# Patient Record
Sex: Female | Born: 1980 | Hispanic: No | Marital: Married | Smoking: Current every day smoker
Health system: Southern US, Community
[De-identification: ages and names within clinical notes are randomized; demographics above are authoritative.]

## PROBLEM LIST (undated history)

## (undated) ENCOUNTER — Inpatient Hospital Stay (HOSPITAL_COMMUNITY): Payer: Self-pay

## (undated) DIAGNOSIS — E669 Obesity, unspecified: Secondary | ICD-10-CM

## (undated) DIAGNOSIS — G4730 Sleep apnea, unspecified: Secondary | ICD-10-CM

## (undated) DIAGNOSIS — G44009 Cluster headache syndrome, unspecified, not intractable: Secondary | ICD-10-CM

## (undated) DIAGNOSIS — E559 Vitamin D deficiency, unspecified: Secondary | ICD-10-CM

## (undated) DIAGNOSIS — R7303 Prediabetes: Secondary | ICD-10-CM

## (undated) DIAGNOSIS — I10 Essential (primary) hypertension: Secondary | ICD-10-CM

## (undated) DIAGNOSIS — F419 Anxiety disorder, unspecified: Secondary | ICD-10-CM

## (undated) DIAGNOSIS — M7989 Other specified soft tissue disorders: Secondary | ICD-10-CM

## (undated) DIAGNOSIS — F32A Depression, unspecified: Secondary | ICD-10-CM

## (undated) DIAGNOSIS — K219 Gastro-esophageal reflux disease without esophagitis: Secondary | ICD-10-CM

## (undated) DIAGNOSIS — R0683 Snoring: Secondary | ICD-10-CM

## (undated) DIAGNOSIS — M549 Dorsalgia, unspecified: Secondary | ICD-10-CM

## (undated) DIAGNOSIS — L309 Dermatitis, unspecified: Secondary | ICD-10-CM

## (undated) DIAGNOSIS — E282 Polycystic ovarian syndrome: Secondary | ICD-10-CM

## (undated) DIAGNOSIS — O24419 Gestational diabetes mellitus in pregnancy, unspecified control: Secondary | ICD-10-CM

## (undated) DIAGNOSIS — R0602 Shortness of breath: Secondary | ICD-10-CM

## (undated) DIAGNOSIS — M5430 Sciatica, unspecified side: Secondary | ICD-10-CM

## (undated) DIAGNOSIS — M1990 Unspecified osteoarthritis, unspecified site: Secondary | ICD-10-CM

## (undated) DIAGNOSIS — M2550 Pain in unspecified joint: Secondary | ICD-10-CM

## (undated) DIAGNOSIS — E785 Hyperlipidemia, unspecified: Secondary | ICD-10-CM

## (undated) DIAGNOSIS — R079 Chest pain, unspecified: Secondary | ICD-10-CM

## (undated) DIAGNOSIS — F329 Major depressive disorder, single episode, unspecified: Secondary | ICD-10-CM

## (undated) HISTORY — DX: Vitamin D deficiency, unspecified: E55.9

## (undated) HISTORY — DX: Chest pain, unspecified: R07.9

## (undated) HISTORY — DX: Pain in unspecified joint: M25.50

## (undated) HISTORY — DX: Polycystic ovarian syndrome: E28.2

## (undated) HISTORY — DX: Unspecified osteoarthritis, unspecified site: M19.90

## (undated) HISTORY — DX: Gastro-esophageal reflux disease without esophagitis: K21.9

## (undated) HISTORY — DX: Hyperlipidemia, unspecified: E78.5

## (undated) HISTORY — DX: Essential (primary) hypertension: I10

## (undated) HISTORY — DX: Major depressive disorder, single episode, unspecified: F32.9

## (undated) HISTORY — DX: Depression, unspecified: F32.A

## (undated) HISTORY — DX: Dorsalgia, unspecified: M54.9

## (undated) HISTORY — DX: Obesity, unspecified: E66.9

## (undated) HISTORY — DX: Cluster headache syndrome, unspecified, not intractable: G44.009

## (undated) HISTORY — PX: DENTAL SURGERY: SHX609

## (undated) HISTORY — DX: Gestational diabetes mellitus in pregnancy, unspecified control: O24.419

## (undated) HISTORY — DX: Shortness of breath: R06.02

## (undated) HISTORY — PX: WISDOM TOOTH EXTRACTION: SHX21

## (undated) HISTORY — DX: Sleep apnea, unspecified: G47.30

## (undated) HISTORY — DX: Other specified soft tissue disorders: M79.89

## (undated) HISTORY — DX: Prediabetes: R73.03

## (undated) HISTORY — DX: Snoring: R06.83

## (undated) HISTORY — PX: COLPOSCOPY: SHX161

## (undated) SURGERY — Surgical Case
Anesthesia: *Unknown

---
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
## 2009-10-11 ENCOUNTER — Emergency Department (HOSPITAL_COMMUNITY): Admission: EM | Admit: 2009-10-11 | Discharge: 2009-10-12 | Payer: Self-pay | Admitting: Emergency Medicine

## 2009-10-11 IMAGING — US US OB COMP LESS 14 WK
1 series · 14 of 28 positions shown · non-contrast
Comparison: None.

CLINICAL DATA: 29-year-old female with cramping.  Estimated
gestational age 11 weeks 2 days by LMP.

OBSTETRIC <14 WK US AND TRANSVAGINAL OB US
TECHNIQUE: Both transabdominal and transvaginal ultrasound
examinations were performed for complete evaluation of the
gestation as well as the maternal uterus, adnexal regions, and
pelvic cul-de-sac.  Transvaginal technique was performed to assess
early pregnancy.

[Series 1: us ob comp less 14 wk · 0.23mm/px · 14 of 36 slices shown]
[im 2/36]
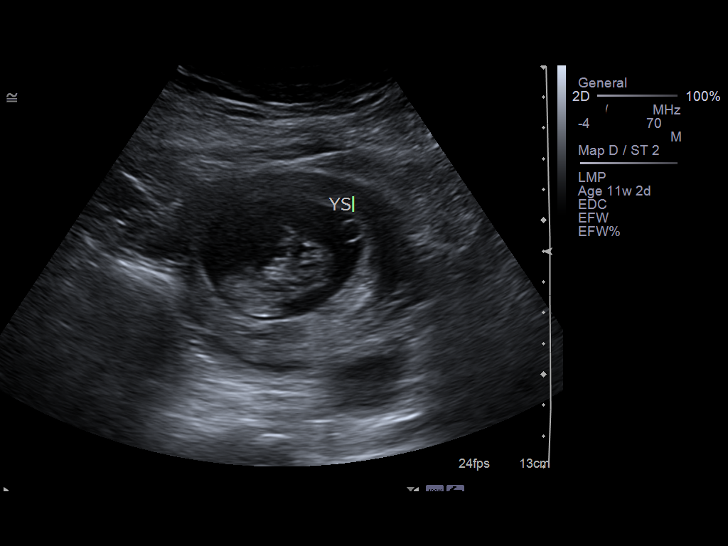
[im 4/36]
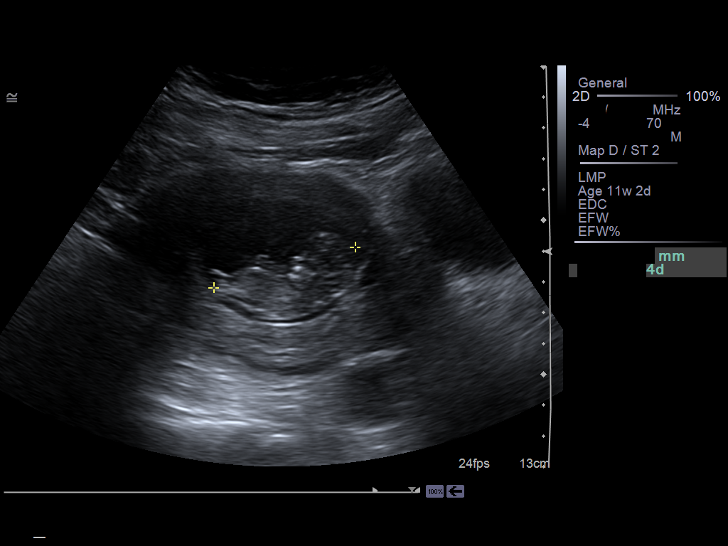
[im 7/36]
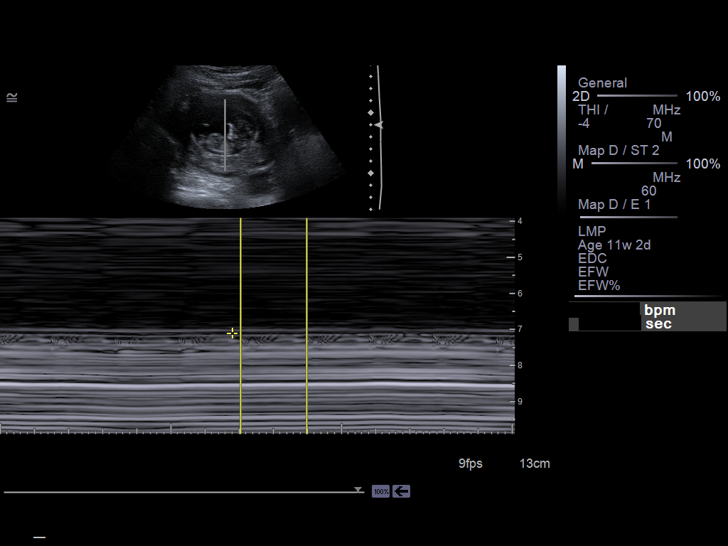
[im 10/36]
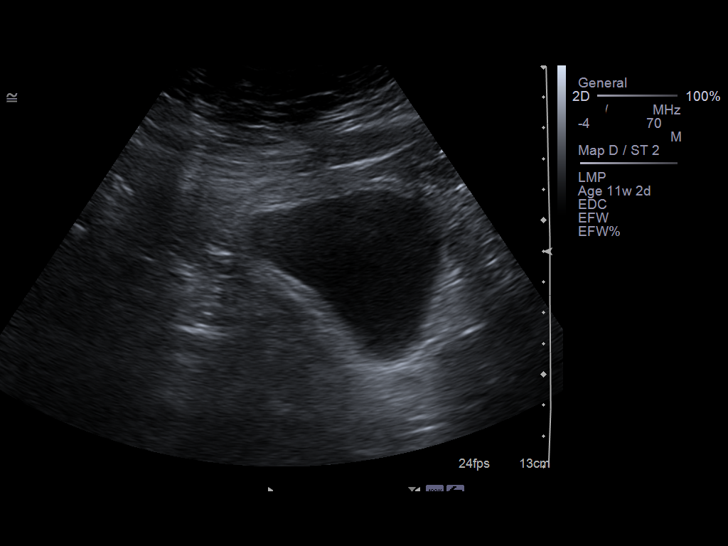
[im 12/36]
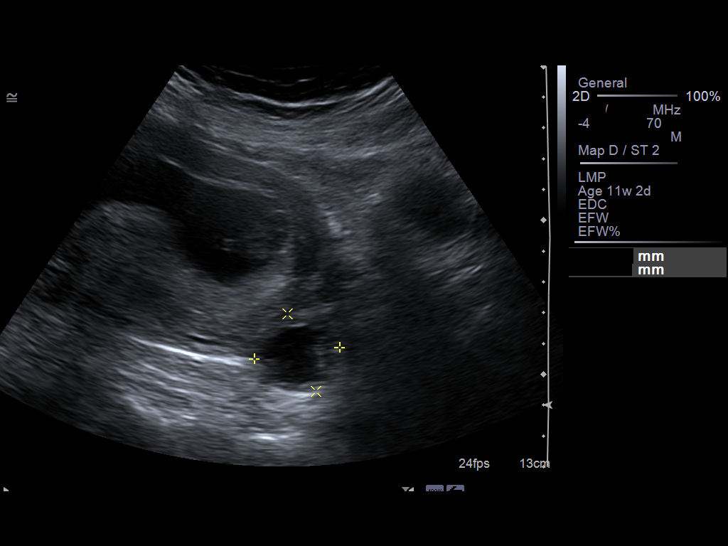
[im 15/36]
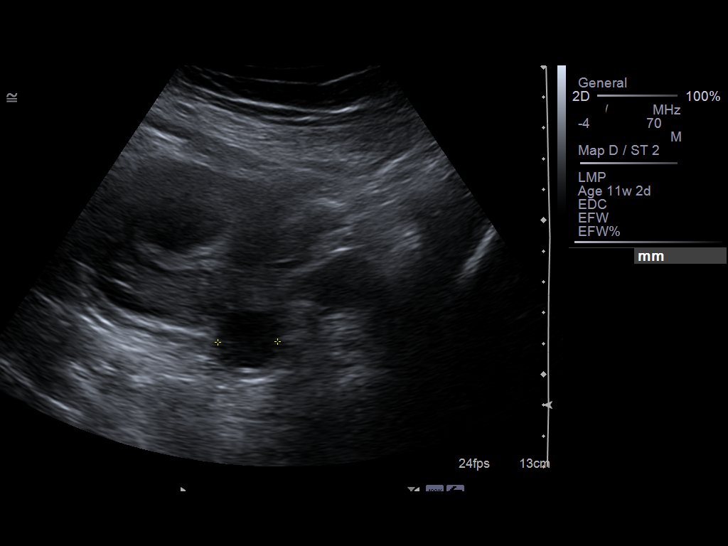
[im 17/36]
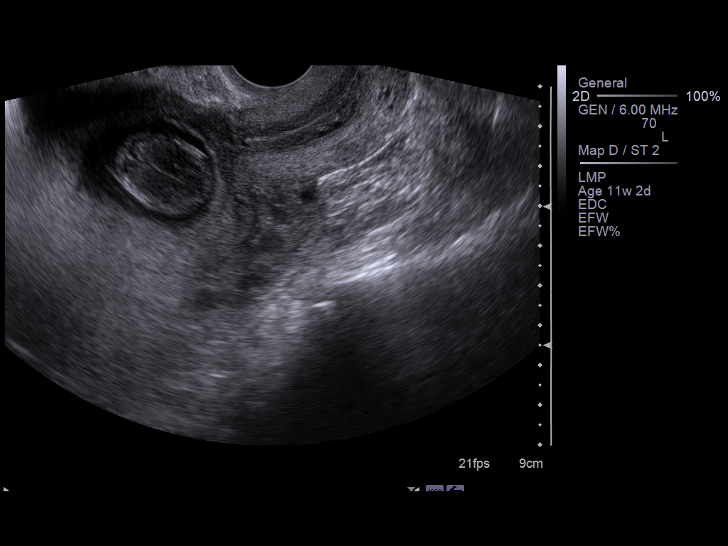
[im 20/36]
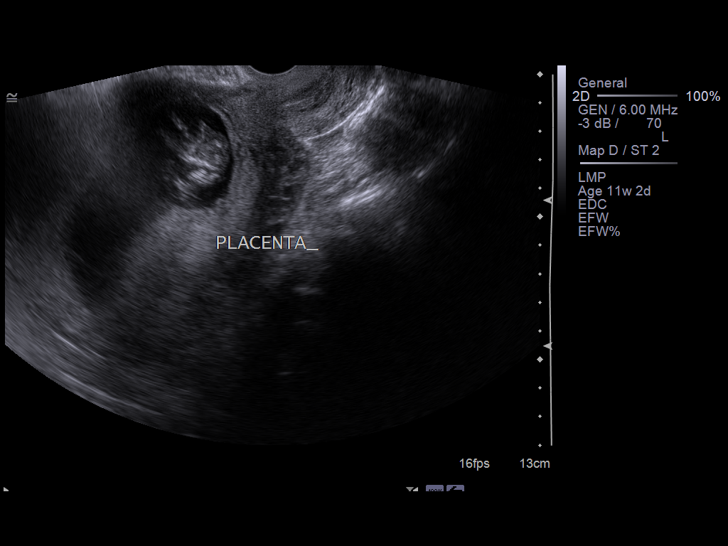
[im 23/36]
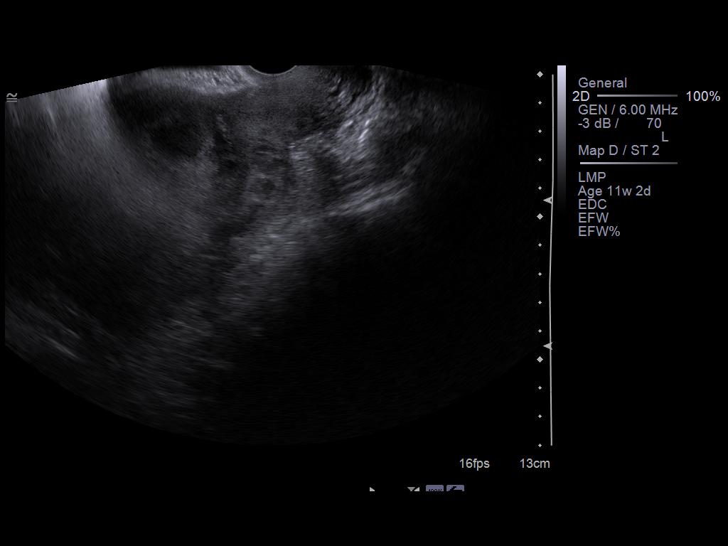
[im 25/36]
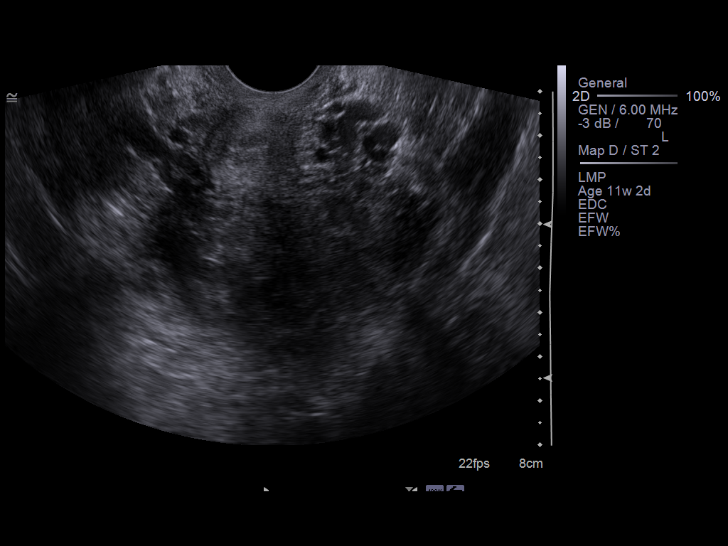
[im 28/36]
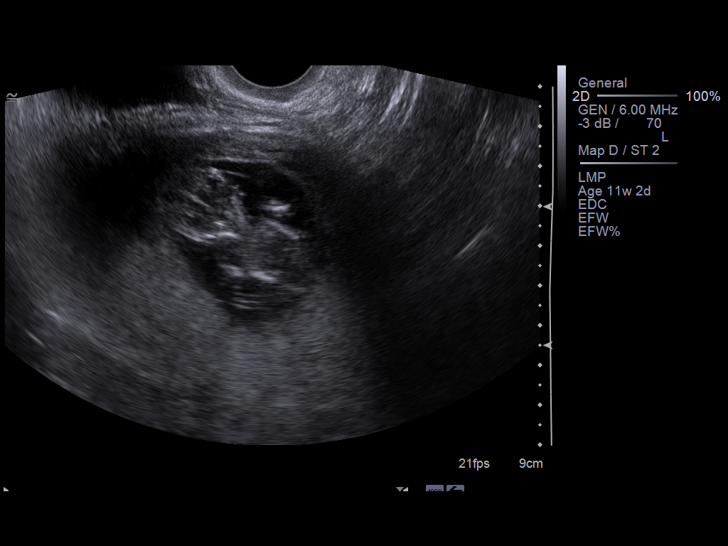
[im 30/36]
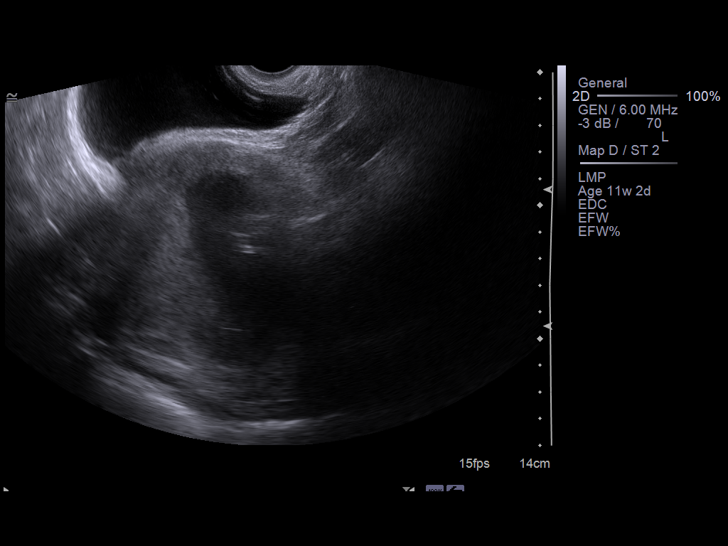
[im 33/36]
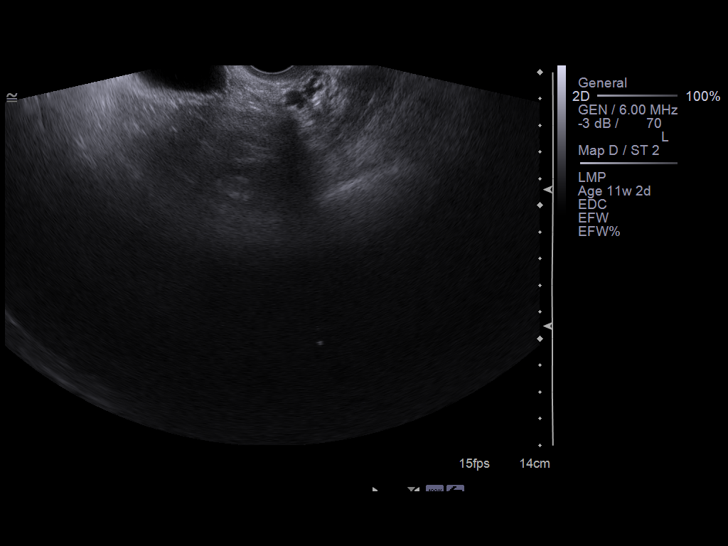
[im 36/36]
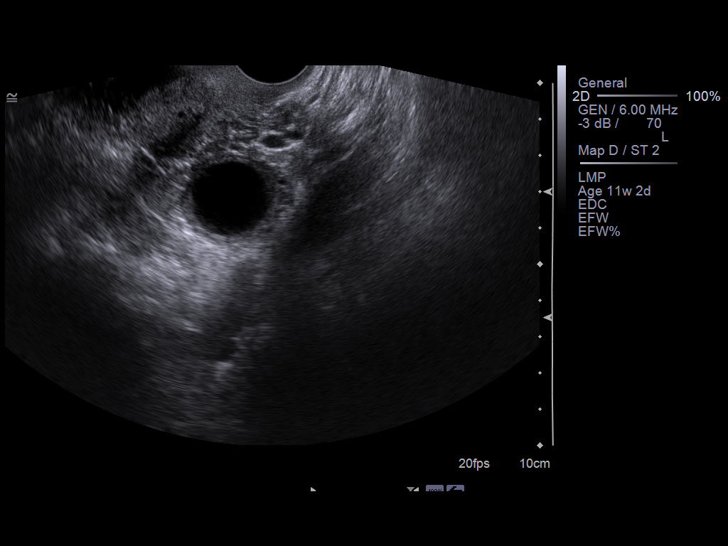

[14 of 28 positions shown; findings below may reference images not displayed]

Intrauterine gestational sac:  Single
Yolk sac: Present
Embryo: Present
Cardiac Activity: Present
Heart Rate: 158 bpm

CRL: 48.6   mm  11   w  4   d

Maternal uterus/adnexae:
No pelvic free fluid.  No subchorionic hemorrhage identified.  Low-
lying placenta.  Left ovary contains a small cyst measuring 20 mm
in diameter.  Left adnexa 28 x 27 x 27 mm.  Right ovary not
visualized despite transabdominal and transvaginal imaging.
IMPRESSION: 1. Appearance strongly suggests placenta previa.
2. Viable singleton intrauterine pregnancy with estimated
gestational age of 11 weeks and 4 days by crown-rump length.

## 2009-12-12 ENCOUNTER — Inpatient Hospital Stay (HOSPITAL_COMMUNITY)
Admission: AD | Admit: 2009-12-12 | Discharge: 2009-12-13 | Payer: Self-pay | Source: Home / Self Care | Admitting: Obstetrics and Gynecology

## 2010-01-13 ENCOUNTER — Ambulatory Visit: Payer: Self-pay | Admitting: Cardiology

## 2010-01-19 ENCOUNTER — Ambulatory Visit: Admission: RE | Admit: 2010-01-19 | Discharge: 2010-01-19 | Payer: Self-pay | Source: Home / Self Care

## 2010-01-19 ENCOUNTER — Encounter: Payer: Self-pay | Admitting: Cardiology

## 2010-01-19 ENCOUNTER — Ambulatory Visit (HOSPITAL_COMMUNITY)
Admission: RE | Admit: 2010-01-19 | Discharge: 2010-01-19 | Payer: Self-pay | Source: Home / Self Care | Attending: Cardiology | Admitting: Cardiology

## 2010-03-15 ENCOUNTER — Encounter: Payer: Medicaid Other | Attending: Obstetrics and Gynecology

## 2010-03-15 DIAGNOSIS — O99810 Abnormal glucose complicating pregnancy: Secondary | ICD-10-CM | POA: Insufficient documentation

## 2010-03-15 DIAGNOSIS — Z713 Dietary counseling and surveillance: Secondary | ICD-10-CM | POA: Insufficient documentation

## 2010-03-21 LAB — URINALYSIS, ROUTINE W REFLEX MICROSCOPIC
Glucose, UA: NEGATIVE mg/dL
Protein, ur: NEGATIVE mg/dL

## 2010-03-21 LAB — CBC
Hemoglobin: 11.8 g/dL — ABNORMAL LOW (ref 12.0–15.0)
MCHC: 34.2 g/dL (ref 30.0–36.0)
Platelets: 209 10*3/uL (ref 150–400)
RDW: 13.9 % (ref 11.5–15.5)

## 2010-03-21 LAB — COMPREHENSIVE METABOLIC PANEL
ALT: 13 U/L (ref 0–35)
AST: 15 U/L (ref 0–37)
Albumin: 3 g/dL — ABNORMAL LOW (ref 3.5–5.2)
CO2: 25 mEq/L (ref 19–32)
Calcium: 8.9 mg/dL (ref 8.4–10.5)
GFR calc Af Amer: >60 mL/min (ref >60)
Sodium: 134 mEq/L — ABNORMAL LOW (ref 135–145)
Total Protein: 5.9 g/dL — ABNORMAL LOW (ref 6.0–8.3)

## 2010-03-21 LAB — TSH: TSH: 4.562 u[IU]/mL — ABNORMAL HIGH (ref 0.350–4.500)

## 2010-03-23 LAB — GC/CHLAMYDIA PROBE AMP, GENITAL
Chlamydia, DNA Probe: NEGATIVE
GC Probe Amp, Genital: NEGATIVE

## 2010-03-23 LAB — URINALYSIS, ROUTINE W REFLEX MICROSCOPIC
Bilirubin Urine: NEGATIVE
Glucose, UA: NEGATIVE mg/dL
Hgb urine dipstick: NEGATIVE
Ketones, ur: NEGATIVE mg/dL
pH: 6 (ref 5.0–8.0)

## 2010-03-23 LAB — WET PREP, GENITAL: Trich, Wet Prep: NONE SEEN

## 2010-04-18 ENCOUNTER — Other Ambulatory Visit (HOSPITAL_COMMUNITY): Payer: Medicaid Other

## 2010-04-20 ENCOUNTER — Encounter (HOSPITAL_COMMUNITY)
Admission: RE | Admit: 2010-04-20 | Discharge: 2010-04-20 | Disposition: A | Discharge status: Home / Self Care | Payer: Medicaid Other | Source: Ambulatory Visit | Attending: Obstetrics and Gynecology | Admitting: Obstetrics and Gynecology

## 2010-04-20 LAB — BASIC METABOLIC PANEL
Chloride: 102 mEq/L (ref 96–112)
GFR calc non Af Amer: >60 mL/min (ref >60)
Glucose, Bld: 90 mg/dL (ref 70–99)
Potassium: 3.8 mEq/L (ref 3.5–5.1)
Sodium: 133 mEq/L — ABNORMAL LOW (ref 135–145)

## 2010-04-20 LAB — SURGICAL PCR SCREEN: Staphylococcus aureus: NEGATIVE

## 2010-04-20 LAB — CBC
MCH: 30.6 pg (ref 26.0–34.0)
MCHC: 32.8 g/dL (ref 30.0–36.0)
MCV: 93.3 fL (ref 78.0–100.0)
RDW: 14.8 % (ref 11.5–15.5)

## 2010-04-20 LAB — RPR: RPR Ser Ql: NONREACTIVE

## 2010-04-25 ENCOUNTER — Inpatient Hospital Stay (HOSPITAL_COMMUNITY)
Admission: RE | Admit: 2010-04-25 | Discharge: 2010-04-28 | DRG: 765 | Disposition: A | Discharge status: Home / Self Care | Payer: Medicaid Other | Source: Ambulatory Visit | Attending: Obstetrics and Gynecology | Admitting: Obstetrics and Gynecology

## 2010-04-25 ENCOUNTER — Inpatient Hospital Stay (HOSPITAL_COMMUNITY)
Admission: AD | Admit: 2010-04-25 | Payer: Medicaid Other | Source: Ambulatory Visit | Admitting: Obstetrics and Gynecology

## 2010-04-25 ENCOUNTER — Other Ambulatory Visit: Payer: Self-pay | Admitting: Obstetrics and Gynecology

## 2010-04-25 DIAGNOSIS — O34219 Maternal care for unspecified type scar from previous cesarean delivery: Principal | ICD-10-CM | POA: Diagnosis present

## 2010-04-25 DIAGNOSIS — O1002 Pre-existing essential hypertension complicating childbirth: Secondary | ICD-10-CM | POA: Diagnosis present

## 2010-04-25 DIAGNOSIS — Z01812 Encounter for preprocedural laboratory examination: Secondary | ICD-10-CM

## 2010-04-25 DIAGNOSIS — O9903 Anemia complicating the puerperium: Secondary | ICD-10-CM | POA: Diagnosis not present

## 2010-04-25 DIAGNOSIS — Z01818 Encounter for other preprocedural examination: Secondary | ICD-10-CM

## 2010-04-25 DIAGNOSIS — D649 Anemia, unspecified: Secondary | ICD-10-CM | POA: Diagnosis not present

## 2010-04-25 DIAGNOSIS — O99814 Abnormal glucose complicating childbirth: Secondary | ICD-10-CM | POA: Diagnosis present

## 2010-04-25 LAB — TYPE AND SCREEN
ABO/RH(D): O POS
Antibody Screen: NEGATIVE

## 2010-04-25 LAB — ABO/RH: ABO/RH(D): O POS

## 2010-04-26 LAB — CBC
Hemoglobin: 9.6 g/dL — ABNORMAL LOW (ref 12.0–15.0)
MCH: 30.1 pg (ref 26.0–34.0)
MCV: 94 fL (ref 78.0–100.0)
RBC: 3.19 MIL/uL — ABNORMAL LOW (ref 3.87–5.11)

## 2010-05-02 NOTE — H&P (Signed)
NAME:  [name], [name]                 ACCOUNT NO.:  [id number]  MEDICAL RECORD NO.:  [id number]           PATIENT TYPE:  LOCATION:                                 FACILITY:  PHYSICIAN:  Korri Ask A. Areya Lemmerman, M.D. DATE OF BIRTH:  [birth year]  DATE OF ADMISSION:  04/25/2010 DATE OF DISCHARGE:                             HISTORY & PHYSICAL   HISTORY:  The patient is a 30 year old gravida 2, para 1-0-0-1, at 39 weeks and 1 day, who was admitted for a repeat low transverse C- section. She has ben followed by the MD service at [facility] with Dr. Normand [name]  as her Primary.  Pregnancy significant for: 1. Previous C-section for HSV-2 outbreak. 2. History of chronic hypertension. 3. Migraines. 4. Gestational diabetes.    MEDICAL HISTORY:  The patient started menstruating at 30 years of age. She has 28-30 days cycles, first day of her last menstrual period was July 24, 2009, giving her due date of May 01, 2010, consistent withan 11-week 4-day ultrasound.  She has a history of abnormal Pap with normal colposcopy, HSV-2, ovarian cysts, pyelonephritis and migraines.  SURGICAL HISTORY:  Positive for C-section in 2009 for HSV-2 outbreak. The patient had a ganglion cyst removed from her left hand in 1998.  FAMILY HISTORY:  Positive for heart disease, chronic hypertension, emphysema, diabetes, thyroid dysfunction, chronic renal disease, seizures, AIDS, lung cancer, liver cancer, depression, alcohol and drug addiction.  GENETIC HISTORY:  Positive for autism, twins, bipolar disorder.  PREGNANCY HISTORY:   1. In March 2009, the patient had a primary low transverse C-section at 39 weeks, delivering an 8-pound 4-ounce female, C-section, indication was HSV-2 outbreak. She had no complications. 2. Current pregnancy is her second pregnancy.  ALLERGIES:  The patient is allergic to DOXYCYCLINE causes nausea and vomiting.  She is allergic to CODEINE causes stomach pain.  SOCIAL HISTORY:  The. Patient is a  single, biracial female.  She is a former smoker.  She denies alcohol and drug use.  She has a bachelor's degree and is unemployed.  Her fiance has his associate's degree and is unemployed.  ROI: Negative  OBJECTIVE:  Prenatal labs are as follows:  Hemoglobin 12.5, hematocrit 37.3, platelets 210,000.  Blood type O+.  Antibody negative. Toxoplasmosis negative.  RPR nonreactive.  Rubella immune.  Hepatitis B negative.  HIV nonreactive.  Pap normal.  Gonorrhea and Chlamydia negative.  Quad screen negative.  Varicella equivocal and negative IgM. Parvovirus immune.  One-hour glucose tolerance test 214.  Three-hour glucose tolerance test was diagnostic of gestational diabetes.  PHYSICAL EXAM: GENERAL: NAD. A&O x 3 HEART: RRR LUNGS: CTAB ABDOMEN: Soft, NT, gravid, obese, S=D PELVIC:Deferred EXTREMITIES: 1+ edema, DTRs 2+, No Clonus  ASSESSMENT: 1. A 39 and 1-week intrauterine pregnancy. 2. Previous C-section, desires repeat with possible tubal ligation.  PLAN:  Admit for repeat low transverse C-section per consult Dr. Normand [name].  Routine preop orders.     Dorathy Kinsman, CNM   ______________________________ Pierre Bali [name], M.D.    VS/MEDQ  D:  04/24/2010  T:  04/25/2010  Job:  [id number]  Electronically Signed by Dorathy Kinsman  on 04/25/2010 02:58:18 PM Electronically Signed by Jaymes Graff M.D. on 05/02/2010 04:24:32 PM

## 2010-05-02 NOTE — Discharge Summary (Signed)
  NAME:  [name], [name]                 ACCOUNT NO.:  [id number]  MEDICAL RECORD NO.:  [id number]           PATIENT TYPE:  I  LOCATION:  9114                          [facility]ACILITY:  [facility]  PHYSICIAN:  Brier Reid A. Eder Macek, M.D. DATE OF BIRTH:  [birth year]  DATE OF ADMISSION:  04/25/2010 DATE OF DISCHARGE:  04/28/2010                              DISCHARGE SUMMARY   ADMITTING DIAGNOSES: 1. The patient is a 30 year old gravida 2, para 1-0-0 1 at 39 weeks     and 1 day. 2. History of low transverse cesarean section and desires repeat     cesarean section. 3. HSV-2 positive. 4. History of chronic hypertension. 5. Migraines. 6. Gestational diabetes, diet controlled.  DISCHARGE DIAGNOSES: 1. Status post low transverse cesarean section. 2. HSV-2. 3. History of chronic hypertension. 4. Migraines. 5. Gestational diabetes, diet controlled. 6. Abdominal pelvic adhesions.  HOSPITAL PROCEDURES:  The patient received spinal anesthesia and a low transverse cesarean section.  FINDINGS:  Viable female infant weight of 7 pounds 12 ounces, Apgars of 8 amd 9.  HOSPITAL COURSE:  The patient has followed a routine postoperative course without complications.  Estimated blood loss 1000 mL.  LABORATORY DATA:  Fasting CBG on April 27, 2010, was 111.  On April 20, 2010, RPR was nonreactive.  White blood cells were 9.1, hemoglobin 12.8, hematocrit 39.0, platelets of 181,000.  On April 26, 2010, white blood cells were 9.8, hemoglobin 9.6, hematocrit 30.0 and platelets of 146,000.  The patient is breast-feeding well with lactation support.  The patient's status upon discharge is stable.  Discharge instructions per [facility] handout also to include stool softeners p.r.n.  DISCHARGE MEDICATIONS: 1. Motrin 600 mg 1 tablet q.6 h. p.r.n. as needed for pain. 2. Percocet 5/325 mg p.o. q.4 h 1 tablet as needed for pain. 3. Prenatal vitamin 1 tablet daily. 4. Ferrous sulfate 325 mg 1 tablet daily. 5. The patient desires  OCP, will prescribe Micronor.  DISCHARGE FOLLOWUP:  At [facility] in 6 weeks.    ______________________________ Sanda Klein, CNM   ______________________________ Pierre Bali. Normand Sloop, M.D.    SL/MEDQ  D:  04/28/2010  T:  04/28/2010  Job:  [id number]  Electronically Signed by Sanda Klein CNM on 04/29/2010 03:00:58 PM Electronically Signed by Jaymes Graff M.D. on 05/02/2010 04:25:51 PM

## 2010-05-02 NOTE — Op Note (Signed)
NAME:  [name]                 ACCOUNT NO.:  [id number]  MEDICAL RECORD NO.:  [id number]           PATIENT TYPE:  I  LOCATION:  9114                          [facility]ACILITY:  [facility]  PHYSICIAN:  Lavonia Eager A. Pietra Zuluaga, M.D. DATE OF BIRTH:  [birth year]  DATE OF PROCEDURE:  04/25/2010 DATE OF DISCHARGE:                              OPERATIVE REPORT   PREOPERATIVE DIAGNOSES:  Intrauterine pregnancy at term, gestational diabetes diet controlled, and history of a previous C-section, desires repeat C-section.  The  POSTOPERATIVE DIAGNOSES:  Intrauterine pregnancy at term, gestational diabetes diet controlled, and history of a previous C-section, desires repeat C-section with abdominal pelvic adhesions.  PROCEDURE:  Repeat cesarean section with lysis of adhesions.  SURGEON:  Shannon Kirkendall A. Taiya Nutting, MD.  ASSISTANTS: 1. Dorathy Kinsman, CNM 2. Anabel Halon, [facility], CNM  ANESTHESIA:  Spinal.  FINDINGS:  Female infant in vertex presentation with clear fluid. Apgars were 8 and 9.  Weight was 7 pounds and 2 ounces.  There was several uterine bladder adhesions and right side wall uterine adhesions.  SPECIMENS:  Placenta sent to Pathology.  ESTIMATED BLOOD LOSS:  1000 mL.  URINE OUTPUT:  500 mL, crystalloid was 3000 mL.  COMPLICATIONS:  There were no complications.  The patient to PACU in stable condition.  PROCEDURE IN DETAIL:  The patient was taken to the operating room where she was given spinal anesthesia, placed in dorsal supine position with a left lateral tilt.  Once anesthesia was found to be adequate, a Pfannenstiel skin incision was made with a scalpel and carried down to the fascia using Bovie cautery.  The fascia was incised in the midline and extended bilaterally.  Kochers x2 were placed and the superior aspect of the fascia was dissected off the rectus muscle both sharply and blunting.  The inferior aspect of the fascia was dissected in a similar fashion.  The muscles were separated in  the midline.  Peritoneum was identified, tented up, and entered sharply with Metzenbaum scissors and extended superiorly and inferiorly with good visualization of bowel. The bladder was plastered to the uterine wall with slow dissection using Metzenbaum scissors.  I was able to lyse adhesions.  There was clear fluid coming from the Foley.  The bladder blade was inserted.  I was unable to make a bladder flap.  The tissue was just not pliable to do so.  A low transverse uterine incision was made with a scalpel and extended transversely bluntly.  The bag was ruptured using Alice clamps. There was clear fluid.  The infant was delivered without difficulty. Cord was clamped and cut.  The uterus was cleared of all clot and debris and the placenta.  The uterine incision was repaired with 0 Vicryl in a running locked fashion and the second layer of 0 Vicryl was used to imbricate the uterus.  There were several areas on the uterus which were found to be bleeding from the lysis of adhesions from the bladder towards the uterus.  These were all fixed with 0 Vicryl figure-of-eight sutures on a 10U.  Once hemostasis was assured, I put Surgicel on the entire  bed of the sutures including the incision.  Before that, an irrigation was done.  The patient had normal-appearing tube and ovary on the left side.  The right side could not be seen because of adhesions. I did feel it and palpated it normally.  Once the Surgicel was done, I could not have the peritoneum to close it, so I did do 3 figure-of-eight stitches in the muscle.  The muscle was then irrigated and noted to be hemostatic.  The fascia was closed with 0 Vicryl in a running fashion. Subcutaneous tissue was irrigated and noted to be hemostatic.  A 10 Jackson-Pratt drain was placed in the subcutaneous tissue.  Subcutaneous tissue was then reapproximated using 2-0 plain.  The skin was then closed with 3-0 Monocryl in a subcuticular fashion.  Sponge,  lap, and needle counts were correct.  The patient went to the recovery room in stable condition.     Piedad Standiford A. Normand Sloop, M.D.     NAD/MEDQ  D:  04/25/2010  T:  04/26/2010  Job:  [id number]  Electronically Signed by Jaymes Graff M.D. on 05/02/2010 04:25:47 PM

## 2011-01-23 LAB — HM PAP SMEAR: HM Pap smear: NORMAL

## 2011-02-16 ENCOUNTER — Ambulatory Visit (INDEPENDENT_AMBULATORY_CARE_PROVIDER_SITE_OTHER): Payer: Federal, State, Local not specified - PPO | Admitting: Internal Medicine

## 2011-02-16 ENCOUNTER — Encounter: Payer: Self-pay | Admitting: Internal Medicine

## 2011-02-16 ENCOUNTER — Other Ambulatory Visit (INDEPENDENT_AMBULATORY_CARE_PROVIDER_SITE_OTHER): Payer: Federal, State, Local not specified - PPO

## 2011-02-16 VITALS — BP 120/82 | HR 58 | Temp 98.1°F | Ht 63.0 in | Wt 258.0 lb

## 2011-02-16 DIAGNOSIS — Z8632 Personal history of gestational diabetes: Secondary | ICD-10-CM

## 2011-02-16 DIAGNOSIS — L309 Dermatitis, unspecified: Secondary | ICD-10-CM

## 2011-02-16 DIAGNOSIS — F32A Depression, unspecified: Secondary | ICD-10-CM

## 2011-02-16 DIAGNOSIS — M25569 Pain in unspecified knee: Secondary | ICD-10-CM

## 2011-02-16 DIAGNOSIS — L259 Unspecified contact dermatitis, unspecified cause: Secondary | ICD-10-CM

## 2011-02-16 DIAGNOSIS — F3289 Other specified depressive episodes: Secondary | ICD-10-CM

## 2011-02-16 DIAGNOSIS — F329 Major depressive disorder, single episode, unspecified: Secondary | ICD-10-CM | POA: Insufficient documentation

## 2011-02-16 DIAGNOSIS — M222X1 Patellofemoral disorders, right knee: Secondary | ICD-10-CM | POA: Insufficient documentation

## 2011-02-16 DIAGNOSIS — M25561 Pain in right knee: Secondary | ICD-10-CM

## 2011-02-16 LAB — HEMOGLOBIN A1C: Hgb A1c MFr Bld: 5.7 % (ref 4.6–6.5)

## 2011-02-16 MED ORDER — MELOXICAM 15 MG PO TABS: 15.0000 mg | ORAL_TABLET | Freq: Every day | ORAL | Status: DC

## 2011-02-16 MED ORDER — PAROXETINE HCL 10 MG PO TABS: 10.0000 mg | ORAL_TABLET | ORAL | Status: DC

## 2011-02-16 MED ORDER — TRIAMCINOLONE ACETONIDE 0.1 % EX LOTN: TOPICAL_LOTION | Freq: Three times a day (TID) | CUTANEOUS | Status: DC

## 2011-02-16 NOTE — Progress Notes (Signed)
Subjective:    Patient ID: [name], female    DOB: 1980-09-29, 31 y.o.   MRN: [id number]  HPI New patient to me and our practice, here today to establish care. Follows with gynecology for GYN care, no primary followup in greater than 2 years since moving to [facility] September 2011  Complains of right knee pain. Onset fall 2012. Precipitated by twisting injury while kneeling on the ground to play with kids. Associated with mild swelling after long day of standing or exertion. Pain is located on the medial and posterior side of the knee. Pain associated with popping on extension greater than flexion, no locking or falls. Unrelieved by occasional over-the-counter ibuprofen. Concerned about knee pain because of prior right ankle twisting remotely which has "never recovered"  Also complains of right lateral hip pain. No radiation down leg thigh but it or put area onset approximately 4 years ago, pain is intermittent in nature occurring 3-4 spells annually. Pain exacerbated with movement but not relieved with rest. Denies back pain or trauma  Also concerned for risk of diabetes. Notes weight loss following second child in 2009 but regained during pregnancy in 2012. Second pregnancy associated with gestational diabetes and has not been rechecked for diabetes since. Positive family history of diabetes  Complains of rash and itch on elbows and knees. Denies new exposure, pets or detergent.  Complains of depression. Working with Community education officer every 2 weeks for same Farrel Demark). Remotely on brief trial of antidepressants (celexa) x1 month prior to first pregnancy. would like to reconsider different antidepressant treatment now. Symptoms associated with physical and motivational fatigue, hypersomnia, and irritability. Reports recurring traumatic flashbacks which caused significant emotional distress. denies SI/HI.  Complains of snoring and poor sleep despite hypersomnia - "never rested"  Past  Medical History  Diagnosis Date  . Gestational diabetes   . Depression   . Cluster headaches   . GERD (gastroesophageal reflux disease)   . Hyperlipidemia   . Hypertension    Family History  Problem Relation Age of Onset  . Arthritis Mother   . Alcohol abuse Mother   . Mental illness Mother   . Arthritis Father   . Alcohol abuse Father   . Hyperlipidemia Father   . Heart disease Father   . Kidney disease Father   . Stroke Father   . Mental illness Father   . Diabetes Maternal Grandmother   . Heart disease Maternal Grandmother   . Stroke Maternal Grandfather   . Hypertension Paternal Grandfather   . Hyperlipidemia Paternal Grandfather   . Arthritis Other   . Alcohol abuse Other   . Breast cancer Other    History  Substance Use Topics  . Smoking status: Current Everyday Smoker    Types: Cigarettes  . Smokeless tobacco: Not on file   Comment: pt states she smokes 1 cigarette a day  . Alcohol Use: No    Review of Systems Constitutional: Negative for fever or unexpected weight change.  Respiratory: Negative for cough and shortness of breath.   Cardiovascular: Negative for chest pain or palpitations.  Gastrointestinal: Negative for abdominal pain, no bowel changes.  Musculoskeletal: Multiple pains along right side, see history of present illness above. Negative for gait problem or joint swelling.  Neurological: Negative for dizziness or headache.  No other specific complaints in a complete review of systems (except as listed in HPI above).     Objective:   Physical Exam BP 120/82  Pulse 58  Temp(Src) 98.1 F (  36.7 C) (Oral)  Ht 5\' 3"  (1.6 m)  Wt 258 lb (117.028 kg)  BMI 45.70 kg/m2  SpO2 97% Wt Readings from Last 3 Encounters:  02/16/11 258 lb (117.028 kg)   Constitutional: She is overweight, but appears well-developed and well-nourished. No distress.  HENT: Head: Normocephalic and atraumatic. Ears: B TMs ok, no erythema or effusion; Nose: Nose normal.    Mouth/Throat: Oropharynx is clear and moist. No oropharyngeal exudate.  Eyes: Conjunctivae and EOM are normal. Pupils are equal, round, and reactive to light. No scleral icterus.  Neck: Short and thick. Normal range of motion. Neck supple. No JVD or LAD present. No thyromegaly present.  Cardiovascular: Normal rate, regular rhythm and normal heart sounds.  No murmur heard. No BLE edema. Pulmonary/Chest: Effort normal and breath sounds normal. No respiratory distress. She has no wheezes.  Abdominal: Obese. Soft. Bowel sounds are normal. She exhibits no distension. There is no tenderness. no masses Musculoskeletal: R knee without effusion or synovitis - min tender to palpation over medial joint line; FROM and ligamentous function intact  Normal range of motion. No gross deformities. Nontender over palpation of right greater trochanteric bursa and right groin. Right wrist without deformity, full range of motion and nontender to palpation -  Neurological: She is alert and oriented to person, place, and time. No cranial nerve deficit. Coordination normal.  strength 5 out of 5 in all extremities Skin: Mild eczema at knees greater than elbows  -remaining skin is warm and dry. No other rash noted. No erythema.  Psychiatric: She has an anxious mood and affect. Her behavior is normal. Judgment and thought content normal.   Lab Results  Component Value Date   WBC 9.8 04/26/2010   HGB 9.6* 04/26/2010   HCT 30.0* 04/26/2010   PLT 146* 04/26/2010   GLUCOSE 90 04/20/2010   ALT 13 12/12/2009   AST 15 12/12/2009   NA 133* 04/20/2010   K 3.8 04/20/2010   CL 102 04/20/2010   CREATININE 0.59 04/20/2010   BUN 7 04/20/2010   CO2 24 04/20/2010   TSH 4.562* 12/12/2009       Assessment & Plan:  See problem list. Medications and labs reviewed today.  Snoring. Associated with hypersomnia and daytime fatigue. Treat other issues as listed below for depression and screen for diabetes. Consider sleep study evaluation of  possible sleep apnea

## 2011-02-16 NOTE — Assessment & Plan Note (Signed)
Triamcinolone lotion - new prescription provided

## 2011-02-16 NOTE — Assessment & Plan Note (Signed)
Classic symptoms overlap with anxiety and possible PTSD Already working with behavioral counselor for same, encourage continuation of therapy Start SSRI - generic Paxil selected after review of medication potential risks benefits and patient understands/agrees Followup in 4-6 weeks to review medications and symptoms, we'll titrate as needed or just to alternate therapy Patient agrees to call sooner if worse

## 2011-02-16 NOTE — Assessment & Plan Note (Signed)
Symptoms present x3 months following twisting injury No evidence for ligamentous damage on exam  refer to orthopedics to evaluate this and other multiple orthopedic concerns as listed above Start meloxicam daily pending orthopedic evaluation - erx done

## 2011-02-16 NOTE — Patient Instructions (Signed)
It was good to see you today. We have reviewed your prior records including labs and tests today Test(s) ordered today for diabetes. Your results will be called to you after review (48-72hours after test completion). If any changes need to be made, you will be notified at that time. meloxicam for knee and other pains, generic Paxil for MDD and anxiety symptoms, triamcinolone lotion for eczema - Your prescription(s) have been submitted to your pharmacy. Please take as directed and contact our office if you believe you are having problem(s) with the medication(s). we'll make referral to orthopedics for your multiple pain symptoms (knee, hip, ankle, wrist). Our office will contact you regarding appointment(s) once made. Please schedule followup in 6 weeks for physical/labs and medication review, call sooner if problems.

## 2011-02-16 NOTE — Assessment & Plan Note (Signed)
Gestational diabetes 2012, strong family history of diabetes Patient understands relation to insulin resistance and development of type 2 diabetes with obesity Known PCOS and may have metabolic syndrome with insulin resistance Check A1c now, begin metformin if needed

## 2011-04-04 ENCOUNTER — Encounter: Payer: Federal, State, Local not specified - PPO | Admitting: Internal Medicine

## 2011-04-11 ENCOUNTER — Encounter: Payer: Federal, State, Local not specified - PPO | Admitting: Internal Medicine

## 2011-05-17 ENCOUNTER — Encounter: Payer: Self-pay | Admitting: Internal Medicine

## 2011-05-17 ENCOUNTER — Other Ambulatory Visit: Payer: Self-pay

## 2011-05-17 ENCOUNTER — Ambulatory Visit (INDEPENDENT_AMBULATORY_CARE_PROVIDER_SITE_OTHER): Payer: Federal, State, Local not specified - PPO | Admitting: Internal Medicine

## 2011-05-17 ENCOUNTER — Other Ambulatory Visit (INDEPENDENT_AMBULATORY_CARE_PROVIDER_SITE_OTHER): Payer: Federal, State, Local not specified - PPO

## 2011-05-17 VITALS — BP 110/72 | HR 63 | Temp 98.6°F | Ht 63.0 in | Wt 266.8 lb

## 2011-05-17 DIAGNOSIS — F3289 Other specified depressive episodes: Secondary | ICD-10-CM

## 2011-05-17 DIAGNOSIS — F329 Major depressive disorder, single episode, unspecified: Secondary | ICD-10-CM

## 2011-05-17 DIAGNOSIS — F32A Depression, unspecified: Secondary | ICD-10-CM

## 2011-05-17 DIAGNOSIS — Z0000 Encounter for general adult medical examination without abnormal findings: Secondary | ICD-10-CM

## 2011-05-17 DIAGNOSIS — E669 Obesity, unspecified: Secondary | ICD-10-CM

## 2011-05-17 LAB — LIPID PANEL
Cholesterol: 188 mg/dL (ref 0–200)
LDL Cholesterol: 114 mg/dL — ABNORMAL HIGH (ref 0–99)
VLDL: 25.6 mg/dL (ref 0.0–40.0)

## 2011-05-17 LAB — CBC WITH DIFFERENTIAL/PLATELET
Basophils Absolute: 0 10*3/uL (ref 0.0–0.1)
Eosinophils Absolute: 0.1 10*3/uL (ref 0.0–0.7)
Eosinophils Relative: 1 % (ref 0.0–5.0)
HCT: 39 % (ref 36.0–46.0)
Lymphs Abs: 2 10*3/uL (ref 0.7–4.0)
MCHC: 33.7 g/dL (ref 30.0–36.0)
MCV: 86.3 fl (ref 78.0–100.0)
Monocytes Absolute: 0.3 10*3/uL (ref 0.1–1.0)
Neutrophils Relative %: 64.9 % (ref 43.0–77.0)
Platelets: 230 10*3/uL (ref 150.0–400.0)
RDW: 13.2 % (ref 11.5–14.6)

## 2011-05-17 LAB — BASIC METABOLIC PANEL
BUN: 12 mg/dL (ref 6–23)
CO2: 25 mEq/L (ref 19–32)
Chloride: 107 mEq/L (ref 96–112)
Creatinine, Ser: 0.8 mg/dL (ref 0.4–1.2)
Glucose, Bld: 87 mg/dL (ref 70–99)
Potassium: 4.2 mEq/L (ref 3.5–5.1)

## 2011-05-17 LAB — URINALYSIS
Bilirubin Urine: NEGATIVE
Hgb urine dipstick: NEGATIVE
Nitrite: NEGATIVE
Total Protein, Urine: NEGATIVE
Urobilinogen, UA: 0.2 (ref 0.0–1.0)

## 2011-05-17 LAB — HEPATIC FUNCTION PANEL
Alkaline Phosphatase: 57 U/L (ref 39–117)
Bilirubin, Direct: 0.1 mg/dL (ref 0.0–0.3)
Total Bilirubin: 0.5 mg/dL (ref 0.3–1.2)

## 2011-05-17 MED ORDER — PAROXETINE HCL 20 MG PO TABS: 20.0000 mg | ORAL_TABLET | ORAL | Status: DC

## 2011-05-17 NOTE — Assessment & Plan Note (Signed)
Classic symptoms overlap with anxiety and possible PTSD Continue working with behavioral counselor for same Started generic Paxil 02/2011 -doing well but will titrate as needed depending on CPX lab results (increase dose if CPX normal)

## 2011-05-17 NOTE — Patient Instructions (Signed)
It was good to see you today. We have reviewed your interval medical history, family history and records today Test(s) ordered today. Your results will be called to you after review (48-72hours after test completion). If any changes need to be made, you will be notified at that time. Continue meloxicam for knee and other pains, generic Paxil  - we may consider increase paxil dose if labs all normal. Please schedule followup in 3-4 months for depression review, call sooner if problems.

## 2011-05-17 NOTE — Assessment & Plan Note (Signed)
Wt Readings from Last 3 Encounters:  05/17/11 266 lb 12.8 oz (121.02 kg)  02/16/11 258 lb (117.028 kg)   Education today on importance of diet and exercise

## 2011-05-17 NOTE — Progress Notes (Signed)
Subjective:    Patient ID: [name], female    DOB: Mar 04, 1980, 31 y.o.   MRN: [id number]  HPI  patient is here today for annual physical. Patient feels well overall.  Also reviewed chronic medical issues:  right knee pain. S/p ortho eval of same 03/2011 - Onset fall 2012. Improved with meloxicam  Hx gestational diabetes 2012. Positive family history of diabetes  depression. Started generic paxil 02/2011, doing well overall -working with behavioral counselor every 2 weeks for same Farrel Demark). Remotely on brief trial of antidepressants (celexa) x1 month in 2009. Symptoms associated with physical and motivational fatigue, hypersomnia, and irritability. Reports recurring traumatic flashbacks which caused significant emotional distress. denies SI/HI.   Past Medical History  Diagnosis Date  . Gestational diabetes 2012  . Depression   . Cluster headaches   . GERD (gastroesophageal reflux disease)   . Hyperlipidemia   . Hypertension    Family History  Problem Relation Age of Onset  . Arthritis Mother   . Alcohol abuse Mother   . Mental illness Mother   . Arthritis Father   . Alcohol abuse Father   . Hyperlipidemia Father   . Heart disease Father   . Kidney disease Father   . Stroke Father   . Mental illness Father   . Diabetes Maternal Grandmother   . Heart disease Maternal Grandmother   . Stroke Maternal Grandfather   . Hypertension Paternal Grandfather   . Hyperlipidemia Paternal Grandfather   . Arthritis Other   . Alcohol abuse Other   . Breast cancer Other   . Cancer Mother 34    squamous - unknown primary  . Coronary artery disease Mother 7    MI with stent   History  Substance Use Topics  . Smoking status: Current Everyday Smoker    Types: Cigarettes  . Smokeless tobacco: Not on file   Comment: pt states she smokes 1 cigarette a day  . Alcohol Use: No    Review of Systems  Constitutional: Negative for fever or unexpected weight change.    Respiratory: Negative for cough and shortness of breath.   Cardiovascular: Negative for chest pain or palpitations.  Gastrointestinal: Negative for abdominal pain, no bowel changes.  Musculoskeletal: Negative for gait problem or joint swelling.  Neurological: Negative for dizziness or headache.  No other specific complaints in a complete review of systems (except as listed in HPI above).     Objective:   Physical Exam  BP 110/72  Pulse 63  Temp(Src) 98.6 F (37 C) (Oral)  Ht 5\' 3"  (1.6 m)  Wt 266 lb 12.8 oz (121.02 kg)  BMI 47.26 kg/m2  SpO2 98% Wt Readings from Last 3 Encounters:  05/17/11 266 lb 12.8 oz (121.02 kg)  02/16/11 258 lb (117.028 kg)   Constitutional: She is overweight, but appears well-developed and well-nourished. No distress.  HENT: Head: Normocephalic and atraumatic. Ears: B TMs ok, no erythema or effusion; Nose: Nose normal. Mouth/Throat: Oropharynx is clear and moist. No oropharyngeal exudate.  Eyes: Conjunctivae and EOM are normal. Pupils are equal, round, and reactive to light. No scleral icterus.  Neck: Short and thick. Normal range of motion. Neck supple. No JVD or LAD present. No thyromegaly present.  Cardiovascular: Normal rate, regular rhythm and normal heart sounds.  No murmur heard. No BLE edema. Pulmonary/Chest: Effort normal and breath sounds normal. No respiratory distress. She has no wheezes.  Abdominal: Obese. Soft. Bowel sounds are normal. She exhibits no distension. There is no  tenderness. no masses Musculoskeletal: no gross deformities Neurological: She is alert and oriented to person, place, and time. No cranial nerve deficit. Coordination, gait, speech and recall normal.   Skin: skin is warm and dry. No other rash noted. No erythema.  Psychiatric: She has a mildly anxious mood and affect. Her behavior is normal. Judgment and thought content normal.   Lab Results  Component Value Date   WBC 9.8 04/26/2010   HGB 9.6* 04/26/2010   HCT 30.0*  04/26/2010   PLT 146* 04/26/2010   GLUCOSE 90 04/20/2010   ALT 13 12/12/2009   AST 15 12/12/2009   NA 133* 04/20/2010   K 3.8 04/20/2010   CL 102 04/20/2010   CREATININE 0.59 04/20/2010   BUN 7 04/20/2010   CO2 24 04/20/2010   TSH 4.562* 12/12/2009   HGBA1C 5.7 02/16/2011       Assessment & Plan:  CPX/v70.0 - Patient has been counseled on age-appropriate routine health concerns for screening and prevention. These are reviewed and up-to-date. Immunizations are up-to-date or declined. Labs ordered and will be reviewed.

## 2011-07-03 ENCOUNTER — Encounter: Payer: Federal, State, Local not specified - PPO | Admitting: Internal Medicine

## 2011-08-22 ENCOUNTER — Encounter: Payer: Self-pay | Admitting: Internal Medicine

## 2011-08-22 ENCOUNTER — Ambulatory Visit (INDEPENDENT_AMBULATORY_CARE_PROVIDER_SITE_OTHER): Payer: Federal, State, Local not specified - PPO | Admitting: Internal Medicine

## 2011-08-22 VITALS — BP 124/70 | HR 68 | Temp 98.4°F | Ht 63.0 in | Wt 260.4 lb

## 2011-08-22 DIAGNOSIS — E669 Obesity, unspecified: Secondary | ICD-10-CM

## 2011-08-22 DIAGNOSIS — R0609 Other forms of dyspnea: Secondary | ICD-10-CM

## 2011-08-22 DIAGNOSIS — F32A Depression, unspecified: Secondary | ICD-10-CM

## 2011-08-22 DIAGNOSIS — R0989 Other specified symptoms and signs involving the circulatory and respiratory systems: Secondary | ICD-10-CM

## 2011-08-22 DIAGNOSIS — G4710 Hypersomnia, unspecified: Secondary | ICD-10-CM

## 2011-08-22 DIAGNOSIS — F329 Major depressive disorder, single episode, unspecified: Secondary | ICD-10-CM

## 2011-08-22 DIAGNOSIS — F3289 Other specified depressive episodes: Secondary | ICD-10-CM

## 2011-08-22 DIAGNOSIS — R0683 Snoring: Secondary | ICD-10-CM

## 2011-08-22 NOTE — Patient Instructions (Signed)
It was good to see you today. We have reviewed your interval medical history, family history and records today Medications reviewed, no changes at this time. we'll make referral to sleep specialists. Our office will contact you regarding appointment(s) once made. Please schedule followup in 6 months for depression review, call sooner if problems.

## 2011-08-22 NOTE — Progress Notes (Signed)
  Subjective:    Patient ID: [name], female    DOB: 1980/10/09, 31 y.o.   MRN: [id number]  HPI  Continued fatigue despite increase SSRI Spouse reports increased pt snoring and "pauses" -  Chronic sleep problems complicated by nightmares, hypersomnia Would like sleep eval for same  Also reviewed chronic medical issues:  right knee pain. S/p ortho eval of same 03/2011 - Onset fall 2012. Improved with meloxicam  Hx gestational diabetes 2012. Positive family history of diabetes  depression. Started generic paxil 02/2011, dose increased 05/2011 -doing well overall - was working with behavioral counselor every 2 weeks for same Farrel Demark). Remotely on brief trial of antidepressants (celexa) x1 month in 2009. Symptoms associated with physical and motivational fatigue, hypersomnia, and irritability. Reports recurring traumatic flashbacks which caused significant emotional distress. denies SI/HI.  Past Medical History  Diagnosis Date  . Gestational diabetes 2012  . Depression   . Cluster headaches   . GERD (gastroesophageal reflux disease)   . Hyperlipidemia   . Hypertension     Review of Systems  Constitutional: Negative for fever or unexpected weight change.  Respiratory: Negative for cough and shortness of breath.   Cardiovascular: Negative for chest pain or palpitations.      Objective:   Physical Exam  BP 124/70  Pulse 68  Temp 98.4 F (36.9 C) (Oral)  Ht 5\' 3"  (1.6 m)  Wt 260 lb 6.4 oz (118.117 kg)  BMI 46.13 kg/m2  SpO2 97% Wt Readings from Last 3 Encounters:  08/22/11 260 lb 6.4 oz (118.117 kg)  05/17/11 266 lb 12.8 oz (121.02 kg)  02/16/11 258 lb (117.028 kg)   Constitutional: She is overweight, but appears well-developed and well-nourished. No distress. Neck: Short and thick. Normal range of motion. Neck supple. No JVD or LAD present. No thyromegaly present.  Cardiovascular: Normal rate, regular rhythm and normal heart sounds.  No murmur heard. No BLE  edema. Pulmonary/Chest: Effort normal and breath sounds normal. No respiratory distress. She has no wheezes.  Psychiatric: She has a mildly anxious mood and affect. Her behavior is normal. Judgment and thought content normal.   Lab Results  Component Value Date   WBC 6.8 05/17/2011   HGB 13.1 05/17/2011   HCT 39.0 05/17/2011   PLT 230.0 05/17/2011   GLUCOSE 87 05/17/2011   CHOL 188 05/17/2011   TRIG 128.0 05/17/2011   HDL 48.80 05/17/2011   LDLCALC 114* 05/17/2011   ALT 16 05/17/2011   AST 16 05/17/2011   NA 141 05/17/2011   K 4.2 05/17/2011   CL 107 05/17/2011   CREATININE 0.8 05/17/2011   BUN 12 05/17/2011   CO2 25 05/17/2011   TSH 1.54 05/17/2011   HGBA1C 5.7 02/16/2011       Assessment & Plan:   Snoring and hypersomnia Sleep problem/chronic insomnia Obesity  Refer to pulm for sleep evaluation rule out OSA or other sleep problem Note sister with "UARS"

## 2011-08-22 NOTE — Assessment & Plan Note (Signed)
Classic symptoms overlap with anxiety and possible PTSD Continue working with behavioral counselor for same Started generic Paxil 02/2011, dose increased 05/2011 The current medical regimen is effective;  continue present plan and medications.

## 2011-09-18 ENCOUNTER — Institutional Professional Consult (permissible substitution): Payer: Federal, State, Local not specified - PPO | Admitting: Pulmonary Disease

## 2011-09-24 ENCOUNTER — Institutional Professional Consult (permissible substitution): Payer: Federal, State, Local not specified - PPO | Admitting: Pulmonary Disease

## 2011-10-17 ENCOUNTER — Institutional Professional Consult (permissible substitution): Payer: Federal, State, Local not specified - PPO | Admitting: Pulmonary Disease

## 2011-11-26 ENCOUNTER — Ambulatory Visit (INDEPENDENT_AMBULATORY_CARE_PROVIDER_SITE_OTHER): Payer: Federal, State, Local not specified - PPO | Admitting: Pulmonary Disease

## 2011-11-26 ENCOUNTER — Encounter: Payer: Self-pay | Admitting: Pulmonary Disease

## 2011-11-26 VITALS — BP 132/82 | HR 72 | Temp 98.7°F | Ht 63.0 in | Wt 255.8 lb

## 2011-11-26 DIAGNOSIS — G4733 Obstructive sleep apnea (adult) (pediatric): Secondary | ICD-10-CM

## 2011-11-26 NOTE — Assessment & Plan Note (Signed)
The patient's history is very suspicious for sleep disordered breathing.  I've had a long discussion with her about sleep apnea, including its impact to her quality of life and cardiovascular health.  I think she needs to have a sleep study, and because of my high suspicion and her lack of significant medical issues, I think she would be a good candidate for home sleep testing.  The pt is agreeable to this approach.

## 2011-11-26 NOTE — Progress Notes (Signed)
  Subjective:    Patient ID: [name], female    DOB: Mar 12, 1980, 31 y.o.   MRN: [id number]  HPI The patient is a 31 year old female who I've been asked to see for possible obstructive sleep apnea.  The patient has been noted to have loud snoring, as well as an abnormal breathing pattern during sleep.  She describes frequent awakenings at night, and has not rested in the mornings upon arising.  She has significant sleep pressure during the day with periods of inactivity, especially in the late afternoons.  She will get sleepy watching television or movies in the evening, and also notes sleep pressure with driving longer distances.  The patient states that her weight is up 40 pounds over the last 2 years, and her Epworth Sleepiness Scale today is 11  Sleep Questionnaire: What time do you typically go to bed?( Between what hours) 10pm to 11 pm How long does it take you to fall asleep? 5 min How many times during the night do you wake up? What time do you get out of bed to start your day? 0630 Do you drive or operate heavy machinery in your occupation? No How much has your weight changed (up or down) over the past two years? (In pounds) 40 lb (18.144 kg) Have you ever had a sleep study before? No Do you currently use CPAP? No Do you wear oxygen at any time? No    Review of Systems  Constitutional: Positive for unexpected weight change. Negative for fever.  HENT: Positive for sneezing. Negative for ear pain, nosebleeds, congestion, sore throat, rhinorrhea, trouble swallowing, dental problem, postnasal drip and sinus pressure.   Eyes: Negative for redness and itching.  Respiratory: Positive for cough. Negative for chest tightness, shortness of breath and wheezing.   Cardiovascular: Negative for palpitations and leg swelling.  Gastrointestinal: Negative for nausea and vomiting.  Genitourinary: Negative for dysuria.  Musculoskeletal: Negative for joint swelling.  Skin: Negative for rash.    Neurological: Positive for headaches.  Hematological: Bruises/bleeds easily.  Psychiatric/Behavioral: Positive for dysphoric mood. The patient is nervous/anxious.        Objective:   Physical Exam Constitutional:  Obese female, no acute distress  HENT:  Nares patent without discharge  Oropharynx without exudate, palate and uvula are moderately elongated with side wall   narrowing.  Eyes:  Perrla, eomi, no scleral icterus  Neck:  No JVD, no TMG  Cardiovascular:  Normal rate, regular rhythm, no rubs or gallops.  No murmurs        Intact distal pulses  Pulmonary :  Normal breath sounds, no stridor or respiratory distress   No rales, rhonchi, or wheezing  Abdominal:  Soft, nondistended, bowel sounds present.  No tenderness noted.   Musculoskeletal:  minimal lower extremity edema noted.  Lymph Nodes:  No cervical lymphadenopathy noted  Skin:  No cyanosis noted  Neurologic:  Alert, appropriate, moves all 4 extremities without obvious deficit.         Assessment & Plan:

## 2011-11-26 NOTE — Patient Instructions (Addendum)
Will schedule for home sleep testing, and arrange for followup with me once results are available.

## 2011-11-29 ENCOUNTER — Encounter: Payer: Self-pay | Admitting: Internal Medicine

## 2011-11-29 ENCOUNTER — Ambulatory Visit (INDEPENDENT_AMBULATORY_CARE_PROVIDER_SITE_OTHER): Payer: Federal, State, Local not specified - PPO | Admitting: Internal Medicine

## 2011-11-29 VITALS — BP 110/82 | HR 67 | Temp 98.9°F | Ht 63.0 in | Wt 252.8 lb

## 2011-11-29 DIAGNOSIS — G4733 Obstructive sleep apnea (adult) (pediatric): Secondary | ICD-10-CM

## 2011-11-29 DIAGNOSIS — Z803 Family history of malignant neoplasm of breast: Secondary | ICD-10-CM

## 2011-11-29 DIAGNOSIS — N644 Mastodynia: Secondary | ICD-10-CM

## 2011-11-29 NOTE — Progress Notes (Signed)
  Subjective:    Patient ID: [name], female    DOB: January 09, 1981, 31 y.o.   MRN: [id number]  HPI  complains of breast pain Onset 12 d ago Located on lateral left side associated with swollen appearance precipitated by overhead stretch of L shoulder - felt "pull" followed by pain in lateral breast No bruising or rash No other mass or prior personal problem but +FH breast dz - cancer to fibrocystic  Past Medical History  Diagnosis Date  . Gestational diabetes 2012  . Depression   . Cluster headaches   . GERD (gastroesophageal reflux disease)   . Hyperlipidemia   . Hypertension   . OSA (obstructive sleep apnea)     Review of Systems  Respiratory: Negative for cough and shortness of breath.   Cardiovascular: Negative for chest pain and palpitations.  Skin: Negative for rash and wound.       Objective:   Physical Exam BP 110/82  Pulse 67  Temp 98.9 F (37.2 C) (Oral)  Ht 5\' 3"  (1.6 m)  Wt 252 lb 12.8 oz (114.669 kg)  BMI 44.78 kg/m2  SpO2 98% Wt Readings from Last 3 Encounters:  11/29/11 252 lb 12.8 oz (114.669 kg)  11/26/11 255 lb 12.8 oz (116.03 kg)  08/22/11 260 lb 6.4 oz (118.117 kg)   Constitutional: She is overweight, but appears well-developed and well-nourished. No distress. Neck: Normal range of motion. Neck supple. No JVD present. No thyromegaly present.  Breasts - B stretch marks, lateral left side with mild soft tissue fullness, tender to touch - no gross deformities - B fibrocystic changes but no mass Cardiovascular: Normal rate, regular rhythm and normal heart sounds.  No murmur heard. No BLE edema. Pulmonary/Chest: Effort normal and breath sounds normal. No respiratory distress. She has no wheezes.  Skin: Skin is warm and dry. No rash noted. No erythema.  Psychiatric: She has a normal mood and affect. Her behavior is normal. Judgment and thought content normal.        Assessment & Plan:  L lateral breast pain - suspect inflammatory from MSkel  injury/overuse FH breast ca -  Refer for dx mammo and or ultrasound as needed recommended NSAIDs and cool pak for symptoms relief

## 2011-11-29 NOTE — Patient Instructions (Signed)
It was good to see you today. we'll make referral for breast mammogram . Our office will contact you regarding appointment(s) once made. Use cool pack to tender area of breast - also NSAIDs like ibuprofen or aleve as needed

## 2011-11-29 NOTE — Assessment & Plan Note (Signed)
Reviewed recent eval with sleep specialist -  upcoming sleep study planned to confirm suspected dx

## 2011-12-04 ENCOUNTER — Other Ambulatory Visit: Payer: Self-pay | Admitting: Pulmonary Disease

## 2011-12-04 DIAGNOSIS — G4733 Obstructive sleep apnea (adult) (pediatric): Secondary | ICD-10-CM

## 2011-12-05 ENCOUNTER — Ambulatory Visit
Admission: RE | Admit: 2011-12-05 | Discharge: 2011-12-05 | Disposition: A | Discharge status: Home / Self Care | Payer: Self-pay | Source: Ambulatory Visit | Attending: Internal Medicine | Admitting: Internal Medicine

## 2011-12-05 ENCOUNTER — Other Ambulatory Visit: Payer: Self-pay | Admitting: Internal Medicine

## 2011-12-05 DIAGNOSIS — Z803 Family history of malignant neoplasm of breast: Secondary | ICD-10-CM

## 2011-12-05 DIAGNOSIS — N644 Mastodynia: Secondary | ICD-10-CM

## 2011-12-05 IMAGING — MG MM DIGITAL DIAGNOSTIC BILAT
4 series · 4 of 4 positions shown · non-contrast
Comparison: None.

CLINICAL DATA: 31-year-old patient describes that she had pain in
the upper left breast or her chest wall muscle after lifting her
child recently.  Since then, which she raises her left arm, she
notices a slight dimpling of the skin of the left breast in the
upper outer quadrant.  She palpates some firmness in the upper
outer left breast.  When her arm is relaxed down while she is lying
supine, the skin of the left breast appears normal.

DIGITAL DIAGNOSTIC BILATERAL MAMMOGRAM WITH CAD AND LEFT BREAST
ULTRASOUND:

[R CC]
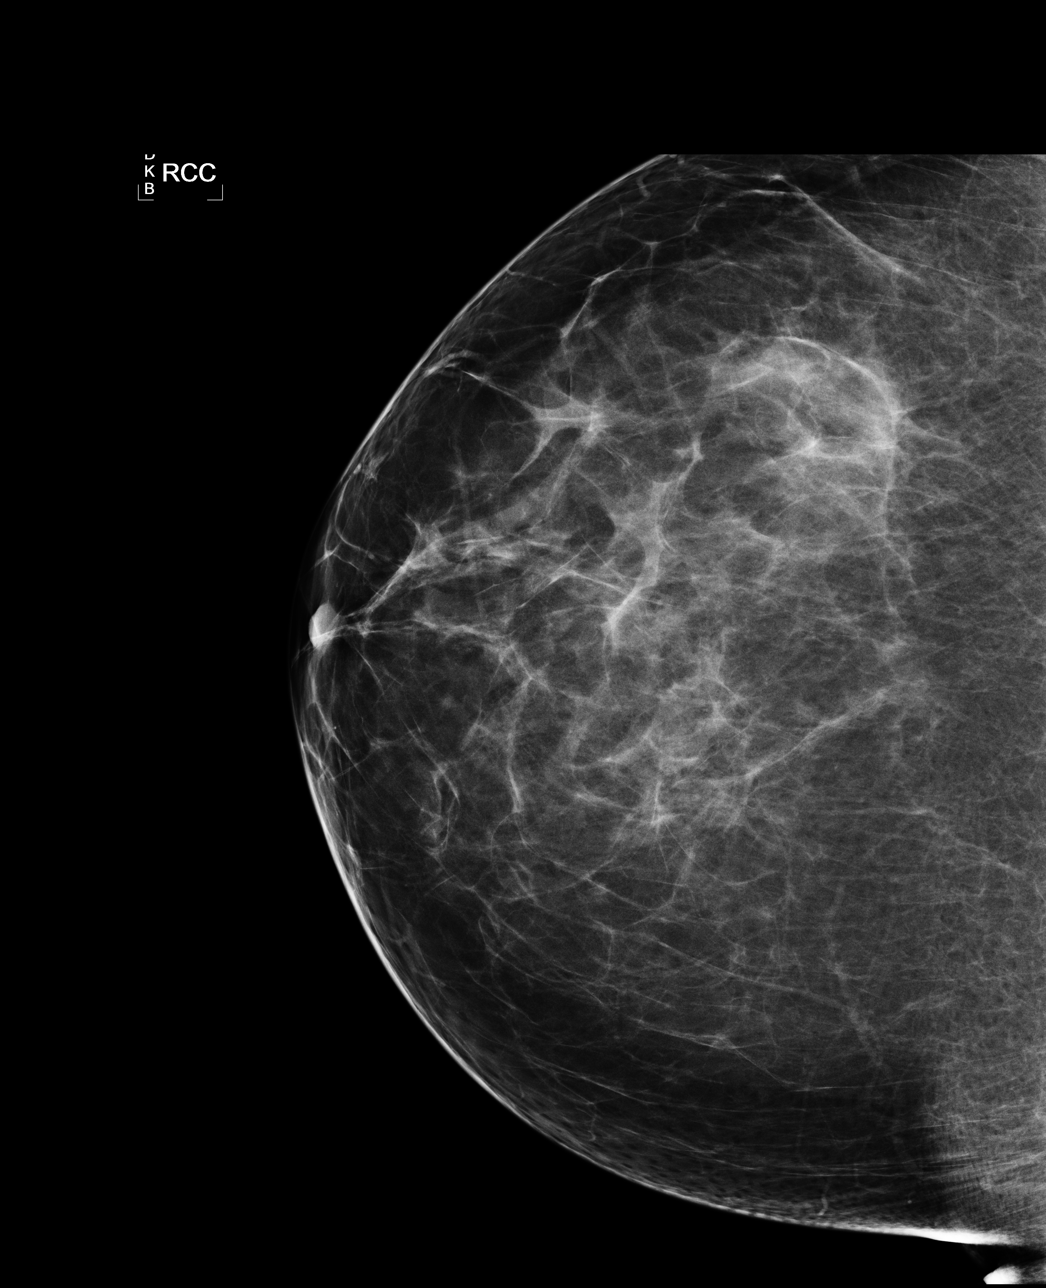

[L CC]
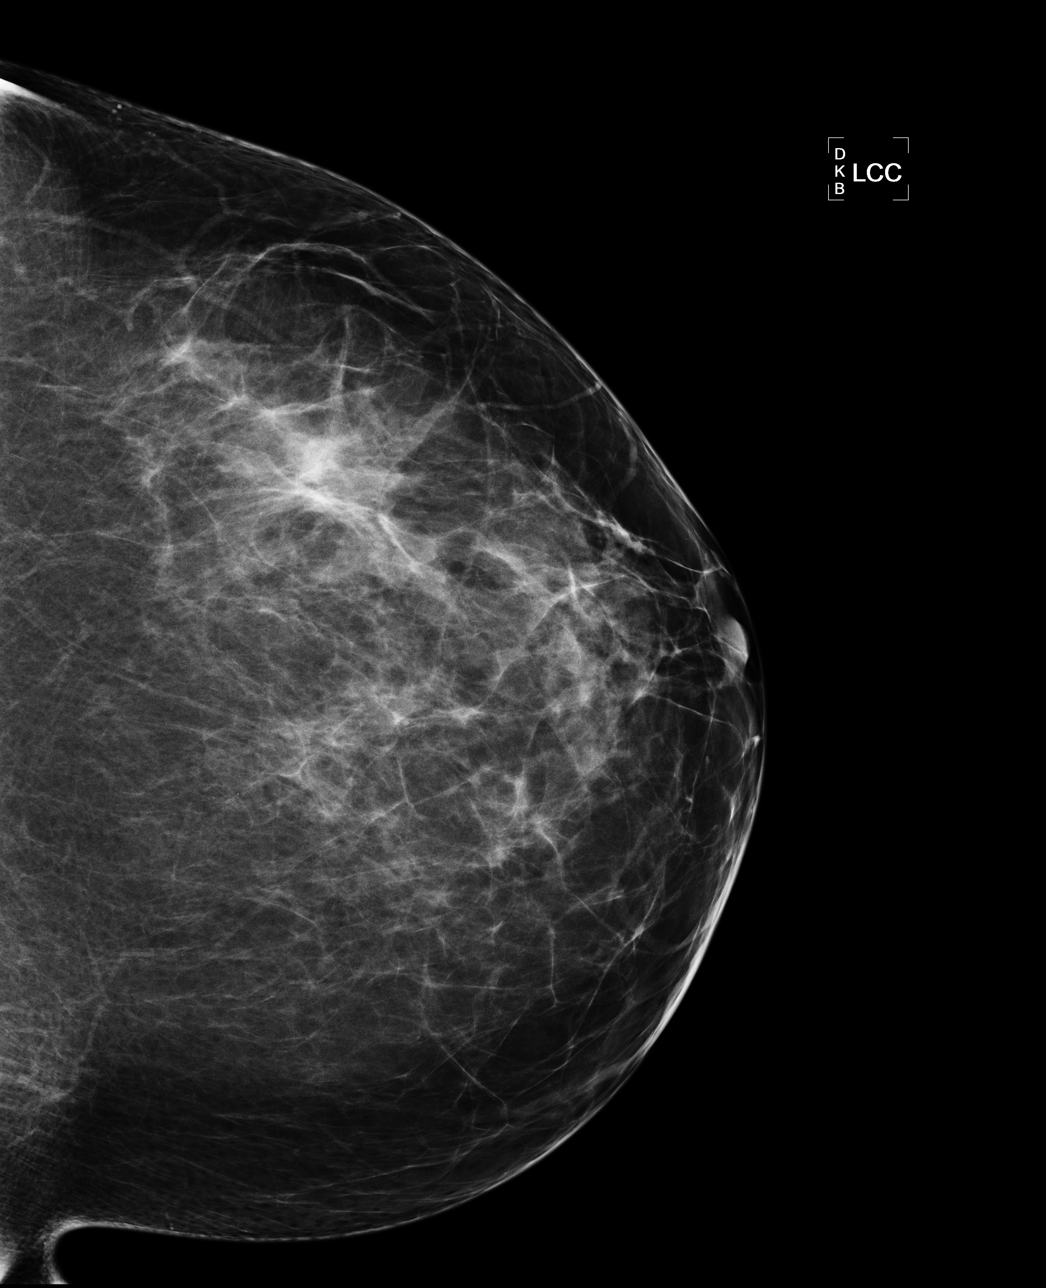

[L MLO]
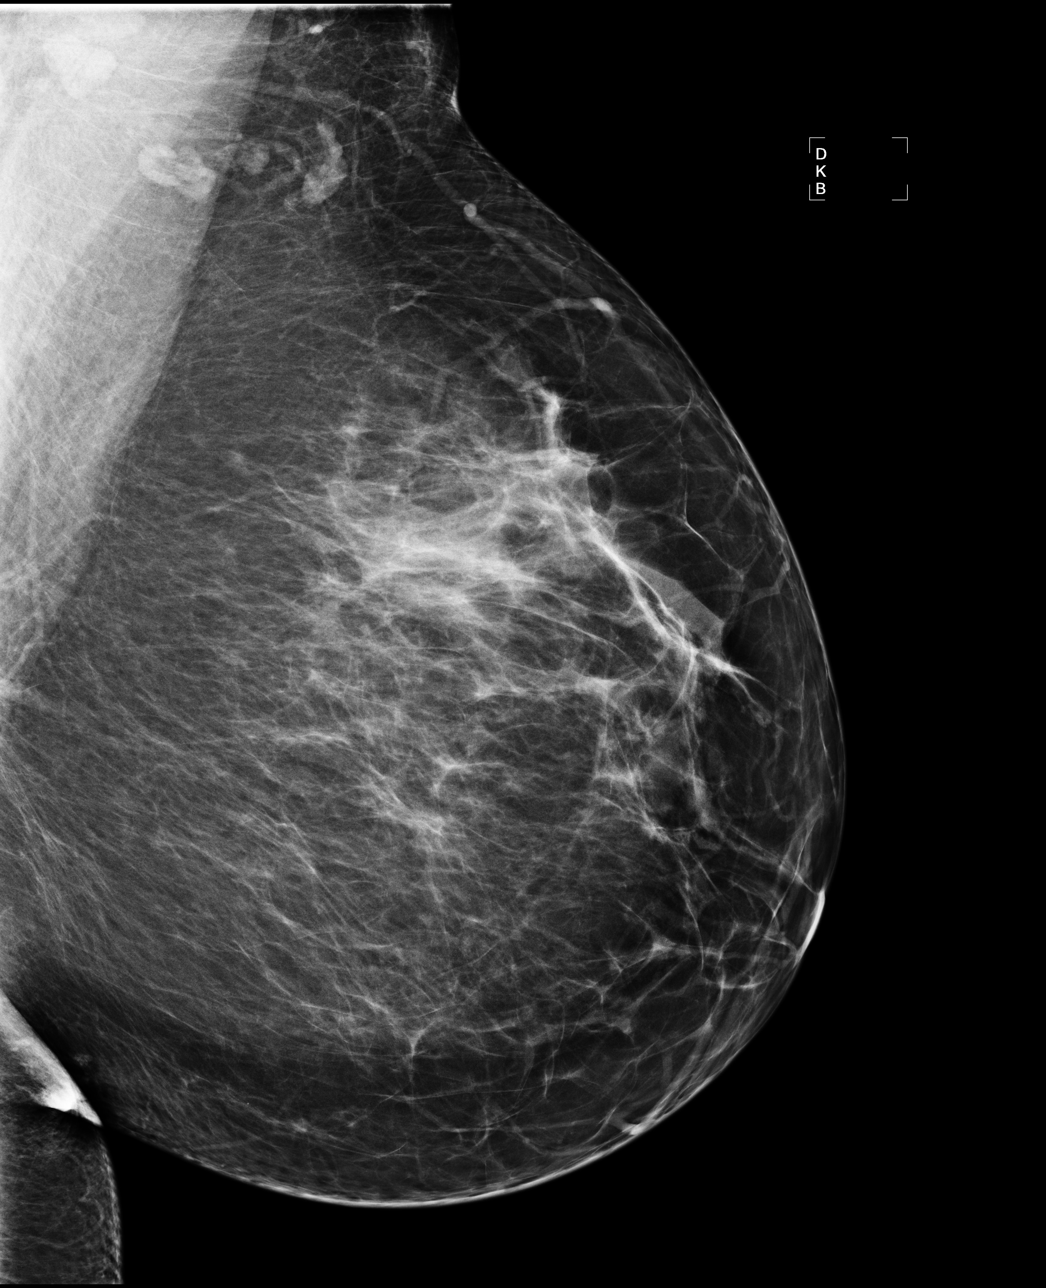

[R MLO]
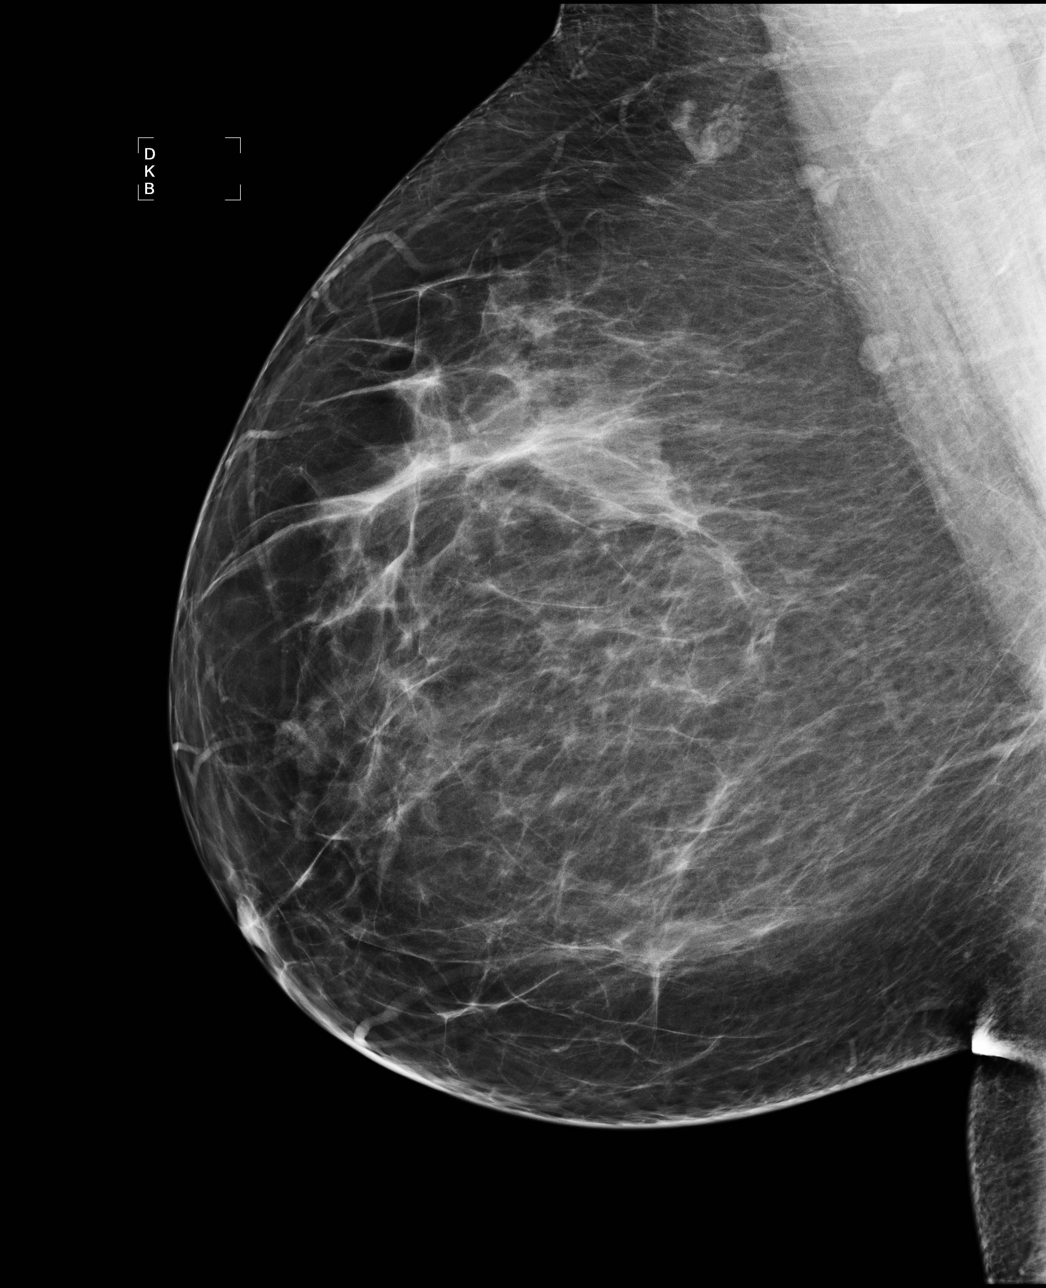

[4 of 4 positions shown; findings below may reference images not displayed]

FINDINGS: There are scattered fibroglandular densities
bilaterally.  No mass, distortion, or suspicious microcalcification
is identified to suggest malignancy.

Mammographic images were processed with CAD.

On physical exam, there are some stria in the upper outer quadrant
of the left breast, and a linear region of the skin is slightly
depressed when the patient raises her left arm. With the patient
supine, the skin of the left breast is smooth, with no persistent
depression. I do not palpate a mass in the left breast. There is no
bruising.

Ultrasound is performed, showing normal fibroglandular breast
tissue in the upper outer left breast.  No solid mass, cyst,
hematoma, or edema is identified.
IMPRESSION: No evidence of malignancy in either breast.  Question if the
patient could have a pectoralis muscle strain, giving pain after
lifting her child.

RECOMMENDATION:
Annual screening mammography beginning at age 40.

I have discussed the findings and recommendations with the patient.
Results were also provided in writing at the conclusion of the
visit.

BI-RADS CATEGORY 1:  Negative.

## 2012-01-11 ENCOUNTER — Encounter (HOSPITAL_BASED_OUTPATIENT_CLINIC_OR_DEPARTMENT_OTHER): Payer: Self-pay

## 2012-01-21 ENCOUNTER — Other Ambulatory Visit: Payer: Self-pay | Admitting: Obstetrics and Gynecology

## 2012-01-21 ENCOUNTER — Other Ambulatory Visit: Payer: Self-pay | Admitting: Internal Medicine

## 2012-01-21 NOTE — Telephone Encounter (Signed)
Ok to refill 

## 2012-01-25 ENCOUNTER — Ambulatory Visit: Payer: Self-pay | Admitting: Obstetrics and Gynecology

## 2012-02-13 ENCOUNTER — Encounter: Payer: Self-pay | Admitting: Obstetrics and Gynecology

## 2012-02-13 ENCOUNTER — Ambulatory Visit: Payer: Federal, State, Local not specified - PPO | Admitting: Obstetrics and Gynecology

## 2012-02-13 VITALS — BP 120/80 | HR 80 | Ht 62.0 in | Wt 252.0 lb

## 2012-02-13 DIAGNOSIS — Z124 Encounter for screening for malignant neoplasm of cervix: Secondary | ICD-10-CM

## 2012-02-13 MED ORDER — NORGESTIM-ETH ESTRAD TRIPHASIC 0.18/0.215/0.25 MG-35 MCG PO TABS: 1.0000 | ORAL_TABLET | Freq: Every day | ORAL | Status: DC

## 2012-02-13 NOTE — Progress Notes (Signed)
Last Pap: 01/23/11 WNL: Yes Regular Periods:yes Contraception: pill  Monthly Breast exam:no Tetanus<74yrs:yes Nl.Bladder Function:yes Daily BMs:yes Healthy Diet:yes Calcium:no Mammogram:yes Date of Mammogram: 11/2011 Exercise:no Have often Exercise: n/a Seatbelt: yes Abuse at home: no Stressful work:no Sigmoid-colonoscopy: n/a Bone Density: No PCP: Dr. Felicity Coyer Change in PMH: no changes Change in [facility]: mother passed away from squamous cell carcinoma unkown primary site BP 120/80  Pulse 80  Ht 5\' 2"  (1.575 m)  Wt 252 lb (114.306 kg)  BMI 46.09 kg/m2  LMP 01/25/2012 Pt with complaints:no Physical Examination: General appearance - alert, well appearing, and in no distress Mental status - normal mood, behavior, speech, dress, motor activity, and thought processes Neck - supple, no significant adenopathy,  thyroid exam: thyroid is normal in size without nodules or tenderness Chest - clear to auscultation, no wheezes, rales or rhonchi, symmetric air entry Heart - normal rate and regular rhythm Abdomen - soft, nontender, nondistended, no masses or organomegaly Breasts - breasts appear normal, no suspicious masses, no skin or nipple changes or axillary nodes Pelvic - normal external genitalia, vulva, vagina, cervix, uterus and adnexa. Nabothian cyst seen on cx Rectal - rectal exam not indicated Back exam - full range of motion, no tenderness, palpable spasm or pain on motion Neurological - alert, oriented, normal speech, no focal findings or movement disorder noted Musculoskeletal - no joint tenderness, deformity or swelling Extremities - no edema, redness or tenderness in the calves or thighs Skin - normal coloration and turgor, no rashes, no suspicious skin lesions noted Routine exam Pap sent yes if normal repeat in three years Mammogram due no OCP used for contraception RT 1 yr Pt coping well with the loss of her mother.  She is considering grief counseling

## 2012-02-14 LAB — PAP IG W/ RFLX HPV ASCU

## 2012-02-15 ENCOUNTER — Ambulatory Visit (HOSPITAL_BASED_OUTPATIENT_CLINIC_OR_DEPARTMENT_OTHER): Payer: Federal, State, Local not specified - PPO | Attending: Pulmonary Disease

## 2012-02-15 VITALS — Ht 68.0 in | Wt 255.0 lb

## 2012-02-15 DIAGNOSIS — G4730 Sleep apnea, unspecified: Secondary | ICD-10-CM | POA: Insufficient documentation

## 2012-02-15 DIAGNOSIS — G4710 Hypersomnia, unspecified: Secondary | ICD-10-CM | POA: Insufficient documentation

## 2012-02-15 DIAGNOSIS — G4733 Obstructive sleep apnea (adult) (pediatric): Secondary | ICD-10-CM

## 2012-02-20 ENCOUNTER — Encounter: Payer: Self-pay | Admitting: Obstetrics and Gynecology

## 2012-02-22 DIAGNOSIS — G4710 Hypersomnia, unspecified: Secondary | ICD-10-CM

## 2012-02-22 DIAGNOSIS — G4730 Sleep apnea, unspecified: Secondary | ICD-10-CM

## 2012-02-22 NOTE — Procedures (Signed)
[name], [name]             ACCOUNT NO.:  [id number]  MEDICAL RECORD NO.:  [id number]          PATIENT TYPE:  OUT  LOCATION:  SLEEP CENTER                 FACILITY:  [facility]  PHYSICIAN:  Barbaraann Share, MD,FCCPDATE OF BIRTH:  1979/07/07  DATE OF STUDY:  02/15/2012                           NOCTURNAL POLYSOMNOGRAM  REFERRING PHYSICIAN:  Barbaraann Share, MD,FCCP  INDICATION FOR STUDY:  Hypersomnia with sleep apnea.  EPWORTH SLEEPINESS SCORE:  11.  MEDICATIONS:  SLEEP ARCHITECTURE:  The patient had total sleep time of 344 minutes with decreased slow-wave sleep for age and only 63 minutes of REM. Sleep onset latency was normal at 11 minutes and REM onset was prolonged at 162 minutes.  Sleep efficiency was excellent at 95%.  RESPIRATORY DATA:  The patient was found to have no apneas and 7 obstructive hypopneas, giving her an apnea-hypopnea index of 1.2 events per hour.  The events occurred in all body positions and there was moderate snoring noted throughout.  The events were common during REM. The patient also had many episodes of airflow reduction that did not result in desaturation or arousal.  OXYGEN DATA:  There was transient O2 desaturation as low as 86% with the patient's obstructive events.  CARDIAC DATA:  No clinically significant arrhythmias were noted.  MOVEMENTS-PARASOMNIA:  The patient had no significant leg jerks or other abnormal behaviors seen.  IMPRESSION-RECOMMENDATION:  Small numbers of obstructive events which do not meet the AHI criteria for the obstructive sleep apnea syndrome.  The patient did have many episodes of airflow reduction that did not result in oxygen desaturations or arousal to be characterized as an obstructive hypopnea.  This may indicate a spectrum of sleep disordered breathing such as the upper airway resistant syndrome.  Treatment should focus on weight loss, positional therapy, and possibly dental appliance.    Barbaraann Share,  MD,FCCP Diplomate, American Board of Sleep Medicine   [facility]/MEDQ  D:  02/22/2012 09:52:30  T:  02/22/2012 21:43:56  Job:  [id number]

## 2012-02-25 ENCOUNTER — Ambulatory Visit: Payer: Federal, State, Local not specified - PPO | Admitting: Internal Medicine

## 2012-02-25 DIAGNOSIS — Z0289 Encounter for other administrative examinations: Secondary | ICD-10-CM

## 2012-03-03 ENCOUNTER — Telehealth: Payer: Self-pay | Admitting: Pulmonary Disease

## 2012-03-03 ENCOUNTER — Ambulatory Visit (INDEPENDENT_AMBULATORY_CARE_PROVIDER_SITE_OTHER): Payer: Federal, State, Local not specified - PPO | Admitting: Internal Medicine

## 2012-03-03 ENCOUNTER — Encounter: Payer: Self-pay | Admitting: Internal Medicine

## 2012-03-03 VITALS — BP 112/72 | HR 85 | Temp 98.7°F | Ht 62.0 in | Wt 253.4 lb

## 2012-03-03 DIAGNOSIS — F32A Depression, unspecified: Secondary | ICD-10-CM

## 2012-03-03 DIAGNOSIS — F3289 Other specified depressive episodes: Secondary | ICD-10-CM

## 2012-03-03 DIAGNOSIS — E669 Obesity, unspecified: Secondary | ICD-10-CM

## 2012-03-03 DIAGNOSIS — F329 Major depressive disorder, single episode, unspecified: Secondary | ICD-10-CM

## 2012-03-03 DIAGNOSIS — R0683 Snoring: Secondary | ICD-10-CM

## 2012-03-03 DIAGNOSIS — R0609 Other forms of dyspnea: Secondary | ICD-10-CM

## 2012-03-03 MED ORDER — TRIAMCINOLONE ACETONIDE 0.1 % EX LOTN: TOPICAL_LOTION | Freq: Three times a day (TID) | CUTANEOUS | Status: AC

## 2012-03-03 MED ORDER — PAROXETINE HCL 40 MG PO TABS: 40.0000 mg | ORAL_TABLET | ORAL | Status: DC

## 2012-03-03 NOTE — Progress Notes (Signed)
  Subjective:    Patient ID: [name], female    DOB: 06/22/1980, 32 y.o.   MRN: [id number]  HPI  Here for follow up -   Past Medical History  Diagnosis Date  . Gestational diabetes 2012  . Depression   . Cluster headaches   . GERD (gastroesophageal reflux disease)   . Hyperlipidemia   . Hypertension   . Snoring     sleep study 02/2012 with min AHI events    Review of Systems  Constitutional: Negative for fever or unexpected weight change.  Respiratory: Negative for cough and shortness of breath.   Cardiovascular: Negative for chest pain or palpitations.      Objective:   Physical Exam  BP 112/72  Pulse 85  Temp(Src) 98.7 F (37.1 C) (Oral)  Ht 5\' 2"  (1.575 m)  Wt 253 lb 6.4 oz (114.941 kg)  BMI 46.34 kg/m2  SpO2 95%  LMP 01/25/2012 Wt Readings from Last 3 Encounters:  03/03/12 253 lb 6.4 oz (114.941 kg)  02/15/12 255 lb (115.667 kg)  02/13/12 252 lb (114.306 kg)   Constitutional: She is overweight, but appears well-developed and well-nourished. No distress. Neck: Short and thick. Normal range of motion. Neck supple. No JVD or LAD present. No thyromegaly present.  Cardiovascular: Normal rate, regular rhythm and normal heart sounds.  No murmur heard. No BLE edema. Pulmonary/Chest: Effort normal and breath sounds normal. No respiratory distress. She has no wheezes.  Psychiatric: She has a mildly anxious mood and affect. Her behavior is normal. Judgment and thought content normal.   Lab Results  Component Value Date   WBC 6.8 05/17/2011   HGB 13.1 05/17/2011   HCT 39.0 05/17/2011   PLT 230.0 05/17/2011   GLUCOSE 87 05/17/2011   CHOL 188 05/17/2011   TRIG 128.0 05/17/2011   HDL 48.80 05/17/2011   LDLCALC 114* 05/17/2011   ALT 16 05/17/2011   AST 16 05/17/2011   NA 141 05/17/2011   K 4.2 05/17/2011   CL 107 05/17/2011   CREATININE 0.8 05/17/2011   BUN 12 05/17/2011   CO2 25 05/17/2011   TSH 1.54 05/17/2011   HGBA1C 5.7 02/16/2011       Assessment & Plan:   See problem list.  Medications and labs reviewed today.

## 2012-03-03 NOTE — Telephone Encounter (Signed)
Please let pt know that she needs ov to review sleep study.  Would like to get her in ?this week.  Squeeze her in somewhere if possible.

## 2012-03-03 NOTE — Assessment & Plan Note (Signed)
Classic symptoms overlap with anxiety and possible PTSD Continue working with behavioral counselor for same Started generic Paxil 02/2011, dose increased 05/2011, increase to max dose now (02/2012) Also recommended followup with hospice for counseling, mother passed away 01/18/12. Support offered today Verified no si/hi today

## 2012-03-03 NOTE — Assessment & Plan Note (Signed)
Reviewed recent eval with sleep specialist -  02/22/12 sleep study Did not confirm obstructive sleep apnea as an frequent AHI Recommended to followup on suggestion of nocturnal mouth guard to minimize grinding Followup with sleep specialist as needed if continued problems ([place])

## 2012-03-03 NOTE — Telephone Encounter (Signed)
Unable to reach---phone giving d/c tone. Both numbers are the same-no additional numbers to call.  Patient needs appt per [facility] note below.  Review sleep study asap---this week (see [facility] before scheduling in any HOLD slots)

## 2012-03-03 NOTE — Patient Instructions (Signed)
It was good to see you today. We have reviewed your prior records including labs and tests today Increase "paxil" to 40mg  daily - Your prescription(s) have been submitted to your pharmacy. Please take as directed and contact our office if you believe you are having problem(s) with the medication(s). Other Medications reviewed and updated, no changes at this time. Work on lifestyle changes as discussed (low fat, low carb, increased protein diet; improved exercise efforts; weight loss) to control sugar, blood pressure and cholesterol levels and/or reduce risk of developing other medical problems. Look into [email or web address] or other type of food journal to assist you in this process. Look into grief and bereavement counseling - let us know if you need other support Please schedule followup in 6 months foe medical physical, labs and weight check, call sooner if problems.

## 2012-03-03 NOTE — Assessment & Plan Note (Signed)
Wt Readings from Last 3 Encounters:  03/03/12 253 lb 6.4 oz (114.941 kg)  02/15/12 255 lb (115.667 kg)  02/13/12 252 lb (114.306 kg)   Education today on importance of diet and exercise  The patient is asked to make an attempt to improve diet and exercise patterns to aid in medical management of this problem.

## 2012-04-01 NOTE — Telephone Encounter (Signed)
LMOM x 1 

## 2012-04-11 NOTE — Telephone Encounter (Signed)
Letter mailed to pt to contact our office. Message to be closed.

## 2012-04-11 NOTE — Telephone Encounter (Signed)
LMOM x 2 

## 2012-04-24 ENCOUNTER — Ambulatory Visit (INDEPENDENT_AMBULATORY_CARE_PROVIDER_SITE_OTHER): Payer: Federal, State, Local not specified - PPO | Admitting: Pulmonary Disease

## 2012-04-24 ENCOUNTER — Encounter: Payer: Self-pay | Admitting: Pulmonary Disease

## 2012-04-24 VITALS — BP 124/72 | HR 72 | Temp 98.1°F | Ht 63.0 in | Wt 249.6 lb

## 2012-04-24 DIAGNOSIS — G478 Other sleep disorders: Secondary | ICD-10-CM

## 2012-04-24 NOTE — Patient Instructions (Addendum)
Work on weight loss and staying off you back during sleep Let me know if you are not successful, or if your symptoms are greatly impacting your quality of life.  At that point, can consider more aggressive therapies.

## 2012-04-24 NOTE — Progress Notes (Signed)
  Subjective:    Patient ID: [name], female    DOB: 1980-12-02, 32 y.o.   MRN: [id number]  HPI The patient comes in today for followup of her sleep study.  She was found to have no clinically significant obstructive sleep apnea, but did have frequent episodes of airflow reduction that did not result in desaturation that would meet requirement for a hypopnea.  I have reviewed the study and detail with her, and answered all of her questions.   Review of Systems  Constitutional: Negative for fever and unexpected weight change.  HENT: Negative for ear pain, nosebleeds, congestion, sore throat, rhinorrhea, sneezing, trouble swallowing, dental problem, postnasal drip and sinus pressure.   Eyes: Negative for redness and itching.  Respiratory: Negative for cough, chest tightness, shortness of breath and wheezing.   Cardiovascular: Negative for palpitations and leg swelling.  Gastrointestinal: Negative for nausea and vomiting.  Genitourinary: Negative for dysuria.  Musculoskeletal: Negative for joint swelling.  Skin: Negative for rash.  Neurological: Negative for headaches.  Hematological: Does not bruise/bleed easily.  Psychiatric/Behavioral: Positive for dysphoric mood. The patient is not nervous/anxious.        Objective:   Physical Exam Obese female in no acute distress Nose without purulence or discharge noted Neck without lymphadenopathy or thyromegaly Lower extremities without significant edema, no cyanosis Alert and oriented, moves all 4 extremities.       Assessment & Plan:

## 2012-04-24 NOTE — Assessment & Plan Note (Signed)
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  I have explained to her this is a pre-sleep apnea condition that can fragment and disrupts sleep.  The main treatment consist of weight loss as well as positional therapy, but can consider more aggressive therapy such as CPAP or dental appliance if she remains very symptomatic.  Part of the problem is that insurance does not recognize the upper air resistance syndrome, although we can sometimes get treatment covered through petitioning the review board.  The patient would like to take the next 6 months and work aggressively on weight loss and positional therapy and see how she does.

## 2012-06-16 ENCOUNTER — Telehealth: Payer: Self-pay | Admitting: *Deleted

## 2012-06-16 NOTE — Telephone Encounter (Signed)
Left msg on triage wanting to see if md receive fax from [employer] on Friday. Called pt back inform her we haven't receive fax. Gave her back # [phone] to have them to resend...lmb

## 2012-06-19 ENCOUNTER — Telehealth: Payer: Self-pay | Admitting: *Deleted

## 2012-06-19 NOTE — Telephone Encounter (Signed)
No, per pt controlled with diet.

## 2012-06-19 NOTE — Telephone Encounter (Signed)
Message copied by Carin Primrose on Thu Jun 19, 2012  3:26 PM ------      Message from: Lorre Munroe      Created: Thu Jun 19, 2012  1:55 PM      Regarding: call pt in order to complete form       Ash,       can you please call pt to see if she has ever been treated with insulin when she had gestational diabetes, we need this info in order to complete her form for plasma donation. ------

## 2012-06-30 DIAGNOSIS — Z0279 Encounter for issue of other medical certificate: Secondary | ICD-10-CM

## 2012-09-01 ENCOUNTER — Encounter: Payer: Self-pay | Admitting: Internal Medicine

## 2012-09-01 ENCOUNTER — Ambulatory Visit (INDEPENDENT_AMBULATORY_CARE_PROVIDER_SITE_OTHER): Payer: Federal, State, Local not specified - PPO | Admitting: Internal Medicine

## 2012-09-01 VITALS — BP 118/80 | HR 65 | Temp 98.3°F | Wt 256.0 lb

## 2012-09-01 DIAGNOSIS — Z0000 Encounter for general adult medical examination without abnormal findings: Secondary | ICD-10-CM

## 2012-09-01 DIAGNOSIS — F32A Depression, unspecified: Secondary | ICD-10-CM

## 2012-09-01 DIAGNOSIS — F329 Major depressive disorder, single episode, unspecified: Secondary | ICD-10-CM

## 2012-09-01 DIAGNOSIS — E669 Obesity, unspecified: Secondary | ICD-10-CM

## 2012-09-01 DIAGNOSIS — F3289 Other specified depressive episodes: Secondary | ICD-10-CM

## 2012-09-01 NOTE — Assessment & Plan Note (Signed)
Classic symptoms overlap with anxiety and possible PTSD s/p behavioral counselor for same Started generic Paxil 02/2011, dose increased 05/2011, increase to max dose 02/2012 symptoms improved, continue same Reviewed stressors: work, mother passed away 2013-12-23Verified no si/hi today

## 2012-09-01 NOTE — Progress Notes (Signed)
Subjective:    Patient ID: [name], female    DOB: [birth year], 32 y.o.   MRN: [id number]  HPI  patient is here today for annual physical. Patient feels well overall  Also reviewed chronic medical issues and interval medical events.  Past Medical History  Diagnosis Date  . Gestational diabetes 2012  . Depression   . Cluster headaches   . GERD (gastroesophageal reflux disease)   . Hyperlipidemia   . Hypertension   . Snoring     sleep study 02/2012 with min AHI events   Family History  Problem Relation Age of Onset  . Arthritis Mother   . Alcohol abuse Mother   . Mental illness Mother   . Cancer Mother 32    squamous - unknown primary  . Coronary artery disease Mother 18    MI with stent  . Arthritis Father   . Alcohol abuse Father   . Hyperlipidemia Father   . Heart disease Father   . Kidney disease Father   . Stroke Father   . Mental illness Father   . Diabetes Maternal Grandmother   . Heart disease Maternal Grandmother   . Stroke Maternal Grandfather   . Hypertension Paternal Grandfather   . Hyperlipidemia Paternal Grandfather   . Arthritis Other   . Alcohol abuse Other   . Breast cancer Paternal Aunt    History  Substance Use Topics  . Smoking status: Current Every Day Smoker    Types: Cigarettes  . Smokeless tobacco: Never Used     Comment: pt states she smokes 3-5 cigarettes per day   . Alcohol Use: Yes     Comment: social    Review of Systems Constitutional: Negative for fever or unexpected weight change.  Respiratory: Negative for cough and shortness of breath.   Cardiovascular: Negative for chest pain or palpitations.  Gastrointestinal: Negative for abdominal pain, no bowel changes.  Musculoskeletal: Negative for gait problem or joint swelling.  Skin: Negative for rash.  Neurological: Negative for dizziness or headache.  No other specific complaints in a complete review of systems (except as listed in HPI above).       Objective:    Physical Exam BP 118/80  Pulse 65  Temp(Src) 98.3 F (36.8 C) (Oral)  Wt 256 lb (116.121 kg)  BMI 45.36 kg/m2  SpO2 97% Wt Readings from Last 3 Encounters:  09/01/12 256 lb (116.121 kg)  04/24/12 249 lb 9.6 oz (113.218 kg)  03/03/12 253 lb 6.4 oz (114.941 kg)   Constitutional: She is overweight, but appears well-developed and well-nourished. No distress. Spouse at side HENT: Head: Normocephalic and atraumatic. Ears: B TMs ok, no erythema or effusion; Nose: Nose normal. Mouth/Throat: Oropharynx is clear and moist. No oropharyngeal exudate.  Eyes: Conjunctivae and EOM are normal. Pupils are equal, round, and reactive to light. No scleral icterus.  Neck: Thick, short neck. Normal range of motion. Neck supple. No JVD present. No thyromegaly present.  Cardiovascular: Normal rate, regular rhythm and normal heart sounds.  No murmur heard. No BLE edema. Pulmonary/Chest: Effort normal and breath sounds normal. No respiratory distress. She has no wheezes.  Abdominal: Soft. Bowel sounds are normal. She exhibits no distension. There is no tenderness. no masses Musculoskeletal: Normal range of motion, no joint effusions. No gross deformities Neurological: She is alert and oriented to person, place, and time. No cranial nerve deficit. Coordination, balance, strength, speech and gait are normal.  Skin: Skin is warm and dry. No rash noted. No erythema.  Psychiatric: She has a mildly anxious mood and affect. Her behavior is normal. Judgment and thought content normal.   Lab Results  Component Value Date   WBC 6.8 05/17/2011   HGB 13.1 05/17/2011   HCT 39.0 05/17/2011   PLT 230.0 05/17/2011   GLUCOSE 87 05/17/2011   CHOL 188 05/17/2011   TRIG 128.0 05/17/2011   HDL 48.80 05/17/2011   LDLCALC 114* 05/17/2011   ALT 16 05/17/2011   AST 16 05/17/2011   NA 141 05/17/2011   K 4.2 05/17/2011   CL 107 05/17/2011   CREATININE 0.8 05/17/2011   BUN 12 05/17/2011   CO2 25 05/17/2011   TSH 1.54 05/17/2011   HGBA1C 5.7 02/16/2011        Assessment & Plan:   CPX/v70.0 - Patient has been counseled on age-appropriate routine health concerns for screening and prevention. These are reviewed and up-to-date. Immunizations are up-to-date or declined. Labs ordered and reviewed.  See problem list. Medications and labs reviewed today.

## 2012-09-01 NOTE — Assessment & Plan Note (Signed)
Wt Readings from Last 3 Encounters:  09/01/12 256 lb (116.121 kg)  04/24/12 249 lb 9.6 oz (113.218 kg)  03/03/12 253 lb 6.4 oz (114.941 kg)   Education today on importance of diet and exercise  The patient is asked to make an attempt to improve diet and exercise patterns to aid in medical management of this problem.

## 2012-09-01 NOTE — Patient Instructions (Addendum)
It was good to see you today. We have reviewed your prior records including labs and tests today Health Maintenance reviewed - all recommended immunizations and age-appropriate screenings are up-to-date. Test(s) ordered today. Return when you are fasting for labs only. Your results will be released to MyChart (or called to you) after review, usually within 72hours after test completion. If any changes need to be made, you will be notified at that same time. Medications reviewed and updated, no changes recommended at this time. Please schedule followup in 12 months for annual exam, call sooner if problems.  Health Maintenance, Females A healthy lifestyle and preventative care can promote health and wellness.  Maintain regular health, dental, and eye exams.  Eat a healthy diet. Foods like vegetables, fruits, whole grains, low-fat dairy products, and lean protein foods contain the nutrients you need without too many calories. Decrease your intake of foods high in solid fats, added sugars, and salt. Get information about a proper diet from your caregiver, if necessary.  Regular physical exercise is one of the most important things you can do for your health. Most adults should get at least 150 minutes of moderate-intensity exercise (any activity that increases your heart rate and causes you to sweat) each week. In addition, most adults need muscle-strengthening exercises on 2 or more days a week.   Maintain a healthy weight. The body mass index (BMI) is a screening tool to identify possible weight problems. It provides an estimate of body fat based on height and weight. Your caregiver can help determine your BMI, and can help you achieve or maintain a healthy weight. For adults 20 years and older:  A BMI below 18.5 is considered underweight.  A BMI of 18.5 to 24.9 is normal.  A BMI of 25 to 29.9 is considered overweight.  A BMI of 30 and above is considered obese.  Maintain normal blood lipids  and cholesterol by exercising and minimizing your intake of saturated fat. Eat a balanced diet with plenty of fruits and vegetables. Blood tests for lipids and cholesterol should begin at age 34 and be repeated every 5 years. If your lipid or cholesterol levels are high, you are over 50, or you are a high risk for heart disease, you may need your cholesterol levels checked more frequently.Ongoing high lipid and cholesterol levels should be treated with medicines if diet and exercise are not effective.  If you smoke, find out from your caregiver how to quit. If you do not use tobacco, do not start.  If you are pregnant, do not drink alcohol. If you are breastfeeding, be very cautious about drinking alcohol. If you are not pregnant and choose to drink alcohol, do not exceed 1 drink per day. One drink is considered to be 12 ounces (355 mL) of beer, 5 ounces (148 mL) of wine, or 1.5 ounces (44 mL) of liquor.  Avoid use of street drugs. Do not share needles with anyone. Ask for help if you need support or instructions about stopping the use of drugs.  High blood pressure causes heart disease and increases the risk of stroke. Blood pressure should be checked at least every 1 to 2 years. Ongoing high blood pressure should be treated with medicines, if weight loss and exercise are not effective.  If you are 60 to 33 years old, ask your caregiver if you should take aspirin to prevent strokes.  Diabetes screening involves taking a blood sample to check your fasting blood sugar level. This should be  done once every 3 years, after age 59, if you are within normal weight and without risk factors for diabetes. Testing should be considered at a younger age or be carried out more frequently if you are overweight and have at least 1 risk factor for diabetes.  Breast cancer screening is essential preventative care for women. You should practice "breast self-awareness." This means understanding the normal appearance and  feel of your breasts and may include breast self-examination. Any changes detected, no matter how small, should be reported to a caregiver. Women in their 51s and 30s should have a clinical breast exam (CBE) by a caregiver as part of a regular health exam every 1 to 3 years. After age 33, women should have a CBE every year. Starting at age 25, women should consider having a mammogram (breast X-ray) every year. Women who have a family history of breast cancer should talk to their caregiver about genetic screening. Women at a high risk of breast cancer should talk to their caregiver about having an MRI and a mammogram every year.  The Pap test is a screening test for cervical cancer. Women should have a Pap test starting at age 21. Between ages 59 and 74, Pap tests should be repeated every 2 years. Beginning at age 25, you should have a Pap test every 3 years as long as the past 3 Pap tests have been normal. If you had a hysterectomy for a problem that was not cancer or a condition that could lead to cancer, then you no longer need Pap tests. If you are between ages 51 and 43, and you have had normal Pap tests going back 10 years, you no longer need Pap tests. If you have had past treatment for cervical cancer or a condition that could lead to cancer, you need Pap tests and screening for cancer for at least 20 years after your treatment. If Pap tests have been discontinued, risk factors (such as a new sexual partner) need to be reassessed to determine if screening should be resumed. Some women have medical problems that increase the chance of getting cervical cancer. In these cases, your caregiver may recommend more frequent screening and Pap tests.  The human papillomavirus (HPV) test is an additional test that may be used for cervical cancer screening. The HPV test looks for the virus that can cause the cell changes on the cervix. The cells collected during the Pap test can be tested for HPV. The HPV test could  be used to screen women aged 50 years and older, and should be used in women of any age who have unclear Pap test results. After the age of 88, women should have HPV testing at the same frequency as a Pap test.  Colorectal cancer can be detected and often prevented. Most routine colorectal cancer screening begins at the age of 75 and continues through age 3. However, your caregiver may recommend screening at an earlier age if you have risk factors for colon cancer. On a yearly basis, your caregiver may provide home test kits to check for hidden blood in the stool. Use of a small camera at the end of a tube, to directly examine the colon (sigmoidoscopy or colonoscopy), can detect the earliest forms of colorectal cancer. Talk to your caregiver about this at age 39, when routine screening begins. Direct examination of the colon should be repeated every 5 to 10 years through age 4, unless early forms of pre-cancerous polyps or small growths are found.  Hepatitis C blood testing is recommended for all people born from [birth year] through 1966 and any individual with known risks for hepatitis C.  Practice safe sex. Use condoms and avoid high-risk sexual practices to reduce the spread of sexually transmitted infections (STIs). Sexually active women aged 37 and younger should be checked for Chlamydia, which is a common sexually transmitted infection. Older women with new or multiple partners should also be tested for Chlamydia. Testing for other STIs is recommended if you are sexually active and at increased risk.  Osteoporosis is a disease in which the bones lose minerals and strength with aging. This can result in serious bone fractures. The risk of osteoporosis can be identified using a bone density scan. Women ages 17 and over and women at risk for fractures or osteoporosis should discuss screening with their caregivers. Ask your caregiver whether you should be taking a calcium supplement or vitamin D to reduce the  rate of osteoporosis.  Menopause can be associated with physical symptoms and risks. Hormone replacement therapy is available to decrease symptoms and risks. You should talk to your caregiver about whether hormone replacement therapy is right for you.  Use sunscreen with a sun protection factor (SPF) of 30 or greater. Apply sunscreen liberally and repeatedly throughout the day. You should seek shade when your shadow is shorter than you. Protect yourself by wearing long sleeves, pants, a wide-brimmed hat, and sunglasses year round, whenever you are outdoors.  Notify your caregiver of new moles or changes in moles, especially if there is a change in shape or color. Also notify your caregiver if a mole is larger than the size of a pencil eraser.  Stay current with your immunizations. Document Released: 07/10/2010 Document Revised: 03/19/2011 Document Reviewed: 07/10/2010 [facility] Patient Information 2014 [place], [place].

## 2012-09-09 ENCOUNTER — Encounter: Payer: Self-pay | Admitting: Internal Medicine

## 2012-09-09 ENCOUNTER — Other Ambulatory Visit (INDEPENDENT_AMBULATORY_CARE_PROVIDER_SITE_OTHER): Payer: Federal, State, Local not specified - PPO

## 2012-09-09 DIAGNOSIS — Z0000 Encounter for general adult medical examination without abnormal findings: Secondary | ICD-10-CM

## 2012-09-09 LAB — CBC WITH DIFFERENTIAL/PLATELET
Basophils Relative: 0.6 % (ref 0.0–3.0)
Eosinophils Relative: 2.6 % (ref 0.0–5.0)
HCT: 39.7 % (ref 36.0–46.0)
Hemoglobin: 13.4 g/dL (ref 12.0–15.0)
Lymphs Abs: 2.2 10*3/uL (ref 0.7–4.0)
MCV: 87.6 fl (ref 78.0–100.0)
Monocytes Absolute: 0.5 10*3/uL (ref 0.1–1.0)
Monocytes Relative: 5.9 % (ref 3.0–12.0)
Neutro Abs: 4.8 10*3/uL (ref 1.4–7.7)
Platelets: 257 10*3/uL (ref 150.0–400.0)
RBC: 4.53 Mil/uL (ref 3.87–5.11)
WBC: 7.8 10*3/uL (ref 4.5–10.5)

## 2012-09-09 LAB — BASIC METABOLIC PANEL
BUN: 8 mg/dL (ref 6–23)
Chloride: 105 mEq/L (ref 96–112)
Potassium: 3.8 mEq/L (ref 3.5–5.1)
Sodium: 137 mEq/L (ref 135–145)

## 2012-09-09 LAB — LIPID PANEL
HDL: 35.6 mg/dL — ABNORMAL LOW (ref >39.00)
Triglycerides: 222 mg/dL — ABNORMAL HIGH (ref 0.0–149.0)
VLDL: 44.4 mg/dL — ABNORMAL HIGH (ref 0.0–40.0)

## 2012-09-09 LAB — HEPATIC FUNCTION PANEL
Bilirubin, Direct: 0 mg/dL (ref 0.0–0.3)
Total Protein: 6.8 g/dL (ref 6.0–8.3)

## 2012-10-08 ENCOUNTER — Other Ambulatory Visit: Payer: Self-pay | Admitting: Internal Medicine

## 2012-10-21 ENCOUNTER — Encounter: Payer: Self-pay | Admitting: Pulmonary Disease

## 2012-10-21 ENCOUNTER — Telehealth: Payer: Self-pay | Admitting: Pulmonary Disease

## 2012-10-21 ENCOUNTER — Ambulatory Visit (INDEPENDENT_AMBULATORY_CARE_PROVIDER_SITE_OTHER): Payer: Federal, State, Local not specified - PPO | Admitting: Pulmonary Disease

## 2012-10-21 VITALS — BP 122/88 | HR 70 | Temp 98.4°F | Ht 63.0 in | Wt 263.6 lb

## 2012-10-21 DIAGNOSIS — G478 Other sleep disorders: Secondary | ICD-10-CM

## 2012-10-21 DIAGNOSIS — Z23 Encounter for immunization: Secondary | ICD-10-CM

## 2012-10-21 NOTE — Patient Instructions (Signed)
Continue work on weight loss, and staying off your back while sleeping. Will refer you to dental medicine for evaluation of a dental appliance.  Please speak with your dentist and see who they recommend.  We use Dr. Althea Grimmer a lot, and have good feedback from our patients.

## 2012-10-21 NOTE — Telephone Encounter (Signed)
Per OV note: Will refer you to dental medicine for evaluation of a dental appliance. Please speak with your dentist and see who they recommend. We use Dr. Althea Grimmer a lot, and have good feedback from our patients.    Pt called back and asked to use [facility] for this. Order placed. Carron Curie, CMA

## 2012-10-21 NOTE — Assessment & Plan Note (Signed)
The patient has known upper airway resistant syndrome with significant impact on sleep and daytime alertness.  [name] has been unsuccessful with weight loss, and has actually gained significant weight since the last visit.  I have reviewed with her possibly trying CPAP or a dental appliance.  Both will be difficult to get covered by insurance because of her diagnosis, but I am willing to work with her on either.  [name] is interested in pursuing a dental appliance, and I have asked her to discuss this with her dentist.  When [name] decides who [name] would like to see, I will be happy to refer her.  I also encouraged her to continue working on aggressive weight loss.

## 2012-10-21 NOTE — Progress Notes (Signed)
  Subjective:    Patient ID: [name], female    DOB: [birth year], 32 y.o.   MRN: [id number]  HPI Patient comes in today for followup of her upper airway resistant syndrome.  She has tried working on weight loss but has not been successful, and continues to have loud snoring, disrupted sleep, and inappropriate daytime sleepiness.  She wishes to discuss further options, and has done some research on a dental appliance.   Review of Systems  Constitutional: Negative for fever and unexpected weight change.  HENT: Negative for congestion, dental problem, ear pain, nosebleeds, postnasal drip, rhinorrhea, sinus pressure, sneezing, sore throat and trouble swallowing.   Eyes: Negative for redness and itching.  Respiratory: Positive for shortness of breath. Negative for cough, chest tightness and wheezing.   Cardiovascular: Negative for palpitations and leg swelling.  Gastrointestinal: Negative for nausea and vomiting.  Genitourinary: Negative for dysuria.  Musculoskeletal: Negative for joint swelling.  Skin: Negative for rash.  Neurological: Negative for headaches.  Hematological: Does not bruise/bleed easily.  Psychiatric/Behavioral: Negative for dysphoric mood. The patient is not nervous/anxious.        Objective:   Physical Exam Obese female in no acute distress Nose without purulent discharge noted Neck without lymphadenopathy or thyromegaly Lower extremities with mild edema, no cyanosis Awake, but does appear mildly sleepy, moves all 4 extremities.       Assessment & Plan:

## 2012-11-04 ENCOUNTER — Encounter: Payer: Self-pay | Admitting: Internal Medicine

## 2012-11-05 ENCOUNTER — Ambulatory Visit (INDEPENDENT_AMBULATORY_CARE_PROVIDER_SITE_OTHER): Payer: Federal, State, Local not specified - PPO | Admitting: Internal Medicine

## 2012-11-05 ENCOUNTER — Ambulatory Visit (INDEPENDENT_AMBULATORY_CARE_PROVIDER_SITE_OTHER)
Admission: RE | Admit: 2012-11-05 | Discharge: 2012-11-05 | Disposition: A | Discharge status: Home / Self Care | Payer: Federal, State, Local not specified - PPO | Source: Ambulatory Visit | Attending: Internal Medicine | Admitting: Internal Medicine

## 2012-11-05 ENCOUNTER — Encounter: Payer: Self-pay | Admitting: Internal Medicine

## 2012-11-05 VITALS — BP 122/78 | HR 69 | Temp 98.5°F | Wt 260.8 lb

## 2012-11-05 DIAGNOSIS — IMO0002 Reserved for concepts with insufficient information to code with codable children: Secondary | ICD-10-CM

## 2012-11-05 DIAGNOSIS — S63611A Unspecified sprain of left index finger, initial encounter: Secondary | ICD-10-CM

## 2012-11-05 IMAGING — CR DG FINGER INDEX 2+V*L*
1 series · 1 of 1 positions shown · non-contrast
Comparison: None.

CLINICAL DATA: Left index finger pain, no known injury

EXAM:
LEFT INDEX FINGER 2+V

[view not recorded]
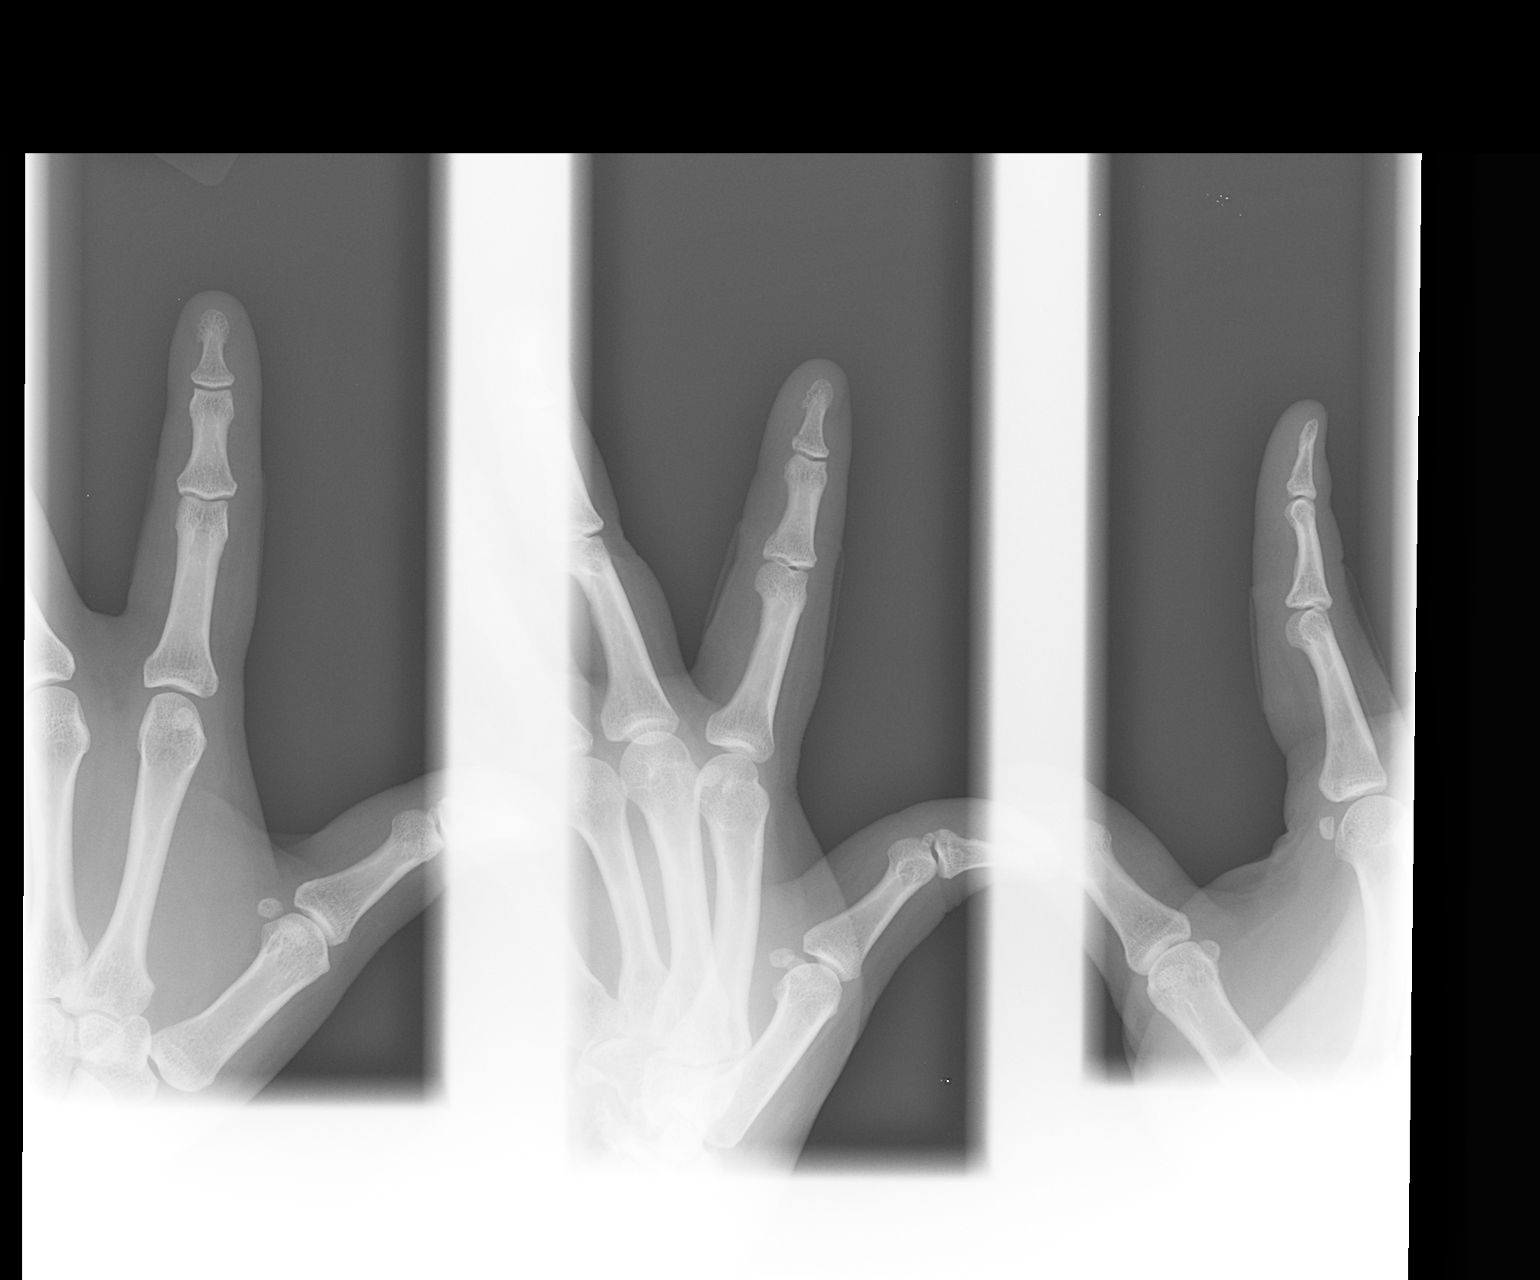

[1 of 1 positions shown; findings below may reference images not displayed]

FINDINGS: Three views of left 2nd finger submitted. No acute fracture or
subluxation. No periosteal reaction or bony erosion.
IMPRESSION: Negative.

## 2012-11-05 MED ORDER — NAPROXEN SODIUM 220 MG PO TABS: 220.0000 mg | ORAL_TABLET | Freq: Two times a day (BID) | ORAL | Status: DC

## 2012-11-05 NOTE — Patient Instructions (Addendum)
It was good to see you today.  Test(s) ordered today. Your results will be released to MyChart (or called to you) after review, usually within 72hours after test completion. If any changes need to be made, you will be notified at that same time.  Aleve as discussed for next 72h and then as needed  Sprain A sprain is a tear in one of the strong, fibrous tissues that connect your bones (ligaments). The severity of the sprain depends on how much of the ligament is torn. The tear can be either partial or complete. CAUSES  Often, sprains are a result of a fall or an injury. The force of the impact causes the fibers of your ligament to stretch beyond their normal length. This excess tension causes the fibers of your ligament to tear. SYMPTOMS  You may have some loss of motion or increased pain within your normal range of motion. Other symptoms include:  Bruising.  Tenderness.  Swelling. DIAGNOSIS  In order to diagnose a sprain, your caregiver will physically examine you to determine how torn the ligament is. Your caregiver may also suggest an X-ray exam to make sure no bones are broken. TREATMENT  If your ligament is only partially torn, treatment usually involves keeping the injured area in a fixed position (immobilization) for a short period. To do this, your caregiver will apply a bandage, cast, or splint to keep the area from moving until it heals. For a partially torn ligament, the healing process usually takes 2 to 3 weeks. If your ligament is completely torn, you may need surgery to reconnect the ligament to the bone or to reconstruct the ligament. After surgery, a cast or splint may be applied and will need to stay on for 4 to 6 weeks while your ligament heals. HOME CARE INSTRUCTIONS  Keep the injured area elevated to decrease swelling.  To ease pain and swelling, apply ice to your joint twice a day, for 2 to 3 days.  Put ice in a plastic bag.  Place a towel between your skin and the  bag.  Leave the ice on for 15 minutes.  Only take over-the-counter or prescription medicine for pain as directed by your caregiver.  Do not leave the injured area unprotected until pain and stiffness go away (usually 3 to 4 weeks).  Do not allow your cast or splint to get wet. Cover your cast or splint with a plastic bag when you shower or bathe. Do not swim.  Your caregiver may suggest exercises for you to do during your recovery to prevent or limit permanent stiffness. SEEK IMMEDIATE MEDICAL CARE IF:  Your cast or splint becomes damaged.  Your pain becomes worse. MAKE SURE YOU:  Understand these instructions.  Will watch your condition.  Will get help right away if you are not doing well or get worse. Document Released: 12/23/1999 Document Revised: 03/19/2011 Document Reviewed: 01/06/2011 [facility] Patient Information 2014 [place], [place].

## 2012-11-05 NOTE — Progress Notes (Signed)
  Subjective:    Patient ID: [name], female    DOB: 1980-01-26, 32 y.o.   MRN: [id number]  Hand Pain  The incident occurred more than 1 week ago. The incident occurred at home. The injury mechanism is unknown. The pain is present in the left fingers. The quality of the pain is described as aching. The pain does not radiate. The pain has been fluctuating since the incident. Pertinent negatives include no chest pain, muscle weakness, numbness or tingling. The symptoms are aggravated by palpation and lifting. She has tried nothing for the symptoms. The treatment provided mild relief.   Past Medical History  Diagnosis Date  . Gestational diabetes 2012  . Depression   . Cluster headaches   . GERD (gastroesophageal reflux disease)   . Hyperlipidemia   . Hypertension   . Snoring     sleep study 02/2012 with min AHI events     Review of Systems  Constitutional: Positive for fatigue.  Cardiovascular: Negative for chest pain.  Musculoskeletal: Positive for joint swelling (L index PIP). Negative for arthralgias.  Skin: Negative for color change and wound.  Neurological: Negative for tingling and numbness.  Hematological: Does not bruise/bleed easily.       Objective:   Physical Exam BP 122/78  Pulse 69  Temp(Src) 98.5 F (36.9 C) (Oral)  Wt 260 lb 12.8 oz (118.298 kg)  BMI 46.21 kg/m2  SpO2 96% Wt Readings from Last 3 Encounters:  11/05/12 260 lb 12.8 oz (118.298 kg)  10/21/12 263 lb 9.6 oz (119.568 kg)  09/01/12 256 lb (116.121 kg)   Constitutional: She is obese, but appears well-developed and well-nourished. No distress.  Musculoskeletal:left index finger with mild soft tissue swelling proximally. Tender to medial side of PIP joint but Normal range of motion, no joint effusions. No gross deformities Skin: Skin is warm and dry. No bruising or rash noted. No erythema.   Lab Results  Component Value Date   WBC 7.8 09/09/2012   HGB 13.4 09/09/2012   HCT 39.7 09/09/2012   PLT  257.0 09/09/2012   GLUCOSE 98 09/09/2012   CHOL 183 09/09/2012   TRIG 222.0* 09/09/2012   HDL 35.60* 09/09/2012   LDLDIRECT 111.5 09/09/2012   LDLCALC 114* 05/17/2011   ALT 16 09/09/2012   AST 16 09/09/2012   NA 137 09/09/2012   K 3.8 09/09/2012   CL 105 09/09/2012   CREATININE 0.9 09/09/2012   BUN 8 09/09/2012   CO2 29 09/09/2012   TSH 3.40 09/09/2012   HGBA1C 5.7 02/16/2011       Assessment & Plan:   Sprain PIP left index finger. Onset of injury 2 weeks ago Persisting pain but improved swelling  Check plain film to exclude bone fracture -hand referral planned if abnormal Advised on Band-Aid splint around affected joint Aleve for next 72 hours to help inflammation and ice as needed Patient agrees to call symptoms worse or unimproved

## 2012-12-02 ENCOUNTER — Encounter: Payer: Self-pay | Admitting: Internal Medicine

## 2012-12-02 ENCOUNTER — Ambulatory Visit (INDEPENDENT_AMBULATORY_CARE_PROVIDER_SITE_OTHER): Payer: Federal, State, Local not specified - PPO | Admitting: Internal Medicine

## 2012-12-02 VITALS — BP 118/82 | HR 80 | Temp 99.0°F | Ht 63.0 in | Wt 262.2 lb

## 2012-12-02 DIAGNOSIS — J019 Acute sinusitis, unspecified: Secondary | ICD-10-CM

## 2012-12-02 MED ORDER — LEVOFLOXACIN 250 MG PO TABS: 250.0000 mg | ORAL_TABLET | Freq: Every day | ORAL | Status: DC

## 2012-12-02 NOTE — Patient Instructions (Signed)
Please take all new medication as prescribed Please continue all other medications as before, Please have the pharmacy call with any other refills you may need. You can also take Delsym OTC for cough, and/or Mucinex (or it's generic off brand) for congestion, and tylenol as needed for pain.  Please remember to sign up for My Chart if you have not done so, as this will be important to you in the future with finding out test results, communicating by private email, and scheduling acute appointments online when needed.

## 2012-12-02 NOTE — Progress Notes (Signed)
Pre-visit discussion using our clinic review tool. No additional management support is needed unless otherwise documented below in the visit note.  

## 2012-12-02 NOTE — Progress Notes (Signed)
  Subjective:    Patient ID: Susa Griffins, female    DOB: [birth year], 32 y.o.   MRN: [id number]  HPI  Here with 2-3 days acute onset fever, facial pain, pressure, headache, general weakness and malaise, and greenish d/c, with mild ST and cough, but pt denies chest pain, wheezing, increased sob or doe, orthopnea, PND, increased LE swelling, palpitations, dizziness or syncope. Past Medical History  Diagnosis Date  . Gestational diabetes 2012  . Depression   . Cluster headaches   . GERD (gastroesophageal reflux disease)   . Hyperlipidemia   . Hypertension   . Snoring     sleep study 02/2012 with min AHI events   Past Surgical History  Procedure Laterality Date  . Ganglion removal from (l) wrist  1998  . Cesarean section  8 & 11    x's 2    reports that she has been smoking Cigarettes.  She has been smoking about 0.00 packs per day. She has never used smokeless tobacco. She reports that she drinks alcohol. She reports that she does not use illicit drugs. family history includes Alcohol abuse in her father, mother, and other; Arthritis in her father, mother, and other; Breast cancer in her paternal aunt; Cancer (age of onset: 88) in her mother; Coronary artery disease (age of onset: 33) in her mother; Diabetes in her maternal grandmother; Heart disease in her father and maternal grandmother; Hyperlipidemia in her father and paternal grandfather; Hypertension in her paternal grandfather; Kidney disease in her father; Mental illness in her father and mother; Stroke in her father and maternal grandfather. Allergies  Allergen Reactions  . Codeine   . Doxycycline    Current Outpatient Prescriptions on File Prior to Visit  Medication Sig Dispense Refill  . cyclobenzaprine (FLEXERIL) 10 MG tablet Take 10 mg by mouth 3 (three) times daily as needed.      . Norgestimate-Ethinyl Estradiol Triphasic (TRI-PREVIFEM) 0.18/0.215/0.25 MG-35 MCG tablet Take 1 tablet by mouth daily.  28 tablet  11  .  PARoxetine (PAXIL) 40 MG tablet TAKE 1 TABLET (40 MG TOTAL) BY MOUTH EVERY MORNING.  30 tablet  10  . triamcinolone lotion (KENALOG) 0.1 % Apply topically 3 (three) times daily.  60 mL  0  . meloxicam (MOBIC) 15 MG tablet Take 15 mg by mouth as needed.       No current facility-administered medications on file prior to visit.    Review of Systems All otherwise neg per pt     Objective:   Physical Exam BP 118/82  Pulse 80  Temp(Src) 99 F (37.2 C) (Oral)  Ht 5\' 3"  (1.6 m)  Wt 262 lb 4 oz (118.956 kg)  BMI 46.47 kg/m2  SpO2 97%  LMP 10/30/2012 VS noted,  Constitutional: Pt appears well-developed and well-nourished.  HENT: Head: NCAT.  Right Ear: External ear normal.  Left Ear: External ear normal.  Eyes: Conjunctivae and EOM are normal. Pupils are equal, round, and reactive to light.  Bilat tm's with mild erythema.  Max sinus areas mild tender.  Pharynx with mild erythema, no exudate Neck: Normal range of motion. Neck supple.  Cardiovascular: Normal rate and regular rhythm.   Pulmonary/Chest: Effort normal and breath sounds normal.  Neurological: Pt is alert. Not confused  Skin: Skin is warm. No erythema.  Psychiatric: Pt behavior is normal. Thought content normal.     Assessment & Plan:

## 2012-12-02 NOTE — Assessment & Plan Note (Signed)
Mild to mod, for antibx course,  to f/u any worsening symptoms or concerns 

## 2012-12-08 ENCOUNTER — Encounter: Payer: Self-pay | Admitting: Internal Medicine

## 2013-01-13 ENCOUNTER — Ambulatory Visit (INDEPENDENT_AMBULATORY_CARE_PROVIDER_SITE_OTHER): Payer: Federal, State, Local not specified - PPO | Admitting: Internal Medicine

## 2013-01-13 ENCOUNTER — Encounter: Payer: Self-pay | Admitting: Internal Medicine

## 2013-01-13 VITALS — BP 122/82 | HR 80 | Temp 98.0°F | Wt 262.0 lb

## 2013-01-13 DIAGNOSIS — E669 Obesity, unspecified: Secondary | ICD-10-CM

## 2013-01-13 DIAGNOSIS — M722 Plantar fascial fibromatosis: Secondary | ICD-10-CM

## 2013-01-13 MED ORDER — DICLOFENAC SODIUM 75 MG PO TBEC: 75.0000 mg | DELAYED_RELEASE_TABLET | Freq: Two times a day (BID) | ORAL | Status: DC

## 2013-01-13 NOTE — Patient Instructions (Addendum)
It was good to see you today.  Diclofenac 75 mg twice daily for 2 weeks, then as needed for pain Also okay to use ibuprofen in place of diclofenac if desired Your prescription(s) have been submitted to your pharmacy. Please take as directed and contact our office if you believe you are having problem(s) with the medication(s).  Use/"roll" frozen ice bottle to affected foot twice daily for 10-15 minutes over next 2 weeks, then as needed  Wear nocturnal boot support from bedtime to waking in the morning as discussed   Use shoe inserts (arch support) at all times or change to shoe brand with good arch support -do not go barefoot at anytime  Plantar Fasciitis (Heel Spur Syndrome) with Rehab The plantar fascia is a fibrous, ligament-like, soft-tissue structure that spans the bottom of the foot. Plantar fasciitis is a condition that causes pain in the foot due to inflammation of the tissue. SYMPTOMS   Pain and tenderness on the underneath side of the foot.  Pain that worsens with standing or walking. CAUSES  Plantar fasciitis is caused by irritation and injury to the plantar fascia on the underneath side of the foot. Common mechanisms of injury include:  Direct trauma to bottom of the foot.  Damage to a small nerve that runs under the foot where the main fascia attaches to the heel bone.  Stress placed on the plantar fascia due to bone spurs. RISK INCREASES WITH:   Activities that place stress on the plantar fascia (running, jumping, pivoting, or cutting).  Poor strength and flexibility.  Improperly fitted shoes.  Tight calf muscles.  Flat feet.  Failure to warm-up properly before activity.  Obesity. PREVENTION  Warm up and stretch properly before activity.  Allow for adequate recovery between workouts.  Maintain physical fitness:  Strength, flexibility, and endurance.  Cardiovascular fitness.  Maintain a health body weight.  Avoid stress on the plantar  fascia.  Wear properly fitted shoes, including arch supports for individuals who have flat feet. PROGNOSIS  If treated properly, then the symptoms of plantar fasciitis usually resolve without surgery. However, occasionally surgery is necessary. RELATED COMPLICATIONS   Recurrent symptoms that may result in a chronic condition.  Problems of the lower back that are caused by compensating for the injury, such as limping.  Pain or weakness of the foot during push-off following surgery.  Chronic inflammation, scarring, and partial or complete fascia tear, occurring more often from repeated injections. TREATMENT  Treatment initially involves the use of ice and medication to help reduce pain and inflammation. The use of strengthening and stretching exercises may help reduce pain with activity, especially stretches of the Achilles tendon. These exercises may be performed at home or with a therapist. Your caregiver may recommend that you use heel cups of arch supports to help reduce stress on the plantar fascia. Occasionally, corticosteroid injections are given to reduce inflammation. If symptoms persist for greater than 6 months despite non-surgical (conservative), then surgery may be recommended.  MEDICATION   If pain medication is necessary, then nonsteroidal anti-inflammatory medications, such as aspirin and ibuprofen, or other minor pain relievers, such as acetaminophen, are often recommended.  Do not take pain medication within 7 days before surgery.  Prescription pain relievers may be given if deemed necessary by your caregiver. Use only as directed and only as much as you need.  Corticosteroid injections may be given by your caregiver. These injections should be reserved for the most serious cases, because they may only be given  a certain number of times. HEAT AND COLD  Cold treatment (icing) relieves pain and reduces inflammation. Cold treatment should be applied for 10 to 15 minutes every  2 to 3 hours for inflammation and pain and immediately after any activity that aggravates your symptoms. Use ice packs or massage the area with a piece of ice (ice massage).  Heat treatment may be used prior to performing the stretching and strengthening activities prescribed by your caregiver, physical therapist, or athletic trainer. Use a heat pack or soak the injury in warm water. SEEK IMMEDIATE MEDICAL CARE IF:  Treatment seems to offer no benefit, or the condition worsens.  Any medications produce adverse side effects. EXERCISES RANGE OF MOTION (ROM) AND STRETCHING EXERCISES - Plantar Fasciitis (Heel Spur Syndrome) These exercises may help you when beginning to rehabilitate your injury. Your symptoms may resolve with or without further involvement from your physician, physical therapist or athletic trainer. While completing these exercises, remember:   Restoring tissue flexibility helps normal motion to return to the joints. This allows healthier, less painful movement and activity.  An effective stretch should be held for at least 30 seconds.  A stretch should never be painful. You should only feel a gentle lengthening or release in the stretched tissue. RANGE OF MOTION - Toe Extension, Flexion  Sit with your right / left leg crossed over your opposite knee.  Grasp your toes and gently pull them back toward the top of your foot. You should feel a stretch on the bottom of your toes and/or foot.  Hold this stretch for __________ seconds.  Now, gently pull your toes toward the bottom of your foot. You should feel a stretch on the top of your toes and or foot.  Hold this stretch for __________ seconds. Repeat __________ times. Complete this stretch __________ times per day.  RANGE OF MOTION - Ankle Dorsiflexion, Active Assisted  Remove shoes and sit on a chair that is preferably not on a carpeted surface.  Place right / left foot under knee. Extend your opposite leg for  support.  Keeping your heel down, slide your right / left foot back toward the chair until you feel a stretch at your ankle or calf. If you do not feel a stretch, slide your bottom forward to the edge of the chair, while still keeping your heel down.  Hold this stretch for __________ seconds. Repeat __________ times. Complete this stretch __________ times per day.  STRETCH  Gastroc, Standing  Place hands on wall.  Extend right / left leg, keeping the front knee somewhat bent.  Slightly point your toes inward on your back foot.  Keeping your right / left heel on the floor and your knee straight, shift your weight toward the wall, not allowing your back to arch.  You should feel a gentle stretch in the right / left calf. Hold this position for __________ seconds. Repeat __________ times. Complete this stretch __________ times per day. STRETCH  Soleus, Standing  Place hands on wall.  Extend right / left leg, keeping the other knee somewhat bent.  Slightly point your toes inward on your back foot.  Keep your right / left heel on the floor, bend your back knee, and slightly shift your weight over the back leg so that you feel a gentle stretch deep in your back calf.  Hold this position for __________ seconds. Repeat __________ times. Complete this stretch __________ times per day. STRETCH  Gastrocsoleus, Standing  Note: This exercise can place  a lot of stress on your foot and ankle. Please complete this exercise only if specifically instructed by your caregiver.   Place the ball of your right / left foot on a step, keeping your other foot firmly on the same step.  Hold on to the wall or a rail for balance.  Slowly lift your other foot, allowing your body weight to press your heel down over the edge of the step.  You should feel a stretch in your right / left calf.  Hold this position for __________ seconds.  Repeat this exercise with a slight bend in your right / left  knee. Repeat __________ times. Complete this stretch __________ times per day.  STRENGTHENING EXERCISES - Plantar Fasciitis (Heel Spur Syndrome)  These exercises may help you when beginning to rehabilitate your injury. They may resolve your symptoms with or without further involvement from your physician, physical therapist or athletic trainer. While completing these exercises, remember:   Muscles can gain both the endurance and the strength needed for everyday activities through controlled exercises.  Complete these exercises as instructed by your physician, physical therapist or athletic trainer. Progress the resistance and repetitions only as guided. STRENGTH - Towel Curls  Sit in a chair positioned on a non-carpeted surface.  Place your foot on a towel, keeping your heel on the floor.  Pull the towel toward your heel by only curling your toes. Keep your heel on the floor.  If instructed by your physician, physical therapist or athletic trainer, add ____________________ at the end of the towel. Repeat __________ times. Complete this exercise __________ times per day. STRENGTH - Ankle Inversion  Secure one end of a rubber exercise band/tubing to a fixed object (table, pole). Loop the other end around your foot just before your toes.  Place your fists between your knees. This will focus your strengthening at your ankle.  Slowly, pull your big toe up and in, making sure the band/tubing is positioned to resist the entire motion.  Hold this position for __________ seconds.  Have your muscles resist the band/tubing as it slowly pulls your foot back to the starting position. Repeat __________ times. Complete this exercises __________ times per day.  Document Released: 12/25/2004 Document Revised: 03/19/2011 Document Reviewed: 04/08/2008 [facility] Patient Information 2014 [place], [place].

## 2013-01-13 NOTE — Progress Notes (Signed)
Pre-visit discussion using our clinic review tool. No additional management support is needed unless otherwise documented below in the visit note.  

## 2013-01-13 NOTE — Assessment & Plan Note (Signed)
Wt Readings from Last 3 Encounters:  01/13/13 262 lb (118.842 kg)  12/02/12 262 lb 4 oz (118.956 kg)  11/05/12 260 lb 12.8 oz (118.298 kg)   Education today on importance of diet and exercise  The patient is asked to make an attempt to improve diet and exercise patterns to aid in medical management of this problem.

## 2013-01-13 NOTE — Progress Notes (Signed)
   Subjective:    Patient ID: [name], female    DOB: 29-Nov-1980, 33 y.o.   MRN: [id number]  Foot Pain Pertinent negatives include no arthralgias, fatigue or fever.    complains of R foot pain x 6 weeks On sole Worse with standing, walking, esp AM Denies precipitating injury No swelling, bruising or foreign body  Past Medical History  Diagnosis Date  . Gestational diabetes 2012  . Depression   . Cluster headaches   . GERD (gastroesophageal reflux disease)   . Hyperlipidemia   . Hypertension   . Snoring     sleep study 02/2012 with min AHI events    Review of Systems  Constitutional: Negative for fever and fatigue.  Musculoskeletal: Positive for gait problem. Negative for arthralgias and back pain.       Objective:   Physical Exam BP 122/82  Pulse 80  Temp(Src) 98 F (36.7 C) (Oral)  Wt 262 lb (118.842 kg)  SpO2 96% Wt Readings from Last 3 Encounters:  01/13/13 262 lb (118.842 kg)  12/02/12 262 lb 4 oz (118.956 kg)  11/05/12 260 lb 12.8 oz (118.298 kg)    General: Obese, no acute distress. Spouse and youngest daughter at side Musculoskeletal: right foot without gross deformity -tender over the plantar fascia insertion over arch near heel -ligamentous function intact, no joint effusions. Full range of motion, pain and arch with weightbearing activity Skin: No bruising, laceration, evidence for foreign body      Assessment & Plan:   Right plantar fasciitis Education on diagnosis reviewed with patient/family and take him education instructions provided Treat with oral anti-inflammatory -diclofenac 75 twice a day x2 weeks, then when necessary Educated on need for arch support, frozen eyes probable rolling and nocturnal boot

## 2013-01-30 ENCOUNTER — Encounter (HOSPITAL_COMMUNITY): Payer: Self-pay | Admitting: Emergency Medicine

## 2013-01-30 ENCOUNTER — Emergency Department (HOSPITAL_COMMUNITY)
Admission: EM | Admit: 2013-01-30 | Discharge: 2013-01-30 | Disposition: A | Discharge status: Home / Self Care | Payer: Federal, State, Local not specified - PPO | Attending: Emergency Medicine | Admitting: Emergency Medicine

## 2013-01-30 DIAGNOSIS — I10 Essential (primary) hypertension: Secondary | ICD-10-CM | POA: Insufficient documentation

## 2013-01-30 DIAGNOSIS — F329 Major depressive disorder, single episode, unspecified: Secondary | ICD-10-CM | POA: Insufficient documentation

## 2013-01-30 DIAGNOSIS — F17200 Nicotine dependence, unspecified, uncomplicated: Secondary | ICD-10-CM | POA: Insufficient documentation

## 2013-01-30 DIAGNOSIS — Z8719 Personal history of other diseases of the digestive system: Secondary | ICD-10-CM | POA: Insufficient documentation

## 2013-01-30 DIAGNOSIS — S161XXA Strain of muscle, fascia and tendon at neck level, initial encounter: Secondary | ICD-10-CM

## 2013-01-30 DIAGNOSIS — S139XXA Sprain of joints and ligaments of unspecified parts of neck, initial encounter: Secondary | ICD-10-CM | POA: Insufficient documentation

## 2013-01-30 DIAGNOSIS — Y929 Unspecified place or not applicable: Secondary | ICD-10-CM | POA: Insufficient documentation

## 2013-01-30 DIAGNOSIS — E669 Obesity, unspecified: Secondary | ICD-10-CM | POA: Insufficient documentation

## 2013-01-30 DIAGNOSIS — Z79899 Other long term (current) drug therapy: Secondary | ICD-10-CM | POA: Insufficient documentation

## 2013-01-30 DIAGNOSIS — Z8639 Personal history of other endocrine, nutritional and metabolic disease: Secondary | ICD-10-CM | POA: Insufficient documentation

## 2013-01-30 DIAGNOSIS — F3289 Other specified depressive episodes: Secondary | ICD-10-CM | POA: Insufficient documentation

## 2013-01-30 DIAGNOSIS — Z8632 Personal history of gestational diabetes: Secondary | ICD-10-CM | POA: Insufficient documentation

## 2013-01-30 DIAGNOSIS — IMO0002 Reserved for concepts with insufficient information to code with codable children: Secondary | ICD-10-CM | POA: Insufficient documentation

## 2013-01-30 DIAGNOSIS — Z862 Personal history of diseases of the blood and blood-forming organs and certain disorders involving the immune mechanism: Secondary | ICD-10-CM | POA: Insufficient documentation

## 2013-01-30 DIAGNOSIS — Y939 Activity, unspecified: Secondary | ICD-10-CM | POA: Insufficient documentation

## 2013-01-30 DIAGNOSIS — Z791 Long term (current) use of non-steroidal anti-inflammatories (NSAID): Secondary | ICD-10-CM | POA: Insufficient documentation

## 2013-01-30 DIAGNOSIS — X58XXXA Exposure to other specified factors, initial encounter: Secondary | ICD-10-CM | POA: Insufficient documentation

## 2013-01-30 MED ORDER — DIAZEPAM 5 MG PO TABS: 5.0000 mg | ORAL_TABLET | Freq: Four times a day (QID) | ORAL | Status: DC | PRN

## 2013-01-30 MED ORDER — DIAZEPAM 5 MG PO TABS
5.0000 mg | ORAL_TABLET | Freq: Once | ORAL | Status: AC
  Administered 2013-01-30: 5 mg via ORAL
  Filled 2013-01-30: qty 1

## 2013-01-30 MED ORDER — OXYCODONE-ACETAMINOPHEN 5-325 MG PO TABS
2.0000 | ORAL_TABLET | Freq: Once | ORAL | Status: AC
  Administered 2013-01-30: 2 via ORAL
  Filled 2013-01-30: qty 2

## 2013-01-30 MED ORDER — OXYCODONE-ACETAMINOPHEN 5-325 MG PO TABS: 1.0000 | ORAL_TABLET | Freq: Four times a day (QID) | ORAL | Status: DC | PRN

## 2013-01-30 NOTE — ED Provider Notes (Signed)
CSN: [id number]     Arrival date & time 01/30/13  2036 History  This chart was scribed for non-physician practitioner, Hazel Sams, PA-C,working with Blanchard Kelch, MD, by Marlowe Kays, ED Scribe.  This patient was seen in room WTR6/WTR6 and the patient's care was started at 9:51 PM.  Chief Complaint  Patient presents with  . Neck Pain   The history is provided by the patient. No language interpreter was used.   HPI Comments:  [name] is a 33 y.o. female, with h/o bulging discs, who presents to the Emergency Department complaining of moderate neck pain that started two days ago when she woke up that morning. She describes the pain as dull and throbbing. She reports decreased range of motion. She believes she may have injured her neck from "forceful vomiting" the night before. She states movement makes the pain worse and the pain is then sharp. Pt reports taking Flexeril and Ibuprofen with no relief. Denies any aggravating or alleviating factors. She denies SOB, trouble breathing, weakness, numbness, tingling or back pain. No other associated symptoms. Pt reports h/o depression.    Past Medical History  Diagnosis Date  . Gestational diabetes 2012  . Depression   . Cluster headaches   . GERD (gastroesophageal reflux disease)   . Hyperlipidemia   . Hypertension   . Snoring     sleep study 02/2012 with min AHI events   Past Surgical History  Procedure Laterality Date  . Ganglion removal from (l) wrist  1998  . Cesarean section  8 & 11    x's 2   Family History  Problem Relation Age of Onset  . Arthritis Mother   . Alcohol abuse Mother   . Mental illness Mother   . Cancer Mother 42    squamous - unknown primary  . Coronary artery disease Mother 109    MI with stent  . Arthritis Father   . Alcohol abuse Father   . Hyperlipidemia Father   . Heart disease Father   . Kidney disease Father   . Stroke Father   . Mental illness Father   . Diabetes Maternal  Grandmother   . Heart disease Maternal Grandmother   . Stroke Maternal Grandfather   . Hypertension Paternal Grandfather   . Hyperlipidemia Paternal Grandfather   . Arthritis Other   . Alcohol abuse Other   . Breast cancer Paternal Aunt    History  Substance Use Topics  . Smoking status: Current Every Day Smoker    Types: Cigarettes  . Smokeless tobacco: Never Used     Comment: pt states she smokes 3-5 cigarettes per day   . Alcohol Use: Yes     Comment: social   OB History   Grav Para Term Preterm Abortions TAB SAB Ect Mult Living   2 2        2      Review of Systems  Constitutional: Negative for fever.  Respiratory: Negative for shortness of breath.   Musculoskeletal: Positive for neck pain. Negative for back pain.  Neurological: Negative for weakness, numbness and headaches.  All other systems reviewed and are negative.    Allergies  Codeine and Doxycycline  Home Medications   Current Outpatient Rx  Name  Route  Sig  Dispense  Refill  . cyclobenzaprine (FLEXERIL) 10 MG tablet   Oral   Take 10 mg by mouth 3 (three) times daily as needed.         . diclofenac (VOLTAREN)  75 MG EC tablet   Oral   Take 1 tablet (75 mg total) by mouth 2 (two) times daily.   30 tablet   0   . EXPIRED: meloxicam (MOBIC) 15 MG tablet   Oral   Take 15 mg by mouth as needed.         . Norgestimate-Ethinyl Estradiol Triphasic (TRI-PREVIFEM) 0.18/0.215/0.25 MG-35 MCG tablet   Oral   Take 1 tablet by mouth daily.   28 tablet   11   . PARoxetine (PAXIL) 40 MG tablet      TAKE 1 TABLET (40 MG TOTAL) BY MOUTH EVERY MORNING.   30 tablet   10   . triamcinolone lotion (KENALOG) 0.1 %   Topical   Apply topically 3 (three) times daily.   60 mL   0    Triage Vitals: BP 150/98  Pulse 94  Temp(Src) 99.2 F (37.3 C) (Oral)  Resp 16  Ht 5\' 3"  (1.6 m)  Wt 260 lb (117.935 kg)  BMI 46.07 kg/m2  SpO2 97% Physical Exam  Nursing note and vitals reviewed. Constitutional: She is  oriented to person, place, and time. She appears well-developed and well-nourished.  Obese  HENT:  Head: Normocephalic and atraumatic.  Eyes: EOM are normal.  Neck: Neck supple.    Limited range of motion secondary to pain. Patient does have some diffuse posterior neck tenderness. There is no deformity along the midline spinous processes. No swelling or mass.  Cardiovascular: Normal rate, regular rhythm and normal heart sounds.  Exam reveals no gallop and no friction rub.   No murmur heard. Pulmonary/Chest: Effort normal and breath sounds normal. No respiratory distress. She has no wheezes. She has no rales. She exhibits no tenderness.  Musculoskeletal: Normal range of motion.  Neurological: She is alert and oriented to person, place, and time.  Skin: Skin is warm and dry.  Psychiatric: She has a normal mood and affect. Her behavior is normal.    ED Course  Procedures DIAGNOSTIC STUDIES: Oxygen Saturation is 97% on RA, normal by my interpretation.   COORDINATION OF CARE: 10:02 PM- patient seen and evaluated. She appears uncomfortable with some limitation of range of motion of the neck. She has no history of forceful or traumatic injury. I did however discuss options for her x-rays given some of her midline tenderness. Patient has refused these express understanding of the risks, benefits and alternatives. She is agreeable to treatment of symptoms. Will give medication for pain and muscle spasms prior to discharge. Pt verbalizes understanding and agrees to plan.  Patient feeling much better after Percocet and Valium in the emergency department. She has much better range of motion and is ready to be discharged home.   Medications  oxyCODONE-acetaminophen (PERCOCET/ROXICET) 5-325 MG per tablet 2 tablet (not administered)  diazepam (VALIUM) tablet 5 mg (not administered)      MDM   1. Acute strain of neck muscle      I personally performed the services described in this  documentation, which was scribed in my presence. The recorded information has been reviewed and is accurate.    Martie [name], PA-C 01/31/13 [phone]

## 2013-01-30 NOTE — ED Notes (Signed)
Pt recent had a virus that caused her to have forceful emesis on Tuesday night. Wednesday morning pt awoke with neck pain posteriorly. Throbbing, stiff and increased pain when turning head. She took flexeril that she is prescribed for bulging discs with no relief. Pt hasn't used heat or ice.

## 2013-01-30 NOTE — Discharge Instructions (Signed)
You were seen and evaluated for your neck pain. Your providers discussed options for x-rays to evaluate the bones in the spine of your neck however you did not wish to have any x-rays performed. Please use the medications prescribed to help with muscle strain and soreness. Return anytime if you have changing or worsening symptoms or if you would like further evaluation of your neck pain. Please followup with your primary care provider and spine specialist for continued evaluation and treatment.    Muscle Strain A muscle strain is an injury that occurs when a muscle is stretched beyond its normal length. Usually a small number of muscle fibers are torn when this happens. Muscle strain is rated in degrees. First-degree strains have the least amount of muscle fiber tearing and pain. Second-degree and third-degree strains have increasingly more tearing and pain.  Usually, recovery from muscle strain takes 1 2 weeks. Complete healing takes 5 6 weeks.  CAUSES  Muscle strain happens when a sudden, violent force placed on a muscle stretches it too far. This may occur with lifting, sports, or a fall.  RISK FACTORS Muscle strain is especially common in athletes.  SIGNS AND SYMPTOMS At the site of the muscle strain, there may be:  Pain.  Bruising.  Swelling.  Difficulty using the muscle due to pain or lack of normal function. DIAGNOSIS  Your health care provider will perform a physical exam and ask about your medical history. TREATMENT  Often, the best treatment for a muscle strain is resting, icing, and applying cold compresses to the injured area.  HOME CARE INSTRUCTIONS   Use the PRICE method of treatment to promote muscle healing during the first 2 3 days after your injury. The PRICE method involves:  Protecting the muscle from being injured again.  Restricting your activity and resting the injured body part.  Icing your injury. To do this, put ice in a plastic bag. Place a towel between  your skin and the bag. Then, apply the ice and leave it on from 15 20 minutes each hour. After the third day, switch to moist heat packs.  Apply compression to the injured area with a splint or elastic bandage. Be careful not to wrap it too tightly. This may interfere with blood circulation or increase swelling.  Elevate the injured body part above the level of your heart as often as you can.  Only take over-the-counter or prescription medicines for pain, discomfort, or fever as directed by your health care provider.  Warming up prior to exercise helps to prevent future muscle strains. SEEK MEDICAL CARE IF:   You have increasing pain or swelling in the injured area.  You have numbness, tingling, or a significant loss of strength in the injured area. MAKE SURE YOU:   Understand these instructions.  Will watch your condition.  Will get help right away if you are not doing well or get worse. Document Released: 12/25/2004 Document Revised: 10/15/2012 Document Reviewed: 07/24/2012 [facility] Patient Information 2014 [place], [place].

## 2013-01-31 NOTE — ED Provider Notes (Signed)
Medical screening examination/treatment/procedure(s) were performed by non-physician practitioner and as supervising physician I was immediately available for consultation/collaboration.  EKG Interpretation   None         Blanchard Kelch, MD 01/31/13 1140

## 2013-08-24 ENCOUNTER — Encounter: Payer: Self-pay | Admitting: Internal Medicine

## 2013-08-24 ENCOUNTER — Ambulatory Visit (INDEPENDENT_AMBULATORY_CARE_PROVIDER_SITE_OTHER): Payer: Federal, State, Local not specified - PPO | Admitting: Internal Medicine

## 2013-08-24 VITALS — BP 120/80 | HR 95 | Temp 98.8°F | Wt 269.1 lb

## 2013-08-24 DIAGNOSIS — H9209 Otalgia, unspecified ear: Secondary | ICD-10-CM

## 2013-08-24 DIAGNOSIS — H9201 Otalgia, right ear: Secondary | ICD-10-CM

## 2013-08-24 DIAGNOSIS — M6530 Trigger finger, unspecified finger: Secondary | ICD-10-CM

## 2013-08-24 NOTE — Patient Instructions (Signed)
Consider glucosamine sulfate 1500 mg daily for joint symptoms. Take this daily  for 3 months and then leave it off for 2 months. This will rehydrate the tendons.  Use an anti-inflammatory cream such as Aspercreme or Zostrix cream twice a day to the affected area as needed. In lieu of this warm moist compresses or  hot water bottle can be used. Do not apply ice . Please do not use Q-tips as we discussed. Should wax build up occur, please put 2-3 drops of mineral oil in the affected  ear at night to soften the wax .Cover the canal with a  cotton ball to prevent the oil from staining bed linens. In the morning fill the ear canal with hydrogen peroxide & lie in the opposite lateral decubitus position(on the side opposite the affected ear)  for 10-15 minutes. After allowing this period of time for the peroxide to dissolve the wax ;shower and use the thinnest washrag available to wick out the wax. If both ears are involved ; alternate this treatment from ear to ear each night until no wax is found on the washrag.

## 2013-08-24 NOTE — Progress Notes (Signed)
   Subjective:    Patient ID: [name], female    DOB: [birth year], 33 y.o.   MRN: [id number]  HPI  Patient seen today for right middle finger PIP joint pain as well as right ear pain.    Right middle finger PIP joint pain began about 3 weeks ago.  She has had similar issues with left hand.  Previously, she would splint with bandaids and take NSAIDS which resolved pain.  She has been taking NSAIDS with relief but has been unable to splint finger during day due to working. No precipitating injury. Pain worsened by lifting.   Right ear pain began this morning. No other sinus symptoms.  She uses Q-tips to clean her ears.  No drainage or hearing change.   Review of Systems  Constitutional: Negative for fever, chills and activity change.  HENT: Negative for congestion, ear discharge, hearing loss, postnasal drip, rhinorrhea, sinus pressure, sneezing and sore throat.   Eyes: Negative for discharge and itching.  Musculoskeletal: Positive for joint swelling (right middle finger PIP).  Skin: Negative for rash and wound.       Objective:   Physical Exam  Constitutional: She is oriented to person, place, and time. She appears well-developed and well-nourished. No distress.  HENT:  Head: Normocephalic and atraumatic.  Right Ear: Hearing and tympanic membrane normal.  Left Ear: Hearing and tympanic membrane normal.  Nose: Nose normal.  Mouth/Throat: Oropharynx is clear and moist. No oropharyngeal exudate.  Right ear canal with erythema  Eyes: Conjunctivae and EOM are normal. Pupils are equal, round, and reactive to light. Right eye exhibits no discharge. Left eye exhibits no discharge.  Neck: Normal range of motion. Neck supple. No thyromegaly present.  Musculoskeletal: Normal range of motion. She exhibits edema (right middle finger PIP). She exhibits no tenderness.  Lymphadenopathy:    She has no cervical adenopathy.  Neurological: She is alert and oriented to person, place, and time.    Skin: Skin is warm and dry. No rash noted. She is not diaphoretic.  Psychiatric: She has a normal mood and affect. Her behavior is normal. Judgment and thought content normal.        Assessment & Plan:

## 2013-08-24 NOTE — Progress Notes (Signed)
   Subjective:    Patient ID: [name], female    DOB: [birth year], 33 y.o.   MRN: [id number]  HPI   She has had some discomfort at the third right PIP joint for approximately 3 weeks. Previously she had had similar issues on the left which was treated with splinting and nonsteroidals which resolved the issue. 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  This morning she developed pain in the right ear after using Q-tips to clean the ear. There is no associated discharge.  She has no other significant musculoskeletal symptoms.  She has no upper respiratory tract infection symptoms.    Review of Systems  Frontal headache, facial pain , nasal purulence, dental pain, sore throat , otic pain or otic discharge denied. No fever , chills or sweats.       Objective:   Physical Exam   Pertinent or positive findings include: There's a tiny area of localized blood in the right canal. The tympanic membrane is slightly dull. There is no wax collection. The left canal & tympanic membrane are normal. She has mild crepitus of the knees without associated effusion There is some triggering of the third right finger. Strength, tone range of motion of all extremities are normal.  General appearance :adequately nourished; in no distress. Eyes: No conjunctival inflammation or scleral icterus is present. Oral exam: Dental hygiene is good. Lips and gums are healthy appearing.There is no oropharyngeal erythema or exudate noted.  Heart:  Normal rate and regular rhythm. S1 and S2 normal without gallop, murmur, click, rub or other extra sounds   Lungs:Chest clear to auscultation; no wheezes, rhonchi,rales ,or rubs present.No increased work of breathing.  Abdomen: bowel sounds normal, soft and non-tender without masses, organomegaly or hernias noted.  No guarding or rebound.  Skin:Warm & dry.  Intact without suspicious lesions or rashes ;  no jaundice or tenting. Lymphatic: No lymphadenopathy is noted about the head, neck            Assessment & Plan:  #1 trigger finger 3 rd R finger #2 otic trauma from Q tip See  recommendations documented in AVS

## 2013-08-24 NOTE — Progress Notes (Signed)
Pre visit review using our clinic review tool, if applicable. No additional management support is needed unless otherwise documented below in the visit note. 

## 2013-10-22 ENCOUNTER — Other Ambulatory Visit: Payer: Self-pay

## 2013-10-22 MED ORDER — PAROXETINE HCL 40 MG PO TABS: ORAL_TABLET | ORAL | Status: DC

## 2013-10-23 ENCOUNTER — Other Ambulatory Visit: Payer: Self-pay | Admitting: *Deleted

## 2013-10-23 MED ORDER — PAROXETINE HCL 40 MG PO TABS: ORAL_TABLET | ORAL | Status: DC

## 2013-11-09 ENCOUNTER — Encounter: Payer: Self-pay | Admitting: Internal Medicine

## 2013-12-07 ENCOUNTER — Encounter: Payer: Self-pay | Admitting: Internal Medicine

## 2013-12-10 ENCOUNTER — Other Ambulatory Visit: Payer: Self-pay

## 2013-12-10 MED ORDER — PAROXETINE HCL 20 MG PO TABS: 20.0000 mg | ORAL_TABLET | Freq: Every day | ORAL | Status: DC

## 2014-01-06 ENCOUNTER — Encounter: Payer: Federal, State, Local not specified - PPO | Admitting: Internal Medicine

## 2014-01-07 ENCOUNTER — Ambulatory Visit (INDEPENDENT_AMBULATORY_CARE_PROVIDER_SITE_OTHER): Payer: Federal, State, Local not specified - PPO | Admitting: Family

## 2014-01-07 ENCOUNTER — Encounter: Payer: Self-pay | Admitting: Family

## 2014-01-07 ENCOUNTER — Other Ambulatory Visit (INDEPENDENT_AMBULATORY_CARE_PROVIDER_SITE_OTHER): Payer: Federal, State, Local not specified - PPO

## 2014-01-07 VITALS — BP 120/84 | HR 77 | Temp 98.2°F | Resp 18 | Ht 63.0 in | Wt 270.6 lb

## 2014-01-07 DIAGNOSIS — Z0000 Encounter for general adult medical examination without abnormal findings: Secondary | ICD-10-CM

## 2014-01-07 DIAGNOSIS — F329 Major depressive disorder, single episode, unspecified: Secondary | ICD-10-CM

## 2014-01-07 DIAGNOSIS — M545 Low back pain, unspecified: Secondary | ICD-10-CM

## 2014-01-07 DIAGNOSIS — F32A Depression, unspecified: Secondary | ICD-10-CM

## 2014-01-07 DIAGNOSIS — E669 Obesity, unspecified: Secondary | ICD-10-CM

## 2014-01-07 LAB — BASIC METABOLIC PANEL
BUN: 12 mg/dL (ref 6–23)
CALCIUM: 9.5 mg/dL (ref 8.4–10.5)
CHLORIDE: 105 meq/L (ref 96–112)
CO2: 29 meq/L (ref 19–32)
Creatinine, Ser: 0.8 mg/dL (ref 0.4–1.2)
GFR: 87.11 mL/min (ref >60.00)
Glucose, Bld: 93 mg/dL (ref 70–99)
POTASSIUM: 4.3 meq/L (ref 3.5–5.1)
SODIUM: 139 meq/L (ref 135–145)

## 2014-01-07 LAB — CBC
HEMATOCRIT: 41.7 % (ref 36.0–46.0)
HEMOGLOBIN: 13.8 g/dL (ref 12.0–15.0)
MCHC: 33.1 g/dL (ref 30.0–36.0)
MCV: 87.2 fl (ref 78.0–100.0)
PLATELETS: 271 10*3/uL (ref 150.0–400.0)
RBC: 4.78 Mil/uL (ref 3.87–5.11)
RDW: 13.2 % (ref 11.5–15.5)
WBC: 7.7 10*3/uL (ref 4.0–10.5)

## 2014-01-07 LAB — LIPID PANEL
Cholesterol: 204 mg/dL — ABNORMAL HIGH (ref 0–200)
HDL: 31.7 mg/dL — ABNORMAL LOW (ref >39.00)
LDL Cholesterol: 133 mg/dL — ABNORMAL HIGH (ref 0–99)
NONHDL: 172.3
Total CHOL/HDL Ratio: 6
Triglycerides: 197 mg/dL — ABNORMAL HIGH (ref 0.0–149.0)
VLDL: 39.4 mg/dL (ref 0.0–40.0)

## 2014-01-07 LAB — HEMOGLOBIN A1C: Hgb A1c MFr Bld: 6.1 % (ref 4.6–6.5)

## 2014-01-07 LAB — TSH: TSH: 2.5 u[IU]/mL (ref 0.35–4.50)

## 2014-01-07 MED ORDER — DIAZEPAM 5 MG PO TABS: 5.0000 mg | ORAL_TABLET | Freq: Four times a day (QID) | ORAL | Status: DC | PRN

## 2014-01-07 MED ORDER — CYCLOBENZAPRINE HCL 10 MG PO TABS: 10.0000 mg | ORAL_TABLET | Freq: Three times a day (TID) | ORAL | Status: DC | PRN

## 2014-01-07 MED ORDER — MELOXICAM 15 MG PO TABS: 15.0000 mg | ORAL_TABLET | ORAL | Status: DC | PRN

## 2014-01-07 NOTE — Progress Notes (Signed)
Subjective:    Patient ID: [name], female    DOB: 08/01/1980, 33 y.o.   MRN: [id number]  Chief Complaint  Patient presents with  . CPE    Fasting    HPI:  [name] is a 33 y.o. female who presents today for an annual wellness visit.   1) Health Maintenance - Overall feels her health is fair, with no concerns  Diet - regular diet - eats fruits, vegetables and continues to eat a lot of startches Exercise - not very much - no real structured plan Sleep - improved with mouth guard; easier to wake up in the morning.   2) Preventative Exams / Immunizations:  Dental -- Up to date Vision -- Up to date   Health Maintenance  Topic Date Due  . INFLUENZA VACCINE  08/08/2013  . TETANUS/TDAP  01/10/2015  . PAP SMEAR  02/13/2015    Immunization History  Administered Date(s) Administered  . Influenza Whole 10/30/2011  . Influenza,inj,Quad PF,36+ Mos 10/21/2012  . Td 01/08/2005   3) Depression - Does have slightly increased amount of depression with decreased dose of Paxil. Denies any thoughts of suicide.    Allergies  Allergen Reactions  . Codeine     STOMACH CRAMPING  . Doxycycline Nausea And Vomiting    Current Outpatient Prescriptions  Medication Sig Dispense Refill  . diazepam (VALIUM) 5 MG tablet Take 1 tablet (5 mg total) by mouth every 6 (six) hours as needed for anxiety (spasms). 5 tablet 0  . PARoxetine (PAXIL) 20 MG tablet Take 1 tablet (20 mg total) by mouth daily. 90 tablet 1  . cyclobenzaprine (FLEXERIL) 10 MG tablet Take 1 tablet (10 mg total) by mouth 3 (three) times daily as needed. 30 tablet 0  . meloxicam (MOBIC) 15 MG tablet Take 1 tablet (15 mg total) by mouth as needed. 30 tablet 0   No current facility-administered medications for this visit.    Past Medical History  Diagnosis Date  . Depression   . Cluster headaches   . GERD (gastroesophageal reflux disease)   . Hyperlipidemia   . Hypertension   . Snoring     sleep study  02/2012 with min AHI events  . Gestational diabetes 2012    Past Surgical History  Procedure Laterality Date  . Ganglion removal from (l) wrist  1998  . Cesarean section  8 & 11    x's 2    Family History  Problem Relation Age of Onset  . Arthritis Mother   . Alcohol abuse Mother   . Mental illness Mother   . Cancer Mother 22    squamous - unknown primary  . Coronary artery disease Mother 63    MI with stent  . Arthritis Father   . Alcohol abuse Father   . Hyperlipidemia Father   . Heart disease Father   . Kidney disease Father   . Stroke Father   . Mental illness Father   . Diabetes Maternal Grandmother   . Heart disease Maternal Grandmother   . Stroke Maternal Grandfather   . Hypertension Paternal Grandfather   . Hyperlipidemia Paternal Grandfather   . Arthritis Other   . Alcohol abuse Other   . Breast cancer Paternal Aunt     History   Social History  . Marital Status: Married    Spouse Name: N/A    Number of Children: 2  . Years of Education: 16   Occupational History  . Not on file.  Social History Main Topics  . Smoking status: Current Every Day Smoker -- 0.33 packs/day for 10 years    Types: Cigarettes  . Smokeless tobacco: Never Used  . Alcohol Use: 0.6 oz/week    1 Glasses of wine per week     Comment: once a week  . Drug Use: No  . Sexual Activity: Yes    Birth Control/ Protection: Pill   Other Topics Concern  . 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 HENT: Head: Denies headache or neck pain Ears: Denies changes in hearing, ringing in ears, earache, drainage Nose: Denies discharge, stuffiness, itching, nosebleed, sinus pain Throat: Denies sore throat, hoarseness, dry mouth, sores, thrush Eyes: Denies loss/changes in  vision, pain, redness, blurry/double vision, flashing lights Cardiovascular: Denies chest pain/discomfort, tightness, palpitations, shortness of breath with activity, difficulty lying down, swelling, sudden awakening with shortness of breath Respiratory: Denies shortness of breath, cough, sputum production, wheezing Gastrointestinal: Denies dysphasia, heartburn, change in appetite, nausea, change in bowel habits, rectal bleeding, constipation, diarrhea, yellow skin or eyes Genitourinary: Denies frequency, urgency, burning/pain, blood in urine, incontinence, change in urinary strength. Musculoskeletal: Denies muscle/joint pain, stiffness, redness or swelling of joints, trauma Has experienced some lower back pain Skin: Denies rashes, lumps, itching, dryness, color changes, or hair/nail changes Neurological: Denies dizziness, fainting, seizures, weakness, numbness, tingling, tremor Psychiatric - Denies nervousness, stress, or memory loss Endocrine: Denies heat or cold intolerance, sweating, frequent urination, excessive thirst, changes in appetite Hematologic: Denies ease of bruising or bleeding     Objective:    BP 120/84 mmHg  Pulse 77  Temp(Src) 98.2 F (36.8 C) (Oral)  Resp 18  Ht 5\' 3"  (1.6 m)  Wt 270 lb 9.6 oz (122.743 kg)  BMI 47.95 kg/m2  SpO2 96% Nursing note and vital signs reviewed.  Physical Exam  Constitutional: She is oriented to person, place, and time. She appears well-developed and well-nourished.  HENT:  Head: Normocephalic.  Right Ear: Hearing, tympanic membrane, external ear and ear canal normal.  Left Ear: Hearing, tympanic membrane, external ear and ear canal normal.  Nose: Nose normal.  Mouth/Throat: Uvula is midline, oropharynx is clear and moist and mucous membranes are normal.  Eyes: Conjunctivae and EOM are normal. Pupils are equal, round, and reactive to light.  Neck: Neck supple. No JVD present. No tracheal deviation present. No thyromegaly present.    Cardiovascular: Normal rate, regular rhythm, normal heart sounds and intact distal pulses.   Pulmonary/Chest: Effort normal and breath sounds normal.  Abdominal: Soft. Bowel sounds are normal. She exhibits no distension and no mass. There is no tenderness. There is no rebound and no guarding.  Musculoskeletal: Normal range of motion. She exhibits no edema or tenderness.  Lymphadenopathy:    She has no cervical adenopathy.  Neurological: She is alert and oriented to person, place, and time. She has normal reflexes. No cranial nerve deficit. She exhibits normal muscle tone. Coordination normal.  Skin: Skin is warm and dry.  Psychiatric: She has a normal mood and affect. Her behavior is normal. Judgment and thought content normal.       Assessment & Plan:

## 2014-01-07 NOTE — Patient Instructions (Addendum)
Thank you for choosing  HealthCare.  Summary/Instructions:  Your prescription(s) have been submitted to your pharmacy. Please take as directed and contact our office if you believe you are having problem(s) with the medication(s).  Please stop by the lab on the basement level of the building for your blood work. Your results will be released to MyChart (or called to you) after review, usually within 72hours after test completion. If any changes need to be made, you will be notified at that same time.  Health Maintenance Adopting a healthy lifestyle and getting preventive care can go a long way to promote health and wellness. Talk with your health care provider about what schedule of regular examinations is right for you. This is a good chance for you to check in with your provider about disease prevention and staying healthy. In between checkups, there are plenty of things you can do on your own. Experts have done a lot of research about which lifestyle changes and preventive measures are most likely to keep you healthy. Ask your health care provider for more information. WEIGHT AND DIET  Eat a healthy diet  Be sure to include plenty of vegetables, fruits, low-fat dairy products, and lean protein.  Do not eat a lot of foods high in solid fats, added sugars, or salt.  Get regular exercise. This is one of the most important things you can do for your health.  Most adults should exercise for at least 150 minutes each week. The exercise should increase your heart rate and make you sweat (moderate-intensity exercise).  Most adults should also do strengthening exercises at least twice a week. This is in addition to the moderate-intensity exercise.  Maintain a healthy weight  Body mass index (BMI) is a measurement that can be used to identify possible weight problems. It estimates body fat based on height and weight. Your health care provider can help determine your BMI and help you achieve  or maintain a healthy weight.  For females 20 years of age and older:   A BMI below 18.5 is considered underweight.  A BMI of 18.5 to 24.9 is normal.  A BMI of 25 to 29.9 is considered overweight.  A BMI of 30 and above is considered obese.  Watch levels of cholesterol and blood lipids  You should start having your blood tested for lipids and cholesterol at 34 years of age, then have this test every 5 years.  You may need to have your cholesterol levels checked more often if:  Your lipid or cholesterol levels are high.  You are older than 34 years of age.  You are at high risk for heart disease.  CANCER SCREENING   Lung Cancer  Lung cancer screening is recommended for adults 55-80 years old who are at high risk for lung cancer because of a history of smoking.  A yearly low-dose CT scan of the lungs is recommended for people who:  Currently smoke.  Have quit within the past 15 years.  Have at least a 30-pack-year history of smoking. A pack year is smoking an average of one pack of cigarettes a day for 1 year.  Yearly screening should continue until it has been 15 years since you quit.  Yearly screening should stop if you develop a health problem that would prevent you from having lung cancer treatment.  Breast Cancer  Practice breast self-awareness. This means understanding how your breasts normally appear and feel.  It also means doing regular breast self-exams. Let your   health care provider know about any changes, no matter how small.  If you are in your 20s or 30s, you should have a clinical breast exam (CBE) by a health care provider every 1-3 years as part of a regular health exam.  If you are 40 or older, have a CBE every year. Also consider having a breast X-ray (mammogram) every year.  If you have a family history of breast cancer, talk to your health care provider about genetic screening.  If you are at high risk for breast cancer, talk to your health  care provider about having an MRI and a mammogram every year.  Breast cancer gene (BRCA) assessment is recommended for women who have family members with BRCA-related cancers. BRCA-related cancers include:  Breast.  Ovarian.  Tubal.  Peritoneal cancers.  Results of the assessment will determine the need for genetic counseling and BRCA1 and BRCA2 testing. Cervical Cancer Routine pelvic examinations to screen for cervical cancer are no longer recommended for nonpregnant women who are considered low risk for cancer of the pelvic organs (ovaries, uterus, and vagina) and who do not have symptoms. A pelvic examination may be necessary if you have symptoms including those associated with pelvic infections. Ask your health care provider if a screening pelvic exam is right for you.   The Pap test is the screening test for cervical cancer for women who are considered at risk.  If you had a hysterectomy for a problem that was not cancer or a condition that could lead to cancer, then you no longer need Pap tests.  If you are older than 65 years, and you have had normal Pap tests for the past 10 years, you no longer need to have Pap tests.  If you have had past treatment for cervical cancer or a condition that could lead to cancer, you need Pap tests and screening for cancer for at least 20 years after your treatment.  If you no longer get a Pap test, assess your risk factors if they change (such as having a new sexual partner). This can affect whether you should start being screened again.  Some women have medical problems that increase their chance of getting cervical cancer. If this is the case for you, your health care provider may recommend more frequent screening and Pap tests.  The human papillomavirus (HPV) test is another test that may be used for cervical cancer screening. The HPV test looks for the virus that can cause cell changes in the cervix. The cells collected during the Pap test can  be tested for HPV.  The HPV test can be used to screen women 30 years of age and older. Getting tested for HPV can extend the interval between normal Pap tests from three to five years.  An HPV test also should be used to screen women of any age who have unclear Pap test results.  After 34 years of age, women should have HPV testing as often as Pap tests.  Colorectal Cancer  This type of cancer can be detected and often prevented.  Routine colorectal cancer screening usually begins at 34 years of age and continues through 34 years of age.  Your health care provider may recommend screening at an earlier age if you have risk factors for colon cancer.  Your health care provider may also recommend using home test kits to check for hidden blood in the stool.  A small camera at the end of a tube can be used to examine   your colon directly (sigmoidoscopy or colonoscopy). This is done to check for the earliest forms of colorectal cancer.  Routine screening usually begins at age 50.  Direct examination of the colon should be repeated every 5-10 years through 34 years of age. However, you may need to be screened more often if early forms of precancerous polyps or small growths are found. Skin Cancer  Check your skin from head to toe regularly.  Tell your health care provider about any new moles or changes in moles, especially if there is a change in a mole's shape or color.  Also tell your health care provider if you have a mole that is larger than the size of a pencil eraser.  Always use sunscreen. Apply sunscreen liberally and repeatedly throughout the day.  Protect yourself by wearing long sleeves, pants, a wide-brimmed hat, and sunglasses whenever you are outside. HEART DISEASE, DIABETES, AND HIGH BLOOD PRESSURE   Have your blood pressure checked at least every 1-2 years. High blood pressure causes heart disease and increases the risk of stroke.  If you are between 55 years and 79  years old, ask your health care provider if you should take aspirin to prevent strokes.  Have regular diabetes screenings. This involves taking a blood sample to check your fasting blood sugar level.  If you are at a normal weight and have a low risk for diabetes, have this test once every three years after 34 years of age.  If you are overweight and have a high risk for diabetes, consider being tested at a younger age or more often. PREVENTING INFECTION  Hepatitis B  If you have a higher risk for hepatitis B, you should be screened for this virus. You are considered at high risk for hepatitis B if:  You were born in a country where hepatitis B is common. Ask your health care provider which countries are considered high risk.  Your parents were born in a high-risk country, and you have not been immunized against hepatitis B (hepatitis B vaccine).  You have HIV or AIDS.  You use needles to inject street drugs.  You live with someone who has hepatitis B.  You have had sex with someone who has hepatitis B.  You get hemodialysis treatment.  You take certain medicines for conditions, including cancer, organ transplantation, and autoimmune conditions. Hepatitis C  Blood testing is recommended for:  Everyone born from 1945 through 1965.  Anyone with known risk factors for hepatitis C. Sexually transmitted infections (STIs)  You should be screened for sexually transmitted infections (STIs) including gonorrhea and chlamydia if:  You are sexually active and are younger than 34 years of age.  You are older than 34 years of age and your health care provider tells you that you are at risk for this type of infection.  Your sexual activity has changed since you were last screened and you are at an increased risk for chlamydia or gonorrhea. Ask your health care provider if you are at risk.  If you do not have HIV, but are at risk, it may be recommended that you take a prescription  medicine daily to prevent HIV infection. This is called pre-exposure prophylaxis (PrEP). You are considered at risk if:  You are sexually active and do not regularly use condoms or know the HIV status of your partner(s).  You take drugs by injection.  You are sexually active with a partner who has HIV. Talk with your health care provider about whether   you are at high risk of being infected with HIV. If you choose to begin PrEP, you should first be tested for HIV. You should then be tested every 3 months for as long as you are taking PrEP.  PREGNANCY   If you are premenopausal and you may become pregnant, ask your health care provider about preconception counseling.  If you may become pregnant, take 400 to 800 micrograms (mcg) of folic acid every day.  If you want to prevent pregnancy, talk to your health care provider about birth control (contraception). OSTEOPOROSIS AND MENOPAUSE   Osteoporosis is a disease in which the bones lose minerals and strength with aging. This can result in serious bone fractures. Your risk for osteoporosis can be identified using a bone density scan.  If you are 65 years of age or older, or if you are at risk for osteoporosis and fractures, ask your health care provider if you should be screened.  Ask your health care provider whether you should take a calcium or vitamin D supplement to lower your risk for osteoporosis.  Menopause may have certain physical symptoms and risks.  Hormone replacement therapy may reduce some of these symptoms and risks. Talk to your health care provider about whether hormone replacement therapy is right for you.  HOME CARE INSTRUCTIONS   Schedule regular health, dental, and eye exams.  Stay current with your immunizations.   Do not use any tobacco products including cigarettes, chewing tobacco, or electronic cigarettes.  If you are pregnant, do not drink alcohol.  If you are breastfeeding, limit how much and how often you  drink alcohol.  Limit alcohol intake to no more than 1 drink per day for nonpregnant women. One drink equals 12 ounces of beer, 5 ounces of wine, or 1 ounces of hard liquor.  Do not use street drugs.  Do not share needles.  Ask your health care provider for help if you need support or information about quitting drugs.  Tell your health care provider if you often feel depressed.  Tell your health care provider if you have ever been abused or do not feel safe at home. Document Released: 07/10/2010 Document Revised: 05/11/2013 Document Reviewed: 11/26/2012 [employer] Patient Information 2015 [employer]. This information is not intended to replace advice given to you by your health care provider. Make sure you discuss any questions you have with your health care provider.    

## 2014-01-07 NOTE — Assessment & Plan Note (Signed)
See plan in routine physical.

## 2014-01-07 NOTE — Progress Notes (Signed)
Pre visit review using our clinic review tool, if applicable. No additional management support is needed unless otherwise documented below in the visit note. 

## 2014-01-07 NOTE — Assessment & Plan Note (Signed)
Slightly increased with decrease of Paxil. Remains stable at this time. Patient is aware to discontinue medications if pregnancy is obtained.

## 2014-01-07 NOTE — Assessment & Plan Note (Addendum)
1) Anticipatory Guidance: Discussed importance of wearing a seatbelt while driving and not texting while driving; changing batteries in smoke detector at least once annually; wearing suntan lotion when outside; eating a balanced and moderate diet; getting physical activity at least 30 minutes per day.  2) Immunizations / Screenings / Labs:  All immunizations are up-to-date per recommendations. All screenings are up-to-date per recommendations. Obtain CBC, BMET, Lipid profile and TSH, A1c.  Overall well exam. Discussed risk factors of obesity and tobacco use. Recommend discontinuation of tobacco as soon as possible as patient is attempting to become pregnant. Goal weight loss would be approximately 5-10% of her body weight which can be obtained through increasing nutrient density and increasing physical activity. Patient is aware of the need to discontinue Valium and Paxil in the event of pregnancy. Follow up prevention exam in 1 year or sooner pending lab work.

## 2014-01-11 ENCOUNTER — Encounter: Payer: Federal, State, Local not specified - PPO | Admitting: Family

## 2014-02-01 ENCOUNTER — Ambulatory Visit (INDEPENDENT_AMBULATORY_CARE_PROVIDER_SITE_OTHER)
Admission: RE | Admit: 2014-02-01 | Discharge: 2014-02-01 | Disposition: A | Discharge status: Home / Self Care | Payer: Federal, State, Local not specified - PPO | Source: Ambulatory Visit | Attending: Family | Admitting: Family

## 2014-02-01 ENCOUNTER — Encounter: Payer: Self-pay | Admitting: Family

## 2014-02-01 DIAGNOSIS — M545 Low back pain, unspecified: Secondary | ICD-10-CM

## 2014-02-01 IMAGING — CR DG LUMBAR SPINE COMPLETE 4+V
5 series · 5 of 5 positions shown · non-contrast
Comparison: None.

CLINICAL DATA: Chronic low back pain.  Initial evaluation.

EXAM:
LUMBAR SPINE - COMPLETE 4+ VIEW

[view not recorded (1 of 5)]
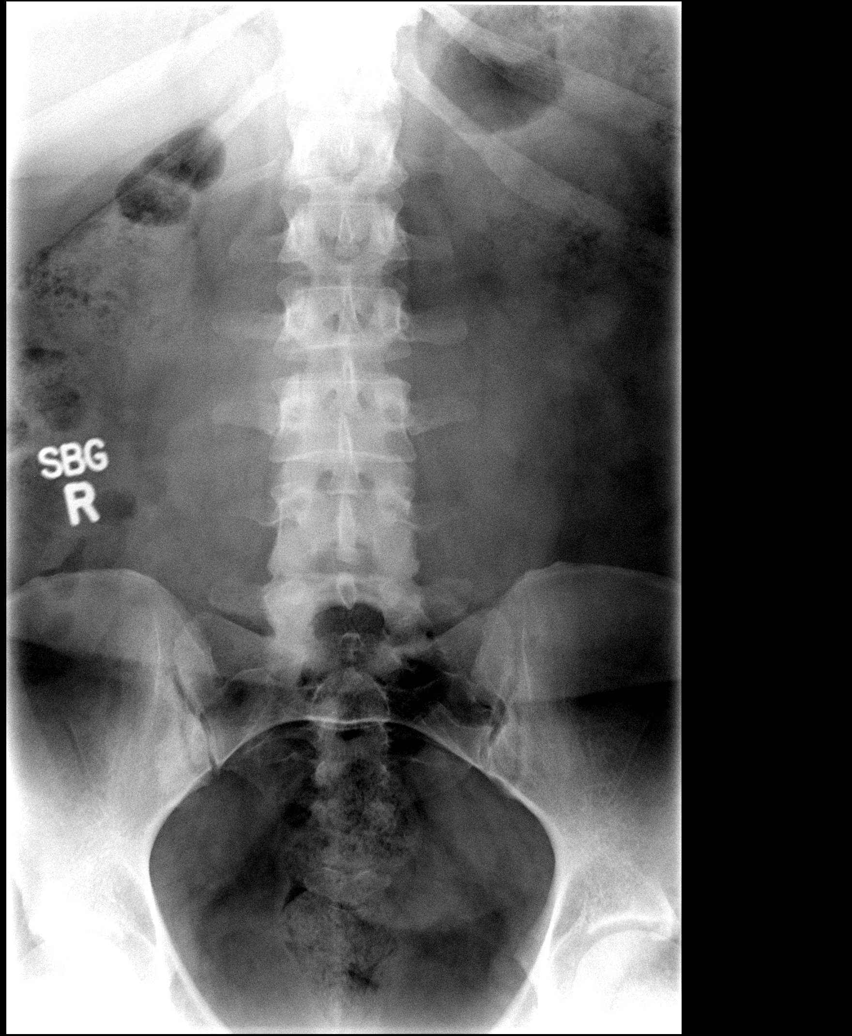

[view not recorded (2 of 5)]
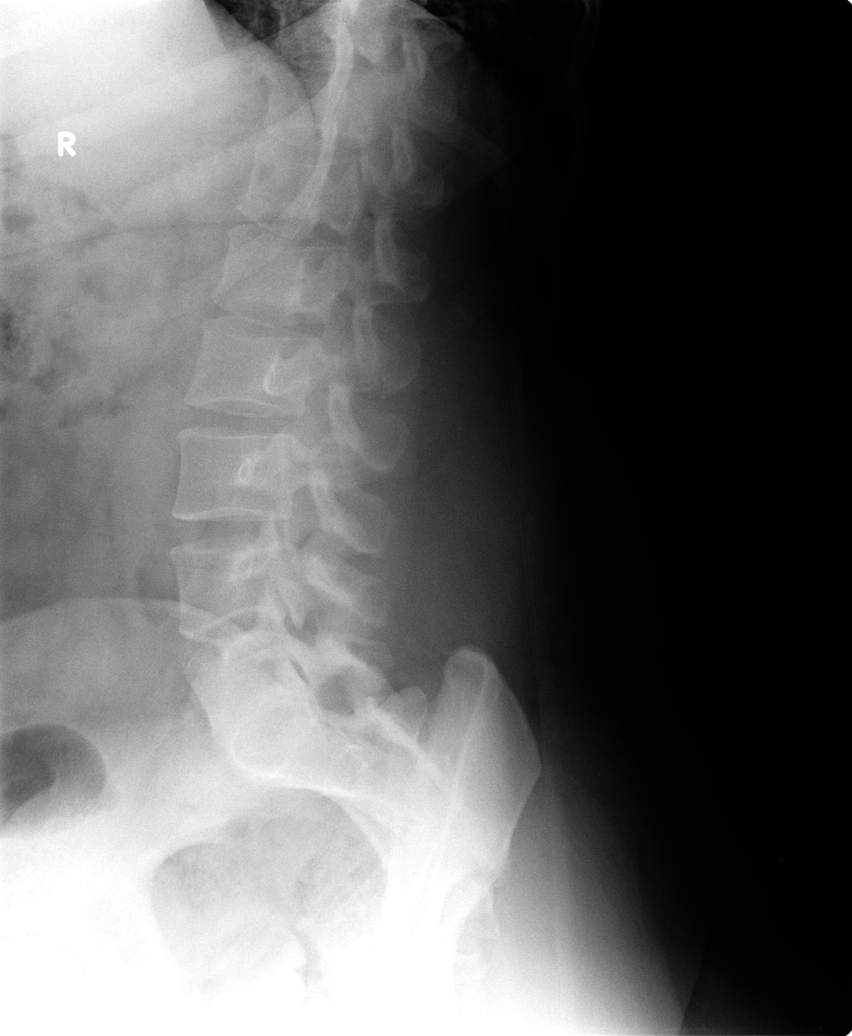

[view not recorded (3 of 5)]
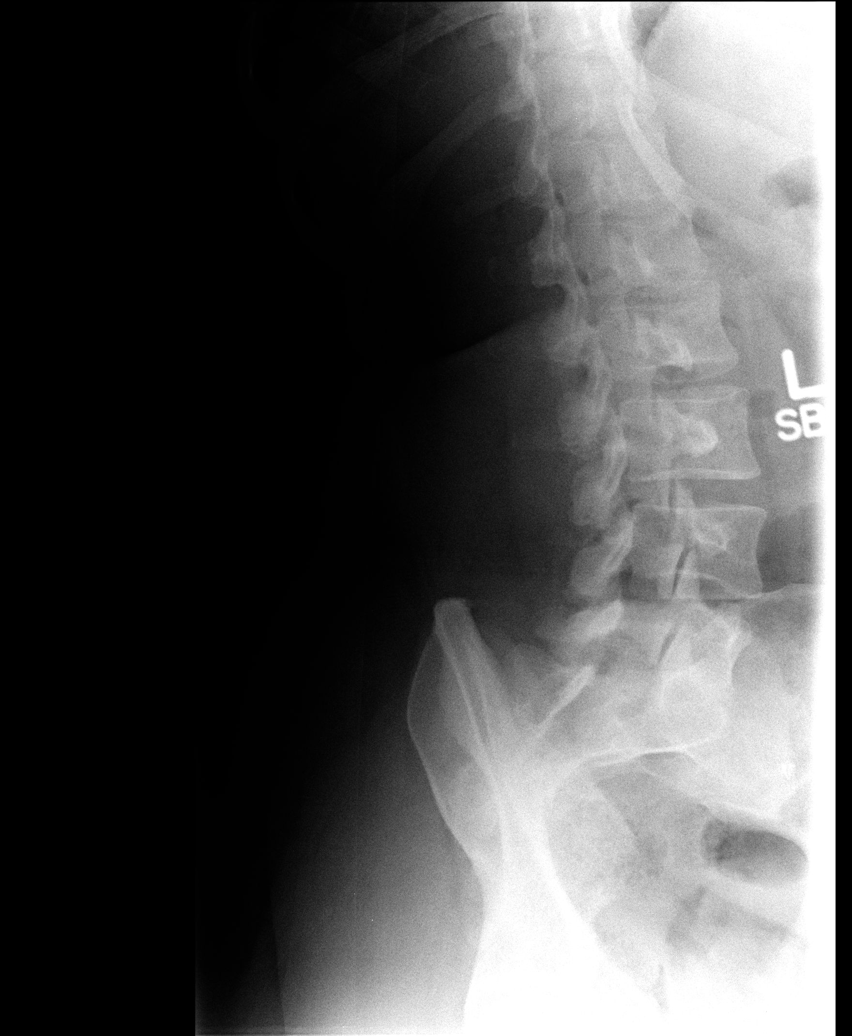

[view not recorded (4 of 5)]
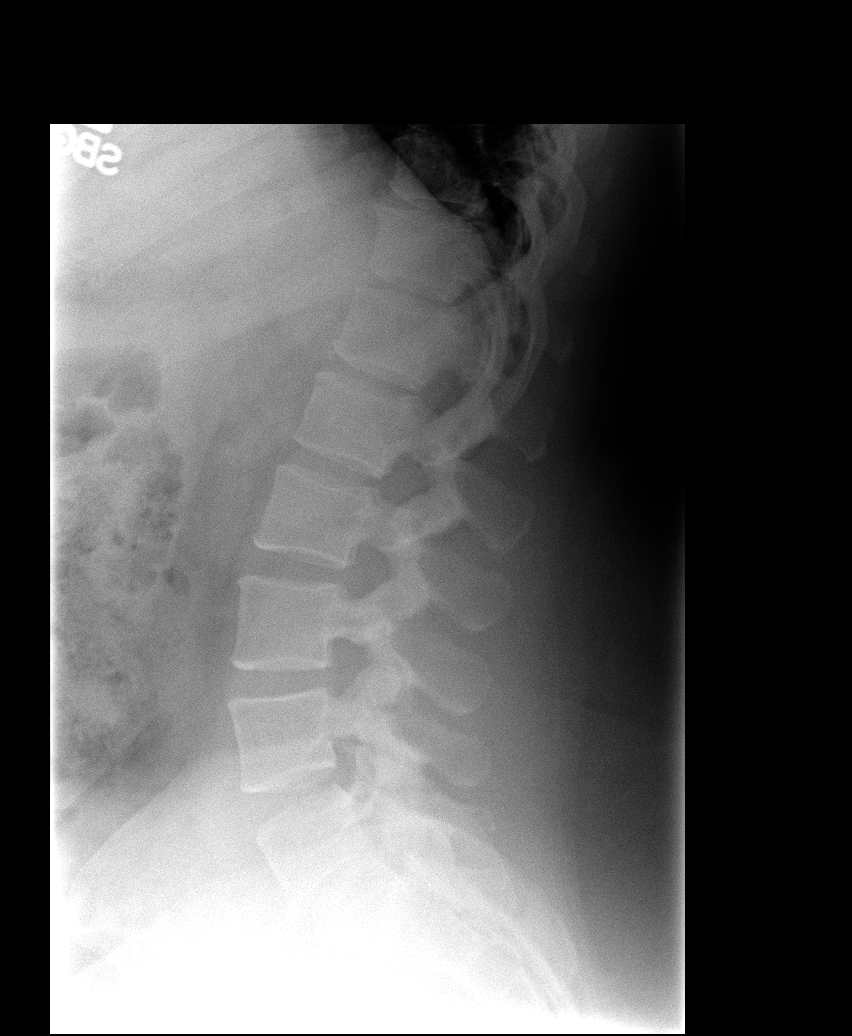

[view not recorded (5 of 5)]
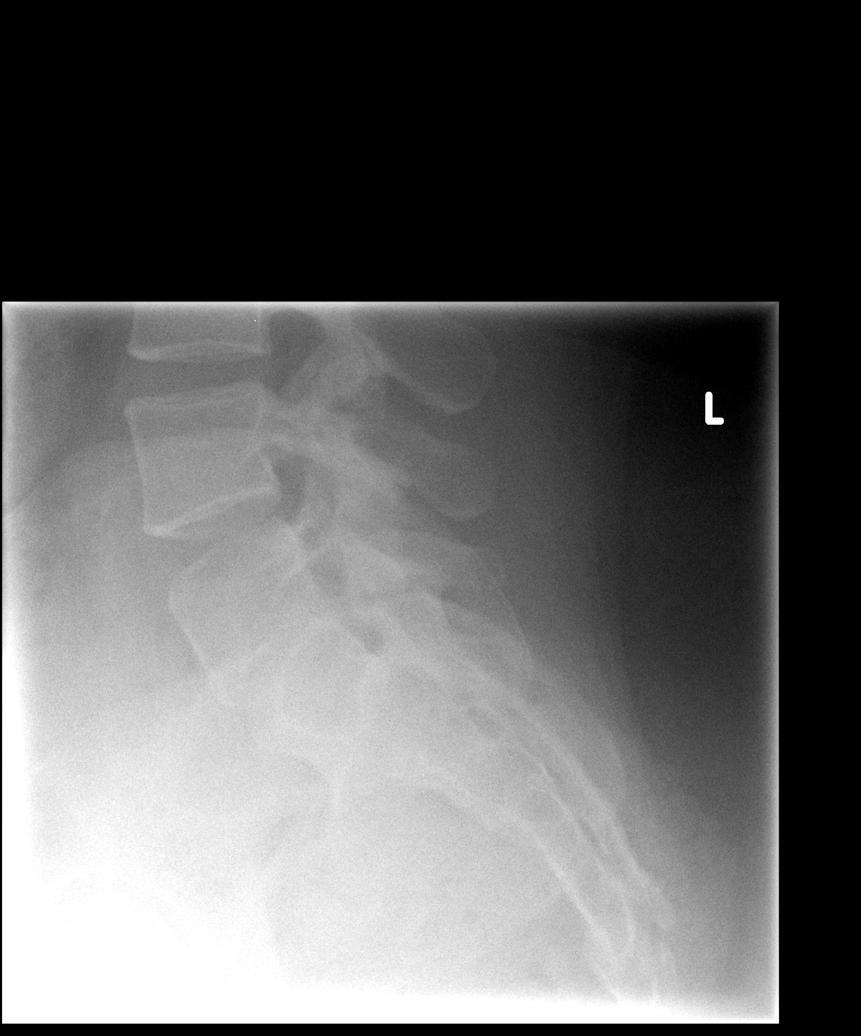

[5 of 5 positions shown; findings below may reference images not displayed]

FINDINGS: Paraspinal soft tissues are normal. No acute bony abnormality
identified. Normal mineralization and alignment.
IMPRESSION: No acute or focal abnormality.

## 2014-04-28 ENCOUNTER — Telehealth: Payer: Self-pay | Admitting: *Deleted

## 2014-04-28 NOTE — Telephone Encounter (Signed)
[facility] Night - Client [facility] Patient Name: [name] Gender: Female DOB: 1980/05/13 Age: 34 Y 77 M 8 D Return Phone Number: [phone] (Primary) Address: [address] City/State/Zip: [place] [address] Client [facility] - Client Client Site [facility] - Night Physician Terri Piedra Contact Type Call Call Type Triage / Clinical Relationship To Patient Self Return Phone Number [phone] (Primary) Chief Complaint Medication reaction Initial Comment Caller states she was taking Paxil and became pregnant and stopped taking Paxil. The caller is feeling dizzy and light headed, because she stopped taking the medication. The caller is not concentrating very well. PreDisposition Did not know what to do Nurse Assessment Nurse: Donovan Kail, RN, Barnetta Chapel Date/Time Eilene Ghazi Time): 04/24/2014 6:31:46 PM Confirm and document reason for call. If symptomatic, describe symptoms. ---Caller states she was taking Paxil and became pregnant and stopped taking Paxil. The caller is feeling dizzy and light headed, because she stopped taking the medication. The caller is not concentrating very well. Paxil- for depression. She is [redacted] weeks pregnant. She feels a little bit stressed- she is having issues. She stopped smoking, caffeine, and quit anti depressants. Has the patient traveled out of the country within the last 30 days? ---Not Applicable Does the patient require triage? ---Yes Related visit to physician within the last 2 weeks? ---No Does the PT have any chronic conditions? (i.e. diabetes, asthma, etc.) ---Yes List chronic conditions. ---Depression, obesity, bulging disks in neck, Did the patient indicate they were pregnant? ---Yes How many weeks gestation? ---5 Have you felt decreased fetal movement? ---Early Pregnancy - No Fetal Movement Felt Yet Guidelines Guideline Title Affirmed  Question Affirmed Notes Nurse Date/Time (Eastern Time) Anxiety and Panic Attack [1] Lightheadedness or dizziness AND [2] persists > 10 minutes AND [3] not relieved by Donovan Kail, RN, Barnetta Chapel 04/24/2014 6:34:18 PM PLEASE NOTE: All timestamps contained within this report are represented as [place] Standard Time. CONFIDENTIALTY NOTICE: This fax transmission is intended only for the addressee. It contains information that is legally privileged, confidential or otherwise protected from use or disclosure. If you are not the intended recipient, you are strictly prohibited from reviewing, disclosing, copying using or disseminating any of this information or taking any action in reliance on or regarding this information. If you have received this fax in error, please notify us immediately by telephone so that we can arrange for its return to [place]. Phone: [phone], Toll-Free: [phone], Fax: [phone] Page: 2 of 2 Call Id: [id number] Guidelines Guideline Title Affirmed Question Affirmed Notes Nurse Date/Time Eilene Ghazi Time) reassurance provided by triager Disp. Time Eilene Ghazi Time) Disposition Final User 04/24/2014 6:45:30 PM Go to ED Now Yes Donovan Kail, RN, Ledell Noss Understands: Yes Disagree/Comply: Comply Care Advice Given Per Guideline GO TO ED NOW: You need to be seen in the Emergency Department. Go to the ER at ___________ [place] now. Drive carefully. DRIVING: Another adult should drive. BRING MEDICINES: * Please bring a list of your current medicines when you go to the Emergency Department (ER). * It is also a good idea to bring the pill bottles too. This will help the doctor to make certain you are taking the right medicines and the right dose. CARE ADVICE given per Anxiety and Panic Attack (Adult) guideline. After Care Instructions Given Call Event Type User Date / Time Description Referrals [facility] - ED

## 2014-05-03 LAB — OB RESULTS CONSOLE ANTIBODY SCREEN: Antibody Screen: NEGATIVE

## 2014-05-03 LAB — OB RESULTS CONSOLE ABO/RH: RH Type: POSITIVE

## 2014-05-03 LAB — OB RESULTS CONSOLE RUBELLA ANTIBODY, IGM: Rubella: IMMUNE

## 2014-05-03 LAB — OB RESULTS CONSOLE HEPATITIS B SURFACE ANTIGEN: Hepatitis B Surface Ag: NEGATIVE

## 2014-05-03 LAB — OB RESULTS CONSOLE GC/CHLAMYDIA
CHLAMYDIA, DNA PROBE: NEGATIVE
GC PROBE AMP, GENITAL: NEGATIVE

## 2014-08-15 ENCOUNTER — Emergency Department (HOSPITAL_COMMUNITY)
Admission: EM | Admit: 2014-08-15 | Discharge: 2014-08-15 | Disposition: A | Discharge status: Home / Self Care | Payer: Federal, State, Local not specified - PPO | Attending: Emergency Medicine | Admitting: Emergency Medicine

## 2014-08-15 ENCOUNTER — Encounter (HOSPITAL_COMMUNITY): Payer: Self-pay

## 2014-08-15 DIAGNOSIS — O99342 Other mental disorders complicating pregnancy, second trimester: Secondary | ICD-10-CM | POA: Insufficient documentation

## 2014-08-15 DIAGNOSIS — Z87891 Personal history of nicotine dependence: Secondary | ICD-10-CM | POA: Insufficient documentation

## 2014-08-15 DIAGNOSIS — O24414 Gestational diabetes mellitus in pregnancy, insulin controlled: Secondary | ICD-10-CM | POA: Diagnosis not present

## 2014-08-15 DIAGNOSIS — Z79899 Other long term (current) drug therapy: Secondary | ICD-10-CM | POA: Diagnosis not present

## 2014-08-15 DIAGNOSIS — F329 Major depressive disorder, single episode, unspecified: Secondary | ICD-10-CM | POA: Diagnosis not present

## 2014-08-15 DIAGNOSIS — Z8719 Personal history of other diseases of the digestive system: Secondary | ICD-10-CM | POA: Insufficient documentation

## 2014-08-15 DIAGNOSIS — Z8639 Personal history of other endocrine, nutritional and metabolic disease: Secondary | ICD-10-CM | POA: Insufficient documentation

## 2014-08-15 DIAGNOSIS — Z3A20 20 weeks gestation of pregnancy: Secondary | ICD-10-CM | POA: Diagnosis not present

## 2014-08-15 DIAGNOSIS — O10012 Pre-existing essential hypertension complicating pregnancy, second trimester: Secondary | ICD-10-CM | POA: Insufficient documentation

## 2014-08-15 DIAGNOSIS — F32A Depression, unspecified: Secondary | ICD-10-CM

## 2014-08-15 LAB — COMPREHENSIVE METABOLIC PANEL
ALBUMIN: 3.3 g/dL — AB (ref 3.5–5.0)
ALT: 18 U/L (ref 14–54)
AST: 15 U/L (ref 15–41)
Alkaline Phosphatase: 69 U/L (ref 38–126)
Anion gap: 7 (ref 5–15)
BUN: 5 mg/dL — ABNORMAL LOW (ref 6–20)
CHLORIDE: 104 mmol/L (ref 101–111)
CO2: 25 mmol/L (ref 22–32)
CREATININE: 0.56 mg/dL (ref 0.44–1.00)
Calcium: 9.3 mg/dL (ref 8.9–10.3)
GFR calc non Af Amer: >60 mL/min (ref >60)
Glucose, Bld: 84 mg/dL (ref 65–99)
POTASSIUM: 3.8 mmol/L (ref 3.5–5.1)
Sodium: 136 mmol/L (ref 135–145)
TOTAL PROTEIN: 6.8 g/dL (ref 6.5–8.1)
Total Bilirubin: 0.4 mg/dL (ref 0.3–1.2)

## 2014-08-15 LAB — CBC
HCT: 36.9 % (ref 36.0–46.0)
Hemoglobin: 12.1 g/dL (ref 12.0–15.0)
MCH: 29.9 pg (ref 26.0–34.0)
MCHC: 32.8 g/dL (ref 30.0–36.0)
MCV: 91.1 fL (ref 78.0–100.0)
PLATELETS: 228 10*3/uL (ref 150–400)
RBC: 4.05 MIL/uL (ref 3.87–5.11)
RDW: 13.9 % (ref 11.5–15.5)
WBC: 11.4 10*3/uL — ABNORMAL HIGH (ref 4.0–10.5)

## 2014-08-15 LAB — I-STAT BETA HCG BLOOD, ED (MC, WL, AP ONLY): I-stat hCG, quantitative: >2000 m[IU]/mL — ABNORMAL HIGH (ref <5)

## 2014-08-15 LAB — ETHANOL

## 2014-08-15 NOTE — Discharge Instructions (Signed)
Depression Depression refers to feeling sad, low, down in the dumps, blue, gloomy, or empty. In general, there are two kinds of depression: 1. Normal sadness or normal grief. This kind of depression is one that we all feel from time to time after upsetting life experiences, such as the loss of a job or the ending of a relationship. This kind of depression is considered normal, is short lived, and resolves within a few days to 2 weeks. Depression experienced after the loss of a loved one (bereavement) often lasts longer than 2 weeks but normally gets better with time. 2. Clinical depression. This kind of depression lasts longer than normal sadness or normal grief or interferes with your ability to function at home, at work, and in school. It also interferes with your personal relationships. It affects almost every aspect of your life. Clinical depression is an illness. Symptoms of depression can also be caused by conditions other than those mentioned above, such as:  Physical illness. Some physical illnesses, including underactive thyroid gland (hypothyroidism), severe anemia, specific types of cancer, diabetes, uncontrolled seizures, heart and lung problems, strokes, and chronic pain are commonly associated with symptoms of depression.  Side effects of some prescription medicine. In some people, certain types of medicine can cause symptoms of depression.  Substance abuse. Abuse of alcohol and illicit drugs can cause symptoms of depression. SYMPTOMS Symptoms of normal sadness and normal grief include the following:  Feeling sad or crying for short periods of time.  Not caring about anything (apathy).  Difficulty sleeping or sleeping too much.  No longer able to enjoy the things you used to enjoy.  Desire to be by oneself all the time (social isolation).  Lack of energy or motivation.  Difficulty concentrating or remembering.  Change in appetite or weight.  Restlessness or  agitation. Symptoms of clinical depression include the same symptoms of normal sadness or normal grief and also the following symptoms:  Feeling sad or crying all the time.  Feelings of guilt or worthlessness.  Feelings of hopelessness or helplessness.  Thoughts of suicide or the desire to harm yourself (suicidal ideation).  Loss of touch with reality (psychotic symptoms). Seeing or hearing things that are not real (hallucinations) or having false beliefs about your life or the people around you (delusions and paranoia). DIAGNOSIS  The diagnosis of clinical depression is usually based on how bad the symptoms are and how long they have lasted. Your health care provider will also ask you questions about your medical history and substance use to find out if physical illness, use of prescription medicine, or substance abuse is causing your depression. Your health care provider may also order blood tests. TREATMENT  Often, normal sadness and normal grief do not require treatment. However, sometimes antidepressant medicine is given for bereavement to ease the depressive symptoms until they resolve. The treatment for clinical depression depends on how bad the symptoms are but often includes antidepressant medicine, counseling with a mental health professional, or both. Your health care provider will help to determine what treatment is best for you. Depression caused by physical illness usually goes away with appropriate medical treatment of the illness. If prescription medicine is causing depression, talk with your health care provider about stopping the medicine, decreasing the dose, or changing to another medicine. Depression caused by the abuse of alcohol or illicit drugs goes away when you stop using these substances. Some adults need professional help in order to stop drinking or using drugs. [place]  CARE IF:  You have thoughts about hurting yourself or others.  You lose touch  with reality (have psychotic symptoms).  You are taking medicine for depression and have a serious side effect. FOR MORE INFORMATION  National Alliance on Mental Illness: www.nami.[employer] of Mental Health: https://carter.com/ Document Released: 12/23/1999 Document Revised: 05/11/2013 Document Reviewed: 03/26/2011 [facility] Patient Information 2015 [place], [place]. This information is not intended to replace advice given to you by your health care provider. Make sure you discuss any questions you have with your health care provider.  RESOURCE GUIDE  Chronic Pain Problems: Contact [facility]  [phone] Patients need to be referred by their primary care doctor.  Insufficient Money for Medicine: Contact United Way:  call "211" or [place] [phone].  No Primary Care Doctor: Call [facility] Connect  [phone] - can help you locate a primary care doctor that  accepts your insurance, provides certain services, etc. Physician Referral Service- [phone]  Agencies that provide inexpensive medical care: [facility]  [facility]  [phone] [facility]  [phone] [facility]  [phone] [facility]  [phone] [facility]  [phone]  [place] Providers: [facility]- [address]  [phone], Mon-Fri 9am-7pm, Sat 9am-1pm [place], [address], [address] [place]  [place]- [address]  [place], [address], [phone]  Only accepts [place] patients after they have their name  applied to their card  Self Pay (no insurance) in [facility]: Sickle Cell Patients: Dr Kevan Ny, [facility]  [place],  [facility]- [place]  [place] [facility] [place]- [address], [facility]- see information above (Speak to [employer] if you do not have insurance)       -  [facility]- [place], [place],  [place], [place]  Dr Vista Lawman-  [address], [place], [facility]- [address] [phone]       -  [facility]- [address], [place], also [address], [phone]       -    [facility]- [address], [place], 1st & 3rd Saturday   every month, 10am-1pm  1) Find a Doctor and Pay Out of Pocket Although you won't have to find out who is covered by your insurance plan, it is a good idea to ask around and get recommendations. You will then need to call the office and see if the doctor you have chosen will accept you as a new patient and what types of options they offer for patients who are self-pay. Some doctors offer discounts or will set up payment plans for their patients who do not have insurance, but you will need to ask  so you aren't surprised when you get to your appointment.  2) Contact Your Local Health Department Not all health departments have doctors that can see patients for sick visits, but many do, so it is worth a call to see if yours does. If you don't know where your local health department is, you can check in your phone book. The CDC also has a tool to help you locate your state's health department, and many state websites also have listings of all of their local health departments.  3) Find a [facility] If your illness is not likely to be very severe or complicated, you may want to try a walk in clinic. These are popping up all over the country in pharmacies,  drugstores, and shopping centers. They're usually staffed by nurse practitioners or physician assistants that have been trained to treat common illnesses and complaints. They're usually fairly quick and inexpensive. However, if you have serious medical issues or chronic medical problems, these are probably not your best option  [facility] [place], [facility], [address] [place], phone [phone] or [phone].  Monday - Friday, call for an appointment. [place], [facility], [address], [place], phone [phone] or [phone].  Monday - Friday, call for an appointment.  Abuse/Neglect: [place] [phone] [place] [phone] (After Hours)  Emergency Shelter:  [facility] [phone]  Maternity Homes: Room at the [place] [phone] [place] [phone]  MRSA Hotline #:   403-329-0060  [facility] [place] Dept. [address]         [place] Phone:  [phone]                                  Phone:  [phone]                   Phone:  [phone]  [facility], [place]- [phone]       -     [facility] in [place], [address],                                  [place] [phone] or [phone] (After Hours)   [place]  Substance Abuse Resources: Alcohol and Drug Services  [place] [phone] [facility] [place] Chinita Pester [phone] Residential & Outpatient Substance Abuse Program  [phone]  Psychological Services: [place]  [place]  [place] [address], [place], [place]: [phone] or [phone], Pic[employer]uy  Dental Assistance  If unable to pay or uninsured, contact:  [facility] or [facility]. to become qualified for the adult dental clinic.  Patients with Medicaid: [facility] [phone] [address], [place] [address], [phone]  If unable to pay, or uninsured, contact [facility] [phone]) or [place] [phone] in [place], [place] in [facility]) to become qualified for the adult dental clinic  Other [place]- [place], [place], [place], [address], [place], [place], 2nd and 4th Thursday of the month at 6:30am.  10 clients each day by appointment, can sometimes see walk-in patients if someone does not show for an appointment. [facility]- [address] Hillard Danker [place], [place], [address], [place], [place], [place], [address], [place] [facility] [phone]  Please make every effort to establish with a primary care physician for routine medical care  [place]  [facility] [place] provides a wide range of adult health services. Some of these services are designed to address the healthcare needs of all [facility] residents and all services are designed to meet the needs of uninsured/underinsured low income residents. Some services are available to any resident of [place], call [phone] for details. ] The  [facility], a new medical clinic for adults, is now open. For more information about the Center and its services please call [phone]. For information on our New Home services, click here.  For more information on any of the following Department of Public Health programs, including hours of service, click on the highlighted link.  SERVICES FOR WOMEN (Adults and Teens) [employer] provide a full range of birth control options plus education and counseling. New patient visit and annual return visits include a complete examination, pap test as indicated, and other laboratory as indicated. Included is our [employer] for men.  Maternity Care is provided through pregnancy, including a six week post partum exam. Women who meet eligibility criteria for the Medicaid for Pregnant Women program, receive care free. Other women are charged on a sliding scale according to income. Note: [facility] provides services to pregnant women who have a Medicaid card. Call [phone] for an appointment in [place] or [phone] for an appointment in [facility].  Primary Care for Medicaid [place] Access Women is available through the [place]. As primary care provider for the [place] program, women may designate the [facility] clinic as their primary care provider.  PLEASE CALL [phone] FOR AN APPOINTMENT FOR THE ABOVE SERVICES IN EITHER [place] OR [place]. Information available in [place] and [place].   Childbirth Education Classes are open to the public and offered to help families prepare for the best possible childbirth experience as well as to promote lifelong health and wellness. Classes are offered throughout the year and meet on the same night once a week for five weeks. Medicaid covers the cost of the classes for the mother-to-be and her partner. For  participants without Medicaid, the cost of the class series is $45.00 for the mother-to-be and her partner. Class size is limited and registration is required. For more information or to register  call [phone]. Baby items donated by [employer] and the [employer] [place] are given away during each class series. ° °SERVICES FOR WOMEN AND MEN °Sexually Transmitted Infection appointments, including HIV testing, are available daily (weekdays, except holidays). Call early as same-day appointments are limited. For an appointment in either [place] or [place], call [phone]. Services are confidential and free of charge. ° °Skin Testing for Tuberculosis Please call [phone]. °Adult Immunizations are available, usually for a fee. Please call [phone] for details. ° °PLEASE CALL [phone] FOR AN APPOINTMENT FOR THE ABOVE SERVICES IN EITHER [place] OR [place].  ° °[facility] provides up to the minute recommended vaccines for your travel destination. We also provide essential health and political information to help insure a safe and pleasurable travel experience. This program is self-sustaining, however, fees are very competitive. We are a CERTIFIED YELLOW FEVER IMMUNIZATION approved clinic site. °PLEASE CALL [phone] FOR AN APPOINTMENT IN EITHER [place] OR [place].  ° °If you have questions about the services listed above, we want to answer them! Email us at: [email or web address] °Home Visiting Services for elderly and the disabled are available to residents of [place] who are in need of care that compares to the care offered by a nursing home, have needs that can be met by the program, and have [place] Medicaid. Other short term services are available to residents 18 years and older who are unable to meet requirements for eligibility to receive services from a certified home health agency, spend the majority of time at home, and need care for six months or  less. ° °PLEASE CALL [phone] OR [phone] FOR MORE INFORMATION. °Medication Assistance Program serves as a link between pharmaceutical companies and patients to provide low cost or free prescription medications. This servce is available for residents who meet certain income restrictions and have no insurance coverage. ° °PLEASE CALL [phone] ([place]) OR [phone] ([place]) FOR MORE INFORMATION.  °Updated Feb. 21, 2013 ° ° °

## 2014-08-15 NOTE — ED Provider Notes (Signed)
CSN: [id number]     Arrival date & time 08/15/14  1718 History   First MD Initiated Contact with Patient 08/15/14 1728     Chief Complaint  Patient presents with  . Depression  . Suicidal     (Consider location/radiation/quality/duration/timing/severity/associated sxs/prior Treatment) HPI    PCP: Gwendolyn Grant, MD Blood pressure 157/85, pulse 99, temperature 98.2 F (36.8 C), temperature source Oral, resp. rate 20, last menstrual period 01/26/2014, SpO2 99 %.  [name] is a 34 y.o.female with a significant PMH of depression, cluster headaches, GERD, hyperlipidemia, snoring, gestational diabetes presents to the ER with complaints of depression and SI. She has a long hx of the same but since becoming pregnant (20 weeks) she is unable to take her antidepressant medication and the new medicine for depression is not working.  She is also stressed because her mom passed recently. She has been withdrawing from family members and fighting more than normal. Today she got into a fight with her significant other and said things that she felt she should not have said and took her packet of Paxil and considered taking them all but then thought about her kids and her family. She is not currently suicidal anymore but feels as though she is having difficulty coping. She feels that her medication is not work and needs an adjustment. The patients husband is present and supportive. The patient doe snot want inpatient and prefers to go home this evening.  No current SI/HI, hallucinations/delusions. No ETOH or drug abuse.   Past Medical History  Diagnosis Date  . Depression   . Cluster headaches   . GERD (gastroesophageal reflux disease)   . Hyperlipidemia   . Hypertension   . Snoring     sleep study 02/2012 with min AHI events  . Gestational diabetes 2012   Past Surgical History  Procedure Laterality Date  . Ganglion removal from (l) wrist  1998  . Cesarean section  8 & 11    x's 2  .  Wisdom tooth extraction     Family History  Problem Relation Age of Onset  . Arthritis Mother   . Alcohol abuse Mother   . Mental illness Mother   . Cancer Mother 35    squamous - unknown primary  . Coronary artery disease Mother 32    MI with stent  . Arthritis Father   . Alcohol abuse Father   . Hyperlipidemia Father   . Heart disease Father   . Kidney disease Father   . Stroke Father   . Mental illness Father   . Diabetes Maternal Grandmother   . Heart disease Maternal Grandmother   . Stroke Maternal Grandfather   . Hypertension Paternal Grandfather   . Hyperlipidemia Paternal Grandfather   . Arthritis Other   . Alcohol abuse Other   . Breast cancer Paternal Aunt    History  Substance Use Topics  . Smoking status: Former Smoker -- 0.33 packs/day for 0 years  . Smokeless tobacco: Never Used  . Alcohol Use: No   OB History    Gravida Para Term Preterm AB TAB SAB Ectopic Multiple Living   3 2        2      Review of Systems  10 Systems reviewed and are negative for acute change except as noted in the HPI.   Allergies  Codeine and Doxycycline  Home Medications   Prior to Admission medications   Medication Sig Start Date End Date Taking? Authorizing Provider  acetaminophen (TYLENOL) 325 MG tablet Take 650 mg by mouth every 6 (six) hours as needed for moderate pain.   Yes Historical Provider, MD  [employer]S D3 2000 UNITS CAPS Take 2 capsules by mouth daily. 07/01/14  Yes Historical Provider, MD  FLUoxetine (PROZAC) 20 MG tablet TAKE 1 TABLET(S) BY MOUTH EVERY DAY FOR 30 DAYS 07/25/14  Yes Historical Provider, MD  Prenatal Vit-Fe Fumarate-FA (PNV PRENATAL PLUS MULTIVITAMIN) 27-1 MG TABS Take 1 tablet by mouth daily. 07/26/14  Yes Historical Provider, MD  cyclobenzaprine (FLEXERIL) 10 MG tablet Take 1 tablet (10 mg total) by mouth 3 (three) times daily as needed. Patient not taking: Reported on 08/15/2014 01/07/14   Golden Circle, FNP  diazepam (VALIUM) 5 MG tablet Take 1  tablet (5 mg total) by mouth every 6 (six) hours as needed for anxiety (spasms). Patient not taking: Reported on 08/15/2014 01/07/14   Golden Circle, FNP  meloxicam (MOBIC) 15 MG tablet Take 1 tablet (15 mg total) by mouth as needed. Patient not taking: Reported on 08/15/2014 01/07/14 12/05/15  Golden Circle, FNP  PARoxetine (PAXIL) 20 MG tablet Take 1 tablet (20 mg total) by mouth daily. Patient not taking: Reported on 08/15/2014 12/10/13   Rowe Clack, MD   BP 157/85 mmHg  Pulse 99  Temp(Src) 98.2 F (36.8 C) (Oral)  Resp 20  SpO2 99%  LMP 01/26/2014 Physical Exam  Constitutional: She appears well-developed and well-nourished. No distress.  Pt is tearful  HENT:  Head: Normocephalic and atraumatic.  Eyes: Pupils are equal, round, and reactive to light.  Neck: Normal range of motion. Neck supple.  Cardiovascular: Normal rate and regular rhythm.   Pulmonary/Chest: Effort normal.  Abdominal: Soft.  Neurological: She is alert.  Skin: Skin is warm and dry.  Psychiatric: Her speech is normal and behavior is normal. She exhibits a depressed mood. She expresses no homicidal and no suicidal ideation. She expresses no suicidal plans and no homicidal plans.  Nursing note and vitals reviewed.   ED Course  Procedures (including critical care time) Labs Review Labs Reviewed  COMPREHENSIVE METABOLIC PANEL - Abnormal; Notable for the following:    BUN 5 (*)    Albumin 3.3 (*)    All other components within normal limits  CBC - Abnormal; Notable for the following:    WBC 11.4 (*)    All other components within normal limits  I-STAT BETA HCG BLOOD, ED (MC, WL, AP ONLY) - Abnormal; Notable for the following:    I-stat hCG, quantitative >2000.0 (*)    All other components within normal limits  ETHANOL  URINE RAPID DRUG SCREEN, HOSP PERFORMED    Imaging Review No results found.   EKG Interpretation None      MDM   Final diagnoses:  Depression    The patients labs are  unremarkable. She had suicidal thoughts earlier today but is not currently suicidal. Her husband is here with her. She wants to go home and is able to contract for safety. We discussed how we can not alter her medications from the ER  And she has been given resources from TTS personal. The patient is comfortable with the plan and will come back as needed if she does not feel like she can wait to get in for an appointment.Just prior to discharge, pt denies SI/HI.  Medications - No data to display  34 y.o.[name]'s evaluation in the Emergency Department is complete. It has been determined that no acute conditions  requiring further emergency intervention are present at this time. The patient/guardian have been advised of the diagnosis and plan. We have discussed signs and symptoms that warrant return to the ED, such as changes or worsening in symptoms.  Vital signs are stable at discharge. Filed Vitals:   08/15/14 1736  BP: 157/85  Pulse: 99  Temp: 98.2 F (36.8 C)  Resp: 20    Patient/guardian has voiced understanding and agreed to follow-up with the PCP or specialist.     Delos Haring, PA-C 08/15/14 1921  Tanna Furry, MD 08/23/14 [phone]

## 2014-08-15 NOTE — ED Notes (Signed)
Unable to collect labs at this time PA in room talking with patient.

## 2014-08-15 NOTE — ED Notes (Signed)
Pt c/o increasing depression since beginning of pregnancy x 21 weeks ago and SI w/ plan to OD starting today.  Pt reports several medication changes during pregnancy.  Pt sts "I've been very irritable and picking fights.  I started a fight w/ my husband today and said things in front of my kids that I never should have said.  Then, I started to feel worthless."  Pt reports that she was "holding a bottle of pills," but did not take them due to "thinking about the baby."  Denies medical complaints.

## 2014-09-12 ENCOUNTER — Encounter (HOSPITAL_COMMUNITY): Payer: Self-pay

## 2014-09-12 ENCOUNTER — Emergency Department (HOSPITAL_COMMUNITY): Payer: Federal, State, Local not specified - PPO

## 2014-09-12 ENCOUNTER — Emergency Department (HOSPITAL_COMMUNITY)
Admission: EM | Admit: 2014-09-12 | Discharge: 2014-09-13 | Disposition: A | Discharge status: Home / Self Care | Payer: Federal, State, Local not specified - PPO | Attending: Emergency Medicine | Admitting: Emergency Medicine

## 2014-09-12 DIAGNOSIS — Z8632 Personal history of gestational diabetes: Secondary | ICD-10-CM | POA: Diagnosis not present

## 2014-09-12 DIAGNOSIS — J069 Acute upper respiratory infection, unspecified: Secondary | ICD-10-CM

## 2014-09-12 DIAGNOSIS — Z79899 Other long term (current) drug therapy: Secondary | ICD-10-CM | POA: Diagnosis not present

## 2014-09-12 DIAGNOSIS — Z8639 Personal history of other endocrine, nutritional and metabolic disease: Secondary | ICD-10-CM | POA: Diagnosis not present

## 2014-09-12 DIAGNOSIS — Z87891 Personal history of nicotine dependence: Secondary | ICD-10-CM | POA: Diagnosis not present

## 2014-09-12 DIAGNOSIS — F329 Major depressive disorder, single episode, unspecified: Secondary | ICD-10-CM | POA: Diagnosis not present

## 2014-09-12 DIAGNOSIS — R0981 Nasal congestion: Secondary | ICD-10-CM | POA: Diagnosis present

## 2014-09-12 DIAGNOSIS — I10 Essential (primary) hypertension: Secondary | ICD-10-CM | POA: Insufficient documentation

## 2014-09-12 DIAGNOSIS — Z8719 Personal history of other diseases of the digestive system: Secondary | ICD-10-CM | POA: Diagnosis not present

## 2014-09-12 LAB — CBC WITH DIFFERENTIAL/PLATELET
Basophils Absolute: 0 10*3/uL (ref 0.0–0.1)
Basophils Relative: 0 % (ref 0–1)
EOS ABS: 0 10*3/uL (ref 0.0–0.7)
EOS PCT: 0 % (ref 0–5)
HCT: 36.1 % (ref 36.0–46.0)
Hemoglobin: 11.7 g/dL — ABNORMAL LOW (ref 12.0–15.0)
LYMPHS ABS: 0.9 10*3/uL (ref 0.7–4.0)
Lymphocytes Relative: 11 % — ABNORMAL LOW (ref 12–46)
MCH: 29.7 pg (ref 26.0–34.0)
MCHC: 32.4 g/dL (ref 30.0–36.0)
MCV: 91.6 fL (ref 78.0–100.0)
MONOS PCT: 9 % (ref 3–12)
Monocytes Absolute: 0.7 10*3/uL (ref 0.1–1.0)
Neutro Abs: 6.7 10*3/uL (ref 1.7–7.7)
Neutrophils Relative %: 80 % — ABNORMAL HIGH (ref 43–77)
PLATELETS: 195 10*3/uL (ref 150–400)
RBC: 3.94 MIL/uL (ref 3.87–5.11)
RDW: 14 % (ref 11.5–15.5)
WBC: 8.4 10*3/uL (ref 4.0–10.5)

## 2014-09-12 IMAGING — CR DG CHEST 2V
2 series · 2 of 2 positions shown · non-contrast
Comparison: None.

CLINICAL DATA: 33-year-old female with left upper chest pain

EXAM:
CHEST  2 VIEW

[w chest pa]
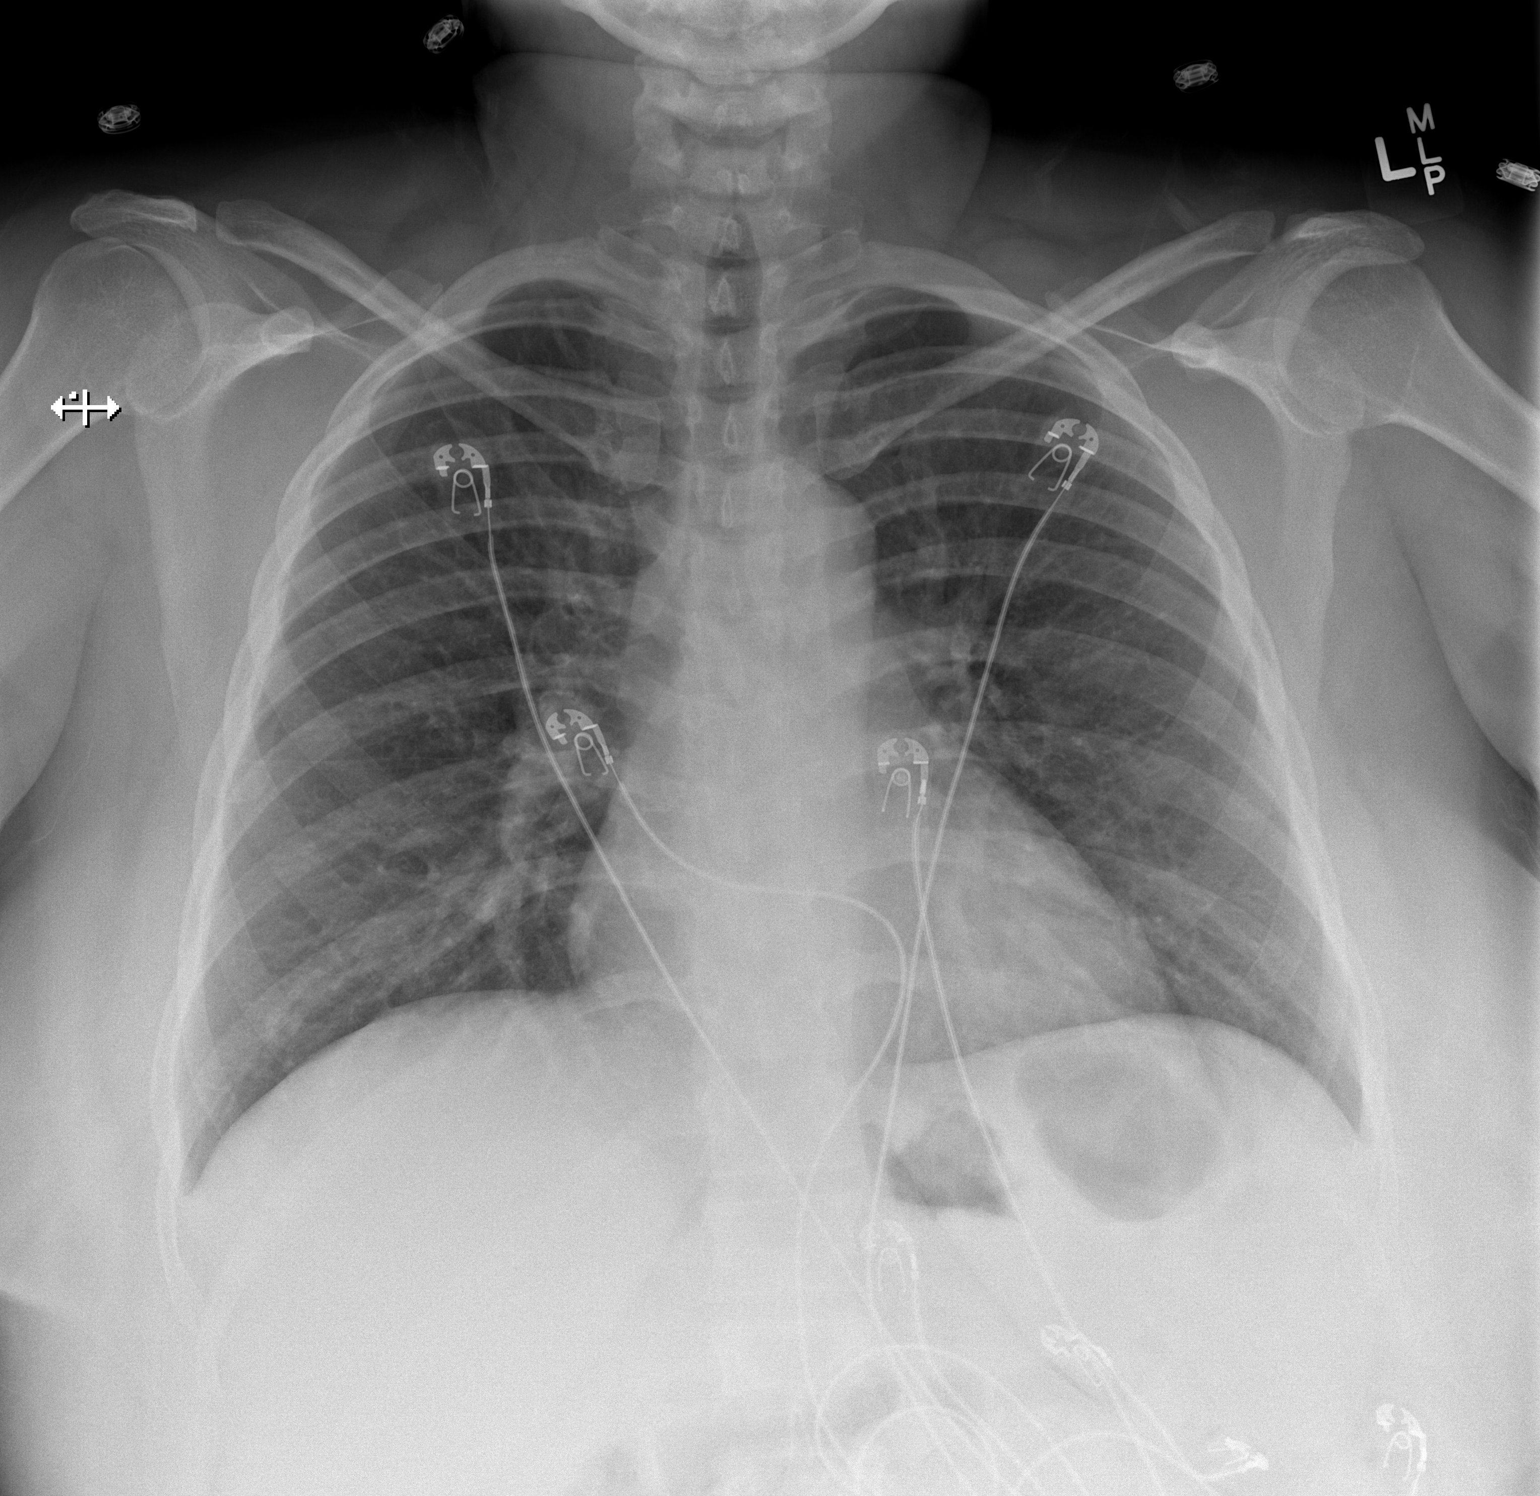

[w chest lat]
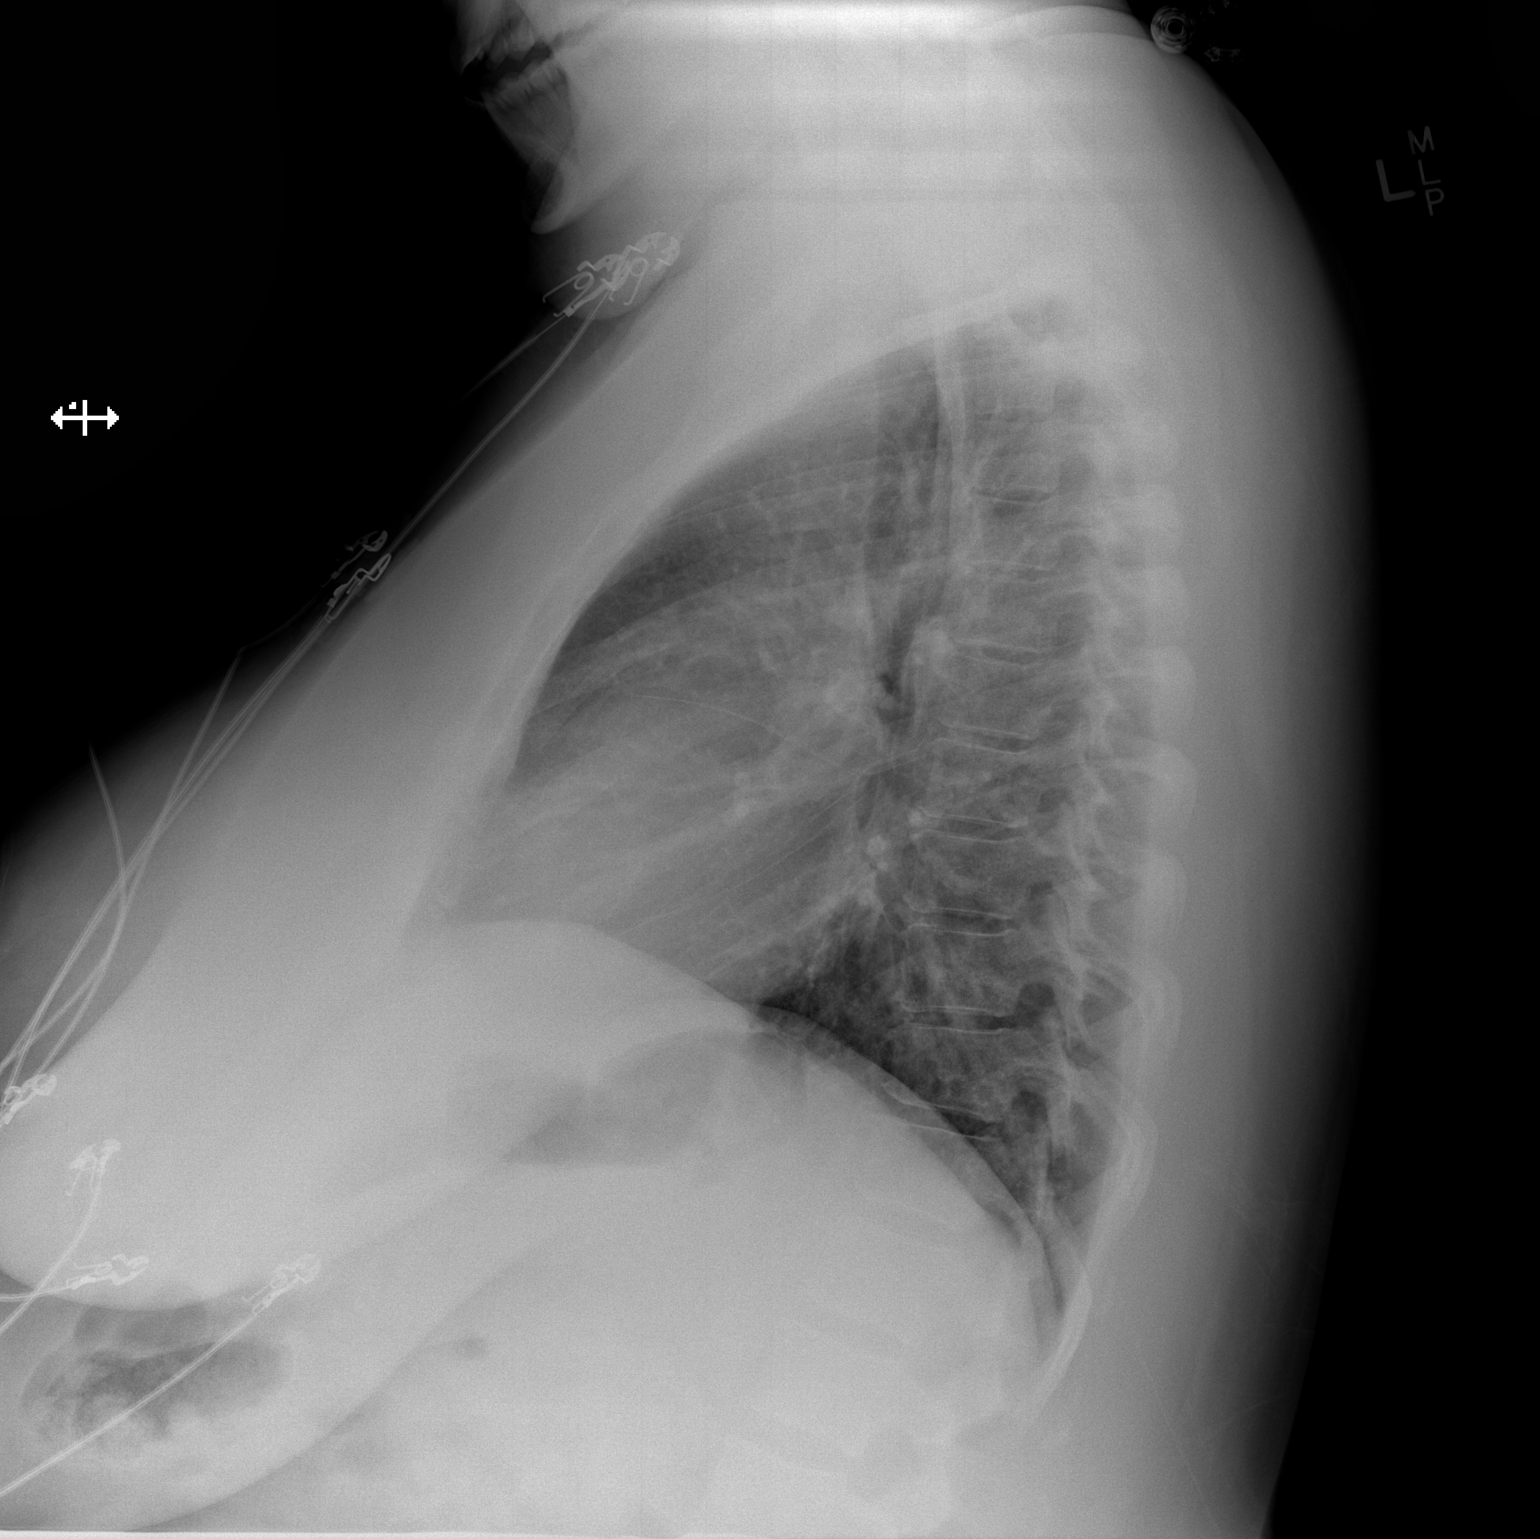

[2 of 2 positions shown; findings below may reference images not displayed]

FINDINGS: The heart size and mediastinal contours are within normal limits.
Both lungs are clear. The visualized skeletal structures are
unremarkable.
IMPRESSION: No active cardiopulmonary disease.

## 2014-09-12 MED ORDER — ACETAMINOPHEN 325 MG PO TABS
650.0000 mg | ORAL_TABLET | Freq: Once | ORAL | Status: AC
  Administered 2014-09-12: 650 mg via ORAL
  Filled 2014-09-12: qty 2

## 2014-09-12 NOTE — ED Notes (Signed)
Pt reports taking tylenol at 13:00.

## 2014-09-12 NOTE — ED Provider Notes (Signed)
CSN: [id number]     Arrival date & time 09/12/14  2135 History   First MD Initiated Contact with Patient 09/12/14 2143     Chief Complaint  Patient presents with  . Chest wall pain   . chest congestion   . Nasal Congestion  . Chills  . Emesis   HPI  [name] is a 34 year old [redacted] week pregnant female presenting with fevers, congestion, chest wall pain and nausea. Pt states she began feeling congested and feverish yesterday. She took tylenol and was able to bring the fever down. She reports worsening symptoms today and left sided chest pain. The pain is described as sharp and increases with inspiration and movement. She is also experiencing nausea with one episode of vomiting this morning. Endorses postnasal drip and sore throat. Endorses back pain and GERD sx due to her pregnancy. Denies cough, SOB, palpitations, abdominal pain or diarrhea.   Past Medical History  Diagnosis Date  . Depression   . Cluster headaches   . GERD (gastroesophageal reflux disease)   . Hyperlipidemia   . Hypertension   . Snoring     sleep study 02/2012 with min AHI events  . Gestational diabetes 2012  . Pregnant and not yet delivered in second trimester    Past Surgical History  Procedure Laterality Date  . Ganglion removal from (l) wrist  1998  . Cesarean section  8 & 11    x's 2  . Wisdom tooth extraction     Family History  Problem Relation Age of Onset  . Arthritis Mother   . Alcohol abuse Mother   . Mental illness Mother   . Cancer Mother 67    squamous - unknown primary  . Coronary artery disease Mother 53    MI with stent  . Arthritis Father   . Alcohol abuse Father   . Hyperlipidemia Father   . Heart disease Father   . Kidney disease Father   . Stroke Father   . Mental illness Father   . Diabetes Maternal Grandmother   . Heart disease Maternal Grandmother   . Stroke Maternal Grandfather   . Hypertension Paternal Grandfather   . Hyperlipidemia Paternal Grandfather   . Arthritis  Other   . Alcohol abuse Other   . Breast cancer Paternal Aunt    Social History  Substance Use Topics  . Smoking status: Former Smoker -- 0.33 packs/day for 0 years  . Smokeless tobacco: Never Used  . Alcohol Use: No   OB History    Gravida Para Term Preterm AB TAB SAB Ectopic Multiple Living   3 2        2      Review of Systems  Constitutional: Positive for fever, chills and fatigue.  HENT: Positive for congestion, postnasal drip, rhinorrhea, sinus pressure and sore throat. Negative for ear pain.   Respiratory: Negative for cough and shortness of breath.   Cardiovascular: Positive for chest pain.  Gastrointestinal: Positive for nausea and vomiting. Negative for abdominal pain and diarrhea.  Genitourinary: Negative for dysuria.  Musculoskeletal: Positive for back pain.  Neurological: Negative for headaches.      Allergies  Codeine and Doxycycline  Home Medications   Prior to Admission medications   Medication Sig Start Date End Date Taking? Authorizing Provider  acetaminophen (TYLENOL) 325 MG tablet Take 650 mg by mouth every 6 (six) hours as needed for moderate pain.   Yes Historical Provider, MD  [employer] D3 2000 UNITS CAPS Take 2  capsules by mouth daily. 07/01/14  Yes Historical Provider, MD  FLUoxetine (PROZAC) 20 MG tablet TAKE 1 TABLET(S) BY MOUTH EVERY DAY FOR 30 DAYS 07/25/14  Yes Historical Provider, MD  Prenatal Vit-Fe Fumarate-FA (PNV PRENATAL PLUS MULTIVITAMIN) 27-1 MG TABS Take 1 tablet by mouth daily. 07/26/14  Yes Historical Provider, MD  cyclobenzaprine (FLEXERIL) 10 MG tablet Take 1 tablet (10 mg total) by mouth 3 (three) times daily as needed. Patient not taking: Reported on 08/15/2014 01/07/14   Golden Circle, FNP  diazepam (VALIUM) 5 MG tablet Take 1 tablet (5 mg total) by mouth every 6 (six) hours as needed for anxiety (spasms). Patient not taking: Reported on 08/15/2014 01/07/14   Golden Circle, FNP  meloxicam (MOBIC) 15 MG tablet Take 1 tablet (15 mg total)  by mouth as needed. Patient not taking: Reported on 08/15/2014 01/07/14 12/05/15  Golden Circle, FNP  PARoxetine (PAXIL) 20 MG tablet Take 1 tablet (20 mg total) by mouth daily. Patient not taking: Reported on 08/15/2014 12/10/13   Rowe Clack, MD   BP 129/57 mmHg  Pulse 93  Temp(Src) 99.8 F (37.7 C) (Oral)  Resp 23  SpO2 97%  LMP 01/26/2014 Physical Exam  Constitutional: She is oriented to person, place, and time. She appears well-developed and well-nourished. No distress.  HENT:  Head: Normocephalic and atraumatic.  Nose: Rhinorrhea present. Right sinus exhibits maxillary sinus tenderness. Left sinus exhibits maxillary sinus tenderness.  Mouth/Throat: Mucous membranes are normal. Posterior oropharyngeal erythema present. No oropharyngeal exudate or posterior oropharyngeal edema.  Eyes: Conjunctivae and EOM are normal.  Neck: Normal range of motion.  Cardiovascular: Normal rate, regular rhythm and normal heart sounds.   Pulmonary/Chest: Effort normal and breath sounds normal. No respiratory distress. She has no wheezes. She exhibits tenderness.  Pt tender to deep palpation of left sided anterior chest wall  Abdominal: Soft. She exhibits distension. There is no tenderness. There is no rebound and no guarding.  Musculoskeletal: Normal range of motion.  Lymphadenopathy:    She has no cervical adenopathy.  Neurological: She is alert and oriented to person, place, and time.  Skin: Skin is warm and dry.  Psychiatric: She has a normal mood and affect. Her behavior is normal.  Nursing note and vitals reviewed.   ED Course  Procedures (including critical care time) Labs Review Labs Reviewed  CBC WITH DIFFERENTIAL/PLATELET - Abnormal; Notable for the following:    Hemoglobin 11.7 (*)    Neutrophils Relative % 80 (*)    Lymphocytes Relative 11 (*)    All other components within normal limits  BASIC METABOLIC PANEL - Abnormal; Notable for the following:    Sodium 133 (*)    CO2  19 (*)    Calcium 8.7 (*)    All other components within normal limits    Imaging Review Dg Chest 2 View  09/13/2014   CLINICAL DATA:  34 year old female with left upper chest pain  EXAM: CHEST  2 VIEW  COMPARISON:  None.  FINDINGS: The heart size and mediastinal contours are within normal limits. Both lungs are clear. The visualized skeletal structures are unremarkable.  IMPRESSION: No active cardiopulmonary disease.   Electronically Signed   By: Anner Crete M.D.   On: 09/13/2014 01:19   I have personally reviewed and evaluated these images and lab results as part of my medical decision-making.   EKG Interpretation   Date/Time:  Sunday September 12 2014 21:42:36 EDT Ventricular Rate:  103 PR Interval:  142  QRS Duration: 72 QT Interval:  323 QTC Calculation: 423 R Axis:   6 Text Interpretation:  Sinus tachycardia Atrial premature complex Confirmed  by Christy Gentles  MD, DONALD ([id number]) on 09/13/2014 1:06:29 AM      MDM   Final diagnoses:  URI (upper respiratory infection)   Pt presenting with fevers, fatigue, nasal congestion, sore throat, nausea and chest wall pain. Symptoms x 1 day. Pt is able to bring fever down with tylenol at home. Chest wall pain is increased with inspiration and movement. Denies SOB, palpitations or chest tightness. One episode of vomiting this morning. Pt febrile on presentation to ED, given tylenol and brought down. Pt nontoxic appearing. Sinus tenderness present. Rhinorrhea. Erythematous oropharynx, no exudate or tonsilar abscess. Lungs CTAB. No abdominal tenderness. Pt likely with viral URI or sinusitis. Pt signed out to Charlann Lange, PA-C at shift change. If CXR negative, symptoms x 1 day so no antibiotics indicated and pt will be discharge with symptomatic care and instructions to follow up with PCP in 1 week if symptoms persist. If CXR, positive, will treat as indicated.    Josephina Gip, PA-C 09/13/14 1117  Leo Grosser, MD 09/15/14 1027

## 2014-09-12 NOTE — ED Notes (Signed)
Fetal Heart Tones assessed with doppler. Fetal HR to be 130s and was found on the left upper quadrant.  Pt reports baby has been moving like normal.

## 2014-09-12 NOTE — ED Notes (Signed)
Patient arrives by EMS with complaints of nasal and chest congestion, chest wall pain, chills, vomited x 1-started at 0900 this am.  Patient is [redacted] weeks pregnant

## 2014-09-12 NOTE — ED Notes (Signed)
Bed: XN17 Expected date:  Expected time:  Means of arrival:  Comments: EMS [redacted] weeks pregnant/chest and nasal congestion

## 2014-09-12 NOTE — ED Notes (Signed)
Spoke with [facility], states will come and do fetal monitoring if requested by the physician-will come in if needed

## 2014-09-13 LAB — BASIC METABOLIC PANEL
ANION GAP: 10 (ref 5–15)
BUN: 6 mg/dL (ref 6–20)
CALCIUM: 8.7 mg/dL — AB (ref 8.9–10.3)
CO2: 19 mmol/L — ABNORMAL LOW (ref 22–32)
CREATININE: 0.63 mg/dL (ref 0.44–1.00)
Chloride: 104 mmol/L (ref 101–111)
GFR calc Af Amer: >60 mL/min (ref >60)
GLUCOSE: 90 mg/dL (ref 65–99)
Potassium: 3.6 mmol/L (ref 3.5–5.1)
Sodium: 133 mmol/L — ABNORMAL LOW (ref 135–145)

## 2014-09-13 NOTE — Discharge Instructions (Signed)
Upper Respiratory Infection, Adult An upper respiratory infection (URI) is also sometimes known as the common cold. The upper respiratory tract includes the nose, sinuses, throat, trachea, and bronchi. Bronchi are the airways leading to the lungs. Most people improve within 1 week, but symptoms can last up to 2 weeks. A residual cough may last even longer.  CAUSES Many different viruses can infect the tissues lining the upper respiratory tract. The tissues become irritated and inflamed and often become very moist. Mucus production is also common. A cold is contagious. You can easily spread the virus to others by oral contact. This includes kissing, sharing a glass, coughing, or sneezing. Touching your mouth or nose and then touching a surface, which is then touched by another person, can also spread the virus. SYMPTOMS  Symptoms typically develop 1 to 3 days after you come in contact with a cold virus. Symptoms vary from person to person. They may include:  Runny nose.  Sneezing.  Nasal congestion.  Sinus irritation.  Sore throat.  Loss of voice (laryngitis).  Cough.  Fatigue.  Muscle aches.  Loss of appetite.  Headache.  Low-grade fever. DIAGNOSIS  You might diagnose your own cold based on familiar symptoms, since most people get a cold 2 to 3 times a year. Your caregiver can confirm this based on your exam. Most importantly, your caregiver can check that your symptoms are not due to another disease such as strep throat, sinusitis, pneumonia, asthma, or epiglottitis. Blood tests, throat tests, and X-rays are not necessary to diagnose a common cold, but they may sometimes be helpful in excluding other more serious diseases. Your caregiver will decide if any further tests are required. RISKS AND COMPLICATIONS  You may be at risk for a more severe case of the common cold if you smoke cigarettes, have chronic heart disease (such as heart failure) or lung disease (such as asthma), or if  you have a weakened immune system. The very young and very old are also at risk for more serious infections. Bacterial sinusitis, middle ear infections, and bacterial pneumonia can complicate the common cold. The common cold can worsen asthma and chronic obstructive pulmonary disease (COPD). Sometimes, these complications can require emergency medical care and may be life-threatening. PREVENTION  The best way to protect against getting a cold is to practice good hygiene. Avoid oral or hand contact with people with cold symptoms. Wash your hands often if contact occurs. There is no clear evidence that vitamin C, vitamin E, echinacea, or exercise reduces the chance of developing a cold. However, it is always recommended to get plenty of rest and practice good nutrition. TREATMENT  Treatment is directed at relieving symptoms. There is no cure. Antibiotics are not effective, because the infection is caused by a virus, not by bacteria. Treatment may include:  Increased fluid intake. Sports drinks offer valuable electrolytes, sugars, and fluids.  Breathing heated mist or steam (vaporizer or shower).  Eating chicken soup or other clear broths, and maintaining good nutrition.  Getting plenty of rest.  Using gargles or lozenges for comfort.  Controlling fevers with ibuprofen or acetaminophen as directed by your caregiver.  Increasing usage of your inhaler if you have asthma. Zinc gel and zinc lozenges, taken in the first 24 hours of the common cold, can shorten the duration and lessen the severity of symptoms. Pain medicines may help with fever, muscle aches, and throat pain. A variety of non-prescription medicines are available to treat congestion and runny nose. Your caregiver   can make recommendations and may suggest nasal or lung inhalers for other symptoms.  HOME CARE INSTRUCTIONS   Only take over-the-counter or prescription medicines for pain, discomfort, or fever as directed by your  caregiver.  Use a warm mist humidifier or inhale steam from a shower to increase air moisture. This may keep secretions moist and make it easier to breathe.  Drink enough water and fluids to keep your urine clear or pale yellow.  Rest as needed.  Return to work when your temperature has returned to normal or as your caregiver advises. You may need to stay home longer to avoid infecting others. You can also use a face mask and careful hand washing to prevent spread of the virus. SEEK MEDICAL CARE IF:   After the first few days, you feel you are getting worse rather than better.  You need your caregiver's advice about medicines to control symptoms.  You develop chills, worsening shortness of breath, or brown or red sputum. These may be signs of pneumonia.  You develop yellow or brown nasal discharge or pain in the face, especially when you bend forward. These may be signs of sinusitis.  You develop a fever, swollen neck glands, pain with swallowing, or white areas in the back of your throat. These may be signs of strep throat. SEEK IMMEDIATE MEDICAL CARE IF:   You have a fever.  You develop severe or persistent headache, ear pain, sinus pain, or chest pain.  You develop wheezing, a prolonged cough, cough up blood, or have a change in your usual mucus (if you have chronic lung disease).  You develop sore muscles or a stiff neck. Document Released: 06/20/2000 Document Revised: 03/19/2011 Document Reviewed: 04/01/2013 [employer] Patient Information 2015 [employer]. This information is not intended to replace advice given to you by your health care provider. Make sure you discuss any questions you have with your health care provider.  

## 2014-09-13 NOTE — ED Provider Notes (Signed)
URI symptoms, cough, left sided CP CXR pending r/o pna  If neg - home supportive care Treat if positive.   CXR negative. Patient given instructions on URI and encouraged to follow up with PCP as needed.  Charlann Lange, PA-C 09/13/14 1834  Ripley Fraise, MD 09/13/14 [phone]

## 2014-09-23 ENCOUNTER — Encounter (HOSPITAL_COMMUNITY): Payer: Self-pay | Admitting: Psychiatry

## 2014-09-23 ENCOUNTER — Ambulatory Visit (INDEPENDENT_AMBULATORY_CARE_PROVIDER_SITE_OTHER): Payer: Federal, State, Local not specified - PPO | Admitting: Psychiatry

## 2014-09-23 ENCOUNTER — Ambulatory Visit (HOSPITAL_COMMUNITY): Payer: Federal, State, Local not specified - PPO | Admitting: Psychiatry

## 2014-09-23 VITALS — BP 108/70 | HR 71 | Ht 63.0 in | Wt 277.0 lb

## 2014-09-23 DIAGNOSIS — F332 Major depressive disorder, recurrent severe without psychotic features: Secondary | ICD-10-CM

## 2014-09-23 DIAGNOSIS — F4010 Social phobia, unspecified: Secondary | ICD-10-CM | POA: Diagnosis not present

## 2014-09-23 DIAGNOSIS — F411 Generalized anxiety disorder: Secondary | ICD-10-CM | POA: Diagnosis not present

## 2014-09-23 MED ORDER — FLUOXETINE HCL 20 MG PO TABS: 40.0000 mg | ORAL_TABLET | Freq: Every day | ORAL | Status: DC
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
## 2014-09-28 LAB — OB RESULTS CONSOLE HIV ANTIBODY (ROUTINE TESTING): HIV: NONREACTIVE

## 2014-09-28 LAB — OB RESULTS CONSOLE RPR: RPR: NONREACTIVE

## 2014-09-30 ENCOUNTER — Ambulatory Visit (INDEPENDENT_AMBULATORY_CARE_PROVIDER_SITE_OTHER): Payer: Federal, State, Local not specified - PPO | Admitting: Psychology

## 2014-09-30 DIAGNOSIS — F332 Major depressive disorder, recurrent severe without psychotic features: Secondary | ICD-10-CM
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
## 2014-10-13 ENCOUNTER — Encounter: Payer: Federal, State, Local not specified - PPO | Attending: Obstetrics and Gynecology

## 2014-10-13 VITALS — Ht 63.0 in | Wt 274.8 lb

## 2014-10-13 DIAGNOSIS — O24419 Gestational diabetes mellitus in pregnancy, unspecified control: Secondary | ICD-10-CM | POA: Insufficient documentation

## 2014-10-13 DIAGNOSIS — Z713 Dietary counseling and surveillance: Secondary | ICD-10-CM | POA: Insufficient documentation

## 2014-10-19 NOTE — Progress Notes (Signed)
  Patient was seen on 10/13/14 for Gestational Diabetes self-management . The following learning objectives were met by the patient :   States the definition of Gestational Diabetes  States why dietary management is important in controlling blood glucose  Describes the effects of carbohydrates on blood glucose levels  Demonstrates ability to create a balanced meal plan  Demonstrates carbohydrate counting   States when to check blood glucose levels  Demonstrates proper blood glucose monitoring techniques  States the effect of stress and exercise on blood glucose levels  States the importance of limiting caffeine and abstaining from alcohol and smoking  Plan:  Aim for 2 Carb Choices per meal (30 grams) +/- 1 either way for breakfast Aim for 3 Carb Choices per meal (45 grams) +/- 1 either way from lunch and dinner Aim for 1-2 Carbs per snack Begin reading food labels for Total Carbohydrate and sugar grams of foods Consider  increasing your activity level by walking daily as tolerated Begin checking BG before breakfast and 1-2 hours after first bit of breakfast, lunch and dinner after  as directed by MD  Take medication  as directed by MD  Blood glucose monitor given: One Touch Verio Flex Lot # [id number] X Exp: 12/2015 Blood glucose reading: 176m/dl  Patient instructed to monitor glucose levels: FBS: 60 - <90 1 hour: <140 2 hour: <120  Patient received the following handouts:  Nutrition Diabetes and Pregnancy  Carbohydrate Counting List  Meal Planning worksheet  Patient will be seen for follow-up as needed.

## 2014-10-25 ENCOUNTER — Ambulatory Visit (INDEPENDENT_AMBULATORY_CARE_PROVIDER_SITE_OTHER): Payer: Federal, State, Local not specified - PPO | Admitting: Psychology

## 2014-10-25 DIAGNOSIS — F331 Major depressive disorder, recurrent, moderate: Secondary | ICD-10-CM

## 2014-10-26 ENCOUNTER — Other Ambulatory Visit: Payer: Self-pay | Admitting: Obstetrics and Gynecology

## 2014-10-27 NOTE — Progress Notes (Signed)
   THERAPIST PROGRESS NOTE  Session Time: 3.30pm-4.35pm  Participation Level: Active  Behavioral Response: Well GroomedAlertDepressed  Type of Therapy: Individual Therapy  Treatment Goals addressed: Diagnosis: MDD and goal 1.  Interventions: CBT, Supportive and Other: Self compassion \  Summary: [name] is a 34 y.o. female who presents with affect congruent w/ report of depressed mood. Pt at time tangential in thought process, but was able to recognize and redirect.  Pt did report that her and husband did take time together to go out and celebrate birthday.  Pt reported that she really enjoyed this time and was beneficial for mood and relationship. Pt acknowledged the importance of continuing this even if date night at home. Pt reported that she still finds that she is easily defensive and emotional but was able to reframe from lashing back in recent interactions.  Pt still struggling w/ a lot of fatigue and loss of interest.  Pt recognizes low self worth and how this has played out over relationships in the past.  Pt was able to gain insight that although stated to husband that forgave for infidelity that she hasn't fully forgiven and that acknowledged that this is process and working together towards repairing relationship.   Suicidal/Homicidal: Nowithout intent/plan  Therapist Response: Assessed pt current functioning per pt report.  Explored w/ pt her goals for counseling and developed tx plan.  Processed w/pt her negative self worth statements and how this has effect relationships in past and present.  Discussed self compassion concepts and need for pt to reframe thinking w/ self compassion. Explored w/pt emotions related to stressors in marriage over past 2 years.  psychoeducation re: forgiveness and seeing as process not a one time event.  Discussed ways of connecting, communication and continue towards growth in relationship.   Plan: Return again in 2  weeks.  Diagnosis: MDD    [name], [facility] 10/27/2014

## 2014-11-08 ENCOUNTER — Ambulatory Visit (INDEPENDENT_AMBULATORY_CARE_PROVIDER_SITE_OTHER): Payer: Federal, State, Local not specified - PPO | Admitting: Psychology

## 2014-11-08 DIAGNOSIS — F331 Major depressive disorder, recurrent, moderate: Secondary | ICD-10-CM | POA: Diagnosis not present
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
## 2014-11-09 ENCOUNTER — Ambulatory Visit (INDEPENDENT_AMBULATORY_CARE_PROVIDER_SITE_OTHER): Payer: Federal, State, Local not specified - PPO | Admitting: Psychiatry

## 2014-11-09 ENCOUNTER — Encounter (HOSPITAL_COMMUNITY): Payer: Self-pay | Admitting: Psychiatry

## 2014-11-09 VITALS — BP 123/78 | HR 96 | Ht 63.0 in | Wt 274.0 lb

## 2014-11-09 DIAGNOSIS — F332 Major depressive disorder, recurrent severe without psychotic features: Secondary | ICD-10-CM

## 2014-11-09 DIAGNOSIS — F4010 Social phobia, unspecified: Secondary | ICD-10-CM | POA: Diagnosis not present

## 2014-11-09 DIAGNOSIS — F411 Generalized anxiety disorder: Secondary | ICD-10-CM | POA: Diagnosis not present

## 2014-11-09 MED ORDER — FLUOXETINE HCL 20 MG PO TABS: 40.0000 mg | ORAL_TABLET | Freq: Every day | ORAL | Status: DC
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
## 2014-11-24 ENCOUNTER — Ambulatory Visit (HOSPITAL_COMMUNITY): Payer: Self-pay | Admitting: Psychology

## 2014-11-29 ENCOUNTER — Inpatient Hospital Stay (HOSPITAL_COMMUNITY)
Admission: AD | Admit: 2014-11-29 | Discharge: 2014-11-29 | Disposition: A | Discharge status: Home / Self Care | Payer: Federal, State, Local not specified - PPO | Source: Ambulatory Visit | Attending: Obstetrics and Gynecology | Admitting: Obstetrics and Gynecology

## 2014-11-29 ENCOUNTER — Other Ambulatory Visit: Payer: Self-pay | Admitting: Certified Nurse Midwife

## 2014-11-29 ENCOUNTER — Encounter (HOSPITAL_COMMUNITY): Payer: Self-pay | Admitting: *Deleted

## 2014-11-29 DIAGNOSIS — O4693 Antepartum hemorrhage, unspecified, third trimester: Secondary | ICD-10-CM | POA: Insufficient documentation

## 2014-11-29 DIAGNOSIS — Z8619 Personal history of other infectious and parasitic diseases: Secondary | ICD-10-CM

## 2014-11-29 DIAGNOSIS — O99820 Streptococcus B carrier state complicating pregnancy: Secondary | ICD-10-CM | POA: Diagnosis not present

## 2014-11-29 DIAGNOSIS — N939 Abnormal uterine and vaginal bleeding, unspecified: Secondary | ICD-10-CM

## 2014-11-29 DIAGNOSIS — O34219 Maternal care for unspecified type scar from previous cesarean delivery: Secondary | ICD-10-CM | POA: Diagnosis not present

## 2014-11-29 DIAGNOSIS — Z3A36 36 weeks gestation of pregnancy: Secondary | ICD-10-CM | POA: Insufficient documentation

## 2014-11-29 DIAGNOSIS — Z87891 Personal history of nicotine dependence: Secondary | ICD-10-CM | POA: Insufficient documentation

## 2014-11-29 LAB — CBC
HEMATOCRIT: 33.1 % — AB (ref 36.0–46.0)
HEMOGLOBIN: 11.2 g/dL — AB (ref 12.0–15.0)
MCH: 30.8 pg (ref 26.0–34.0)
MCHC: 33.8 g/dL (ref 30.0–36.0)
MCV: 90.9 fL (ref 78.0–100.0)
Platelets: 181 10*3/uL (ref 150–400)
RBC: 3.64 MIL/uL — ABNORMAL LOW (ref 3.87–5.11)
RDW: 15.2 % (ref 11.5–15.5)
WBC: 7.5 10*3/uL (ref 4.0–10.5)

## 2014-11-29 LAB — GLUCOSE, CAPILLARY
Glucose-Capillary: 46 mg/dL — ABNORMAL LOW (ref 65–99)
Glucose-Capillary: 117 mg/dL — ABNORMAL HIGH (ref 65–99)
Glucose-Capillary: 218 mg/dL — ABNORMAL HIGH (ref 65–99)

## 2014-11-29 MED ORDER — DEXTROSE 50 % IV SOLN
1.0000 | Freq: Once | INTRAVENOUS | Status: AC
  Administered 2014-11-29: 50 mL via INTRAVENOUS
  Filled 2014-11-29: qty 50

## 2014-11-29 NOTE — MAU Note (Signed)
Pt up to bathroom and states scant amount of spotting when she wipes

## 2014-11-29 NOTE — Discharge Instructions (Signed)
Vaginal Bleeding During Pregnancy, Third Trimester A small amount of bleeding (spotting) from the vagina is relatively common in pregnancy. Various things can cause bleeding or spotting in pregnancy. Sometimes the bleeding is normal and is not a problem. However, bleeding during the third trimester can also be a sign of something serious for the mother and the baby. Be sure to tell your health care provider about any vaginal bleeding right away.  Some possible causes of vaginal bleeding during the third trimester include:   The placenta may be partially or completely covering the opening to the cervix (placenta previa).   The placenta may have separated from the uterus (abruption of the placenta).   There may be an infection or growth on the cervix.   You may be starting labor, called discharging of the mucus plug.   The placenta may grow into the muscle layer of the uterus (placenta accreta).  HOME CARE INSTRUCTIONS  Watch your condition for any changes. The following actions may help to lessen any discomfort you are feeling:   Follow your health care provider's instructions for limiting your activity. If your health care provider orders bed rest, you may need to stay in bed and only get up to use the bathroom. However, your health care provider may allow you to continue light activity.  If needed, make plans for someone to help with your regular activities and responsibilities while you are on bed rest.  Keep track of the number of pads you use each day, how often you change pads, and how soaked (saturated) they are. Write this down.  Do not use tampons. Do not douche.  Do not have sexual intercourse or orgasms until approved by your health care provider.  Follow your health care provider's advice about lifting, driving, and physical activities.  If you pass any tissue from your vagina, save the tissue so you can show it to your health care provider.   Only take over-the-counter  or prescription medicines as directed by your health care provider.  Do not take aspirin because it can make you bleed.   Keep all follow-up appointments as directed by your health care provider. SEEK MEDICAL CARE IF:  You have any vaginal bleeding during any part of your pregnancy.  You have cramps or labor pains.  You have a fever, not controlled by medicine. SEEK IMMEDIATE MEDICAL CARE IF:   You have severe cramps or pain in your back or belly (abdomen).  You have chills.  You have a gush of fluid from the vagina.  You pass large clots or tissue from your vagina.  Your bleeding increases.  You feel light-headed or weak.  You pass out.  You feel less movement or no movement of the baby.  MAKE SURE YOU:  Understand these instructions.  Will watch your condition.  Will get help right away if you are not doing well or get worse.   This information is not intended to replace advice given to you by your health care provider. Make sure you discuss any questions you have with your health care provider.   Document Released: 03/17/2002 Document Revised: 12/30/2012 Document Reviewed: 09/01/2012 Elsevier Interactive Patient Education [employer].

## 2014-11-29 NOTE — MAU Note (Signed)
No bleeding noted on peripad.

## 2014-11-29 NOTE — MAU Provider Note (Signed)
History   34 yo, G3P2, 36.2 wks sent over from [place] office for vaginal bleeding after a cervical exam in the office.  Bleeding was a small trickel.   Pt had a US/BPP today,  BPP 8/8, vertex, AFI normal, Placenta is posterior with No previa.  She is scheduled for a repeat cesarean section with tubal on 12/18/14.   Tubal paper signed today. Pt is GBS+ with hx of  HSV- previous C/s i 2009  performed due to  Active HSV infection.    Pt to be kept NPO while evaluated.   Patient Active Problem List   Diagnosis Date Noted  . Hx of herpes simplex infection 11/29/2014  . Major depressive disorder, recurrent, severe without psychotic features ([place]) 09/23/2014  . GAD (generalized anxiety disorder) 09/23/2014  . Social anxiety disorder 09/23/2014  . Routine general medical examination at a health care facility 01/07/2014  . Acute sinus infection 12/02/2012  . Upper airway resistance syndrome 11/26/2011  . Obesity 05/17/2011  . Right knee pain 02/16/2011  . Depression 02/16/2011  . Hx gestational diabetes 02/16/2011  . Eczema 02/16/2011    Chief Complaint  Patient presents with  . Vaginal Bleeding   HPI  OB History    Gravida Para Term Preterm AB TAB SAB Ectopic Multiple Living   3 2        2       Past Medical History  Diagnosis Date  . Depression   . Cluster headaches   . GERD (gastroesophageal reflux disease)   . Hyperlipidemia   . Hypertension   . Snoring     sleep study 02/2012 with min AHI events  . Gestational diabetes 2012  . Pregnant and not yet delivered in second trimester     Past Surgical History  Procedure Laterality Date  . Ganglion removal from (l) wrist  1998  . Cesarean section  8 & 11    x's 2  . Wisdom tooth extraction      Family History  Problem Relation Age of Onset  . Arthritis Mother   . Alcohol abuse Mother   . Mental illness Mother   . Cancer Mother 79    squamous - unknown primary  . Coronary artery disease Mother 33    MI with stent  .  Anxiety disorder Mother   . Depression Mother   . Arthritis Father   . Alcohol abuse Father   . Hyperlipidemia Father   . Heart disease Father   . Kidney disease Father   . Stroke Father   . Mental illness Father   . Drug abuse Father   . Diabetes Maternal Grandmother   . Heart disease Maternal Grandmother   . Stroke Maternal Grandfather   . Hypertension Paternal Grandfather   . Hyperlipidemia Paternal Grandfather   . Arthritis Other   . Alcohol abuse Other   . Breast cancer Paternal Aunt   . Anxiety disorder Sister     Social History  Substance Use Topics  . Smoking status: Former Smoker -- 0.33 packs/day for 0 years    Quit date: 04/23/2014  . Smokeless tobacco: Never Used  . Alcohol Use: No     Comment: once a month had one drink prior to pregnancy     Allergies:  Allergies  Allergen Reactions  . Codeine     STOMACH CRAMPING  . Doxycycline Nausea And Vomiting    Prescriptions prior to admission  Medication Sig Dispense Refill Last Dose  . CVS D3 2000 UNITS  CAPS Take 4,000 Units by mouth at bedtime.   0 11/28/2014 at Unknown time  . FLUoxetine (PROZAC) 20 MG tablet Take 2 tablets (40 mg total) by mouth daily. 60 tablet 1 11/29/2014 at Unknown time  . glyBURIDE (DIABETA) 5 MG tablet Take 5 mg by mouth 2 (two) times daily with a meal.    11/29/2014 at Unknown time  . Prenatal Vit-Fe Fumarate-FA (PNV PRENATAL PLUS MULTIVITAMIN) 27-1 MG TABS Take 1 tablet by mouth daily.  11 11/28/2014 at Unknown time    ROS Physical Exam   Blood pressure 131/82, pulse 85, temperature 98.3 F (36.8 C), resp. rate 18, height 5\' 3"  (1.6 m), weight 127.007 kg (280 lb).   Results for orders placed or performed during the hospital encounter of 11/29/14 (from the past 24 hour(s))  CBC     Status: Abnormal   Collection Time: 11/29/14  1:05 PM  Result Value Ref Range   WBC 7.5 4.0 - 10.5 K/uL   RBC 3.64 (L) 3.87 - 5.11 MIL/uL   Hemoglobin 11.2 (L) 12.0 - 15.0 g/dL   HCT 33.1 (L) 36.0  - 46.0 %   MCV 90.9 78.0 - 100.0 fL   MCH 30.8 26.0 - 34.0 pg   MCHC 33.8 30.0 - 36.0 g/dL   RDW 15.2 11.5 - 15.5 %   Platelets 181 150 - 400 K/uL  Glucose, capillary     Status: Abnormal   Collection Time: 11/29/14  2:24 PM  Result Value Ref Range   Glucose-Capillary 46 (L) 65 - 99 mg/dL  Glucose, capillary     Status: Abnormal   Collection Time: 11/29/14  2:53 PM  Result Value Ref Range   Glucose-Capillary 218 (H) 65 - 99 mg/dL  Glucose, capillary     Status: Abnormal   Collection Time: 11/29/14  3:11 PM  Result Value Ref Range   Glucose-Capillary 117 (H) 65 - 99 mg/dL    FHR  Cat 1 tracing, 130 bpm, moderate variability, +accels, no decels UC  None    Physical Exam  ED Course    Assessment: No bleeding Noted  Cat tracing   Plan: May eat Discharge home Call if Sx return or get worse Keep ROB/US appt on 12/06/14  [place], MSN 11/29/2014 3:16 PM

## 2014-11-29 NOTE — MAU Note (Signed)
Lavetta Nielsen CNM notified of pt's blood sugar.

## 2014-11-29 NOTE — MAU Note (Signed)
Pt presents to MAU with complaints of vaginal bleeding. Pt states she was checked by Dr Charlesetta Garibaldi this morning and was told to come here for evaluation

## 2014-12-08 ENCOUNTER — Ambulatory Visit (INDEPENDENT_AMBULATORY_CARE_PROVIDER_SITE_OTHER): Payer: Federal, State, Local not specified - PPO | Admitting: Psychology

## 2014-12-08 DIAGNOSIS — F330 Major depressive disorder, recurrent, mild: Secondary | ICD-10-CM | POA: Diagnosis not present

## 2014-12-09 NOTE — Progress Notes (Signed)
   THERAPIST PROGRESS NOTE  Session Time: 2.39pm-3.30pm  Participation Level: Active  Behavioral Response: Well GroomedAlertEuthymic  Type of Therapy: Individual Therapy  Treatment Goals addressed: Diagnosis: MDD and goal 1.  Interventions: CBT, Strength-based and Supportive  Summary: [name] is a 35 y.o. female who presents with full and bright affect.  Pt reports that she does feel tired a lot being at the end of her pregnancy.  Pt reports however that she feels happy and reports she hasn't felt this for a long time.  Pt reported that ironic that after counseling she usually receives contact from dad- which did occur last session. Pt reported that she has continued to keep boundary and not respond to him.  Pt reports that she does have a plan for dad visiting at the hospital w/ support of her sister.  Pt discussed self care in last 10 days prior to c-section.  Pt reported some worry about having surgery again- pt was able to identify focus for thoughts that is positive and not worries about worst case scenarios.  Pt acknowledged importance of self care after babies birth as well w/ hx of depression.  Pt has husband for support to allow for sleep.  Pt agrees to contact office or OBGYN if depressive symptoms return post partum.    Suicidal/Homicidal: Nowithout intent/plan  Therapist Response: Assessed pt current functioning per pt report.  Processed w/pt her boundaries she is setting and how this is benefiting her.  Explored w/pt her mood and self care- supports.  Discussed plan for dad visiting in the hospital and reiterated she is in control of setting boundaries and communicating this to those who can support.  Assisted pt in identify self care postpartum to assist in continued recovery of depression.   Plan: Return again in 2 weeks- post partum.  Diagnosis: MDD    [name], [facility] 12/09/2014

## 2014-12-16 ENCOUNTER — Encounter (HOSPITAL_COMMUNITY): Payer: Self-pay

## 2014-12-16 DIAGNOSIS — O24415 Gestational diabetes mellitus in pregnancy, controlled by oral hypoglycemic drugs: Secondary | ICD-10-CM | POA: Diagnosis present

## 2014-12-16 DIAGNOSIS — E785 Hyperlipidemia, unspecified: Secondary | ICD-10-CM | POA: Diagnosis present

## 2014-12-16 NOTE — H&P (Signed)
[name] is a 34 y.o. female, G3P2002 at 13 weeks, presenting for repeat cesarean section and tubal ligation.  Patient pregnancy history significant for treatment for major depressive disorder, GDM-A2, GBS positive, and h/o HSV.  Patient BMI was 50 at last OB visit and EFW 7.5lbs at 35wks.   **Duplicate Note**  Patient Active Problem List   Diagnosis Date Noted  . Hyperlipidemia 12/16/2014  . Gestational diabetes mellitus (GDM) treated with oral hypoglycemic therapy 12/16/2014  . Hx of herpes simplex infection 11/29/2014  . Major depressive disorder, recurrent, severe without psychotic features ([place]) 09/23/2014  . GAD (generalized anxiety disorder) 09/23/2014  . Social anxiety disorder 09/23/2014  . Routine general medical examination at a health care facility 01/07/2014  . Acute sinus infection 12/02/2012  . Upper airway resistance syndrome 11/26/2011  . Obesity 05/17/2011  . Right knee pain 02/16/2011  . Depression 02/16/2011  . Hx gestational diabetes 02/16/2011  . Eczema 02/16/2011    History of present pregnancy: Patient entered care at 8.4 weeks.   EDC of 12/25/2014 was established by Definite LMP of 03/20/2014.   Anatomy scan:  19.2 weeks, with normal findings and an anterior placenta.   Additional [place] evaluations: 9.2wks: U/S reviewed: SIUP, +FHT  19.2wks:  [place]: SIUP, NORMAL FLUID, NORMAL ANATOMY BUT NOT ALL ANATOMY SEEN, FEMALE, CX 3.79 CM, GROWTH 97%. 24.3wks: FU ANATOMY TODAY ALL WNL 31.3wks: GDM: growth U/S reviewed: EFW 2668g (90%), AFI 19.03 (70%), good interval growth , BPP 8/8. 32.5wks: U/S:Singleton pregnancy. Vertex presentation. Anterior placenta. Fluid is normal-AFI=55th%. Cervix not seen per protocol. BPP 8/8 in 15 minutes. 34.2wks: EFW 82% WITH AC 98%, AFI 16.  35.2wks: BPP 8/8 TODAY, EFW 7+5, 96%ILE, AFI 16.48, 60%ILE, VTX. 38.2wks: U/S: Singleton pregnancy. Vertex presentation. Posterior placenta. AFI is normal-70th%. BPP=8/8 in 4 mins Significant  prenatal events: 1st Trimester: 2nd Trimester: 3rd Trimester:    Last evaluation: In office on 12/13/2014 by A. Roberts at 38.2wks: FHR 136, BP 114/72, Wt 282lbs  OB History    Gravida Para Term Preterm AB TAB SAB Ectopic Multiple Living   3 2        2      Past Medical History  Diagnosis Date  . Depression   . Cluster headaches   . GERD (gastroesophageal reflux disease)   . Hyperlipidemia   . Hypertension   . Snoring     sleep study 02/2012 with min AHI events  . Gestational diabetes 2012  . Pregnant and not yet delivered in second trimester    Past Surgical History  Procedure Laterality Date  . Ganglion removal from (l) wrist  1998  . Cesarean section  09 & 12    x's 2  . Wisdom tooth extraction     Family History: family history includes Alcohol abuse in her father, mother, and other; Anxiety disorder in her mother and sister; Arthritis in her father, mother, and other; Breast cancer in her paternal aunt; Cancer (age of onset: 11) in her mother; Coronary artery disease (age of onset: 70) in her mother; Depression in her mother; Diabetes in her maternal grandmother; Drug abuse in her father; Heart disease in her father and maternal grandmother; Hyperlipidemia in her father and paternal grandfather; Hypertension in her paternal grandfather; Kidney disease in her father; Mental illness in her father and mother; Stroke in her father and maternal grandfather. Social History:  reports that she quit smoking about 7 months ago. She has never used smokeless tobacco. She reports that she does not  drink alcohol or use illicit drugs.   Prenatal Transfer Tool  Maternal Diabetes: Yes:  Diabetes Type:  Insulin/Medication controlled Genetic Screening: Normal-AFP Maternal Ultrasounds/Referrals: Normal Fetal Ultrasounds or other Referrals:  Fetal echo Maternal Substance Abuse:  No Significant Maternal Medications:  Meds include: Other: Glyburide 7.5mg , Fluoxentine 20mg  Significant Maternal Lab  Results: Lab values include: Group B Strep positive    \  Allergies  Allergen Reactions  . Codeine     STOMACH CRAMPING  . Doxycycline Nausea And Vomiting       There were no vitals taken for this visit.  Physical Exam  Prenatal labs: ABO, Rh:  O Positive Antibody:  Negative Rubella:  Immune RPR:   NR HBsAg:   Negative HIV:   NR GBS:  Positive Sickle cell/Hgb electrophoresis:  Normal Pap:  Normal-2015 GC:  Negative Chlamydia:  Negative Other:  TSH Normal, Vit D 28    Assessment IUP at 39wks Repeat Cesarean Section Desires Permanent Sterilization Major Depressive Disorder Obesity-BMI 50 GDM-A2 Hyperlipidemia   Plan: Admit to [facility] per consult with Dr. Delane Ginger. Dillard Routine Orders Pre-Op Obtain BS Will restart Fluoxentine 20mg  in [place], MSN 12/16/2014, 1:19 AM

## 2014-12-16 NOTE — Patient Instructions (Addendum)
Your procedure is scheduled on:12/18/14  Enter through the Main Entrance at :10 am Pick up desk phone and dial [phone] and inform us of your arrival.  Please call [phone] if you have any problems the morning of surgery.  Remember: Do not eat food after midnight:tonight Clear liquids are ok until:7am Saturday   You may brush your teeth the morning of surgery.  Take these meds the morning of surgery with a sip of water:Prozac, Valtrex  DO NOT wear jewelry, eye make-up, lipstick,body lotion, or dark fingernail polish.  (Polished toes are ok) You may wear deodorant.  If you are to be admitted after surgery, leave suitcase in car until your room has been assigned.  Wear loose fitting, comfortable clothes for your ride home.

## 2014-12-17 ENCOUNTER — Encounter (HOSPITAL_COMMUNITY)
Admission: RE | Admit: 2014-12-17 | Discharge: 2014-12-17 | Disposition: A | Discharge status: Home / Self Care | Payer: Federal, State, Local not specified - PPO | Source: Ambulatory Visit | Attending: Obstetrics and Gynecology | Admitting: Obstetrics and Gynecology

## 2014-12-17 ENCOUNTER — Other Ambulatory Visit: Payer: Self-pay | Admitting: Obstetrics and Gynecology

## 2014-12-17 ENCOUNTER — Encounter (HOSPITAL_COMMUNITY): Payer: Self-pay

## 2014-12-17 HISTORY — DX: Sciatica, unspecified side: M54.30

## 2014-12-17 HISTORY — DX: Dermatitis, unspecified: L30.9

## 2014-12-17 HISTORY — DX: Anxiety disorder, unspecified: F41.9

## 2014-12-17 LAB — BASIC METABOLIC PANEL
Anion gap: 9 (ref 5–15)
BUN: 10 mg/dL (ref 6–20)
CALCIUM: 9 mg/dL (ref 8.9–10.3)
CO2: 21 mmol/L — ABNORMAL LOW (ref 22–32)
CREATININE: 0.68 mg/dL (ref 0.44–1.00)
Chloride: 101 mmol/L (ref 101–111)
Glucose, Bld: 68 mg/dL (ref 65–99)
Potassium: 3.8 mmol/L (ref 3.5–5.1)
SODIUM: 131 mmol/L — AB (ref 135–145)

## 2014-12-17 LAB — CBC
HCT: 35.9 % — ABNORMAL LOW (ref 36.0–46.0)
Hemoglobin: 11.8 g/dL — ABNORMAL LOW (ref 12.0–15.0)
MCH: 29.9 pg (ref 26.0–34.0)
MCHC: 32.9 g/dL (ref 30.0–36.0)
MCV: 90.9 fL (ref 78.0–100.0)
PLATELETS: 152 10*3/uL (ref 150–400)
RBC: 3.95 MIL/uL (ref 3.87–5.11)
RDW: 14.8 % (ref 11.5–15.5)
WBC: 7.7 10*3/uL (ref 4.0–10.5)

## 2014-12-17 MED ORDER — DEXTROSE 5 % IV SOLN
3.0000 g | INTRAVENOUS | Status: DC
  Filled 2014-12-17: qty 3000

## 2014-12-18 ENCOUNTER — Encounter (HOSPITAL_COMMUNITY)
Admission: AD | Disposition: A | Discharge status: Home / Self Care | Payer: Self-pay | Source: Ambulatory Visit | Attending: Obstetrics and Gynecology

## 2014-12-18 ENCOUNTER — Inpatient Hospital Stay (HOSPITAL_COMMUNITY)
Admission: RE | Admit: 2014-12-18 | Payer: Federal, State, Local not specified - PPO | Source: Ambulatory Visit | Admitting: Obstetrics and Gynecology

## 2014-12-18 ENCOUNTER — Encounter (HOSPITAL_COMMUNITY): Payer: Self-pay | Admitting: Anesthesiology

## 2014-12-18 ENCOUNTER — Encounter (HOSPITAL_COMMUNITY): Payer: Self-pay | Admitting: *Deleted

## 2014-12-18 ENCOUNTER — Inpatient Hospital Stay (HOSPITAL_COMMUNITY): Payer: Federal, State, Local not specified - PPO | Admitting: Anesthesiology

## 2014-12-18 ENCOUNTER — Inpatient Hospital Stay (HOSPITAL_COMMUNITY)
Admission: AD | Admit: 2014-12-18 | Discharge: 2014-12-22 | DRG: 765 | Disposition: A | Discharge status: Home / Self Care | Payer: Federal, State, Local not specified - PPO | Source: Ambulatory Visit | Attending: Obstetrics and Gynecology | Admitting: Obstetrics and Gynecology

## 2014-12-18 DIAGNOSIS — Z6841 Body Mass Index (BMI) 40.0 and over, adult: Secondary | ICD-10-CM

## 2014-12-18 DIAGNOSIS — Z833 Family history of diabetes mellitus: Secondary | ICD-10-CM

## 2014-12-18 DIAGNOSIS — Z823 Family history of stroke: Secondary | ICD-10-CM | POA: Diagnosis not present

## 2014-12-18 DIAGNOSIS — Z98891 History of uterine scar from previous surgery: Secondary | ICD-10-CM

## 2014-12-18 DIAGNOSIS — O99824 Streptococcus B carrier state complicating childbirth: Secondary | ICD-10-CM | POA: Diagnosis present

## 2014-12-18 DIAGNOSIS — O99214 Obesity complicating childbirth: Secondary | ICD-10-CM | POA: Diagnosis present

## 2014-12-18 DIAGNOSIS — O99344 Other mental disorders complicating childbirth: Secondary | ICD-10-CM | POA: Diagnosis present

## 2014-12-18 DIAGNOSIS — F332 Major depressive disorder, recurrent severe without psychotic features: Secondary | ICD-10-CM | POA: Diagnosis present

## 2014-12-18 DIAGNOSIS — O9081 Anemia of the puerperium: Secondary | ICD-10-CM | POA: Diagnosis not present

## 2014-12-18 DIAGNOSIS — O9962 Diseases of the digestive system complicating childbirth: Secondary | ICD-10-CM | POA: Diagnosis present

## 2014-12-18 DIAGNOSIS — D649 Anemia, unspecified: Secondary | ICD-10-CM | POA: Diagnosis not present

## 2014-12-18 DIAGNOSIS — Z3A39 39 weeks gestation of pregnancy: Secondary | ICD-10-CM | POA: Diagnosis not present

## 2014-12-18 DIAGNOSIS — Z87891 Personal history of nicotine dependence: Secondary | ICD-10-CM | POA: Diagnosis not present

## 2014-12-18 DIAGNOSIS — O34219 Maternal care for unspecified type scar from previous cesarean delivery: Principal | ICD-10-CM | POA: Diagnosis present

## 2014-12-18 DIAGNOSIS — O24425 Gestational diabetes mellitus in childbirth, controlled by oral hypoglycemic drugs: Secondary | ICD-10-CM | POA: Diagnosis present

## 2014-12-18 DIAGNOSIS — Z302 Encounter for sterilization: Secondary | ICD-10-CM | POA: Diagnosis not present

## 2014-12-18 DIAGNOSIS — E785 Hyperlipidemia, unspecified: Secondary | ICD-10-CM | POA: Diagnosis present

## 2014-12-18 DIAGNOSIS — Z8249 Family history of ischemic heart disease and other diseases of the circulatory system: Secondary | ICD-10-CM

## 2014-12-18 DIAGNOSIS — O99284 Endocrine, nutritional and metabolic diseases complicating childbirth: Secondary | ICD-10-CM | POA: Diagnosis present

## 2014-12-18 DIAGNOSIS — K219 Gastro-esophageal reflux disease without esophagitis: Secondary | ICD-10-CM | POA: Diagnosis present

## 2014-12-18 LAB — GLUCOSE, CAPILLARY
GLUCOSE-CAPILLARY: 55 mg/dL — AB (ref 65–99)
GLUCOSE-CAPILLARY: 84 mg/dL (ref 65–99)
GLUCOSE-CAPILLARY: 60 mg/dL — AB (ref 65–99)
Glucose-Capillary: 70 mg/dL (ref 65–99)

## 2014-12-18 LAB — PREPARE RBC (CROSSMATCH)

## 2014-12-18 LAB — RPR: RPR: NONREACTIVE

## 2014-12-18 SURGERY — Surgical Case
Anesthesia: Spinal | Laterality: Bilateral

## 2014-12-18 SURGERY — Surgical Case
Anesthesia: Regional

## 2014-12-18 MED ORDER — KETOROLAC TROMETHAMINE 30 MG/ML IJ SOLN
30.0000 mg | Freq: Four times a day (QID) | INTRAMUSCULAR | Status: AC | PRN
Start: 2014-12-18 — End: 2014-12-19
  Administered 2014-12-18: 30 mg via INTRAMUSCULAR

## 2014-12-18 MED ORDER — OXYCODONE-ACETAMINOPHEN 5-325 MG PO TABS
1.0000 | ORAL_TABLET | ORAL | Status: DC | PRN
  Administered 2014-12-20 – 2014-12-21 (×3): 1 via ORAL
  Filled 2014-12-18 (×3): qty 1

## 2014-12-18 MED ORDER — CITRIC ACID-SODIUM CITRATE 334-500 MG/5ML PO SOLN
30.0000 mL | Freq: Once | ORAL | Status: AC
  Administered 2014-12-18: 30 mL via ORAL
  Filled 2014-12-18: qty 15

## 2014-12-18 MED ORDER — MEASLES, MUMPS & RUBELLA VAC ~~LOC~~ INJ
0.5000 mL | INJECTION | Freq: Once | SUBCUTANEOUS | Status: DC
  Filled 2014-12-18: qty 0.5

## 2014-12-18 MED ORDER — OXYTOCIN 10 UNIT/ML IJ SOLN
INTRAMUSCULAR | Status: AC
  Filled 2014-12-18: qty 4

## 2014-12-18 MED ORDER — DEXAMETHASONE SODIUM PHOSPHATE 4 MG/ML IJ SOLN
INTRAMUSCULAR | Status: DC | PRN
Start: 2014-12-18 — End: 2014-12-18
  Administered 2014-12-18: 4 mg via INTRAVENOUS

## 2014-12-18 MED ORDER — WITCH HAZEL-GLYCERIN EX PADS: 1.0000 "application " | MEDICATED_PAD | CUTANEOUS | Status: DC | PRN

## 2014-12-18 MED ORDER — NALBUPHINE HCL 10 MG/ML IJ SOLN: 5.0000 mg | Freq: Once | INTRAMUSCULAR | Status: DC | PRN

## 2014-12-18 MED ORDER — FENTANYL CITRATE (PF) 100 MCG/2ML IJ SOLN
INTRAMUSCULAR | Status: DC | PRN
  Administered 2014-12-18: 20 ug via INTRATHECAL

## 2014-12-18 MED ORDER — FENTANYL CITRATE (PF) 100 MCG/2ML IJ SOLN
INTRAMUSCULAR | Status: AC
  Filled 2014-12-18: qty 2

## 2014-12-18 MED ORDER — LACTATED RINGERS IV SOLN
INTRAVENOUS | Status: DC
  Administered 2014-12-18 (×3): via INTRAVENOUS

## 2014-12-18 MED ORDER — FERROUS SULFATE 325 (65 FE) MG PO TABS
325.0000 mg | ORAL_TABLET | Freq: Two times a day (BID) | ORAL | Status: DC
  Administered 2014-12-19 – 2014-12-22 (×7): 325 mg via ORAL
  Filled 2014-12-18 (×6): qty 1

## 2014-12-18 MED ORDER — PHENYLEPHRINE 8 MG IN D5W 100 ML (0.08MG/ML) PREMIX OPTIME
INJECTION | INTRAVENOUS | Status: AC
Start: 2014-12-18 — End: 2014-12-18
  Filled 2014-12-18: qty 100

## 2014-12-18 MED ORDER — DIPHENHYDRAMINE HCL 25 MG PO CAPS: 25.0000 mg | ORAL_CAPSULE | Freq: Four times a day (QID) | ORAL | Status: DC | PRN

## 2014-12-18 MED ORDER — ONDANSETRON HCL 4 MG/2ML IJ SOLN
INTRAMUSCULAR | Status: DC | PRN
  Administered 2014-12-18: 4 mg via INTRAVENOUS

## 2014-12-18 MED ORDER — FENTANYL CITRATE (PF) 100 MCG/2ML IJ SOLN: 25.0000 ug | INTRAMUSCULAR | Status: DC | PRN

## 2014-12-18 MED ORDER — FAMOTIDINE IN NACL 20-0.9 MG/50ML-% IV SOLN
20.0000 mg | Freq: Once | INTRAVENOUS | Status: AC
  Administered 2014-12-18: 20 mg via INTRAVENOUS
  Filled 2014-12-18: qty 50

## 2014-12-18 MED ORDER — IBUPROFEN 600 MG PO TABS
600.0000 mg | ORAL_TABLET | Freq: Four times a day (QID) | ORAL | Status: DC
  Administered 2014-12-19 – 2014-12-22 (×15): 600 mg via ORAL
  Filled 2014-12-18 (×15): qty 1

## 2014-12-18 MED ORDER — SODIUM CHLORIDE 0.9 % IJ SOLN: 3.0000 mL | INTRAMUSCULAR | Status: DC | PRN

## 2014-12-18 MED ORDER — DEXTROSE IN LACTATED RINGERS 5 % IV SOLN
INTRAVENOUS | Status: DC
  Administered 2014-12-18 (×2): via INTRAVENOUS

## 2014-12-18 MED ORDER — MEPERIDINE HCL 25 MG/ML IJ SOLN: 6.2500 mg | INTRAMUSCULAR | Status: DC | PRN

## 2014-12-18 MED ORDER — OXYCODONE-ACETAMINOPHEN 5-325 MG PO TABS: 2.0000 | ORAL_TABLET | ORAL | Status: DC | PRN

## 2014-12-18 MED ORDER — MENTHOL 3 MG MT LOZG: 1.0000 | LOZENGE | OROMUCOSAL | Status: DC | PRN

## 2014-12-18 MED ORDER — OXYTOCIN 10 UNIT/ML IJ SOLN
40.0000 [IU] | INTRAMUSCULAR | Status: DC | PRN
  Administered 2014-12-18: 40 [IU] via INTRAVENOUS

## 2014-12-18 MED ORDER — ONDANSETRON HCL 4 MG/2ML IJ SOLN
INTRAMUSCULAR | Status: AC
  Filled 2014-12-18: qty 2

## 2014-12-18 MED ORDER — SIMETHICONE 80 MG PO CHEW
80.0000 mg | CHEWABLE_TABLET | Freq: Three times a day (TID) | ORAL | Status: DC
  Administered 2014-12-19 – 2014-12-22 (×8): 80 mg via ORAL
  Filled 2014-12-18 (×8): qty 1

## 2014-12-18 MED ORDER — BISACODYL 10 MG RE SUPP: 10.0000 mg | Freq: Every day | RECTAL | Status: DC | PRN

## 2014-12-18 MED ORDER — ACETAMINOPHEN 325 MG PO TABS: 650.0000 mg | ORAL_TABLET | ORAL | Status: DC | PRN

## 2014-12-18 MED ORDER — SENNOSIDES-DOCUSATE SODIUM 8.6-50 MG PO TABS
2.0000 | ORAL_TABLET | ORAL | Status: DC
  Administered 2014-12-19 – 2014-12-21 (×4): 2 via ORAL
  Filled 2014-12-18 (×4): qty 2

## 2014-12-18 MED ORDER — SCOPOLAMINE 1 MG/3DAYS TD PT72: 1.0000 | MEDICATED_PATCH | Freq: Once | TRANSDERMAL | Status: DC

## 2014-12-18 MED ORDER — SIMETHICONE 80 MG PO CHEW
80.0000 mg | CHEWABLE_TABLET | ORAL | Status: DC
  Administered 2014-12-19 – 2014-12-21 (×3): 80 mg via ORAL
  Filled 2014-12-18 (×4): qty 1

## 2014-12-18 MED ORDER — ZOLPIDEM TARTRATE 5 MG PO TABS
5.0000 mg | ORAL_TABLET | Freq: Every evening | ORAL | Status: DC | PRN
Start: 2014-12-18 — End: 2014-12-22

## 2014-12-18 MED ORDER — KETOROLAC TROMETHAMINE 30 MG/ML IJ SOLN: 30.0000 mg | Freq: Four times a day (QID) | INTRAMUSCULAR | Status: AC | PRN

## 2014-12-18 MED ORDER — OXYTOCIN 40 UNITS IN LACTATED RINGERS INFUSION - SIMPLE MED: 62.5000 mL/h | INTRAVENOUS | Status: AC

## 2014-12-18 MED ORDER — PRENATAL MULTIVITAMIN CH
1.0000 | ORAL_TABLET | Freq: Every day | ORAL | Status: DC
  Administered 2014-12-19 – 2014-12-22 (×4): 1 via ORAL
  Filled 2014-12-18 (×4): qty 1

## 2014-12-18 MED ORDER — MORPHINE SULFATE (PF) 0.5 MG/ML IJ SOLN
INTRAMUSCULAR | Status: DC | PRN
  Administered 2014-12-18: .2 mg via INTRATHECAL

## 2014-12-18 MED ORDER — FLEET ENEMA 7-19 GM/118ML RE ENEM: 1.0000 | ENEMA | Freq: Every day | RECTAL | Status: DC | PRN

## 2014-12-18 MED ORDER — SIMETHICONE 80 MG PO CHEW
80.0000 mg | CHEWABLE_TABLET | ORAL | Status: DC | PRN
  Administered 2014-12-21: 80 mg via ORAL

## 2014-12-18 MED ORDER — LACTATED RINGERS IV SOLN
INTRAVENOUS | Status: DC
  Administered 2014-12-19: 04:00:00 via INTRAVENOUS

## 2014-12-18 MED ORDER — DEXTROSE 5 % IV SOLN: 3.0000 g | INTRAVENOUS | Status: DC

## 2014-12-18 MED ORDER — FLUOXETINE HCL 20 MG PO TABS
40.0000 mg | ORAL_TABLET | Freq: Every day | ORAL | Status: DC
  Administered 2014-12-19 – 2014-12-22 (×4): 40 mg via ORAL
  Filled 2014-12-18 (×5): qty 2

## 2014-12-18 MED ORDER — KETOROLAC TROMETHAMINE 30 MG/ML IJ SOLN
INTRAMUSCULAR | Status: AC
Start: 2014-12-18 — End: 2014-12-19
  Filled 2014-12-18: qty 1

## 2014-12-18 MED ORDER — SCOPOLAMINE 1 MG/3DAYS TD PT72
MEDICATED_PATCH | TRANSDERMAL | Status: DC | PRN
  Administered 2014-12-18: 1 via TRANSDERMAL

## 2014-12-18 MED ORDER — DIBUCAINE 1 % RE OINT: 1.0000 "application " | TOPICAL_OINTMENT | RECTAL | Status: DC | PRN

## 2014-12-18 MED ORDER — LANOLIN HYDROUS EX OINT: 1.0000 "application " | TOPICAL_OINTMENT | CUTANEOUS | Status: DC | PRN

## 2014-12-18 MED ORDER — BUPIVACAINE IN DEXTROSE 0.75-8.25 % IT SOLN
INTRATHECAL | Status: DC | PRN
  Administered 2014-12-18: 1.5 mL via INTRATHECAL

## 2014-12-18 MED ORDER — DEXTROSE 5 % IV SOLN
1.0000 ug/kg/h | INTRAVENOUS | Status: DC | PRN
  Filled 2014-12-18: qty 2

## 2014-12-18 MED ORDER — ONDANSETRON HCL 4 MG/2ML IJ SOLN: 4.0000 mg | Freq: Three times a day (TID) | INTRAMUSCULAR | Status: DC | PRN

## 2014-12-18 MED ORDER — DEXTROSE 5 % IV SOLN
3.0000 g | INTRAVENOUS | Status: DC | PRN
  Administered 2014-12-18: 3 g via INTRAVENOUS

## 2014-12-18 MED ORDER — LACTATED RINGERS IV SOLN
INTRAVENOUS | Status: DC | PRN
  Administered 2014-12-18: 17:00:00 via INTRAVENOUS

## 2014-12-18 MED ORDER — DIPHENHYDRAMINE HCL 50 MG/ML IJ SOLN: 12.5000 mg | INTRAMUSCULAR | Status: DC | PRN

## 2014-12-18 MED ORDER — NALOXONE HCL 0.4 MG/ML IJ SOLN: 0.4000 mg | INTRAMUSCULAR | Status: DC | PRN

## 2014-12-18 MED ORDER — PHENYLEPHRINE 8 MG IN D5W 100 ML (0.08MG/ML) PREMIX OPTIME
INJECTION | INTRAVENOUS | Status: DC | PRN
  Administered 2014-12-18: 20 ug/min via INTRAVENOUS

## 2014-12-18 MED ORDER — MORPHINE SULFATE (PF) 0.5 MG/ML IJ SOLN
INTRAMUSCULAR | Status: AC
  Filled 2014-12-18: qty 10

## 2014-12-18 MED ORDER — NALBUPHINE HCL 10 MG/ML IJ SOLN: 5.0000 mg | INTRAMUSCULAR | Status: DC | PRN

## 2014-12-18 MED ORDER — VALACYCLOVIR HCL 500 MG PO TABS
1000.0000 mg | ORAL_TABLET | Freq: Every day | ORAL | Status: DC
  Administered 2014-12-19 – 2014-12-22 (×4): 1000 mg via ORAL
  Filled 2014-12-18 (×5): qty 2

## 2014-12-18 MED ORDER — DIPHENHYDRAMINE HCL 25 MG PO CAPS
25.0000 mg | ORAL_CAPSULE | ORAL | Status: DC | PRN
Start: 2014-12-18 — End: 2014-12-22
  Filled 2014-12-18: qty 1

## 2014-12-18 MED ORDER — LACTATED RINGERS IV BOLUS (SEPSIS)
1000.0000 mL | Freq: Once | INTRAVENOUS | Status: AC
  Administered 2014-12-18: 1000 mL via INTRAVENOUS

## 2014-12-18 MED ORDER — IBUPROFEN 600 MG PO TABS
600.0000 mg | ORAL_TABLET | Freq: Four times a day (QID) | ORAL | Status: DC | PRN
  Filled 2014-12-18: qty 1

## 2014-12-18 MED ORDER — TETANUS-DIPHTH-ACELL PERTUSSIS 5-2.5-18.5 LF-MCG/0.5 IM SUSP: 0.5000 mL | Freq: Once | INTRAMUSCULAR | Status: DC

## 2014-12-18 MED ORDER — VALACYCLOVIR HCL 500 MG PO TABS: 1000.0000 mg | ORAL_TABLET | Freq: Every day | ORAL | Status: DC

## 2014-12-18 SURGICAL SUPPLY — 43 items
BENZOIN TINCTURE PRP APPL 2/3 (GAUZE/BANDAGES/DRESSINGS) ×2 IMPLANT
CLAMP CORD UMBIL (MISCELLANEOUS) IMPLANT
CLOTH BEACON ORANGE TIMEOUT ST (SAFETY) ×2 IMPLANT
CONTAINER PREFILL 10% NBF 15ML (MISCELLANEOUS) IMPLANT
DRAIN JACKSON PRT FLT 10 (DRAIN) IMPLANT
DRAPE SHEET LG 3/4 BI-LAMINATE (DRAPES) IMPLANT
DRSG OPSITE POSTOP 4X10 (GAUZE/BANDAGES/DRESSINGS) ×2 IMPLANT
DRSG PAD ABDOMINAL 8X10 ST (GAUZE/BANDAGES/DRESSINGS) ×2 IMPLANT
DURAPREP 26ML APPLICATOR (WOUND CARE) ×2 IMPLANT
ELECT REM PT RETURN 9FT ADLT (ELECTROSURGICAL) ×4
ELECTRODE REM PT RTRN 9FT ADLT (ELECTROSURGICAL) ×2 IMPLANT
EVACUATOR SILICONE 100CC (DRAIN) IMPLANT
EXTRACTOR VACUUM M CUP 4 TUBE (SUCTIONS) IMPLANT
GAUZE SPONGE 4X4 12PLY STRL (GAUZE/BANDAGES/DRESSINGS) ×4 IMPLANT
GAUZE SPONGE 4X4 12PLY STRL LF (GAUZE/BANDAGES/DRESSINGS) ×2 IMPLANT
GLOVE BIO SURGEON STRL SZ 6.5 (GLOVE) ×2 IMPLANT
GLOVE BIOGEL PI IND STRL 7.0 (GLOVE) ×2 IMPLANT
GLOVE BIOGEL PI INDICATOR 7.0 (GLOVE) ×2
GOWN STRL REUS W/TWL LRG LVL3 (GOWN DISPOSABLE) ×4 IMPLANT
HEMOSTAT SURGICEL 2X14 (HEMOSTASIS) ×2 IMPLANT
KIT ABG SYR 3ML LUER SLIP (SYRINGE) IMPLANT
NEEDLE HYPO 25X5/8 SAFETYGLIDE (NEEDLE) IMPLANT
NS IRRIG 1000ML POUR BTL (IV SOLUTION) ×2 IMPLANT
PACK C SECTION WH (CUSTOM PROCEDURE TRAY) ×2 IMPLANT
PAD ABD 7.5X8 STRL (GAUZE/BANDAGES/DRESSINGS) ×2 IMPLANT
PAD OB MATERNITY 4.3X12.25 (PERSONAL CARE ITEMS) ×2 IMPLANT
PENCIL SMOKE EVAC W/HOLSTER (ELECTROSURGICAL) ×2 IMPLANT
RTRCTR C-SECT PINK 25CM LRG (MISCELLANEOUS) IMPLANT
SPONGE LAP 18X18 X RAY DECT (DISPOSABLE) ×2 IMPLANT
STRIP CLOSURE SKIN 1/2X4 (GAUZE/BANDAGES/DRESSINGS) ×2 IMPLANT
SUT CHROMIC 0 CT 1 (SUTURE) ×2 IMPLANT
SUT MNCRL AB 3-0 PS2 27 (SUTURE) ×2 IMPLANT
SUT PLAIN 2 0 (SUTURE) ×2
SUT PLAIN 2 0 XLH (SUTURE) ×2 IMPLANT
SUT PLAIN ABS 2-0 CT1 27XMFL (SUTURE) ×2 IMPLANT
SUT SILK 2 0 SH (SUTURE) IMPLANT
SUT VIC AB 0 CTX 36 (SUTURE) ×6
SUT VIC AB 0 CTX36XBRD ANBCTRL (SUTURE) ×6 IMPLANT
SUT VIC AB 2-0 SH 27 (SUTURE) ×3
SUT VIC AB 2-0 SH 27XBRD (SUTURE) ×3 IMPLANT
TAPE STRIPS DRAPE STRL (GAUZE/BANDAGES/DRESSINGS) ×2 IMPLANT
TOWEL OR 17X24 6PK STRL BLUE (TOWEL DISPOSABLE) ×2 IMPLANT
TRAY FOLEY CATH SILVER 14FR (SET/KITS/TRAYS/PACK) ×2 IMPLANT

## 2014-12-18 NOTE — Transfer of Care (Signed)
Immediate Anesthesia Transfer of Care Note  Patient: [name]  Procedure(s) Performed: Procedure(s): REPEAT CESAREAN SECTION WITH BILATERAL TUBAL LIGATION (Bilateral)  Patient Location: PACU  Anesthesia Type:Spinal and Epidural  Level of Consciousness: awake, alert  and oriented  Airway & Oxygen Therapy: Patient Spontanous Breathing  Post-op Assessment: Report given to RN and Post -op Vital signs reviewed and stable  Post vital signs: Reviewed and stable  Last Vitals:  Filed Vitals:   12/18/14 1043 12/18/14 1209  BP: 133/81 130/78  Pulse: 77 73  Temp: 37.2 C   Resp: 18 18    Complications: No apparent anesthesia complications

## 2014-12-18 NOTE — Anesthesia Procedure Notes (Signed)
Spinal Patient location during procedure: OR Start time: 12/18/2014 3:55 PM Staffing Anesthesiologist: Josephine Igo Performed by: anesthesiologist  Preanesthetic Checklist Completed: patient identified, site marked, surgical consent, pre-op evaluation, timeout performed, IV checked, risks and benefits discussed and monitors and equipment checked Spinal Block Patient position: sitting Prep: site prepped and draped and DuraPrep Patient monitoring: cardiac monitor, continuous pulse ox, blood pressure and heart rate Approach: midline Location: L3-4 Injection technique: catheter Needle Needle type: Tuohy and Sprotte  Needle gauge: 24 G Needle length: 12.7 cm Needle insertion depth: 8 cm Catheter type: closed end flexible Catheter size: 19 g Catheter at skin depth: 14 cm Assessment Sensory level: T4 Additional Notes Epidural performed using 17ga Touhy needle LOR with saline. SAB performed with 25ga Spinocan needle. CSF Clear with free flow, transient paresthesia left leg. LA + Narcotics injected. Spinal needle withdrawn and Epidural catheter threaded 6 cm into the epidural space. Patient then placed supine with LUD. Patient tolerated procedure well. Adequate sensory level.

## 2014-12-18 NOTE — MAU Note (Signed)
Pt. Aware of delay of care. Dr. Charlesetta Garibaldi here to talk with patient.

## 2014-12-18 NOTE — Anesthesia Postprocedure Evaluation (Signed)
Anesthesia Post Note  Patient: [name]  Procedure(s) Performed: Procedure(s) (LRB): REPEAT CESAREAN SECTION WITH BILATERAL TUBAL LIGATION (Bilateral)  Patient location during evaluation: PACU Anesthesia Type: Spinal Level of consciousness: oriented and awake and alert Pain management: pain level controlled Vital Signs Assessment: post-procedure vital signs reviewed and stable Respiratory status: spontaneous breathing, respiratory function stable and nonlabored ventilation Cardiovascular status: blood pressure returned to baseline and stable Postop Assessment: no headache, no backache, spinal receding, patient able to bend at knees and no signs of nausea or vomiting Anesthetic complications: no    Last Vitals:  Filed Vitals:   12/18/14 1900 12/18/14 1915  BP: 138/92 139/79  Pulse: 72 73  Temp:  36.9 C  Resp: 16 14    Last Pain: There were no vitals filed for this visit.               Keayra Graham A.

## 2014-12-18 NOTE — Anesthesia Preprocedure Evaluation (Addendum)
Anesthesia Evaluation    Airway Mallampati: III  TM Distance: >3 FB Neck ROM: Full    Dental no notable dental hx. (+) Teeth Intact   Pulmonary former smoker,    Pulmonary exam normal breath sounds clear to auscultation       Cardiovascular hypertension, Normal cardiovascular exam Rhythm:Regular Rate:Normal     Neuro/Psych  Headaches, PSYCHIATRIC DISORDERS Anxiety Depression  Neuromuscular disease    GI/Hepatic negative GI ROS, Neg liver ROS, GERD  Medicated and Controlled,  Endo/Other  diabetes, Well Controlled, Gestational, Oral Hypoglycemic AgentsMorbid obesityHyperlipidemia   Renal/GU negative Renal ROS  negative genitourinary   Musculoskeletal Sciatica   Abdominal (+) + obese,   Peds  Hematology  (+) anemia ,   Anesthesia Other Findings   Reproductive/Obstetrics (+) Pregnancy Previous C/Section Desires Sterilization                           Anesthesia Physical Anesthesia Plan  ASA: III  Anesthesia Plan: Combined Spinal and Epidural   Post-op Pain Management:    Induction:   Airway Management Planned: Natural Airway  Additional Equipment:   Intra-op Plan:   Post-operative Plan:   Informed Consent: I have reviewed the patients History and Physical, chart, labs and discussed the procedure including the risks, benefits and alternatives for the proposed anesthesia with the patient or authorized representative who has indicated his/her understanding and acceptance.     Plan Discussed with: Anesthesiologist, CRNA and Surgeon  Anesthesia Plan Comments:       Anesthesia Quick Evaluation

## 2014-12-18 NOTE — H&P (Signed)
[name] is a 34 y.o. female,  G3P2002 at 59 weeks, presenting for repeat cesarean section and tubal ligation. Patient pregnancy history significant for treatment for major depressive disorder, GDM-A2, GBS positive, and h/o HSV. Patient BMI was 50 at last OB visit and EFW 7.5lbs at 35wks. Patient reports that she took her floxentine and valtrex this morning and her glyburide last night.   Patient Active Problem List   Diagnosis Date Noted  . Hyperlipidemia 12/16/2014  . Gestational diabetes mellitus (GDM) treated with oral hypoglycemic therapy 12/16/2014  . Hx of herpes simplex infection 11/29/2014  . Major depressive disorder, recurrent, severe without psychotic features ([place]) 09/23/2014  . GAD (generalized anxiety disorder) 09/23/2014  . Social anxiety disorder 09/23/2014  . Routine general medical examination at a health care facility 01/07/2014  . Acute sinus infection 12/02/2012  . Upper airway resistance syndrome 11/26/2011  . Obesity 05/17/2011  . Right knee pain 02/16/2011  . Depression 02/16/2011  . Hx gestational diabetes 02/16/2011  . Eczema 02/16/2011    History of present pregnancy: Patient entered care at 8.4 weeks.  EDC of 12/25/2014 was established by Definite LMP of 03/20/2014.  Anatomy scan: 19.2 weeks, with normal findings and an anterior placenta.  Additional [place] evaluations: 9.2wks: U/S reviewed: SIUP, +FHT  19.2wks: [place]: SIUP, NORMAL FLUID, NORMAL ANATOMY BUT NOT ALL ANATOMY SEEN, FEMALE, CX 3.79 CM, GROWTH 97%. 24.3wks: FU ANATOMY TODAY ALL WNL 31.3wks: GDM: growth U/S reviewed: EFW 2668g (90%), AFI 19.03 (70%), good interval growth , BPP 8/8. 32.5wks: U/S:Singleton pregnancy. Vertex presentation. Anterior placenta. Fluid is normal-AFI=55th%. Cervix not seen per protocol. BPP 8/8 in 15 minutes. 34.2wks: EFW 82% WITH AC 98%, AFI 16.  35.2wks: BPP 8/8 TODAY, EFW 7+5, 96%ILE, AFI 16.48, 60%ILE, VTX. 38.2wks: U/S: Singleton pregnancy. Vertex  presentation. Posterior placenta. AFI is normal-70th%. BPP=8/8 in 4 mins Significant prenatal events: 1st Trimester: Reports of fatigue and depression, but denies feelings of SI or HI.  Zoloft modified to prozac.  Patient also c/o sharp abdominal pain-regarded as gas.  2nd Trimester: Panorama requested, AFP performed.  Medication changed to fluoxetine.  Started attending sessions at [facility] clinic for emotional distress.  Patient with c/o cramping-dx as round ligament pain. Patient c/o lower back pain-regarded as sciatica. Given influenza and tdap vaccine.   3rd Trimester: Reports increased swelling and frequency of urination.  BS not controlled, started on glyburide.  BPP initiated at 31wks. Glyburide increased from 5mg  to 7.5 (2.5mg  in pm).  Instructed to stop fluoxentine 4 days prior to c/s, then resart in PPP. Dr. Charlesetta Garibaldi instructed patient to continue fluoxentine due to severity of symptoms throughout pregnancy.  Last evaluation: In office on 12/13/2014 by A. Roberts at 38.2wks: FHR 136, BP 114/72, Wt 282lbs  OB History    Gravida Para Term Preterm AB TAB SAB Ectopic Multiple Living   3 2        2      Past Medical History  Diagnosis Date  . Depression   . Cluster headaches   . GERD (gastroesophageal reflux disease)   . Hyperlipidemia   . Hypertension   . Snoring     sleep study 02/2012 with min AHI events  . Gestational diabetes 2012  . Pregnant and not yet delivered in second trimester   . Anxiety   . Eczema   . Sciatica    Past Surgical History  Procedure Laterality Date  . Ganglion removal from (l) wrist  1998  . Cesarean section  09 & 11  x's 2  . Wisdom tooth extraction    . Colposcopy    . Dental surgery     Family History: family history includes Alcohol abuse in her father, mother, and other; Anxiety disorder in her mother and sister; Arthritis in her father, mother, and other; Breast cancer in her paternal aunt; Cancer (age of onset: 99) in her mother; Coronary artery  disease (age of onset: 60) in her mother; Depression in her mother; Diabetes in her maternal grandmother; Drug abuse in her father; Heart disease in her father and maternal grandmother; Hyperlipidemia in her father and paternal grandfather; Hypertension in her paternal grandfather; Kidney disease in her father; Mental illness in her father and mother; Stroke in her father and maternal grandfather. Social History:  reports that she quit smoking about 7 months ago. Her smoking use included Cigarettes. She has a 4.95 pack-year smoking history. She has never used smokeless tobacco. She reports that she does not drink alcohol or use illicit drugs.   Prenatal Transfer Tool  Maternal Diabetes: Yes:  Diabetes Type:  Insulin/Medication controlled Genetic Screening: Normal Maternal Ultrasounds/Referrals: Normal Fetal Ultrasounds or other Referrals:  Fetal echo Maternal Substance Abuse:  No Significant Maternal Medications:  Meds include: Other:  Glyburide 7.5mg , Fluoxentine 20mg  Significant Maternal Lab Results: Lab values include: Group B Strep positive    ROS:  +FM, -Ctx, -LoF, -VB  Allergies  Allergen Reactions  . Codeine     STOMACH CRAMPING  . Doxycycline Nausea And Vomiting       Blood pressure 133/81, pulse 77, temperature 98.9 F (37.2 C), temperature source Oral, resp. rate 18.  Physical Exam  Vitals reviewed. Constitutional: She is oriented to person, place, and time. She appears well-developed and well-nourished.  HENT:  Head: Normocephalic and atraumatic.  Eyes: EOM are normal.  Neck: Normal range of motion.  Cardiovascular: Normal rate, regular rhythm and normal heart sounds.   Respiratory: Effort normal and breath sounds normal.  GI: Soft. Bowel sounds are normal.  Gravid--fundal height appears LGA, Soft, NT   Musculoskeletal: Normal range of motion.  Neurological: She is alert and oriented to person, place, and time.  Skin: Skin is warm and dry.    FHR: 120 bpm, Mod  Var, -Decels, +Accels UCs:  Occassional  Prenatal labs: ABO, Rh: --/--/O POS (12/09 1215) Antibody: NEG (12/09 1215) Rubella:  Immune RPR: Non Reactive (12/09 1217)  HBsAg: Negative (04/25 0000)  HIV: Non-reactive (09/20 0000)  GBS:  Positive Sickle cell/Hgb electrophoresis: Normal Pap: Normal-2015 GC: Negative Chlamydia: Negative Other: TSH Normal, Vit D 28  Assessment IUP at 39wks Cat I FT Repeat Cesarean Section Desires Permanent Sterilization Major Depressive Disorder Obesity-BMI 50 GDM-A2 Hyperlipidemia   Plan: Admit to [facility] per consult with Dr. Delane Ginger. Dillard Routine Orders Pre-Op Obtain BS-Initial 19 Per Dr. Delane Ginger. Dillard start D5LR at 142mL Continue Fluoxentine 20mg  in [place], MSN 12/18/2014, 11:08 AM

## 2014-12-18 NOTE — Op Note (Signed)
Cesarean Section Procedure Note   [name]  12/18/2014  Indications: Scheduled Proceedure/Maternal Request   Pre-operative Diagnosis: Prior Cesarean Section;DESIRE STERILIZATION.   Post-operative Diagnosis: Same   Surgeon Ellice Boultinghouse  Assistants: Milinda Cave CNM  Anesthesia: spinal   Procedure Details:  The patient was seen in the Holding Room. The risks, benefits, complications, treatment options, and expected outcomes were discussed with the patient. The patient concurred with the proposed plan, giving informed consent. identified as [name] and the procedure verified as C-Section Delivery. A Time Out was held and the above information confirmed.  After induction of anesthesia, the patient was draped and prepped in the usual sterile manner. A transverse incision was made and carried down through the subcutaneous tissue to the fascia. Fascial incision was made and extended transversely. The fascia was separated from the underlying rectus tissue superiorly and inferiorly. The peritoneum was identified and entered. Peritoneal incision was extended longitudinally. The utero-vesical peritoneal reflection was incised transversely and the bladder flap was bluntly freed from the lower uterine segment.there were dense adhesions that were lysed from the bladder to the anterior abdominal wall and uterus.   A low transverse uterine incision was made. Delivered from cephalic presentation was a  pound Living newborn infant(s) or Female with Apgar scores of 7 at one minute and 8 at five minutes. Cord ph was not sent the umbilical cord was clamped and cut cord blood was obtained for evaluation. The placenta was removed Intact and appeared normal. The uterine outline, tubes and ovaries appeared normal}. The uterine incision was closed with running locked sutures of 0Vicryl. A second layer of 0 vicryl was used to imbricate the uterus.  The patients left fallopian tube could not be visualized.  The  patients right fallopian tube was followed out to the fimbriated end and excised.  It  was sent to pathology.     Hemostasis was observed. Lavage was carried out until clear. The fascia was then reapproximated with running sutures of 0Vicryl. The subcuticular closure was performed using 2-0plain gut. The skin was closed with 4-81monocryl.   Instrument, sponge, and needle counts were correct prior the abdominal closure and were correct at the conclusion of the case.    Findings:vtx with clear fluid.  Dense adhsions from the uterus to the anterior abdominal wall . I was unable to see the left tube  Estimated Blood Loss: 1487ml  Total IV Fluids: 2734ml   Urine Output: 100CC OF clear urine  Specimens: placenta to path Complications: no complications  Disposition: PACU - hemodynamically stable.   Maternal Condition: stable   Baby condition / location:  Couplet care / Skin to Skin  Attending Attestation: I performed the procedure.   Signed: Surgeon(s): Crawford Givens, MD

## 2014-12-19 ENCOUNTER — Encounter (HOSPITAL_COMMUNITY): Payer: Self-pay | Admitting: Obstetrics and Gynecology

## 2014-12-19 LAB — GLUCOSE, CAPILLARY
GLUCOSE-CAPILLARY: 111 mg/dL — AB (ref 65–99)
GLUCOSE-CAPILLARY: 128 mg/dL — AB (ref 65–99)
GLUCOSE-CAPILLARY: 119 mg/dL — AB (ref 65–99)
GLUCOSE-CAPILLARY: 180 mg/dL — AB (ref 65–99)
Glucose-Capillary: 67 mg/dL (ref 65–99)

## 2014-12-19 LAB — CBC
HCT: 29.6 % — ABNORMAL LOW (ref 36.0–46.0)
Hemoglobin: 9.8 g/dL — ABNORMAL LOW (ref 12.0–15.0)
MCH: 30.3 pg (ref 26.0–34.0)
MCHC: 33.1 g/dL (ref 30.0–36.0)
MCV: 91.6 fL (ref 78.0–100.0)
PLATELETS: 152 10*3/uL (ref 150–400)
RBC: 3.23 MIL/uL — AB (ref 3.87–5.11)
RDW: 14.9 % (ref 11.5–15.5)
WBC: 11.2 10*3/uL — AB (ref 4.0–10.5)

## 2014-12-19 NOTE — Anesthesia Postprocedure Evaluation (Signed)
Anesthesia Post Note  Patient: [name]  Procedure(s) Performed: Procedure(s) (LRB): REPEAT CESAREAN SECTION WITH BILATERAL TUBAL LIGATION (Bilateral)  Patient location during evaluation: Mother Baby Anesthesia Type: Combined Spinal/Epidural Level of consciousness: awake and alert and oriented Pain management: pain level controlled Vital Signs Assessment: post-procedure vital signs reviewed and stable Respiratory status: respiratory function stable Cardiovascular status: blood pressure returned to baseline Postop Assessment: no headache, no backache, patient able to bend at knees, no signs of nausea or vomiting and adequate PO intake Anesthetic complications: no    Last Vitals:  Filed Vitals:   12/19/14 0341 12/19/14 0740  BP: 114/52 105/62  Pulse: 70 64  Temp: 37.1 C 37.1 C  Resp: 20 18    Last Pain:  Filed Vitals:   12/19/14 0854  PainSc: 0-No pain                 Sheronda Parran

## 2014-12-19 NOTE — Progress Notes (Signed)
Acknowledged order for social work consult regarding mother's hx depression and anxiety * Referral screened out by Clinical Social Worker because patient is currently being treated.  * Patient's symptoms currently being treated with Prozac  CSW completed chart review and spoke with MOB's nurse.  RN reported no social concerns at this time.    Please contact the Clinical Social Worker if needs arise, or by the patient's request.

## 2014-12-19 NOTE — Progress Notes (Addendum)
[name] [id number] Postpartum Day 1 S/P Scheduled Repeat Cesarean Section with Right Salpingectomy   Subjective: Patient up ad lib, denies syncope or dizziness. Reports consuming regular diet without issues and denies N/V. Patient reports no bowel movement and is not passing flatus.  Denies issues with urination and reports bleeding is "good."  Patient is bottlefeeding and reports going well.  Pain is being appropriately managed with use of percocet. Patient denies feelings of SI and HI.  Objective: Temp:  [98 F (36.7 C)-99.6 F (37.6 C)] 98.7 F (37.1 C) (12/11 0740) Pulse Rate:  [64-89] 64 (12/11 0740) Resp:  [14-24] 18 (12/11 0740) BP: (105-153)/(52-92) 105/62 mmHg (12/11 0740) SpO2:  [96 %-99 %] 98 % (12/11 0740)   Recent Labs  12/17/14 1217 12/19/14 0510  HGB 11.8* 9.8*  HCT 35.9* 29.6*  WBC 7.7 11.2*    Physical Exam:  General: alert, cooperative and no distress Mood/Affect: Appropriate/Appropriate Lungs: clear to auscultation, no wheezes, rales or rhonchi, symmetric air entry.  Heart: normal rate and regular rhythm. Breast: not examined. Abdomen:  - bowel sounds, +Moderate Distention Incision:  Honeycomb dressing in place Uterine Fundus: firm Lochia: appropriate Skin: Warm, Dry. DVT Evaluation: No evidence of DVT seen on physical exam. JP drain:   None  Assessment Post Operative Day 1 S/P Repeat C/S Right Salpingectomy Normal Involution BottleFeeding Depression Hemodynamically Stable  Plan: -Follow up in office in 2 weeks for depression assessment -Continue other mgmt as ordered -Dr. Wilma Flavin to be updated on patient status  Maryann Conners MSN, CNM 12/19/2014, 9:34 AM   I saw and examined patient and agree with above findings, assessment and plan.  Patient desires circumcision of neonate.  We discussed risks, benefits and alternatives of circumcision including risks to the baby of bleeding, infection, damage to organs.  All questions answered.   Consent signed.  Dr. Alesia Richards.

## 2014-12-19 NOTE — Addendum Note (Signed)
Addendum  created 12/19/14 0947 by Riki Sheer, CRNA   Modules edited: Clinical Notes   Clinical Notes:  File: [id number]

## 2014-12-20 ENCOUNTER — Encounter (HOSPITAL_COMMUNITY): Payer: Self-pay | Admitting: Obstetrics and Gynecology

## 2014-12-20 LAB — GLUCOSE, CAPILLARY
Glucose-Capillary: 83 mg/dL (ref 65–99)
Glucose-Capillary: 122 mg/dL — ABNORMAL HIGH (ref 65–99)
Glucose-Capillary: 142 mg/dL — ABNORMAL HIGH (ref 65–99)

## 2014-12-20 NOTE — Progress Notes (Signed)
Subjective: Postpartum Day 2: Cesarean Delivery due to repeat with RTL Patient up ad lib, reports no syncope or dizziness. Feeding:  bottle Contraceptive plan:  Anticipated BTL but left tube could not be visualized.  Infant in NICE, pt reports d/t "withdrawl"    Objective: Vital signs in last 24 hours: Temp:  [98 F (36.7 C)-98.3 F (36.8 C)] 98 F (36.7 C) (12/12 0655) Pulse Rate:  [78-90] 78 (12/12 0655) Resp:  [18-20] 18 (12/12 0655) BP: (115-135)/(52-66) 115/53 mmHg (12/12 0655) SpO2:  [96 %-97 %] 96 % (12/11 2015)  Physical Exam:  General: alert, cooperative and flat affect Lochia: appropriate Uterine Fundus: firm Abdomen:  + bowel sounds, non distended Incision: no significant drainage  Honeycomb dressing CDI DVT Evaluation: No evidence of DVT seen on physical exam. Homan's sign: Negative   Recent Labs  12/17/14 1217 12/19/14 0510  HGB 11.8* 9.8*  HCT 35.9* 29.6*  WBC 7.7 11.2*    Assessment: Status post Cesarean section day 2. Doing well postoperatively.  Pressure dressing in place, no significant drainage Anemia - hemodynamicly stable.  Circumcision: in patient Hx depression on prozac 40mg  QD,  SW consult   Plan: Continue current care. Plan for discharge tomorrow Remove outer dressing prior to DC 2 week mental health check  Iron supplements  Tisheena Maguire, CNM, MSN 12/20/2014. 8:20 AM

## 2014-12-20 NOTE — Progress Notes (Addendum)
Infant transferred to NICU due to increasing NAS scores. Neo discussed plan of care with mother.

## 2014-12-20 NOTE — Lactation Note (Signed)
This note was copied from the chart of [place]. Lactation Consultation Note [place] visited with mom at 73 hours of age, Randel Books is in NICU.  Mom reports baby is having withdraw from prozac and she does not plan to provide breastmilk.  [place] asked if neo has requested mom to provide breast milk to wean baby from exposure through breastmilk.  Mom reports neo has not and she doesn't want baby to be exposed to prozac any longer.  LC supported mom in her decision and discussed milk coming to volume and decreasing stimulation to breasts.    Patient Name: [name] [id number] Date: 12/20/2014     Maternal Data    Feeding Feeding Type: Formula Nipple Type: Slow - flow Length of feed: 15 min  LATCH Score/Interventions                      Lactation Tools Discussed/Used     Consult Status      Shoptaw, Justine Null 12/20/2014, 7:57 PM

## 2014-12-20 NOTE — Progress Notes (Signed)
Patient stated that NICU told her that the baby may stay only 2 more days. She requested that she would like to stay until the baby is discharged.  I stated that her physician would assess when she was ready for discharge. Patient stated understanding.

## 2014-12-20 NOTE — Progress Notes (Signed)
Mother in NICU to visit baby at this time.

## 2014-12-21 LAB — GLUCOSE, CAPILLARY
GLUCOSE-CAPILLARY: 75 mg/dL (ref 65–99)
GLUCOSE-CAPILLARY: 112 mg/dL — AB (ref 65–99)
GLUCOSE-CAPILLARY: 136 mg/dL — AB (ref 65–99)
Glucose-Capillary: 118 mg/dL — ABNORMAL HIGH (ref 65–99)

## 2014-12-21 LAB — COMPREHENSIVE METABOLIC PANEL
ALBUMIN: 2.6 g/dL — AB (ref 3.5–5.0)
ALK PHOS: 110 U/L (ref 38–126)
ALT: 23 U/L (ref 14–54)
ANION GAP: 7 (ref 5–15)
AST: 21 U/L (ref 15–41)
BUN: 12 mg/dL (ref 6–20)
CALCIUM: 9 mg/dL (ref 8.9–10.3)
CHLORIDE: 106 mmol/L (ref 101–111)
CO2: 26 mmol/L (ref 22–32)
CREATININE: 0.64 mg/dL (ref 0.44–1.00)
GFR calc non Af Amer: >60 mL/min (ref >60)
GLUCOSE: 85 mg/dL (ref 65–99)
Potassium: 4.2 mmol/L (ref 3.5–5.1)
SODIUM: 139 mmol/L (ref 135–145)
Total Bilirubin: 0.3 mg/dL (ref 0.3–1.2)
Total Protein: 5.7 g/dL — ABNORMAL LOW (ref 6.5–8.1)

## 2014-12-21 LAB — TYPE AND SCREEN
ABO/RH(D): O POS
ANTIBODY SCREEN: NEGATIVE
Unit division: 0
Unit division: 0

## 2014-12-21 LAB — LACTATE DEHYDROGENASE: LDH: 168 U/L (ref 98–192)

## 2014-12-21 LAB — CBC
HCT: 30.7 % — ABNORMAL LOW (ref 36.0–46.0)
HEMOGLOBIN: 9.9 g/dL — AB (ref 12.0–15.0)
MCH: 30.6 pg (ref 26.0–34.0)
MCHC: 32.2 g/dL (ref 30.0–36.0)
MCV: 94.8 fL (ref 78.0–100.0)
PLATELETS: 161 10*3/uL (ref 150–400)
RBC: 3.24 MIL/uL — AB (ref 3.87–5.11)
RDW: 15.7 % — ABNORMAL HIGH (ref 11.5–15.5)
WBC: 11.1 10*3/uL — ABNORMAL HIGH (ref 4.0–10.5)

## 2014-12-21 LAB — PROTEIN / CREATININE RATIO, URINE
CREATININE, URINE: 212 mg/dL
Protein Creatinine Ratio: 0.08 mg/mg{Cre} (ref 0.00–0.15)
TOTAL PROTEIN, URINE: 16 mg/dL

## 2014-12-21 LAB — URIC ACID: URIC ACID, SERUM: 3.4 mg/dL (ref 2.3–6.6)

## 2014-12-21 MED ORDER — HYDROCHLOROTHIAZIDE 25 MG PO TABS
25.0000 mg | ORAL_TABLET | Freq: Every day | ORAL | Status: DC
  Administered 2014-12-21 – 2014-12-22 (×2): 25 mg via ORAL
  Filled 2014-12-21 (×3): qty 1

## 2014-12-21 NOTE — Progress Notes (Addendum)
Rn left message for Midwife Standard regarding patient's blood pressure of 150/69 which was after returning from NICU, patient was in pain. Later , Rn took  patient's  blood pressure again, patient's blood pressure  was 129/78.

## 2014-12-21 NOTE — Clinical Documentation Improvement (Signed)
OB/GYN  Abnormal Lab/Test Results:   Component     Latest Ref Rng 12/17/2014 12/19/2014 12/21/2014  RBC     3.87 - 5.11 MIL/uL 3.95 3.23 (L) 3.24 (L)  Hemoglobin     12.0 - 15.0 g/dL 11.8 (L) 9.8 (L) 9.9 (L)  HCT     36.0 - 46.0 % 35.9 (L) 29.6 (L) 30.7 (L)    Possible Clinical Conditions associated with below indicators > Acute blood loss anemia > Iron Deficiency   Other Condition  Cannot Clinically Determine   Supporting Information:  12/20/14: Anemia - hemodynamicly stable.   Treatment Provided:  NOTED 12/20/14: "Iron supplements" per d/c plan    Please exercise your independent, professional judgment when responding. A specific answer is not anticipated or expected. Please update your documentation within the medical record to reflect your response to this query. Thank you  Thank You,  [place] [phone]

## 2014-12-21 NOTE — Progress Notes (Signed)
[name] [id number]  Subjective: Postpartum Day 3: Repeat LTC/S, scheduled. Patient in NICU, visiting baby.  Baby being evaluated for NAS, started on MSO4 last night.  NICU staff anticipates several more days in NICU. Patient up ad lib, reports no syncope or dizziness.  Concerned about new onset of generalized swelling and BP.   Denies HA, visual sx, or epigastric pain. Feeding:  Bottlefeeding, due to maternal use of Prozac Contraceptive plan:  BTL done at time of C/S, with ligation of right fallopian tube.  Left tube unable to be visualized due to dense adhesions.  Objective: Temp:  [98 F (36.7 C)-98.2 F (36.8 C)] 98 F (36.7 C) (12/13 0550) Pulse Rate:  [69-102] 69 (12/13 0550) Resp:  [20] 20 (12/13 0550) BP: (129-150)/(69-91) 146/91 mmHg (12/13 0550)   Weight pending from today ((286.6 on 12/17/14)  Filed Vitals:   12/20/14 0655 12/20/14 1947 12/20/14 2205 12/21/14 0550  BP: 115/53 150/69 129/78 146/91  Pulse: 78 102 89 69  Temp: 98 F (36.7 C) 98.2 F (36.8 C)  98 F (36.7 C)  TempSrc:  Oral  Oral  Resp: 18 20 20 20   SpO2:        CBC Latest Ref Rng 12/21/2014 12/19/2014 12/17/2014  WBC 4.0 - 10.5 K/uL 11.1(H) 11.2(H) 7.7  Hemoglobin 12.0 - 15.0 g/dL 9.9(L) 9.8(L) 11.8(L)  Hematocrit 36.0 - 46.0 % 30.7(L) 29.6(L) 35.9(L)  Platelets 150 - 400 K/uL 161 152 152   CMP Latest Ref Rng 12/21/2014 12/17/2014 09/12/2014  Glucose 65 - 99 mg/dL 85 68 90  BUN 6 - 20 mg/dL 12 10 6   Creatinine 0.44 - 1.00 mg/dL 0.64 0.68 0.63  Sodium 135 - 145 mmol/L 139 131(L) 133(L)  Potassium 3.5 - 5.1 mmol/L 4.2 3.8 3.6  Chloride 101 - 111 mmol/L 106 101 104  CO2 22 - 32 mmol/L 26 21(L) 19(L)  Calcium 8.9 - 10.3 mg/dL 9.0 9.0 8.7(L)  Total Protein 6.5 - 8.1 g/dL 5.7(L) - -  Total Bilirubin 0.3 - 1.2 mg/dL 0.3 - -  Alkaline Phos 38 - 126 U/L 110 - -  AST 15 - 41 U/L 21 - -  ALT 14 - 54 U/L 23 - -     Physical Exam:  General: alert and mildly anxious Lochia: appropriate Uterine  Fundus: firm Abdomen:  + bowel sounds Incision: Honeycomb dressing CDI DVT Evaluation: No evidence of DVT seen on physical exam. Calf/Ankle edema is present, 2+ pitting edema. DTR 1+, no clonus   Assessment: Status post cesarean delivery, day 3. Elevation of BP--no evidence pre-eclampsia Pedal edema  Plan: Continue current care. Start HCTZ 25 mg today, for 7 day course Daily weights Reviewed plan for deferral of d/c today to allow for monitoring BP and response to HCTZ. Patient understands we will likely will be d/c'ing her home prior to baby's d/c from NICU.   Donnel Saxon MSN, CNM 12/21/2014, 10:20 AM

## 2014-12-22 LAB — GLUCOSE, CAPILLARY: GLUCOSE-CAPILLARY: 73 mg/dL (ref 65–99)

## 2014-12-22 MED ORDER — HYDROCHLOROTHIAZIDE 25 MG PO TABS: 25.0000 mg | ORAL_TABLET | Freq: Every day | ORAL | Status: DC

## 2014-12-22 MED ORDER — IBUPROFEN 600 MG PO TABS: 600.0000 mg | ORAL_TABLET | Freq: Four times a day (QID) | ORAL | Status: DC | PRN

## 2014-12-22 MED ORDER — OXYCODONE-ACETAMINOPHEN 5-325 MG PO TABS: 1.0000 | ORAL_TABLET | ORAL | Status: DC | PRN

## 2014-12-22 NOTE — Discharge Instructions (Signed)
Postpartum Care After Cesarean Delivery °After you deliver your newborn (postpartum period), the usual stay in the hospital is 24-72 hours. If there were problems with your labor or delivery, or if you have other medical problems, you might be in the hospital longer.  °While you are in the hospital, you will receive help and instructions on how to care for yourself and your newborn during the postpartum period.  °While you are in the hospital: °· It is normal for you to have pain or discomfort from the incision in your abdomen. Be sure to tell your nurses when you are having pain, where the pain is located, and what makes the pain worse. °· If you are breastfeeding, you may feel uncomfortable contractions of your uterus for a couple of weeks. This is normal. The contractions help your uterus get back to normal size. °· It is normal to have some bleeding after delivery. °¨ For the first 1-3 days after delivery, the flow is red and the amount may be similar to a period. °¨ It is common for the flow to start and stop. °¨ In the first few days, you may pass some small clots. Let your nurses know if you begin to pass large clots or your flow increases. °¨ Do not  flush blood clots down the toilet before having the nurse look at them. °¨ During the next 3-10 days after delivery, your flow should become more watery and pink or brown-tinged in color. °¨ Ten to fourteen days after delivery, your flow should be a small amount of yellowish-white discharge. °¨ The amount of your flow will decrease over the first few weeks after delivery. Your flow may stop in 6-8 weeks. Most women have had their flow stop by 12 weeks after delivery. °· You should change your sanitary pads frequently. °· Wash your hands thoroughly with soap and water for at least 20 seconds after changing pads, using the toilet, or before holding or feeding your newborn. °· Your intravenous (IV) tubing will be removed when you are drinking enough fluids. °· The  urine drainage tube (urinary catheter) that was inserted before delivery may be removed within 6-8 hours after delivery or when feeling returns to your legs. You should feel like you need to empty your bladder within the first 6-8 hours after the catheter has been removed. °· In case you become weak, lightheaded, or faint, call your nurse before you get out of bed for the first time and before you take a shower for the first time. °· Within the first few days after delivery, your breasts may begin to feel tender and full. This is called engorgement. Breast tenderness usually goes away within 48-72 hours after engorgement occurs. You may also notice milk leaking from your breasts. If you are not breastfeeding, do not stimulate your breasts. Breast stimulation can make your breasts produce more milk. °· Spending as much time as possible with your newborn is very important. During this time, you and your newborn can feel close and get to know each other. Having your newborn stay in your room (rooming in) will help to strengthen the bond with your newborn. It will give you time to get to know your newborn and become comfortable caring for your newborn. °· Your hormones change after delivery. Sometimes the hormone changes can temporarily cause you to feel sad or tearful. These feelings should not last more than a few days. If these feelings last longer than that, you should talk to your   caregiver.  If desired, talk to your caregiver about methods of family planning or contraception.  Talk to your caregiver about immunizations. Your caregiver may want you to have the following immunizations before leaving the hospital:  Tetanus, diphtheria, and pertussis (Tdap) or tetanus and diphtheria (Td) immunization. It is very important that you and your family (including grandparents) or others caring for your newborn are up-to-date with the Tdap or Td immunizations. The Tdap or Td immunization can help protect your newborn  from getting ill.  Rubella immunization.  Varicella (chickenpox) immunization.  Influenza immunization. You should receive this annual immunization if you did not receive the immunization during your pregnancy.   This information is not intended to replace advice given to you by your health care provider. Make sure you discuss any questions you have with your health care provider.   Document Released: 09/19/2011 Document Reviewed: 09/19/2011 Elsevier Interactive Patient Education 2016 [employer]. Hydrochlorothiazide, HCTZ capsules or tablets What is this medicine? HYDROCHLOROTHIAZIDE (hye droe klor oh THYE a zide) is a diuretic. It increases the amount of urine passed, which causes the body to lose salt and water. This medicine is used to treat high blood pressure. It is also reduces the swelling and water retention caused by various medical conditions, such as heart, liver, or kidney disease. This medicine may be used for other purposes; ask your health care provider or pharmacist if you have questions. What should I tell my health care provider before I take this medicine? They need to know if you have any of these conditions: -diabetes -gout -immune system problems, like lupus -kidney disease or kidney stones -liver disease -pancreatitis -small amount of urine or difficulty passing urine -an unusual or allergic reaction to hydrochlorothiazide, sulfa drugs, other medicines, foods, dyes, or preservatives -pregnant or trying to get pregnant -breast-feeding How should I use this medicine? Take this medicine by mouth with a glass of water. Follow the directions on the prescription label. Take your medicine at regular intervals. Remember that you will need to pass urine frequently after taking this medicine. Do not take your doses at a time of day that will cause you problems. Do not stop taking your medicine unless your doctor tells you to. Talk to your pediatrician regarding the use  of this medicine in children. Special care may be needed. Overdosage: If you think you have taken too much of this medicine contact a poison control center or emergency room at once. NOTE: This medicine is only for you. Do not share this medicine with others. What if I miss a dose? If you miss a dose, take it as soon as you can. If it is almost time for your next dose, take only that dose. Do not take double or extra doses. What may interact with this medicine? -cholestyramine -colestipol -digoxin -dofetilide -lithium -medicines for blood pressure -medicines for diabetes -medicines that relax muscles for surgery -other diuretics -steroid medicines like prednisone or cortisone This list may not describe all possible interactions. Give your health care provider a list of all the medicines, herbs, non-prescription drugs, or dietary supplements you use. Also tell them if you smoke, drink alcohol, or use illegal drugs. Some items may interact with your medicine. What should I watch for while using this medicine? Visit your doctor or health care professional for regular checks on your progress. Check your blood pressure as directed. Ask your doctor or health care professional what your blood pressure should be and when you should contact him or  her. Dennis Bast may need to be on a special diet while taking this medicine. Ask your doctor. Check with your doctor or health care professional if you get an attack of severe diarrhea, nausea and vomiting, or if you sweat a lot. The loss of too much body fluid can make it dangerous for you to take this medicine. You may get drowsy or dizzy. Do not drive, use machinery, or do anything that needs mental alertness until you know how this medicine affects you. Do not stand or sit up quickly, especially if you are an older patient. This reduces the risk of dizzy or fainting spells. Alcohol may interfere with the effect of this medicine. Avoid alcoholic drinks. This  medicine may affect your blood sugar level. If you have diabetes, check with your doctor or health care professional before changing the dose of your diabetic medicine. This medicine can make you more sensitive to the sun. Keep out of the sun. If you cannot avoid being in the sun, wear protective clothing and use sunscreen. Do not use sun lamps or tanning beds/booths. What side effects may I notice from receiving this medicine? Side effects that you should report to your doctor or health care professional as soon as possible: -allergic reactions such as skin rash or itching, hives, swelling of the lips, mouth, tongue, or throat -changes in vision -chest pain -eye pain -fast or irregular heartbeat -feeling faint or lightheaded, falls -gout attack -muscle pain or cramps -pain or difficulty when passing urine -pain, tingling, numbness in the hands or feet -redness, blistering, peeling or loosening of the skin, including inside the mouth -unusually weak or tired Side effects that usually do not require medical attention (report to your doctor or health care professional if they continue or are bothersome): -change in sex drive or performance -dry mouth -headache -stomach upset This list may not describe all possible side effects. Call your doctor for medical advice about side effects. You may report side effects to FDA at 1-800-FDA-1088. Where should I keep my medicine? Keep out of the reach of children. Store at room temperature between 15 and 30 degrees C (59 and 86 degrees F). Do not freeze. Protect from light and moisture. Keep container closed tightly. Throw away any unused medicine after the expiration date. NOTE: This sheet is a summary. It may not cover all possible information. If you have questions about this medicine, talk to your doctor, pharmacist, or health care provider.    2016, Elsevier/Gold Standard. (2009-08-19 12:57:37)

## 2014-12-22 NOTE — Discharge Summary (Signed)
OB Discharge Summary     Patient Name: [name] DOB: 06-Jan-1981 MRN: [id number]  Date of admission: 12/18/2014 Delivering MD: Crawford Givens   Date of discharge: 12/22/2014  Admitting diagnosis: SCHEDULED CSECTION Prior Cesarean Section;DESIRE STERILIZATION Intrauterine pregnancy: [redacted]w[redacted]d     Secondary diagnosis:  Active Problems:   S/P C-section  Additional problems:      Discharge diagnosis: Term Pregnancy Delivered                                                                                                Post partum procedures:none  Augmentation: N/A  Complications: None  Hospital course:  Sceduled C/S   34 y.o. yo G3P1003 at [redacted]w[redacted]d was admitted to the hospital 12/18/2014 for scheduled cesarean section with the following indication:Repeat Cesarean and tubal ligation.  Membrane Rupture Time/Date: 4:34 PM ,12/18/2014   Patient delivered a Viable infant.12/18/2014  Details of operation can be found in separate operative note.  Pateint had an uncomplicated postpartum course.  She is ambulating, tolerating a regular diet, passing flatus, and urinating well. Patient is discharged home in stable condition on No discharge date for patient encounter.           Physical exam  Filed Vitals:   12/21/14 1100 12/21/14 1130 12/21/14 1852 12/22/14 0702  BP:  131/79 130/80 127/83  Pulse:  79 90 71  Temp:   98.2 F (36.8 C) 97.9 F (36.6 C)  TempSrc:   Oral Oral  Resp:  20 20 18   Weight: 127.914 kg (282 lb)   127.007 kg (280 lb)  SpO2:       General: alert and cooperative Lochia: appropriate Uterine Fundus: firm Incision: Healing well with no significant drainage DVT Evaluation: No evidence of DVT seen on physical exam. Negative Homan's sign. Labs: Lab Results  Component Value Date   WBC 11.1* 12/21/2014   HGB 9.9* 12/21/2014   HCT 30.7* 12/21/2014   MCV 94.8 12/21/2014   PLT 161 12/21/2014   CMP Latest Ref Rng 12/21/2014  Glucose 65 - 99 mg/dL 85  BUN 6 -  20 mg/dL 12  Creatinine 0.44 - 1.00 mg/dL 0.64  Sodium 135 - 145 mmol/L 139  Potassium 3.5 - 5.1 mmol/L 4.2  Chloride 101 - 111 mmol/L 106  CO2 22 - 32 mmol/L 26  Calcium 8.9 - 10.3 mg/dL 9.0  Total Protein 6.5 - 8.1 g/dL 5.7(L)  Total Bilirubin 0.3 - 1.2 mg/dL 0.3  Alkaline Phos 38 - 126 U/L 110  AST 15 - 41 U/L 21  ALT 14 - 54 U/L 23    Discharge instruction: per After Visit Summary and "Baby and Me Booklet".  After visit meds:    Medication List    STOP taking these medications        glyBURIDE 5 MG tablet  Commonly known as:  DIABETA     valACYclovir 1000 MG tablet  Commonly known as:  VALTREX      TAKE these medications        [employer] D3 2000 UNITS Caps  Generic drug:  Cholecalciferol  Take 4,000 Units  by mouth at bedtime.     FLUoxetine 20 MG tablet  Commonly known as:  PROZAC  Take 2 tablets (40 mg total) by mouth daily.     hydrochlorothiazide 25 MG tablet  Commonly known as:  HYDRODIURIL  Take 1 tablet (25 mg total) by mouth daily.     ibuprofen 600 MG tablet  Commonly known as:  ADVIL,MOTRIN  Take 1 tablet (600 mg total) by mouth every 6 (six) hours as needed.     oxyCODONE-acetaminophen 5-325 MG tablet  Commonly known as:  PERCOCET/ROXICET  Take 1 tablet by mouth every 4 (four) hours as needed (for pain scale 4-7).     PNV PRENATAL PLUS MULTIVITAMIN 27-1 MG Tabs  Take 1 tablet by mouth daily.        Diet: routine diet  Activity: Advance as tolerated. Pelvic rest for 6 weeks.   Outpatient follow [id number] schedule 1 week Blood pressure check, 2 week wellness check, and 6 week postpartum check with [facility] Follow up Appt: Future Appointments Date Time Provider [place]  01/04/2015 3:30 PM Nelson Chimes, LPC [facility] None  01/11/2015 3:30 PM Charlcie Cradle, MD [facility] None  01/17/2015 3:30 PM Joslyn Devon Olga Millers, LPC [facility] None  01/31/2015 3:30 PM Joslyn Devon Olga Millers, LPC [facility] None  02/09/2015 9:30 AM Golden Circle, FNP  [facility]   Follow up Visit:No Follow-up on file.  Postpartum contraception: Undecided  Newborn Data: Live born female  Birth Weight: 9 lb 7.5 oz (4295 g) APGAR: 7, 8  Baby Feeding: Bottle Disposition:NICU   12/22/2014 [name], CNM

## 2015-01-04 ENCOUNTER — Ambulatory Visit (INDEPENDENT_AMBULATORY_CARE_PROVIDER_SITE_OTHER): Payer: Federal, State, Local not specified - PPO | Admitting: Psychology

## 2015-01-04 DIAGNOSIS — F330 Major depressive disorder, recurrent, mild: Secondary | ICD-10-CM | POA: Diagnosis not present

## 2015-01-04 DIAGNOSIS — F411 Generalized anxiety disorder: Secondary | ICD-10-CM

## 2015-01-04 NOTE — Progress Notes (Signed)
   THERAPIST PROGRESS NOTE  Session Time: 3.35pm-4.35pm  Participation Level: Active  Behavioral Response: Well GroomedAlertEuthymic  Type of Therapy: Individual Therapy  Treatment Goals addressed: Diagnosis: MDD, GAD and goal 1.  Interventions: CBT and Supportive  Summary: [name] is a 34 y.o. female who presents with full and bright affect.  Pt reported that she delivered her son by csection on 12/25/14 and that he did have withdraws and needed to be in the nicu for a couple of days.  She reported that she did have more pain w/ this delivery as well and had some swelling- staying one more day in hospital as well.  Pt reported that she feels happy and is enjoying interactions w/ her son.  Pt reports that he is having struggles w/ digestion- very gassy, reflux, constipation and they are trying to find best formula for him.  Pt reports that his sleep hasn't been good because of this and so her sleep hasn't been good.  Pt did report feeling more some mild irritable past 2 day.  Pt acknowledged importance of sleep for herself as well and planning this w/ husband to get increased sleep.  Pt discussed father's visit to hospital which was brief and more information learned from brother in law re: questionable activity at his home.  Pt acknowledges that her choice to keep her daughter's from this is for their protection and best for them- not "her fault".   Suicidal/Homicidal: Nowithout intent/plan  Therapist Response: Assessed pt current functioning per her report.  Processed w/pt her mood, interactions w/ newborn and self care postpartum.  Discussed importance of sleep and rest for her self care and for mood stability.  Encouraged how to plan w/ her supports for increased sleep opportunities.  Discussed decision to keep father out of her life and assisted in reframing self blaming thinking.   Plan: Return again in 2 weeks.  Diagnosis: MDD, GAD    [name], LPC 01/04/2015

## 2015-01-11 ENCOUNTER — Encounter (HOSPITAL_COMMUNITY): Payer: Self-pay | Admitting: Psychiatry

## 2015-01-11 ENCOUNTER — Ambulatory Visit (INDEPENDENT_AMBULATORY_CARE_PROVIDER_SITE_OTHER): Payer: Federal, State, Local not specified - PPO | Admitting: Psychiatry

## 2015-01-11 VITALS — BP 123/83 | HR 63 | Resp 12 | Ht 62.0 in | Wt 256.4 lb

## 2015-01-11 DIAGNOSIS — F332 Major depressive disorder, recurrent severe without psychotic features: Secondary | ICD-10-CM | POA: Diagnosis not present

## 2015-01-11 DIAGNOSIS — F4010 Social phobia, unspecified: Secondary | ICD-10-CM | POA: Diagnosis not present

## 2015-01-11 DIAGNOSIS — F411 Generalized anxiety disorder: Secondary | ICD-10-CM

## 2015-01-11 MED ORDER — FLUOXETINE HCL 20 MG PO TABS: 40.0000 mg | ORAL_TABLET | Freq: Every day | ORAL | Status: DC
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
## 2015-01-17 ENCOUNTER — Ambulatory Visit (HOSPITAL_COMMUNITY): Payer: Self-pay | Admitting: Psychology

## 2015-01-21 ENCOUNTER — Other Ambulatory Visit: Payer: Self-pay | Admitting: Obstetrics and Gynecology

## 2015-01-25 ENCOUNTER — Encounter (HOSPITAL_COMMUNITY): Payer: Self-pay

## 2015-01-25 ENCOUNTER — Encounter (HOSPITAL_COMMUNITY)
Admission: RE | Admit: 2015-01-25 | Discharge: 2015-01-25 | Disposition: A | Discharge status: Home / Self Care | Payer: Federal, State, Local not specified - PPO | Source: Ambulatory Visit | Attending: Obstetrics and Gynecology | Admitting: Obstetrics and Gynecology

## 2015-01-25 ENCOUNTER — Other Ambulatory Visit (HOSPITAL_COMMUNITY): Payer: Self-pay | Admitting: Obstetrics and Gynecology

## 2015-01-25 DIAGNOSIS — I10 Essential (primary) hypertension: Secondary | ICD-10-CM | POA: Diagnosis not present

## 2015-01-25 DIAGNOSIS — Z6841 Body Mass Index (BMI) 40.0 and over, adult: Secondary | ICD-10-CM | POA: Diagnosis not present

## 2015-01-25 DIAGNOSIS — K219 Gastro-esophageal reflux disease without esophagitis: Secondary | ICD-10-CM | POA: Diagnosis not present

## 2015-01-25 DIAGNOSIS — Z302 Encounter for sterilization: Secondary | ICD-10-CM | POA: Diagnosis not present

## 2015-01-25 LAB — CBC
HCT: 40.4 % (ref 36.0–46.0)
Hemoglobin: 13.4 g/dL (ref 12.0–15.0)
MCH: 30.3 pg (ref 26.0–34.0)
MCHC: 33.2 g/dL (ref 30.0–36.0)
MCV: 91.4 fL (ref 78.0–100.0)
PLATELETS: 206 10*3/uL (ref 150–400)
RBC: 4.42 MIL/uL (ref 3.87–5.11)
RDW: 13 % (ref 11.5–15.5)
WBC: 8.5 10*3/uL (ref 4.0–10.5)

## 2015-01-25 NOTE — H&P (Signed)
[name] is a 35 y.o. female P:  3-0-0-3 presents for sterilization due to her desire to cease childbearing potential.  During the patient's recent C-section (12/18/14),  her right fallopian tube was removed and the patient desires to have her left tube remove for the sake of permanent sterilization.   Past Medical History  OB History: G: 3;  P:3-0-0-3;   C-section: 2009, 2012 and 2016   GYN History: menarche: 12YP    LMP: 02/25/2014 (delivered 12/18/2014;    Contracepton: none;  Denies history of STDs;   Has a remote history  of abnormal PAP smear-no treatment;   Last PAP smear: May 2016-normal  Medical History: Gestational Diabetes, GERD,  HSV-2, Depression, Anxiety, Migraine, Eczema, Hyperlipidemia, Vitamin D Deficiency, Hypertension and Cervical Disc Herniation  Surgical History: 2016 Right Salpingectomy (during C-section) and 1998 Left Wrist Ganglion Excision Denies problems with anesthesia or history of blood transfusions  Family History: Breast and Lung Cancer, Renal Disease, Thyroid Disease, Hypertension, Stroke, Diabetes Mellitus, Alcoholism and Cardiovascular Disease and  Hypercholesterolemia  Social History:  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 CVS D3 2000 UNITS CAPS Take 4,000 Units by mouth daily.   Marland Kitchen FLUoxetine (PROZAC) 20 MG tablet Take 2 tablets (40 mg total) by mouth daily.  Marland Kitchen ibuprofen (ADVIL,MOTRIN) 600 MG tablet Take 1 tablet (600 mg total) by mouth every 6 (six) hours as needed. (Patient taking differently: Take 600 mg by mouth every 6 (six) hours as needed for mild pain. )   No facility-administered encounter medications on file as of 01/25/2015.   Glyburide 5 mg daily HCTZ  25  mg daily Valacyclovir 1 gram as directed  Allergies  Allergen Reactions  . Codeine     STOMACH CRAMPING  . Doxycycline Nausea And Vomiting   ROS:  Denies headache, vision changes,  nasal congestion, dysphagia, tinnitus, dizziness, hoarseness, cough,  chest pain, shortness of breath, nausea, vomiting, diarrhea,constipation,  urinary frequency, urgency  dysuria, hematuria, vaginitis symptoms, pelvic pain, swelling of joints,easy bruising,  myalgias, arthralgias, skin rashes, unexplained weight loss and except as is mentioned in the history of present illness, patient's review of systems is otherwise negative.   Physical Exam  Bp: 122/80   P: 72  R: 18  Temperature: 99.3 degrees F orally Weight: 257 lbs.  Height: 5'3"  BMI:45.5  Neck: supple without masses or thyromegaly Lungs: clear to auscultation Heart: regular rate and rhythm Abdomen: soft, non-tender and no organomegaly Pelvic:EGBUS- wnl; vagina-normal rugae; uterus-normal size, cervix without lesions or motion tenderness; adnexae-no tenderness or masses Extremities:  no clubbing, cyanosis or edema   Assesment: Desire for Sterilization   Disposition:  A discussion was held with patient regarding the indication for her procedure(s) along with the risks, which include but are not limited to: reaction to anesthesia, damage to adjacent organs, infection and excessive bleeding.  The patient verbalized understanding understanding of these risks and has consented to proceed with Unilateral Salpingectomy for the purpose of permanent sterilization at [facility] on January 26, 2015.   CSN# [id number]   Nimrat Woolworth J. Florene Glen, PA-C  for Dr. Franklyn Lor. Dillard

## 2015-01-25 NOTE — Patient Instructions (Signed)
Your procedure is scheduled on:01/26/15  Enter through the Main Entrance at :1200 noon Pick up desk phone and dial [phone] and inform us of your arrival.  Please call [phone] if you have any problems the morning of surgery.  Remember: Do not eat food after midnight: tonight Clear liquids are ok until:9am   You may brush your teeth the morning of surgery.  Take these meds the morning of surgery with a sip of water:Prozac  DO NOT wear jewelry, eye make-up, lipstick,body lotion, or dark fingernail polish.  (Polished toes are ok) You may wear deodorant.  If you are to be admitted after surgery, leave suitcase in car until your room has been assigned. Patients discharged on the day of surgery will not be allowed to drive home. Wear loose fitting, comfortable clothes for your ride home.

## 2015-01-26 ENCOUNTER — Ambulatory Visit (HOSPITAL_COMMUNITY)
Admission: RE | Admit: 2015-01-26 | Discharge: 2015-01-26 | Disposition: A | Discharge status: Home / Self Care | Payer: Federal, State, Local not specified - PPO | Source: Ambulatory Visit | Attending: Obstetrics and Gynecology | Admitting: Obstetrics and Gynecology

## 2015-01-26 ENCOUNTER — Encounter (HOSPITAL_COMMUNITY): Payer: Self-pay | Admitting: *Deleted

## 2015-01-26 ENCOUNTER — Encounter (HOSPITAL_COMMUNITY)
Admission: RE | Disposition: A | Discharge status: Home / Self Care | Payer: Self-pay | Source: Ambulatory Visit | Attending: Obstetrics and Gynecology

## 2015-01-26 ENCOUNTER — Ambulatory Visit (HOSPITAL_COMMUNITY): Payer: Federal, State, Local not specified - PPO | Admitting: Anesthesiology

## 2015-01-26 DIAGNOSIS — Z6841 Body Mass Index (BMI) 40.0 and over, adult: Secondary | ICD-10-CM | POA: Insufficient documentation

## 2015-01-26 DIAGNOSIS — Z302 Encounter for sterilization: Secondary | ICD-10-CM | POA: Insufficient documentation

## 2015-01-26 DIAGNOSIS — I10 Essential (primary) hypertension: Secondary | ICD-10-CM | POA: Insufficient documentation

## 2015-01-26 DIAGNOSIS — K219 Gastro-esophageal reflux disease without esophagitis: Secondary | ICD-10-CM | POA: Insufficient documentation

## 2015-01-26 HISTORY — PX: LAPAROSCOPIC UNILATERAL SALPINGECTOMY: SHX5934

## 2015-01-26 LAB — PREGNANCY, URINE: PREG TEST UR: NEGATIVE

## 2015-01-26 SURGERY — SALPINGECTOMY, UNILATERAL, LAPAROSCOPIC
Anesthesia: General | Site: Abdomen | Laterality: Left

## 2015-01-26 MED ORDER — PROPOFOL 10 MG/ML IV BOLUS
INTRAVENOUS | Status: AC
  Filled 2015-01-26: qty 20

## 2015-01-26 MED ORDER — FENTANYL CITRATE (PF) 100 MCG/2ML IJ SOLN
25.0000 ug | INTRAMUSCULAR | Status: DC | PRN
  Administered 2015-01-26: 50 ug via INTRAVENOUS

## 2015-01-26 MED ORDER — SCOPOLAMINE 1 MG/3DAYS TD PT72
MEDICATED_PATCH | TRANSDERMAL | Status: AC
  Administered 2015-01-26: 1.5 mg via TRANSDERMAL
  Filled 2015-01-26: qty 1

## 2015-01-26 MED ORDER — OXYCODONE-ACETAMINOPHEN 5-325 MG PO TABS: 1.0000 | ORAL_TABLET | Freq: Four times a day (QID) | ORAL | Status: DC | PRN

## 2015-01-26 MED ORDER — DEXAMETHASONE SODIUM PHOSPHATE 4 MG/ML IJ SOLN
INTRAMUSCULAR | Status: AC
  Filled 2015-01-26: qty 1

## 2015-01-26 MED ORDER — BUPIVACAINE-EPINEPHRINE 0.25% -1:200000 IJ SOLN
INTRAMUSCULAR | Status: DC | PRN
  Administered 2015-01-26: 7 mL
  Administered 2015-01-26: 4 mL

## 2015-01-26 MED ORDER — BUPIVACAINE-EPINEPHRINE (PF) 0.25% -1:200000 IJ SOLN
INTRAMUSCULAR | Status: AC
  Filled 2015-01-26: qty 30

## 2015-01-26 MED ORDER — LIDOCAINE HCL (CARDIAC) 20 MG/ML IV SOLN
INTRAVENOUS | Status: DC | PRN
  Administered 2015-01-26: 80 mg via INTRAVENOUS

## 2015-01-26 MED ORDER — ROCURONIUM BROMIDE 100 MG/10ML IV SOLN
INTRAVENOUS | Status: AC
  Filled 2015-01-26: qty 1

## 2015-01-26 MED ORDER — NEOSTIGMINE METHYLSULFATE 10 MG/10ML IV SOLN
INTRAVENOUS | Status: DC | PRN
  Administered 2015-01-26: 5 mg via INTRAVENOUS

## 2015-01-26 MED ORDER — DEXAMETHASONE SODIUM PHOSPHATE 10 MG/ML IJ SOLN
INTRAMUSCULAR | Status: DC | PRN
  Administered 2015-01-26: 4 mg via INTRAVENOUS

## 2015-01-26 MED ORDER — GLYCOPYRROLATE 0.2 MG/ML IJ SOLN
INTRAMUSCULAR | Status: AC
  Filled 2015-01-26: qty 4

## 2015-01-26 MED ORDER — LIDOCAINE HCL (CARDIAC) 20 MG/ML IV SOLN
INTRAVENOUS | Status: AC
  Filled 2015-01-26: qty 5

## 2015-01-26 MED ORDER — KETOROLAC TROMETHAMINE 30 MG/ML IJ SOLN
INTRAMUSCULAR | Status: DC | PRN
  Administered 2015-01-26: 30 mg via INTRAVENOUS

## 2015-01-26 MED ORDER — IBUPROFEN 600 MG PO TABS: ORAL_TABLET | ORAL | Status: DC

## 2015-01-26 MED ORDER — ONDANSETRON HCL 4 MG/2ML IJ SOLN: 4.0000 mg | Freq: Once | INTRAMUSCULAR | Status: DC | PRN

## 2015-01-26 MED ORDER — MIDAZOLAM HCL 2 MG/2ML IJ SOLN
INTRAMUSCULAR | Status: AC
  Filled 2015-01-26: qty 2

## 2015-01-26 MED ORDER — PROPOFOL 10 MG/ML IV BOLUS
INTRAVENOUS | Status: DC | PRN
  Administered 2015-01-26: 200 mg via INTRAVENOUS

## 2015-01-26 MED ORDER — NEOSTIGMINE METHYLSULFATE 10 MG/10ML IV SOLN
INTRAVENOUS | Status: AC
  Filled 2015-01-26: qty 1

## 2015-01-26 MED ORDER — FENTANYL CITRATE (PF) 250 MCG/5ML IJ SOLN
INTRAMUSCULAR | Status: AC
  Filled 2015-01-26: qty 5

## 2015-01-26 MED ORDER — MIDAZOLAM HCL 2 MG/2ML IJ SOLN
INTRAMUSCULAR | Status: DC | PRN
  Administered 2015-01-26: 2 mg via INTRAVENOUS

## 2015-01-26 MED ORDER — SCOPOLAMINE 1 MG/3DAYS TD PT72
1.0000 | MEDICATED_PATCH | Freq: Once | TRANSDERMAL | Status: DC
  Administered 2015-01-26: 1.5 mg via TRANSDERMAL

## 2015-01-26 MED ORDER — KETOROLAC TROMETHAMINE 30 MG/ML IJ SOLN
INTRAMUSCULAR | Status: AC
  Filled 2015-01-26: qty 1

## 2015-01-26 MED ORDER — LACTATED RINGERS IV SOLN
INTRAVENOUS | Status: DC
Start: 2015-01-26 — End: 2015-01-26
  Administered 2015-01-26: 13:00:00 via INTRAVENOUS

## 2015-01-26 MED ORDER — ONDANSETRON HCL 4 MG/2ML IJ SOLN
INTRAMUSCULAR | Status: DC | PRN
  Administered 2015-01-26: 4 mg via INTRAVENOUS

## 2015-01-26 MED ORDER — BUPIVACAINE HCL (PF) 0.25 % IJ SOLN
INTRAMUSCULAR | Status: AC
  Filled 2015-01-26: qty 30

## 2015-01-26 MED ORDER — ONDANSETRON HCL 4 MG/2ML IJ SOLN
INTRAMUSCULAR | Status: AC
  Filled 2015-01-26: qty 2

## 2015-01-26 MED ORDER — FENTANYL CITRATE (PF) 100 MCG/2ML IJ SOLN
INTRAMUSCULAR | Status: AC
  Filled 2015-01-26: qty 2

## 2015-01-26 MED ORDER — GLYCOPYRROLATE 0.2 MG/ML IJ SOLN
INTRAMUSCULAR | Status: DC | PRN
  Administered 2015-01-26: .8 mg via INTRAVENOUS

## 2015-01-26 MED ORDER — ROCURONIUM BROMIDE 100 MG/10ML IV SOLN
INTRAVENOUS | Status: DC | PRN
  Administered 2015-01-26: 40 mg via INTRAVENOUS
  Administered 2015-01-26: 10 mg via INTRAVENOUS

## 2015-01-26 MED ORDER — FENTANYL CITRATE (PF) 100 MCG/2ML IJ SOLN
INTRAMUSCULAR | Status: DC | PRN
  Administered 2015-01-26: 50 ug via INTRAVENOUS
  Administered 2015-01-26 (×2): 100 ug via INTRAVENOUS

## 2015-01-26 SURGICAL SUPPLY — 26 items
CHLORAPREP W/TINT 26ML (MISCELLANEOUS) ×2 IMPLANT
CLOTH BEACON ORANGE TIMEOUT ST (SAFETY) ×2 IMPLANT
DRSG COVADERM PLUS 2X2 (GAUZE/BANDAGES/DRESSINGS) ×4 IMPLANT
DRSG OPSITE POSTOP 3X4 (GAUZE/BANDAGES/DRESSINGS) ×2 IMPLANT
FORCEPS CUTTING 33CM 5MM (CUTTING FORCEPS) ×2 IMPLANT
GLOVE BIO SURGEON STRL SZ 6.5 (GLOVE) ×2 IMPLANT
GLOVE BIOGEL PI IND STRL 7.0 (GLOVE) ×3 IMPLANT
GLOVE BIOGEL PI INDICATOR 7.0 (GLOVE) ×3
GOWN STRL REUS W/TWL LRG LVL3 (GOWN DISPOSABLE) ×4 IMPLANT
LIQUID BAND (GAUZE/BANDAGES/DRESSINGS) ×2 IMPLANT
MANIPULATOR UTERINE 4.5 ZUMI (MISCELLANEOUS) ×2 IMPLANT
NS IRRIG 1000ML POUR BTL (IV SOLUTION) ×2 IMPLANT
PACK LAPAROSCOPY BASIN (CUSTOM PROCEDURE TRAY) ×2 IMPLANT
PAD TRENDELENBURG POSITION (MISCELLANEOUS) ×2 IMPLANT
SET IRRIG TUBING LAPAROSCOPIC (IRRIGATION / IRRIGATOR) IMPLANT
SLEEVE XCEL OPT CAN 5 100 (ENDOMECHANICALS) ×2 IMPLANT
SOLUTION ELECTROLUBE (MISCELLANEOUS) IMPLANT
SUT MNCRL AB 3-0 PS2 27 (SUTURE) ×2 IMPLANT
SUT VICRYL 0 UR6 27IN ABS (SUTURE) ×4 IMPLANT
SYRINGE 20CC LL (MISCELLANEOUS) ×2 IMPLANT
TOWEL OR 17X24 6PK STRL BLUE (TOWEL DISPOSABLE) ×4 IMPLANT
TRAY FOLEY CATH SILVER 14FR (SET/KITS/TRAYS/PACK) ×2 IMPLANT
TROCAR BALLN 12MMX100 BLUNT (TROCAR) ×2 IMPLANT
TROCAR XCEL NON-BLD 5MMX100MML (ENDOMECHANICALS) ×2 IMPLANT
WARMER LAPAROSCOPE (MISCELLANEOUS) ×2 IMPLANT
WATER STERILE IRR 1000ML POUR (IV SOLUTION) ×2 IMPLANT

## 2015-01-26 NOTE — Anesthesia Postprocedure Evaluation (Signed)
Anesthesia Post Note  Patient: [name]  Procedure(s) Performed: Procedure(s) (LRB): LAPAROSCOPIC UNILATERAL SALPINGECTOMY (Left)  Patient location during evaluation: PACU Anesthesia Type: General Level of consciousness: awake and alert Pain management: pain level controlled Vital Signs Assessment: post-procedure vital signs reviewed and stable Respiratory status: spontaneous breathing, nonlabored ventilation, respiratory function stable and patient connected to nasal cannula oxygen Cardiovascular status: blood pressure returned to baseline and stable Postop Assessment: no signs of nausea or vomiting Anesthetic complications: no    Last Vitals:  Filed Vitals:   01/26/15 1745 01/26/15 1800  BP: 125/77 122/72  Pulse: 58 58  Temp:    Resp: 12 13    Last Pain:  Filed Vitals:   01/26/15 1823  PainSc: Asleep                 Henessy Rohrer

## 2015-01-26 NOTE — Transfer of Care (Signed)
Immediate Anesthesia Transfer of Care Note  Patient: [name]  Procedure(s) Performed: Procedure(s): LAPAROSCOPIC UNILATERAL SALPINGECTOMY (Left)  Patient Location: PACU  Anesthesia Type:General  Level of Consciousness: awake, alert , oriented and patient cooperative  Airway & Oxygen Therapy: Patient Spontanous Breathing and Patient connected to nasal cannula oxygen  Post-op Assessment: Report given to RN and Post -op Vital signs reviewed and stable  Post vital signs: Reviewed and stable  Last Vitals:  Filed Vitals:   01/26/15 1214  BP: 130/88  Pulse: 61  Temp: 36.6 C  Resp: 18    Complications: No apparent anesthesia complications

## 2015-01-26 NOTE — Anesthesia Procedure Notes (Signed)
Procedure Name: Intubation Date/Time: 01/26/2015 3:40 PM Performed by: Raenette Rover Pre-anesthesia Checklist: Patient identified, Patient being monitored, Emergency Drugs available and Suction available Patient Re-evaluated:Patient Re-evaluated prior to inductionOxygen Delivery Method: Circle system utilized Preoxygenation: Pre-oxygenation with 100% oxygen Intubation Type: IV induction Ventilation: Mask ventilation without difficulty and Oral airway inserted - appropriate to patient size Laryngoscope Size: Mac and 3 Grade View: Grade I Tube type: Oral Tube size: 7.0 mm Number of attempts: 1 Airway Equipment and Method: Stylet Placement Confirmation: ETT inserted through vocal cords under direct vision,  breath sounds checked- equal and bilateral,  positive ETCO2 and CO2 detector Secured at: 21 cm Tube secured with: Tape Dental Injury: Teeth and Oropharynx as per pre-operative assessment

## 2015-01-26 NOTE — Discharge Instructions (Signed)
Laparoscopic Tubal Ligation Laparoscopic tubal ligation is a procedure that closes the fallopian tubes at a time other than right after childbirth. When the fallopian tubes are closed, the eggs that are released from the ovaries cannot enter the uterus, and sperm cannot reach the egg. Tubal ligation is also known as getting your "tubes tied." Tubal ligation is done so you will not be able to get pregnant or have a baby. Although this procedure may be undone (reversed), it should be considered permanent and irreversible. If you want to have future pregnancies, you should not have this procedure. LET [facility] CARE PROVIDER KNOW ABOUT:  Any allergies you have.  All medicines you are taking, including vitamins, herbs, eye drops, creams, and over-the-counter medicines. This includes any use of steroids, either by mouth or in cream form.  Previous problems you or members of your family have had with the use of anesthetics.  Any blood disorders you have.  Previous surgeries you have had.  Any medical conditions you may have.  Possibility of pregnancy, if this applies.  Any past pregnancies. RISKS AND COMPLICATIONS  Infection.  Bleeding.  Injury to surrounding organs.  Side effects from anesthetics.  Failure of the procedure.  Ectopic pregnancy.  Future regret about having the procedure done. BEFORE THE PROCEDURE  Ask your health care provider about:  Changing or stopping your regular medicines. This is especially important if you are taking diabetes medicines or blood thinners.  Taking medicines such as aspirin and ibuprofen. These medicines can thin your blood. Do not take these medicines before your procedure if your health care provider instructs you not to.  Follow instructions from your health care provider about eating and drinking restrictions.  Plan to have someone take you home after the procedure.  If you go home right after the procedure, plan to have someone  with you for 24 hours. PROCEDURE  You will be given one or more of the following:  A medicine that helps you relax (sedative).  A medicine that numbs the area (local anesthetic).  A medicine that makes you fall asleep (general anesthetic).  A medicine that is injected into an area of your body that numbs everything below the injection site (regional anesthetic).  If you have been given general anesthetic, a tube will be put down your throat to help you breathe.  Two small cuts (incisions) will be made in the lower abdominal area and near the belly button.  Your bladder may be emptied with a small tube (catheter).  Your abdomen will be inflated with a safe gas (carbon dioxide). This will help to give the surgeon room to operate and visualize, and it will help the surgeon to avoid other organs.  A thin, lighted tube (laparoscope) with a camera attached will be inserted into your abdomen through one of the incisions near the belly button. Other small instruments will be inserted through the other abdominal incision.  The fallopian tubes will be tied off or burned (cauterized), or they will be blocked with a clip, ring, or clamp. In many cases, a small portion in the center of each fallopian tube will also be removed.  After the fallopian tubes are blocked, the gas will be released from the abdomen.  The incisions will be closed with stitches (sutures).  A bandage (dressing) will be placed over the incisions. The procedure may vary among health care providers and hospitals. AFTER THE PROCEDURE  Your blood pressure, heart rate, breathing rate, and blood oxygen level  will be monitored often until the medicines you were given have worn off.  You will be given pain medicine as needed.  If you had general anesthetic, you may have some mild discomfort in your throat. This is from the breathing tube that was placed in your throat while you were sleeping.  You may experience discomfort in  the shoulder area from some trapped air between your liver and your diaphragm. This sensation is normal, and it will slowly go away on its own.  You will have some mild abdominal discomfort for 3--7 days.   This information is not intended to replace advice given to you by your health care provider. Make sure you discuss any questions you have with your health care provider.   Document Released: 04/02/2000 Document Revised: 05/11/2014 Document Reviewed: 04/07/2011 Elsevier Interactive Patient Education 2016 [facility] @ [phone] if:  You have a temperature greater than or equal to 100.4 degrees Farenheit orally You have pain that is not made better by the pain medication given and taken as directed You have excessive bleeding or problems urinating  Take Colace (Docusate Sodium/Stool Softener) 100 mg 2-3 times daily while taking narcotic pain medicine to avoid constipation or until bowel movements are regular.  You may drive after 48 hours as long as you're not taking narcotic pain medication You may walk up steps  You may shower tomorrow You may resume a regular diet  Keep incisions clean and dry Avoid anything in vagina until after your post-operative visit  DISCHARGE INSTRUCTIONS: Laparoscopy  The following instructions have been prepared to help you care for yourself upon your return home today.  Wound care:  Do not get the incision wet for the first 24 hours. The incision should be kept clean and dry.  The Band-Aids or dressings may be removed the day after surgery.  Should the incision become sore, red, and swollen after the first week, check with your doctor.  Personal hygiene:  Shower the day after your procedure.  Activity and limitations:  Do NOT drive or operate any equipment today.  Do NOT lift anything more than 15 pounds for 2-3 weeks after surgery.  Do NOT rest in bed all day.  Walking is encouraged. Walk each day,  starting slowly with 5-minute walks 3 or 4 times a day. Slowly increase the length of your walks.  Walk up and down stairs slowly.  Do NOT do strenuous activities, such as golfing, playing tennis, bowling, running, biking, weight lifting, gardening, mowing, or vacuuming for 2-4 weeks. Ask your doctor when it is okay to start.  Diet: Eat a light meal as desired this evening. You may resume your usual diet tomorrow.  Return to work: This is dependent on the type of work you do. For the most part you can return to a desk job within a week of surgery. If you are more active at work, please discuss this with your doctor.  What to expect after your surgery: You may have a slight burning sensation when you urinate on the first day. You may have a very small amount of blood in the urine. Expect to have a small amount of vaginal discharge/light bleeding for 1-2 weeks. It is not unusual to have abdominal soreness and bruising for up to 2 weeks. You may be tired and need more rest for about 1 week. You may experience shoulder pain for 24-72 hours. Lying flat in bed may relieve it.  Call your doctor  for any of the following:  Develop a fever of 100.4 or greater  Inability to urinate 6 hours after discharge from hospital  Severe pain not relieved by pain medications  Persistent of heavy bleeding at incision site  Redness or swelling around incision site after a week  Increasing nausea or vomiting  Patient Signature________________________________________ Nurse Signature_________________________________________ Post Anesthesia Home Care Instructions  Activity: Get plenty of rest for the remainder of the day. A responsible adult should stay with you for 24 hours following the procedure.  For the next 24 hours, DO NOT: -Drive a car -Paediatric nurse -Drink alcoholic beverages -Take any medication unless instructed by your physician -Make any legal decisions or sign important  papers.  Meals: Start with liquid foods such as gelatin or soup. Progress to regular foods as tolerated. Avoid greasy, spicy, heavy foods. If nausea and/or vomiting occur, drink only clear liquids until the nausea and/or vomiting subsides. Call your physician if vomiting continues.  Special Instructions/Symptoms: Your throat may feel dry or sore from the anesthesia or the breathing tube placed in your throat during surgery. If this causes discomfort, gargle with warm salt water. The discomfort should disappear within 24 hours.  If you had a scopolamine patch placed behind your ear for the management of post- operative nausea and/or vomiting:  1. The medication in the patch is effective for 72 hours, after which it should be removed.  Wrap patch in a tissue and discard in the trash. Wash hands thoroughly with soap and water. 2. You may remove the patch earlier than 72 hours if you experience unpleasant side effects which may include dry mouth, dizziness or visual disturbances. 3. Avoid touching the patch. Wash your hands with soap and water after contact with the patch.

## 2015-01-26 NOTE — Anesthesia Preprocedure Evaluation (Signed)
Anesthesia Evaluation  Patient identified by MRN, date of birth, ID band Patient awake    Reviewed: Allergy & Precautions, NPO status , Patient's Chart, lab work & pertinent test results  History of Anesthesia Complications Negative for: history of anesthetic complications  Airway Mallampati: II  TM Distance: >3 FB Neck ROM: Full    Dental no notable dental hx. (+) Dental Advisory Given   Pulmonary neg pulmonary ROS, former smoker,    Pulmonary exam normal breath sounds clear to auscultation       Cardiovascular hypertension, Pt. on medications Normal cardiovascular exam Rhythm:Regular Rate:Normal     Neuro/Psych  Headaches, PSYCHIATRIC DISORDERS Anxiety Depression    GI/Hepatic Neg liver ROS, GERD  Medicated and Controlled,  Endo/Other  diabetesMorbid obesity  Renal/GU negative Renal ROS  negative genitourinary   Musculoskeletal negative musculoskeletal ROS (+)   Abdominal   Peds negative pediatric ROS (+)  Hematology negative hematology ROS (+)   Anesthesia Other Findings   Reproductive/Obstetrics negative OB ROS                             Anesthesia Physical Anesthesia Plan  ASA: III  Anesthesia Plan: General   Post-op Pain Management:    Induction: Intravenous  Airway Management Planned: Oral ETT  Additional Equipment:   Intra-op Plan:   Post-operative Plan: Extubation in OR  Informed Consent: I have reviewed the patients History and Physical, chart, labs and discussed the procedure including the risks, benefits and alternatives for the proposed anesthesia with the patient or authorized representative who has indicated his/her understanding and acceptance.   Dental advisory given  Plan Discussed with: CRNA  Anesthesia Plan Comments:         Anesthesia Quick Evaluation

## 2015-01-27 ENCOUNTER — Encounter (HOSPITAL_COMMUNITY): Payer: Self-pay | Admitting: Obstetrics and Gynecology

## 2015-01-31 ENCOUNTER — Encounter (HOSPITAL_COMMUNITY): Payer: Self-pay | Admitting: Psychology

## 2015-01-31 ENCOUNTER — Ambulatory Visit (HOSPITAL_COMMUNITY): Payer: Self-pay | Admitting: Psychology

## 2015-01-31 NOTE — Progress Notes (Signed)
[name] is a 35 y.o. female patient who called front office staff and cancelled today's appointment.         [name], LPC

## 2015-02-09 ENCOUNTER — Other Ambulatory Visit (INDEPENDENT_AMBULATORY_CARE_PROVIDER_SITE_OTHER): Payer: Federal, State, Local not specified - PPO

## 2015-02-09 ENCOUNTER — Encounter: Payer: Federal, State, Local not specified - PPO | Admitting: Internal Medicine

## 2015-02-09 ENCOUNTER — Encounter: Payer: Self-pay | Admitting: Family

## 2015-02-09 ENCOUNTER — Ambulatory Visit (INDEPENDENT_AMBULATORY_CARE_PROVIDER_SITE_OTHER): Payer: Federal, State, Local not specified - PPO | Admitting: Family

## 2015-02-09 VITALS — BP 122/88 | HR 59 | Temp 97.9°F | Resp 18 | Ht 63.0 in | Wt 253.0 lb

## 2015-02-09 DIAGNOSIS — Z0000 Encounter for general adult medical examination without abnormal findings: Secondary | ICD-10-CM

## 2015-02-09 DIAGNOSIS — E559 Vitamin D deficiency, unspecified: Secondary | ICD-10-CM

## 2015-02-09 DIAGNOSIS — F411 Generalized anxiety disorder: Secondary | ICD-10-CM

## 2015-02-09 DIAGNOSIS — M25561 Pain in right knee: Secondary | ICD-10-CM | POA: Diagnosis not present

## 2015-02-09 LAB — CBC
HCT: 40.9 % (ref 36.0–46.0)
Hemoglobin: 13.5 g/dL (ref 12.0–15.0)
MCHC: 33.1 g/dL (ref 30.0–36.0)
MCV: 90.2 fl (ref 78.0–100.0)
PLATELETS: 239 10*3/uL (ref 150.0–400.0)
RBC: 4.53 Mil/uL (ref 3.87–5.11)
RDW: 13.4 % (ref 11.5–15.5)
WBC: 7 10*3/uL (ref 4.0–10.5)

## 2015-02-09 LAB — COMPREHENSIVE METABOLIC PANEL
ALBUMIN: 4.2 g/dL (ref 3.5–5.2)
ALK PHOS: 89 U/L (ref 39–117)
ALT: 21 U/L (ref 0–35)
AST: 15 U/L (ref 0–37)
BILIRUBIN TOTAL: 0.5 mg/dL (ref 0.2–1.2)
BUN: 16 mg/dL (ref 6–23)
CALCIUM: 9.6 mg/dL (ref 8.4–10.5)
CHLORIDE: 103 meq/L (ref 96–112)
CO2: 28 mEq/L (ref 19–32)
CREATININE: 1.04 mg/dL (ref 0.40–1.20)
GFR: 63.95 mL/min (ref >60.00)
Glucose, Bld: 94 mg/dL (ref 70–99)
Potassium: 3.9 mEq/L (ref 3.5–5.1)
SODIUM: 141 meq/L (ref 135–145)
TOTAL PROTEIN: 7.1 g/dL (ref 6.0–8.3)

## 2015-02-09 LAB — LIPID PANEL
CHOLESTEROL: 220 mg/dL — AB (ref 0–200)
HDL: 37.4 mg/dL — ABNORMAL LOW (ref >39.00)
LDL Cholesterol: 152 mg/dL — ABNORMAL HIGH (ref 0–99)
NonHDL: 182.38
TRIGLYCERIDES: 151 mg/dL — AB (ref 0.0–149.0)
Total CHOL/HDL Ratio: 6
VLDL: 30.2 mg/dL (ref 0.0–40.0)

## 2015-02-09 LAB — TSH: TSH: 1.37 u[IU]/mL (ref 0.35–4.50)

## 2015-02-09 MED ORDER — CYCLOBENZAPRINE HCL 10 MG PO TABS: 10.0000 mg | ORAL_TABLET | Freq: Three times a day (TID) | ORAL | Status: DC | PRN

## 2015-02-09 MED ORDER — DIAZEPAM 5 MG PO TABS: 5.0000 mg | ORAL_TABLET | Freq: Four times a day (QID) | ORAL | Status: DC | PRN

## 2015-02-09 MED ORDER — MELOXICAM 15 MG PO TABS: 15.0000 mg | ORAL_TABLET | Freq: Every day | ORAL | Status: DC

## 2015-02-09 NOTE — Assessment & Plan Note (Signed)
.  1) Anticipatory Guidance: Discussed importance of wearing a seatbelt while driving and not texting while driving; changing batteries in smoke detector at least once annually; wearing suntan lotion when outside; eating a balanced and moderate diet; getting physical activity at least 30 minutes per day.  2) Immunizations / Screenings / Labs:  All immunizations are up-to-date per recommendations. Previously noted to have low vitamin D during pregnancy. Obtain vitamin D. All screenings are up-to-date per recommendations. Obtain CBC, CMET, Lipid profile and TSH.   Overall well exam with risk factors for cardiovascular disease including hyperlipidemia and obesity. Current BMI is 44 indicating morbid obesity. Goal is to lose approximately 5-10% of current body weight through increasing physical activity to 30 minutes of moderate level activity daily. Discussed importance of eating a balanced, varied, and moderate nutritional intake that focuses on nutrient dense foods. Hyperlipidemia (evaluated through lipid profile today with changes as needed. Continue other healthy lifestyle behaviors and choices. Was noted to have gestational diabetes during previous pregnancy will obtain A1c and one month as infant will be 3 months. Follow-up prevention exam in 1 year. Follow-up office visit pending blood work.

## 2015-02-09 NOTE — Patient Instructions (Addendum)
Thank you for choosing [facility].  Summary/Instructions:  Your prescription(s) have been submitted to your pharmacy or been printed and provided for you. Please take as directed and contact our office if you believe you are having problem(s) with the medication(s) or have any questions.  Please stop by the lab on the basement level of the building for your blood work. Your results will be released to [place] (or called to you) after review, usually within 72 hours after test completion. If any changes need to be made, you will be notified at that same time.  Stop by the lab in about 1 month for A1c.   Health Maintenance, Female Adopting a healthy lifestyle and getting preventive care can go a long way to promote health and wellness. Talk with your health care provider about what schedule of regular examinations is right for you. This is a good chance for you to check in with your provider about disease prevention and staying healthy. In between checkups, there are plenty of things you can do on your own. Experts have done a lot of research about which lifestyle changes and preventive measures are most likely to keep you healthy. Ask your health care provider for more information. WEIGHT AND DIET  Eat a healthy diet  Be sure to include plenty of vegetables, fruits, low-fat dairy products, and lean protein.  Do not eat a lot of foods high in solid fats, added sugars, or salt.  Get regular exercise. This is one of the most important things you can do for your health.  Most adults should exercise for at least 150 minutes each week. The exercise should increase your heart rate and make you sweat (moderate-intensity exercise).  Most adults should also do strengthening exercises at least twice a week. This is in addition to the moderate-intensity exercise.  Maintain a healthy weight  Body mass index (BMI) is a measurement that can be used to identify possible weight problems. It  estimates body fat based on height and weight. Your health care provider can help determine your BMI and help you achieve or maintain a healthy weight.  For females 31 years of age and older:   A BMI below 18.5 is considered underweight.  A BMI of 18.5 to 24.9 is normal.  A BMI of 25 to 29.9 is considered overweight.  A BMI of 30 and above is considered obese.  Watch levels of cholesterol and blood lipids  You should start having your blood tested for lipids and cholesterol at 36 years of age, then have this test every 5 years.  You may need to have your cholesterol levels checked more often if:  Your lipid or cholesterol levels are high.  You are older than 36 years of age.  You are at high risk for heart disease.  CANCER SCREENING   Lung Cancer  Lung cancer screening is recommended for adults 5-27 years old who are at high risk for lung cancer because of a history of smoking.  A yearly low-dose CT scan of the lungs is recommended for people who:  Currently smoke.  Have quit within the past 15 years.  Have at least a 30-pack-year history of smoking. A pack year is smoking an average of one pack of cigarettes a day for 1 year.  Yearly screening should continue until it has been 15 years since you quit.  Yearly screening should stop if you develop a health problem that would prevent you from having lung cancer treatment.  Breast  Cancer  Practice breast self-awareness. This means understanding how your breasts normally appear and feel.  It also means doing regular breast self-exams. Let your health care provider know about any changes, no matter how small.  If you are in your 20s or 30s, you should have a clinical breast exam (CBE) by a health care provider every 1-3 years as part of a regular health exam.  If you are 42 or older, have a CBE every year. Also consider having a breast X-ray (mammogram) every year.  If you have a family history of breast cancer, talk  to your health care provider about genetic screening.  If you are at high risk for breast cancer, talk to your health care provider about having an MRI and a mammogram every year.  Breast cancer gene (BRCA) assessment is recommended for women who have family members with BRCA-related cancers. BRCA-related cancers include:  Breast.  Ovarian.  Tubal.  Peritoneal cancers.  Results of the assessment will determine the need for genetic counseling and BRCA1 and BRCA2 testing. Cervical Cancer Your health care provider may recommend that you be screened regularly for cancer of the pelvic organs (ovaries, uterus, and vagina). This screening involves a pelvic examination, including checking for microscopic changes to the surface of your cervix (Pap test). You may be encouraged to have this screening done every 3 years, beginning at age 78.  For women ages 7-65, health care providers may recommend pelvic exams and Pap testing every 3 years, or they may recommend the Pap and pelvic exam, combined with testing for human papilloma virus (HPV), every 5 years. Some types of HPV increase your risk of cervical cancer. Testing for HPV may also be done on women of any age with unclear Pap test results.  Other health care providers may not recommend any screening for nonpregnant women who are considered low risk for pelvic cancer and who do not have symptoms. Ask your health care provider if a screening pelvic exam is right for you.  If you have had past treatment for cervical cancer or a condition that could lead to cancer, you need Pap tests and screening for cancer for at least 20 years after your treatment. If Pap tests have been discontinued, your risk factors (such as having a new sexual partner) need to be reassessed to determine if screening should resume. Some women have medical problems that increase the chance of getting cervical cancer. In these cases, your health care provider may recommend more  frequent screening and Pap tests. Colorectal Cancer  This type of cancer can be detected and often prevented.  Routine colorectal cancer screening usually begins at 36 years of age and continues through 36 years of age.  Your health care provider may recommend screening at an earlier age if you have risk factors for colon cancer.  Your health care provider may also recommend using home test kits to check for hidden blood in the stool.  A small camera at the end of a tube can be used to examine your colon directly (sigmoidoscopy or colonoscopy). This is done to check for the earliest forms of colorectal cancer.  Routine screening usually begins at age 56.  Direct examination of the colon should be repeated every 5-10 years through 36 years of age. However, you may need to be screened more often if early forms of precancerous polyps or small growths are found. Skin Cancer  Check your skin from head to toe regularly.  Tell your health care provider  about any new moles or changes in moles, especially if there is a change in a mole's shape or color.  Also tell your health care provider if you have a mole that is larger than the size of a pencil eraser.  Always use sunscreen. Apply sunscreen liberally and repeatedly throughout the day.  Protect yourself by wearing long sleeves, pants, a wide-brimmed hat, and sunglasses whenever you are outside. HEART DISEASE, DIABETES, AND HIGH BLOOD PRESSURE   High blood pressure causes heart disease and increases the risk of stroke. High blood pressure is more likely to develop in:  People who have blood pressure in the high end of the normal range (130-139/85-89 mm Hg).  People who are overweight or obese.  People who are African American.  If you are 66-49 years of age, have your blood pressure checked every 3-5 years. If you are 17 years of age or older, have your blood pressure checked every year. You should have your blood pressure measured  twice--once when you are at a hospital or clinic, and once when you are not at a hospital or clinic. Record the average of the two measurements. To check your blood pressure when you are not at a hospital or clinic, you can use:  An automated blood pressure machine at a pharmacy.  A home blood pressure monitor.  If you are between 42 years and 36 years old, ask your health care provider if you should take aspirin to prevent strokes.  Have regular diabetes screenings. This involves taking a blood sample to check your fasting blood sugar level.  If you are at a normal weight and have a low risk for diabetes, have this test once every three years after 36 years of age.  If you are overweight and have a high risk for diabetes, consider being tested at a younger age or more often. PREVENTING INFECTION  Hepatitis B  If you have a higher risk for hepatitis B, you should be screened for this virus. You are considered at high risk for hepatitis B if:  You were born in a country where hepatitis B is common. Ask your health care provider which countries are considered high risk.  Your parents were born in a high-risk country, and you have not been immunized against hepatitis B (hepatitis B vaccine).  You have HIV or AIDS.  You use needles to inject street drugs.  You live with someone who has hepatitis B.  You have had sex with someone who has hepatitis B.  You get hemodialysis treatment.  You take certain medicines for conditions, including cancer, organ transplantation, and autoimmune conditions. Hepatitis C  Blood testing is recommended for:  Everyone born from [birth year] through 1966.  Anyone with known risk factors for hepatitis C. Sexually transmitted infections (STIs)  You should be screened for sexually transmitted infections (STIs) including gonorrhea and chlamydia if:  You are sexually active and are younger than 36 years of age.  You are older than 36 years of age and your  health care provider tells you that you are at risk for this type of infection.  Your sexual activity has changed since you were last screened and you are at an increased risk for chlamydia or gonorrhea. Ask your health care provider if you are at risk.  If you do not have HIV, but are at risk, it may be recommended that you take a prescription medicine daily to prevent HIV infection. This is called pre-exposure prophylaxis (PrEP). You are considered  at risk if:  You are sexually active and do not regularly use condoms or know the HIV status of your partner(s).  You take drugs by injection.  You are sexually active with a partner who has HIV. Talk with your health care provider about whether you are at high risk of being infected with HIV. If you choose to begin PrEP, you should first be tested for HIV. You should then be tested every 3 months for as long as you are taking PrEP.  PREGNANCY   If you are premenopausal and you may become pregnant, ask your health care provider about preconception counseling.  If you may become pregnant, take 400 to 800 micrograms (mcg) of folic acid every day.  If you want to prevent pregnancy, talk to your health care provider about birth control (contraception). OSTEOPOROSIS AND MENOPAUSE   Osteoporosis is a disease in which the bones lose minerals and strength with aging. This can result in serious bone fractures. Your risk for osteoporosis can be identified using a bone density scan.  If you are 44 years of age or older, or if you are at risk for osteoporosis and fractures, ask your health care provider if you should be screened.  Ask your health care provider whether you should take a calcium or vitamin D supplement to lower your risk for osteoporosis.  Menopause may have certain physical symptoms and risks.  Hormone replacement therapy may reduce some of these symptoms and risks. Talk to your health care provider about whether hormone replacement  therapy is right for you.  HOME CARE INSTRUCTIONS   Schedule regular health, dental, and eye exams.  Stay current with your immunizations.   Do not use any tobacco products including cigarettes, chewing tobacco, or electronic cigarettes.  If you are pregnant, do not drink alcohol.  If you are breastfeeding, limit how much and how often you drink alcohol.  Limit alcohol intake to no more than 1 drink per day for nonpregnant women. One drink equals 12 ounces of beer, 5 ounces of wine, or 1 ounces of hard liquor.  Do not use street drugs.  Do not share needles.  Ask your health care provider for help if you need support or information about quitting drugs.  Tell your health care provider if you often feel depressed.  Tell your health care provider if you have ever been abused or do not feel safe at home.   This information is not intended to replace advice given to you by your health care provider. Make sure you discuss any questions you have with your health care provider.   Document Released: 07/10/2010 Document Revised: 01/15/2014 Document Reviewed: 11/26/2012 Elsevier Interactive Patient Education [employer].

## 2015-02-09 NOTE — Progress Notes (Signed)
Pre visit review using our clinic review tool, if applicable. No additional management support is needed unless otherwise documented below in the visit note. 

## 2015-02-09 NOTE — Progress Notes (Signed)
Subjective:    Patient ID: [name], female    DOB: 07/17/1980, 35 y.o.   MRN: [id number]  Chief Complaint  Patient presents with  . CPE    CPE fasting, would refill of medications dc'd during pregnancy, meloxicam, cyclobenzaprine, and diazapam    HPI:  [name] is a 35 y.o. female who presents today for an annual wellness visit.   1) Health Maintenance -   Diet - Averaging about 2 meals per day consisting fruits, vegetables, meat, starches; 2-3 times per week for fast/processed foods; Caffeine intake of 4-5 cups per day  Exercise - No structured exercise   2) Preventative Exams / Immunizations:  Dental -- Up to date  Vision -- Up to date   Health Maintenance  Topic Date Due  . PNEUMOCOCCAL POLYSACCHARIDE VACCINE (1) 09/15/1981  . FOOT EXAM  09/15/1989  . OPHTHALMOLOGY EXAM  09/15/1989  . URINE MICROALBUMIN  09/15/1989  . HEMOGLOBIN A1C  07/08/2014  . PAP SMEAR  02/13/2015  . INFLUENZA VACCINE  08/09/2015  . TETANUS/TDAP  12/12/2024  . HIV Screening  Completed    Immunization History  Administered Date(s) Administered  . Influenza Whole 10/30/2011  . Influenza,inj,Quad PF,36+ Mos 10/21/2012  . Td 01/08/2005    Allergies  Allergen Reactions  . Codeine     STOMACH CRAMPING  . Doxycycline Nausea And Vomiting     Outpatient Prescriptions Prior to Visit  Medication Sig Dispense Refill  . [facility] D3 2000 UNITS CAPS Take 4,000 Units by mouth daily.   0  . FLUoxetine (PROZAC) 20 MG tablet Take 2 tablets (40 mg total) by mouth daily. 60 tablet 1  . ibuprofen (ADVIL,MOTRIN) 600 MG tablet 1 po pc every 6 hours for 3 days then as needed for pain 30 tablet 2  . oxyCODONE-acetaminophen (PERCOCET/ROXICET) 5-325 MG tablet Take 1-2 tablets by mouth every 6 (six) hours as needed for severe pain. 30 tablet 0   No facility-administered medications prior to visit.     Past Medical History  Diagnosis Date  . Depression   . Cluster headaches   . GERD  (gastroesophageal reflux disease)   . Hyperlipidemia   . Snoring     sleep study 02/2012 with min AHI events  . Anxiety   . Eczema   . Sciatica   . Hypertension     no meds currently  . Gestational diabetes 2012, 2016     Past Surgical History  Procedure Laterality Date  . Ganglion removal from (l) wrist  1998  . Cesarean section  8 & 11    x's 2  . Wisdom tooth extraction    . Colposcopy    . Dental surgery    . Cesarean section with bilateral tubal ligation Bilateral 12/18/2014    Procedure: REPEAT CESAREAN SECTION WITH BILATERAL TUBAL LIGATION;  Surgeon: Crawford Givens, MD;  Location: [facility];  Service: Obstetrics;  Laterality: Bilateral;  . Laparoscopic unilateral salpingectomy Left 01/26/2015    Procedure: LAPAROSCOPIC UNILATERAL SALPINGECTOMY;  Surgeon: Crawford Givens, MD;  Location: [facility];  Service: Gynecology;  Laterality: Left;     Family History  Problem Relation Age of Onset  . Arthritis Mother   . Alcohol abuse Mother   . Mental illness Mother   . Cancer Mother 80    squamous - unknown primary  . Coronary artery disease Mother 27    MI with stent  . Anxiety disorder Mother   . Depression Mother   . Arthritis Father   .  Alcohol abuse Father   . Hyperlipidemia Father   . Heart disease Father   . Kidney disease Father   . Stroke Father   . Mental illness Father   . Drug abuse Father   . Diabetes Maternal Grandmother   . Heart disease Maternal Grandmother   . Stroke Maternal Grandfather   . Hypertension Paternal Grandfather   . Hyperlipidemia Paternal Grandfather   . Arthritis Other   . Alcohol abuse Other   . Breast cancer Paternal Aunt   . Anxiety disorder Sister      Social History   Social History  . Marital Status: Married    Spouse Name: N/A  . Number of Children: 3  . Years of Education: 16   Occupational History  . Not on file.   Social History Main Topics  . Smoking status: Former Smoker -- 0.33 packs/day for 15 years    Types:  Cigarettes    Quit date: 04/23/2014  . Smokeless tobacco: Never Used  . Alcohol Use: Yes     Comment: once a month or less  . Drug Use: No  . Sexual Activity: Yes    Birth Control/ Protection: None   Other Topics Concern  . 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    Review of Systems  Constitutional: Denies fever, chills, fatigue, or significant weight gain/loss. HENT: Head: Denies headache or neck pain Ears: Denies changes in hearing, ringing in ears, earache, drainage Nose: Denies discharge, stuffiness, itching, nosebleed, sinus pain Throat: Denies sore throat, hoarseness, dry mouth, sores, thrush Eyes: Denies loss/changes in vision, pain, redness, blurry/double vision, flashing lights Cardiovascular: Denies chest pain/discomfort, tightness, palpitations, shortness of breath with activity, difficulty lying down, swelling, sudden awakening with shortness of breath Respiratory: Denies shortness of breath, cough, sputum production, wheezing Gastrointestinal: Denies dysphasia, heartburn, change in appetite, nausea, change in bowel habits, rectal bleeding, constipation, diarrhea, yellow skin or eyes Genitourinary: Denies frequency, urgency, burning/pain, blood in urine, incontinence, change in urinary strength. Musculoskeletal: Denies muscle/joint pain, stiffness, back pain, redness or swelling of joints, trauma Skin: Denies rashes, lumps, itching, dryness, color changes, or hair/nail changes Neurological: Denies dizziness, fainting, seizures, weakness, numbness, tingling, tremor Psychiatric - Denies nervousness, stress, depression or memory loss Endocrine: Denies heat or cold intolerance, sweating, frequent urination, excessive thirst, changes in appetite Hematologic: Denies ease of bruising or bleeding     Objective:    BP  122/88 mmHg  Pulse 59  Temp(Src) 97.9 F (36.6 C) (Oral)  Resp 18  Ht 5\' 3"  (1.6 m)  Wt 253 lb (114.76 kg)  BMI 44.83 kg/m2  SpO2 97% Nursing note and vital signs reviewed.  Physical Exam  Constitutional: She is oriented to person, place, and time. She appears well-developed and well-nourished.  HENT:  Head: Normocephalic.  Right Ear: Hearing, tympanic membrane, external ear and ear canal normal.  Left Ear: Hearing, tympanic membrane, external ear and ear canal normal.  Nose: Nose normal.  Mouth/Throat: Uvula is midline, oropharynx is clear and moist and mucous membranes are normal.  Eyes: Conjunctivae and EOM are normal. Pupils are equal, round, and reactive to light.  Neck: Neck supple. No JVD present. No tracheal deviation present. No thyromegaly present.  Cardiovascular: Normal rate, regular rhythm, normal heart sounds and intact distal pulses.  Pulmonary/Chest: Effort normal and breath sounds normal.  Abdominal: Soft. Bowel sounds are normal. She exhibits no distension and no mass. There is no tenderness. There is no rebound and no guarding.  Musculoskeletal: Normal range of motion. She exhibits no edema or tenderness.  Lymphadenopathy:    She has no cervical adenopathy.  Neurological: She is alert and oriented to person, place, and time. She has normal reflexes. No cranial nerve deficit. She exhibits normal muscle tone. Coordination normal.  Skin: Skin is warm and dry.  Psychiatric: She has a normal mood and affect. Her behavior is normal. Judgment and thought content normal.       Assessment & Plan:   Problem List Items Addressed This Visit      Other   Right knee pain   Relevant Medications   meloxicam (MOBIC) 15 MG tablet   cyclobenzaprine (FLEXERIL) 10 MG tablet   Routine general medical examination at a health care facility - Primary    .1) Anticipatory Guidance: Discussed importance of wearing a seatbelt while driving and not texting while driving; changing  batteries in smoke detector at least once annually; wearing suntan lotion when outside; eating a balanced and moderate diet; getting physical activity at least 30 minutes per day.  2) Immunizations / Screenings / Labs:  All immunizations are up-to-date per recommendations. Previously noted to have low vitamin D during pregnancy. Obtain vitamin D. All screenings are up-to-date per recommendations. Obtain CBC, CMET, Lipid profile and TSH.   Overall well exam with risk factors for cardiovascular disease including hyperlipidemia and obesity. Current BMI is 44 indicating morbid obesity. Goal is to lose approximately 5-10% of current body weight through increasing physical activity to 30 minutes of moderate level activity daily. Discussed importance of eating a balanced, varied, and moderate nutritional intake that focuses on nutrient dense foods. Hyperlipidemia (evaluated through lipid profile today with changes as needed. Continue other healthy lifestyle behaviors and choices. Was noted to have gestational diabetes during previous pregnancy will obtain A1c and one month as infant will be 3 months. Follow-up prevention exam in 1 year. Follow-up office visit pending blood work.       Relevant Orders   CBC   Comprehensive metabolic panel   Lipid panel   TSH   GAD (generalized anxiety disorder)   Relevant Medications   diazepam (VALIUM) 5 MG tablet    Other Visit Diagnoses    Vitamin D deficiency        Relevant Orders    Vitamin D 1,25 dihydroxy

## 2015-02-11 LAB — VITAMIN D 1,25 DIHYDROXY
VITAMIN D 1, 25 (OH) TOTAL: 35 pg/mL (ref 18–72)
Vitamin D2 1, 25 (OH)2: <8 pg/mL
Vitamin D3 1, 25 (OH)2: 35 pg/mL

## 2015-02-14 ENCOUNTER — Encounter: Payer: Self-pay | Admitting: Family

## 2015-02-21 ENCOUNTER — Ambulatory Visit (HOSPITAL_COMMUNITY): Payer: Self-pay | Admitting: Psychology

## 2015-02-21 ENCOUNTER — Encounter (HOSPITAL_COMMUNITY): Payer: Self-pay | Admitting: Psychology

## 2015-02-21 NOTE — Progress Notes (Signed)
[name] is a 35 y.o. female patient who didn't show for her appointment today.  Letter sent.         [name], LPC

## 2015-03-07 ENCOUNTER — Ambulatory Visit (INDEPENDENT_AMBULATORY_CARE_PROVIDER_SITE_OTHER): Payer: Federal, State, Local not specified - PPO | Admitting: Psychology

## 2015-03-07 DIAGNOSIS — F330 Major depressive disorder, recurrent, mild: Secondary | ICD-10-CM
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
## 2015-03-09 NOTE — Op Note (Addendum)
dications: Pt has multiparity and unable to   Pre-operative Diagnosis: Multiparity desires sterilization  Post-operative Diagnosis: same  Surgeon: [id number]   Assistants: Earnstine Regal PA  Anesthesia: General endotracheal anesthesia   Procedure : Laparoscopic left Salpingectomy  Procedure Details  The patient was seen in the Holding Room. The risks, benefits, complications, treatment options, and expected outcomes were discussed with the patient. The possibilities of reaction to medication, pulmonary aspiration, perforation of viscus, bleeding, recurrent infection, the need for additional procedures, failure to diagnose a condition, and creating a complication requiring transfusion or operation were discussed with the patient. The patient concurred with the proposed plan, giving informed consent. The patient was taken to the Operating Room, identified as [name] and the procedure verified as Diagnostic Laparoscopy with B sallpingecotmy. A Time Out was held and the above information confirmed.  After induction of general anesthesia, the patient was placed in modified dorsal lithotomy position where she was prepped, draped, and catheterized in the normal, sterile fashion. A foley catheter was placed..  The cervix was visualized and an intrauterine manipulator was placed. A 2 cm umbilical incision was then performed.and carried down to the fascia.  The fascia was then opened and extended bilaterally.  Peritoneum was then entered.  o vicryl was then placed around the fascia in a circumferential fashion.   The hasson was placed and ancored to the suture.. Normal pelvic anatomy was noted.   Uterus had a 2 cm fundal fibroid.  The tube, ovaries and appendix apperared normal.   The anterior and  Posterior culdesac and liver appeared normal.     Two 22mm trocars were placed in the right and left lower quadrants of the abdomen under direct visualization of the laparoscope.  The left tube  was removed with the gyrus and hemostasis was noted.   Following the procedure the umbilical hasson was removed after intra-abdominal carbon dioxide was expressed. The fascia was reaproximated by tying the 0 vicryl suture.   The 71mm skin incision was closed with dermabond.  The 10 mm incision was closed with a  subcuticular suture of 3-0 monocryl. The intrauterine manipulator was then removed.  The tenaculum site was oozing and made henmostatic with silver nitrate.   Instrument, sponge, and needle counts were correct prior to abdominal closure and at the conclusion of the case.  Findings: See above Estimated Blood Loss:  Minimal         Drains: none         Total IV Fluids:Intravenous fluids were administered, normal saline  m         Specimens: none              Complications:  None; patient tolerated the procedure well.         Disposition: PACU - hemodynamically stable.         Condition: stable

## 2015-03-15 ENCOUNTER — Ambulatory Visit (INDEPENDENT_AMBULATORY_CARE_PROVIDER_SITE_OTHER): Payer: Federal, State, Local not specified - PPO | Admitting: Psychiatry

## 2015-03-15 ENCOUNTER — Encounter (HOSPITAL_COMMUNITY): Payer: Self-pay | Admitting: Psychiatry

## 2015-03-15 VITALS — BP 146/83 | HR 94 | Ht 62.0 in | Wt 259.4 lb

## 2015-03-15 DIAGNOSIS — F411 Generalized anxiety disorder: Secondary | ICD-10-CM

## 2015-03-15 DIAGNOSIS — F332 Major depressive disorder, recurrent severe without psychotic features: Secondary | ICD-10-CM

## 2015-03-15 DIAGNOSIS — F4010 Social phobia, unspecified: Secondary | ICD-10-CM

## 2015-03-15 MED ORDER — FLUOXETINE HCL 20 MG PO TABS: 40.0000 mg | ORAL_TABLET | Freq: Every day | ORAL | Status: DC
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
## 2015-04-06 ENCOUNTER — Ambulatory Visit (INDEPENDENT_AMBULATORY_CARE_PROVIDER_SITE_OTHER): Payer: Federal, State, Local not specified - PPO | Admitting: Psychology

## 2015-04-06 DIAGNOSIS — F330 Major depressive disorder, recurrent, mild: Secondary | ICD-10-CM

## 2015-04-06 DIAGNOSIS — F411 Generalized anxiety disorder: Secondary | ICD-10-CM | POA: Diagnosis not present

## 2015-04-06 NOTE — Progress Notes (Signed)
   THERAPIST PROGRESS NOTE  Session Time: 8.02am-8.52am  Participation Level: Active  Behavioral Response: Well GroomedAlertTired- affect WNL  Type of Therapy: Individual Therapy  Treatment Goals addressed: Diagnosis: MDD, GAD an dgoal 1  Interventions: CBT  Summary: [name] is a 35 y.o. female who presents with report of feeling tired past couple of days.  Pt reported that she is getting better sleep overall- infant only waking one time a night usually.  Pt reported that doesn't wake feeling rested and acknowledges not using sleep machine may be effecting.  Pt reported that she is looking forward to daughter's birthday party celebrating w/ brother who will be done. Pt reported that her father informed that he is moving to better neighborhood and expects them to visit then- pt discussed how still hasn't sought help.  Pt discussed mixed feelings re: mom- acknowledging she wasn't perfect- but feeling that she shouldn't be thinking poorly of her.  Pt challenged that mom wasn't perfect- was human and ok to identify that she made mistakes- not tainting her memory to do so.  Pt discussed want to quit smoking- started back to 2 a day- pt discussed need to find behaviors to replace that give her a break.  Pt also discussed communication w/ husband on disagreement last night- how to express her thoughts assertively.    Suicidal/Homicidal: Nowithout intent/plan  Therapist Response: Assessed pt current functioning per pt report.  Explored w/pt her sleep and potential impacts she can change.  Processed w/pt her feelings re: mom and thoughts about her choices and impact had on them growing up.  Assisted pt in challenging beliefs and how to reframe.  Discussed w/pt want for quitting and alternatives to her smoke breaks that could help w/ pattern.   Plan: Return again in 2 weeks.  Diagnosis: MDD, GAD    [name], LPC 04/06/2015

## 2015-04-18 ENCOUNTER — Ambulatory Visit (HOSPITAL_COMMUNITY): Payer: Self-pay | Admitting: Psychology

## 2015-04-18 ENCOUNTER — Encounter (HOSPITAL_COMMUNITY): Payer: Self-pay | Admitting: Psychology

## 2015-04-19 ENCOUNTER — Encounter (HOSPITAL_COMMUNITY): Payer: Self-pay | Admitting: Psychology

## 2015-04-19 NOTE — Progress Notes (Signed)
[name] is a 35 y.o. female patient who didn't show for her appointment.  Letter sent.        [name], LPC

## 2015-05-02 ENCOUNTER — Ambulatory Visit (INDEPENDENT_AMBULATORY_CARE_PROVIDER_SITE_OTHER): Payer: Federal, State, Local not specified - PPO | Admitting: Psychology

## 2015-05-02 ENCOUNTER — Ambulatory Visit (HOSPITAL_COMMUNITY): Payer: Self-pay | Admitting: Psychology

## 2015-05-02 DIAGNOSIS — F331 Major depressive disorder, recurrent, moderate: Secondary | ICD-10-CM
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
## 2015-05-10 ENCOUNTER — Ambulatory Visit: Payer: Self-pay | Admitting: Family

## 2015-05-16 ENCOUNTER — Ambulatory Visit (INDEPENDENT_AMBULATORY_CARE_PROVIDER_SITE_OTHER): Payer: Federal, State, Local not specified - PPO | Admitting: Family

## 2015-05-16 ENCOUNTER — Other Ambulatory Visit (INDEPENDENT_AMBULATORY_CARE_PROVIDER_SITE_OTHER): Payer: Federal, State, Local not specified - PPO

## 2015-05-16 ENCOUNTER — Ambulatory Visit (HOSPITAL_COMMUNITY): Payer: Self-pay | Admitting: Psychology

## 2015-05-16 ENCOUNTER — Encounter: Payer: Self-pay | Admitting: Family

## 2015-05-16 VITALS — BP 118/86 | HR 72 | Temp 98.0°F | Resp 16 | Ht 63.0 in | Wt 265.0 lb

## 2015-05-16 DIAGNOSIS — Z8632 Personal history of gestational diabetes: Secondary | ICD-10-CM

## 2015-05-16 DIAGNOSIS — M79644 Pain in right finger(s): Secondary | ICD-10-CM | POA: Diagnosis not present

## 2015-05-16 DIAGNOSIS — J302 Other seasonal allergic rhinitis: Secondary | ICD-10-CM

## 2015-05-16 DIAGNOSIS — J309 Allergic rhinitis, unspecified: Secondary | ICD-10-CM | POA: Insufficient documentation

## 2015-05-16 LAB — HEMOGLOBIN A1C: Hgb A1c MFr Bld: 5.5 % (ref 4.6–6.5)

## 2015-05-16 MED ORDER — DICLOFENAC SODIUM 2 % TD SOLN: 1.0000 "application " | Freq: Two times a day (BID) | TRANSDERMAL | Status: DC | PRN

## 2015-05-16 NOTE — Progress Notes (Signed)
Subjective:    Patient ID: [name], female    DOB: July 18, 1980, 35 y.o.   MRN: [id number]  Chief Complaint  Patient presents with  . Allergies    thinks she has really bad allergies, 2 fingers are bothering her on right hand    HPI:  [name] is a 35 y.o. female who  has a past medical history of Depression; Cluster headaches; GERD (gastroesophageal reflux disease); Hyperlipidemia; Snoring; Anxiety; Eczema; Sciatica; Hypertension; and Gestational diabetes (2012, 2016). and presents today for an acute office visit.   1.) Seasonal allergies - associated symptoms of watery eyes, congestion, runny nose, worsen snoring and fatigue have been going on for approximately one month. Describes occasional scratchy throat and itchy skin one being outside. No history of seasonal allergies. No fevers. Course of the symptoms have remained the same. Denies any modifying factors/treatments in attempts to make it better.  2.) Hyperglycemia - previously noted to have hyperglycemia and in need of A1c since her delivery. Denies symptoms of polyphagia, polyuria, or polydipsia. Reports when she hasn't eaten she feel Shakey 2-3 times per week with a headache.  3.) Finger pain - associated symptoms of pain located in her right hand specifically in her fourth and fifth fingers hemming going on for the past several weeks. Middle finger is reportedly worse. Modifying factors include splinting her fingers. Describes the pain as a dull ache.  Allergies  Allergen Reactions  . Codeine     STOMACH CRAMPING  . Doxycycline Nausea And Vomiting     Current Outpatient Prescriptions on File Prior to Visit  Medication Sig Dispense Refill  . cyclobenzaprine (FLEXERIL) 10 MG tablet Take 1 tablet (10 mg total) by mouth 3 (three) times daily as needed for muscle spasms. 30 tablet 0  . diazepam (VALIUM) 5 MG tablet Take 1 tablet (5 mg total) by mouth every 6 (six) hours as needed for anxiety. 10 tablet 0  .  FLUoxetine (PROZAC) 20 MG tablet Take 2 tablets (40 mg total) by mouth daily. 60 tablet 1  . meloxicam (MOBIC) 15 MG tablet Take 1 tablet (15 mg total) by mouth daily. 30 tablet 2   No current facility-administered medications on file prior to visit.     Past Surgical History  Procedure Laterality Date  . Ganglion removal from (l) wrist  1998  . Cesarean section  8 & 11    x's 2  . Wisdom tooth extraction    . Colposcopy    . Dental surgery    . Cesarean section with bilateral tubal ligation Bilateral 12/18/2014    Procedure: REPEAT CESAREAN SECTION WITH BILATERAL TUBAL LIGATION;  Surgeon: Crawford Givens, MD;  Location: [facility];  Service: Obstetrics;  Laterality: Bilateral;  . Laparoscopic unilateral salpingectomy Left 01/26/2015    Procedure: LAPAROSCOPIC UNILATERAL SALPINGECTOMY;  Surgeon: Crawford Givens, MD;  Location: [facility];  Service: Gynecology;  Laterality: Left;    Past Medical History  Diagnosis Date  . Depression   . Cluster headaches   . GERD (gastroesophageal reflux disease)   . Hyperlipidemia   . Snoring     sleep study 02/2012 with min AHI events  . Anxiety   . Eczema   . Sciatica   . Hypertension     no meds currently  . Gestational diabetes 2012, 2016    Review of Systems  Constitutional: Positive for fatigue. Negative for fever and chills.  HENT: Positive for congestion, ear pain, rhinorrhea and sneezing. Negative for sore throat.  Eyes: Positive for itching.  Respiratory: Negative for cough, chest tightness and stridor.   Allergic/Immunologic: Positive for environmental allergies.  Neurological: Negative for headaches.      Objective:    BP 118/86 mmHg  Pulse 72  Temp(Src) 98 F (36.7 C) (Oral)  Resp 16  Ht 5\' 3"  (1.6 m)  Wt 265 lb (120.203 kg)  BMI 46.95 kg/m2  SpO2 98% Nursing note and vital signs reviewed.  Physical Exam  Constitutional: She is oriented to person, place, and time. She appears well-developed and well-nourished. No distress.   HENT:  Right Ear: Hearing, tympanic membrane, external ear and ear canal normal.  Left Ear: Hearing, tympanic membrane, external ear and ear canal normal.  Nose: Nose normal. Right sinus exhibits no maxillary sinus tenderness and no frontal sinus tenderness. Left sinus exhibits no maxillary sinus tenderness and no frontal sinus tenderness.  Mouth/Throat: Uvula is midline, oropharynx is clear and moist and mucous membranes are normal.  Cardiovascular: Normal rate, regular rhythm, normal heart sounds and intact distal pulses.   Pulmonary/Chest: Effort normal and breath sounds normal.  Musculoskeletal:  Right hand - no obvious deformity, discoloration, or edema noted. Mild tenderness along right fifth MCP joint. Range of motion within normal limits. Strength is within normal limits. Capillary refill is intact and appropriate.  Neurological: She is alert and oriented to person, place, and time.  Skin: Skin is warm and dry.  Psychiatric: She has a normal mood and affect. Her behavior is normal. Judgment and thought content normal.       Assessment & Plan:   Problem List Items Addressed This Visit      Respiratory   Allergic rhinitis - Primary    Symptoms and exam consistent with allergic rhinitis. Recommend over-the-counter second-generation antihistamines and nasal corticosteroids for symptom management. Follow-up if symptoms do not improve with new medication regimen.        Other   Hx gestational diabetes    Previous history of gestational diabetes and due for A1c to check current status for diabetes. Obtain A1c. Follow-up pending blood work if needed.      Relevant Orders   Hemoglobin A1c (Completed)   Finger pain, right    Right finger pain most likely from overuse and possible capsulitis. Continue current dosage of Mobic as needed for inflammation. Sample of Pennsaid provided. If symptoms worsen or do not improve consider cortisone injection and imaging with ultrasound.           I have discontinued Ms. Cull's CVS D3. I am also having her start on Diclofenac Sodium. Additionally, I am having her maintain her meloxicam, diazepam, cyclobenzaprine, and FLUoxetine.   Meds ordered this encounter  Medications  . Diclofenac Sodium (PENNSAID) 2 % SOLN    Sig: Place 1 application onto the skin 2 (two) times daily as needed.    Dispense:  16 g    Refill:  0    Order Specific Question:  Supervising Provider    Answer:  Pricilla Holm A [id number]     Follow-up: Return in about 1 month (around 06/16/2015), or if symptoms worsen or fail to improve.  Mauricio Po, FNP

## 2015-05-16 NOTE — Assessment & Plan Note (Signed)
Symptoms and exam consistent with allergic rhinitis. Recommend over-the-counter second-generation antihistamines and nasal corticosteroids for symptom management. Follow-up if symptoms do not improve with new medication regimen.

## 2015-05-16 NOTE — Progress Notes (Signed)
Pre visit review using our clinic review tool, if applicable. No additional management support is needed unless otherwise documented below in the visit note. 

## 2015-05-16 NOTE — Progress Notes (Deleted)
Subjective:     Patient ID: [name], female   DOB: 01-Apr-1980, 35 y.o.   MRN: [id number]  HPI 1. Allergies: Symptom onset around beginning of March. Complains of itching and watery eyes, congestion, runny nose, snoring worsened, feeling tired. Occasional scratchy throat.Skins itches after being outside. Has not previously experienced seasonal allergies. Left ear pain. Denies fever. Course of symptoms remains the same. Has not tried any modifying factors. Not breastfeeding.   2. Hyperglycemia: Has not followed up with A1C since delivery. Denies increased thirst, appetite or urination. Reports getting shaky 2-3 times per week when hasn't eaten recently with headache. Has not checked blood sugar since delivery. Denies end organ damage symptoms.  3. Finger pain: Reports smallest and middle finger pain of right hand over past several weeks. Middle finger getting worse. Modifying factors include splinting fingers and Ibuprophen. Reports some relief with these, but pain returns. Previous injury to small finger. Reports pain as dull on middle, and ache to small.   Review of Systems  Constitutional: Positive for fatigue. Negative for fever and appetite change.  HENT: Positive for congestion, ear pain, rhinorrhea and sneezing. Negative for hearing loss, nosebleeds, postnasal drip, sinus pressure, sore throat and trouble swallowing.        Left ear pain with yellow tinge drainage.  Eyes: Positive for discharge and itching. Negative for redness.       Watery d/c  Respiratory: Negative for cough, chest tightness and shortness of breath.   Cardiovascular: Negative.  Negative for chest pain and palpitations.  Endocrine: Negative for polydipsia, polyphagia and polyuria.  Allergic/Immunologic: Positive for environmental allergies.       Objective:   Physical Exam  Constitutional: She appears well-developed and well-nourished.  HENT:  Right Ear: External ear normal.  Left Ear: External ear normal.   Eyes: Right eye exhibits no discharge. Left eye exhibits no discharge.  Neck: No thyromegaly present.  Cardiovascular: Normal rate, regular rhythm and normal heart sounds.   Pulmonary/Chest: Effort normal and breath sounds normal.  Musculoskeletal: Normal range of motion.  Lymphadenopathy:    She has no cervical adenopathy.       Assessment:     ***    Plan:     ***

## 2015-05-16 NOTE — Patient Instructions (Addendum)
Thank you for choosing [facility].  Summary/Instructions:  Please start taking Xyzal, Claritin, Allegra or Zyrtec for your allergies and may also add Flonase, Nasocort, or Rhinocort.  For your hand - try ice after activity. Pennsaid 2x per day about 1/4 packet. Ibuprofen and Aleve as needed.   If symptoms worsen we will consider a cortisone injection.  Please stop by the lab on the basement level of the building for your blood work. Your results will be released to [facility] (or called to you) after review, usually within 72 hours after test completion. If any changes need to be made, you will be notified at that same time.  If your symptoms worsen or fail to improve, please contact our office for further instruction, or in case of emergency go directly to the emergency room at the closest medical facility.   General Recommendations:    Please drink plenty of fluids.  Get plenty of rest   Sleep in humidified air  Use saline nasal sprays  Netti pot   OTC Medications:  Decongestants - helps relieve congestion   Flonase (generic fluticasone) or Nasacort (generic triamcinolone) - please make sure to use the "cross-over" technique at a 45 degree angle towards the opposite eye as opposed to straight up the nasal passageway.   Sudafed (generic pseudoephedrine - Note this is the one that is available behind the pharmacy counter); Products with phenylephrine (-PE) may also be used but is often not as effective as pseudoephedrine.   If you have HIGH BLOOD PRESSURE - Coricidin HBP; AVOID any product that is -D as this contains pseudoephedrine which may increase your blood pressure.  Afrin (oxymetazoline) every 6-8 hours for up to 3 days.   Allergies - helps relieve runny nose, itchy eyes and sneezing   Claritin (generic loratidine), Allegra (fexofenidine), or Zyrtec (generic cyrterizine) for runny nose. These medications should not cause drowsiness.  Note - Benadryl (generic  diphenhydramine) may be used however may cause drowsiness  Cough -   Delsym or Robitussin (generic dextromethorphan)  Expectorants - helps loosen mucus to ease removal   Mucinex (generic guaifenesin) as directed on the package.  Headaches / General Aches   Tylenol (generic acetaminophen) - DO NOT EXCEED 3 grams (3,000 mg) in a 24 hour time period  Advil/Motrin (generic ibuprofen)   Sore Throat -   Salt water gargle   Chloraseptic (generic benzocaine) spray or lozenges / Sucrets (generic dyclonine)

## 2015-05-16 NOTE — Assessment & Plan Note (Signed)
Previous history of gestational diabetes and due for A1c to check current status for diabetes. Obtain A1c. Follow-up pending blood work if needed.

## 2015-05-16 NOTE — Assessment & Plan Note (Signed)
Right finger pain most likely from overuse and possible capsulitis. Continue current dosage of Mobic as needed for inflammation. Sample of Pennsaid provided. If symptoms worsen or do not improve consider cortisone injection and imaging with ultrasound.

## 2015-05-17 ENCOUNTER — Ambulatory Visit (HOSPITAL_COMMUNITY): Payer: Self-pay | Admitting: Psychiatry

## 2015-05-30 ENCOUNTER — Encounter (HOSPITAL_COMMUNITY): Payer: Self-pay | Admitting: Psychology

## 2015-05-30 ENCOUNTER — Ambulatory Visit (HOSPITAL_COMMUNITY): Payer: Self-pay | Admitting: Psychology

## 2015-05-30 NOTE — Progress Notes (Signed)
[name] is a 35 y.o. female patient who called today to inform front office staff that she couldn't make appointment. Pt next appointment is on 06/09/15.        [name], LPC

## 2015-06-09 ENCOUNTER — Ambulatory Visit (INDEPENDENT_AMBULATORY_CARE_PROVIDER_SITE_OTHER): Payer: Federal, State, Local not specified - PPO | Admitting: Psychology

## 2015-06-09 DIAGNOSIS — F330 Major depressive disorder, recurrent, mild: Secondary | ICD-10-CM | POA: Diagnosis not present

## 2015-06-09 DIAGNOSIS — F411 Generalized anxiety disorder: Secondary | ICD-10-CM | POA: Diagnosis not present
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
## 2015-06-13 DIAGNOSIS — K080 Exfoliation of teeth due to systemic causes: Secondary | ICD-10-CM | POA: Diagnosis not present

## 2015-06-16 ENCOUNTER — Encounter (HOSPITAL_COMMUNITY): Payer: Self-pay | Admitting: Psychiatry

## 2015-06-16 ENCOUNTER — Ambulatory Visit (INDEPENDENT_AMBULATORY_CARE_PROVIDER_SITE_OTHER): Payer: Federal, State, Local not specified - PPO | Admitting: Psychiatry

## 2015-06-16 VITALS — BP 126/76 | HR 78 | Ht 62.0 in | Wt 275.8 lb

## 2015-06-16 DIAGNOSIS — F332 Major depressive disorder, recurrent severe without psychotic features: Secondary | ICD-10-CM

## 2015-06-16 DIAGNOSIS — F4010 Social phobia, unspecified: Secondary | ICD-10-CM

## 2015-06-16 DIAGNOSIS — F411 Generalized anxiety disorder: Secondary | ICD-10-CM

## 2015-06-16 MED ORDER — FLUOXETINE HCL 20 MG PO TABS: 40.0000 mg | ORAL_TABLET | Freq: Every day | ORAL | Status: DC
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
## 2015-06-22 ENCOUNTER — Ambulatory Visit (INDEPENDENT_AMBULATORY_CARE_PROVIDER_SITE_OTHER): Payer: Federal, State, Local not specified - PPO | Admitting: Psychology

## 2015-06-22 DIAGNOSIS — F331 Major depressive disorder, recurrent, moderate: Secondary | ICD-10-CM | POA: Diagnosis not present

## 2015-06-22 DIAGNOSIS — F411 Generalized anxiety disorder: Secondary | ICD-10-CM

## 2015-06-22 NOTE — Progress Notes (Signed)
   THERAPIST PROGRESS NOTE  Session Time: 8.07am-8.55am  Participation Level: Active  Behavioral Response: Well GroomedAlerttired  Type of Therapy: Individual Therapy  Treatment Goals addressed: Diagnosis: MDD and goal 1  Interventions: CBT and Strength-based  Summary: [name] is a 35 y.o. female who presents with fatigue.  Pt reported she was feeling tired this morning- but unsure why- hour more sleep w/ kids out of school and baby sleeping well.  She did note no coffee yet this morning.  Pt reported that she is feeling ok some days, some days down.  Pt reported that recent loss of close coworker that has young kids- so relates to. Pt reports that she has been focused on gratitude of her family and enjoying time w/ them.  Pt discussed helping her w/ coping and sadness that misses her family of origin who has strained relationship w/ at this time.    Suicidal/Homicidal: Nowithout intent/plan  Therapist Response: Assessed pt current functioning per pt report.  Processed w/pt her mood and contributing factors- assisted pt w/ focus on gratitude and benefit of this.    Plan: Return again in 2 weeks.  Diagnosis: MDD   [name], [facility] 06/22/2015

## 2015-07-11 ENCOUNTER — Ambulatory Visit (HOSPITAL_COMMUNITY): Payer: Self-pay | Admitting: Psychology

## 2015-07-20 DIAGNOSIS — Z01419 Encounter for gynecological examination (general) (routine) without abnormal findings: Secondary | ICD-10-CM | POA: Diagnosis not present

## 2015-07-20 DIAGNOSIS — N926 Irregular menstruation, unspecified: Secondary | ICD-10-CM | POA: Diagnosis not present

## 2015-07-25 ENCOUNTER — Ambulatory Visit (INDEPENDENT_AMBULATORY_CARE_PROVIDER_SITE_OTHER): Payer: Federal, State, Local not specified - PPO | Admitting: Psychology

## 2015-07-25 DIAGNOSIS — F331 Major depressive disorder, recurrent, moderate: Secondary | ICD-10-CM

## 2015-07-25 NOTE — Progress Notes (Signed)
   THERAPIST PROGRESS NOTE  Session Time: 3.30pm-4.23pm  Participation Level: Active  Behavioral Response: Well GroomedAlertDepressed  Type of Therapy: Individual Therapy  Treatment Goals addressed: Diagnosis: MDD and goal 1.'  Interventions: CBT and Assertiveness Training  Summary: [name] is a 35 y.o. female who presents with affect congruent w/ report of irritability.  Pt presents in lobby w/ her husband and 3 children.  Pt reported that she is angry right now and reported that husband and her have been arguing over the past hour.  Pt reported that they went to look at housing at their old apartment complex and when she stated that wasn't option as increased their price- husband took offense that she didn't agree w/ his idea.  Pt was able to identify contributing factors to conflict and identify that she struggles w/ assertive communication although didn't intend to suggest that wasn't willing to move and downsize.  Pt would just like to look at other options.  Pt was able to identify that she will feel hurt longer by argument as well and ways she can communicate her thoughts and feelings.  Pt reported that she has just started on birth control to assist w/ potentially helping irritability she is feeling.  Pt also discussed need to retry mouth piece to assist w/ sleep disorder as feeling constantly fatigued.     Suicidal/Homicidal: Nowithout intent/plan  Therapist Response: Assessed pt current functioning per pt report. Processed w/pt conflict w/ husband and assisted in identifying contributing factors and different prospectives.  Explored conflict resolution and discussed assertive communication skills.   Plan: Return again in 2 weeks.  Diagnosis: MDD   [name], [facility] 07/25/2015

## 2015-08-08 ENCOUNTER — Ambulatory Visit (HOSPITAL_COMMUNITY): Payer: Self-pay | Admitting: Psychology

## 2015-08-12 ENCOUNTER — Ambulatory Visit (INDEPENDENT_AMBULATORY_CARE_PROVIDER_SITE_OTHER): Payer: Federal, State, Local not specified - PPO | Admitting: Psychology

## 2015-08-12 DIAGNOSIS — F330 Major depressive disorder, recurrent, mild: Secondary | ICD-10-CM

## 2015-08-12 NOTE — Progress Notes (Signed)
   THERAPIST PROGRESS NOTE  Session Time: 8.04am-8.54am  Participation Level: Active  Behavioral Response: Well GroomedAlertaffect WNL- but tired- yawning frequent in session.  Type of Therapy: Individual Therapy  Treatment Goals addressed: Diagnosis: MDd and goal 1  Interventions: CBT and Supportive  Summary: [name] is a 35 y.o. female who presents with report of mood good- less depressed- some irritability and always tired.  Pt reports that isn't getting good sleep as waking often w/ infant who isn't sleeping through the night. Pt reports that her sister-in law is in the hospital on bed rest as water broke at 26 weeks.  Pt reports that she did reach out to brother- first attempt for contact since conflict w/ him couple months ago.  pt reported that he responded he would call back later and hasn't- but isn't taken personal as aware handling a lot currently.  Pt reported that sister is interacting like conflict all resolved and pt not sure if to engage like normal or if this will result in not change.  Pt reported that she is focused on being grateful for things that have and relationship even amidst financial struggles. Pt also reports decreased loss of interest and motivation for things she enjoys.    Suicidal/Homicidal: Nowithout intent/plan  Therapist Response: Assessed pt current functioning per pt report.  Processed w/pt her effect sleeping having on mood irritability.  Explored w/pt interactions w/ family and conflict resolution.  Processed w/pt positive effect of gratitude.   Plan: Return again in 2 weeks.  Diagnosis: MDD    [name], [facility] 08/12/2015

## 2015-08-23 ENCOUNTER — Ambulatory Visit (HOSPITAL_COMMUNITY): Payer: Self-pay | Admitting: Psychiatry

## 2015-08-25 DIAGNOSIS — M24851 Other specific joint derangements of right hip, not elsewhere classified: Secondary | ICD-10-CM | POA: Diagnosis not present

## 2015-08-25 DIAGNOSIS — M2241 Chondromalacia patellae, right knee: Secondary | ICD-10-CM | POA: Diagnosis not present

## 2015-08-29 DIAGNOSIS — M25551 Pain in right hip: Secondary | ICD-10-CM | POA: Diagnosis not present

## 2015-08-29 DIAGNOSIS — M25561 Pain in right knee: Secondary | ICD-10-CM | POA: Diagnosis not present

## 2015-09-01 ENCOUNTER — Ambulatory Visit (HOSPITAL_COMMUNITY): Payer: Self-pay | Admitting: Psychiatry

## 2015-09-05 ENCOUNTER — Ambulatory Visit (HOSPITAL_COMMUNITY): Payer: Self-pay | Admitting: Psychology

## 2015-09-05 ENCOUNTER — Encounter (HOSPITAL_COMMUNITY): Payer: Self-pay | Admitting: Psychology

## 2015-09-05 NOTE — Progress Notes (Signed)
[name] is a 35 y.o. female patient who called this morning and informed unable to make afternoon appointment.  Pt has f/u appointments scheduled. Marland Kitchen        [name], LPC

## 2015-09-08 ENCOUNTER — Ambulatory Visit (INDEPENDENT_AMBULATORY_CARE_PROVIDER_SITE_OTHER): Payer: Federal, State, Local not specified - PPO | Admitting: Psychiatry

## 2015-09-08 ENCOUNTER — Encounter (HOSPITAL_COMMUNITY): Payer: Self-pay | Admitting: Psychiatry

## 2015-09-08 DIAGNOSIS — F332 Major depressive disorder, recurrent severe without psychotic features: Secondary | ICD-10-CM

## 2015-09-08 DIAGNOSIS — F411 Generalized anxiety disorder: Secondary | ICD-10-CM

## 2015-09-08 DIAGNOSIS — F4010 Social phobia, unspecified: Secondary | ICD-10-CM | POA: Diagnosis not present

## 2015-09-08 MED ORDER — FLUOXETINE HCL 60 MG PO TABS: 60.0000 mg | ORAL_TABLET | Freq: Every day | ORAL | 3 refills | Status: DC
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
## 2015-09-13 ENCOUNTER — Ambulatory Visit (INDEPENDENT_AMBULATORY_CARE_PROVIDER_SITE_OTHER): Payer: Federal, State, Local not specified - PPO | Admitting: Internal Medicine

## 2015-09-13 ENCOUNTER — Encounter: Payer: Self-pay | Admitting: Internal Medicine

## 2015-09-13 DIAGNOSIS — M79672 Pain in left foot: Secondary | ICD-10-CM

## 2015-09-13 MED ORDER — TRAMADOL HCL 50 MG PO TABS: 50.0000 mg | ORAL_TABLET | Freq: Three times a day (TID) | ORAL | 0 refills | Status: DC | PRN

## 2015-09-13 NOTE — Progress Notes (Signed)
Pre visit review using our clinic review tool, if applicable. No additional management support is needed unless otherwise documented below in the visit note. 

## 2015-09-13 NOTE — Assessment & Plan Note (Signed)
Exam c/w prob marked tender ganglion cyst, likely related to underlying tendonitis and morbid obesity with walking;  Pt has appt tomorrow with podiatry so this would be the best in f/u, may do well with steroid shot, ok for tramadol prn pain now

## 2015-09-13 NOTE — Patient Instructions (Signed)
Please take all new medication as prescribed - the pain medication as needed  Please see your podiatrist as you have planned  Please continue all other medications as before, and refills have been done if requested.  Please have the pharmacy call with any other refills you may need.  Please keep your appointments with your specialists as you may have planned

## 2015-09-13 NOTE — Progress Notes (Signed)
Subjective:    Patient ID: [name], female    DOB: Nov 01, 1980, 35 y.o.   MRN: [id number]  HPI  Here with c/o marked 2 days onset left mid plantar/arch pain with some mild swelling, hurts in a specific localized place, but no injury, ulcer, wound, fever, drainage.  Constant, very difficult to walk, has a limp. No falls.  Has hx of ganglion cyst to left wrist, kind of seems like that Past Medical History:  Diagnosis Date  . Anxiety   . Cluster headaches   . Depression   . Eczema   . GERD (gastroesophageal reflux disease)   . Gestational diabetes 2012, 2016  . Hyperlipidemia   . Hypertension    no meds currently  . Sciatica   . Snoring    sleep study 02/2012 with min AHI events   Past Surgical History:  Procedure Laterality Date  . CESAREAN SECTION  09 & 12   x's 2  . CESAREAN SECTION WITH BILATERAL TUBAL LIGATION Bilateral 12/18/2014   Procedure: REPEAT CESAREAN SECTION WITH BILATERAL TUBAL LIGATION;  Surgeon: Crawford Givens, MD;  Location: [facility];  Service: Obstetrics;  Laterality: Bilateral;  . COLPOSCOPY    . DENTAL SURGERY    . Ganglion removal from (L) wrist  1998  . LAPAROSCOPIC UNILATERAL SALPINGECTOMY Left 01/26/2015   Procedure: LAPAROSCOPIC UNILATERAL SALPINGECTOMY;  Surgeon: Crawford Givens, MD;  Location: [facility];  Service: Gynecology;  Laterality: Left;  . WISDOM TOOTH EXTRACTION      reports that she quit smoking about 16 months ago. Her smoking use included Cigarettes. She has a 4.95 pack-year smoking history. She has never used smokeless tobacco. She reports that she drinks alcohol. She reports that she does not use drugs. family history includes Alcohol abuse in her father, mother, and other; Anxiety disorder in her mother and sister; Arthritis in her father, mother, and other; Breast cancer in her paternal aunt; Cancer (age of onset: 84) in her mother; Coronary artery disease (age of onset: 7) in her mother; Depression in her mother; Diabetes in her maternal  grandmother; Drug abuse in her father; Heart disease in her father and maternal grandmother; Hyperlipidemia in her father and paternal grandfather; Hypertension in her paternal grandfather; Kidney disease in her father; Mental illness in her father and mother; Stroke in her father and maternal grandfather. Allergies  Allergen Reactions  . Codeine     STOMACH CRAMPING  . Doxycycline Nausea And Vomiting   Current Outpatient Prescriptions on File Prior to Visit  Medication Sig Dispense Refill  . cyclobenzaprine (FLEXERIL) 10 MG tablet Take 1 tablet (10 mg total) by mouth 3 (three) times daily as needed for muscle spasms. 30 tablet 0  . diazepam (VALIUM) 5 MG tablet Take 1 tablet (5 mg total) by mouth every 6 (six) hours as needed for anxiety. 10 tablet 0  . Diclofenac Sodium (PENNSAID) 2 % SOLN Place 1 application onto the skin 2 (two) times daily as needed. 16 g 0  . FLUoxetine 60 MG TABS Take 60 mg by mouth daily. 30 tablet 3  . meloxicam (MOBIC) 15 MG tablet Take 1 tablet (15 mg total) by mouth daily. 30 tablet 2  . norgestrel-ethinyl estradiol (LO/OVRAL,CRYSELLE) 0.3-30 MG-MCG tablet Take 1 tablet by mouth daily.     No current facility-administered medications on file prior to visit.    Review of Systems All otherwise neg per pt     Objective:   Physical Exam BP 130/72   Pulse 98  Temp 98.4 F (36.9 C) (Oral)   Resp 20   Wt 281 lb (127.5 kg)   SpO2 97%   BMI 51.40 kg/m  VS noted,  Constitutional: Pt appears in no apparent distress HENT: Head: NCAT.  Right Ear: External ear normal.  Left Ear: External ear normal.  Eyes: . Pupils are equal, round, and reactive to light. Conjunctivae and EOM are normal Neck: Normal range of motion. Neck supple.  Cardiovascular: Normal rate and regular rhythm.   Pulmonary/Chest: Effort normal and breath sounds without rales or wheezing.  Neurological: Pt is alert. Not confused , motor grossly intact Skin: Skin is warm. No rash, no LE  edema Psychiatric: Pt behavior is normal. No agitation.  Left plantar foot with mid arch medial aspect area of 1cm sub firm mass with marked tender and mild surrounding swelling, without fluctucance, drainage or any overlying skin change, foot o/w neurovasc intact       Assessment & Plan:

## 2015-09-15 DIAGNOSIS — M722 Plantar fascial fibromatosis: Secondary | ICD-10-CM | POA: Diagnosis not present

## 2015-09-19 ENCOUNTER — Ambulatory Visit (INDEPENDENT_AMBULATORY_CARE_PROVIDER_SITE_OTHER): Payer: Federal, State, Local not specified - PPO | Admitting: Psychology

## 2015-09-19 ENCOUNTER — Ambulatory Visit (HOSPITAL_COMMUNITY): Payer: Self-pay | Admitting: Psychology

## 2015-09-19 DIAGNOSIS — F330 Major depressive disorder, recurrent, mild: Secondary | ICD-10-CM | POA: Diagnosis not present

## 2015-09-26 NOTE — Progress Notes (Signed)
   THERAPIST PROGRESS NOTE  Session Time: 8.15am-9am  Participation Level: Active  Behavioral Response: Well GroomedAlertaffect wnl  Type of Therapy: Individual Therapy  Treatment Goals addressed: Diagnosis: MDD an dgaol 1  Interventions: CBT and Supportive  Summary: [name] is a 35 y.o. female who presents with generally full and bright affect.  Pt reports that she has been doing well w/ mood- still struggles w/ fatigue and getting good sleep w/ baby in the home.  Pt reported that her niece was born premature and has been in contact w/ her sister-in-law- supportive. Pt reports dad attempted to contact her and she kept good boundaries.  Pt reports getting into routine w/ children back in school.  Pt reported that they are looking into moving into rental home nearby.  Pt discussed positive of this move for her and family. Pt overall positive and able to making healthy statements for self.   Suicidal/Homicidal: Nowithout intent/plan  Therapist Response: Assessed pt current functioning per pt report. Processed w/ pt mood and transition of schedules w/ kids in school.  Explored w/pt healthy boundaries setting w/ negative relationships.  Discussed potential of move and benefits of.  Reflected positive thought processes.    Plan: Return again in 2 weeks.  Diagnosis: MDD    [name], [facility] 09/26/2015

## 2015-09-29 DIAGNOSIS — M722 Plantar fascial fibromatosis: Secondary | ICD-10-CM | POA: Diagnosis not present

## 2015-10-03 ENCOUNTER — Ambulatory Visit (HOSPITAL_COMMUNITY): Payer: Self-pay | Admitting: Psychology

## 2015-10-17 ENCOUNTER — Encounter (HOSPITAL_COMMUNITY): Payer: Self-pay | Admitting: Psychology

## 2015-10-17 ENCOUNTER — Ambulatory Visit (HOSPITAL_COMMUNITY): Payer: Self-pay | Admitting: Psychology

## 2015-10-17 NOTE — Progress Notes (Signed)
[name] is a 35 y.o. female patient who didn't show for appointment.  Letter sent.        [name], LPC

## 2015-10-19 ENCOUNTER — Other Ambulatory Visit (HOSPITAL_COMMUNITY): Payer: Self-pay | Admitting: Psychiatry

## 2015-10-19 ENCOUNTER — Emergency Department (HOSPITAL_COMMUNITY): Payer: Federal, State, Local not specified - PPO

## 2015-10-19 ENCOUNTER — Emergency Department (HOSPITAL_COMMUNITY)
Admission: EM | Admit: 2015-10-19 | Discharge: 2015-10-19 | Disposition: A | Discharge status: Home / Self Care | Payer: Federal, State, Local not specified - PPO | Attending: Emergency Medicine | Admitting: Emergency Medicine

## 2015-10-19 ENCOUNTER — Encounter (HOSPITAL_COMMUNITY): Payer: Self-pay | Admitting: *Deleted

## 2015-10-19 DIAGNOSIS — R0789 Other chest pain: Secondary | ICD-10-CM | POA: Insufficient documentation

## 2015-10-19 DIAGNOSIS — I10 Essential (primary) hypertension: Secondary | ICD-10-CM | POA: Diagnosis not present

## 2015-10-19 DIAGNOSIS — R072 Precordial pain: Secondary | ICD-10-CM | POA: Diagnosis not present

## 2015-10-19 DIAGNOSIS — F4010 Social phobia, unspecified: Secondary | ICD-10-CM

## 2015-10-19 DIAGNOSIS — Z87891 Personal history of nicotine dependence: Secondary | ICD-10-CM | POA: Diagnosis not present

## 2015-10-19 DIAGNOSIS — R079 Chest pain, unspecified: Secondary | ICD-10-CM | POA: Diagnosis not present

## 2015-10-19 DIAGNOSIS — F411 Generalized anxiety disorder: Secondary | ICD-10-CM

## 2015-10-19 DIAGNOSIS — R0602 Shortness of breath: Secondary | ICD-10-CM | POA: Diagnosis not present

## 2015-10-19 DIAGNOSIS — F332 Major depressive disorder, recurrent severe without psychotic features: Secondary | ICD-10-CM

## 2015-10-19 LAB — CBC
HEMATOCRIT: 40.7 % (ref 36.0–46.0)
Hemoglobin: 13.2 g/dL (ref 12.0–15.0)
MCH: 29.3 pg (ref 26.0–34.0)
MCHC: 32.4 g/dL (ref 30.0–36.0)
MCV: 90.2 fL (ref 78.0–100.0)
PLATELETS: 279 10*3/uL (ref 150–400)
RBC: 4.51 MIL/uL (ref 3.87–5.11)
RDW: 13.1 % (ref 11.5–15.5)
WBC: 6.9 10*3/uL (ref 4.0–10.5)

## 2015-10-19 LAB — BASIC METABOLIC PANEL
Anion gap: 7 (ref 5–15)
BUN: 11 mg/dL (ref 6–20)
CHLORIDE: 106 mmol/L (ref 101–111)
CO2: 24 mmol/L (ref 22–32)
CREATININE: 0.85 mg/dL (ref 0.44–1.00)
Calcium: 9.1 mg/dL (ref 8.9–10.3)
GFR calc non Af Amer: >60 mL/min (ref >60)
Glucose, Bld: 84 mg/dL (ref 65–99)
POTASSIUM: 3.9 mmol/L (ref 3.5–5.1)
SODIUM: 137 mmol/L (ref 135–145)

## 2015-10-19 LAB — D-DIMER, QUANTITATIVE (NOT AT ARMC): D DIMER QUANT: 0.46 ug{FEU}/mL (ref 0.00–0.50)

## 2015-10-19 LAB — I-STAT TROPONIN, ED: Troponin i, poc: 0 ng/mL (ref 0.00–0.08)

## 2015-10-19 IMAGING — CR DG CHEST 2V
2 series · 2 of 2 positions shown · non-contrast
Comparison: PA and lateral chest x-ray [DATE]

CLINICAL DATA: Chest pain, arm tingling with onset of symptoms and
[DATE] a.m. today. Some shortness of breath.

EXAM:
CHEST  2 VIEW

[w chest pa]
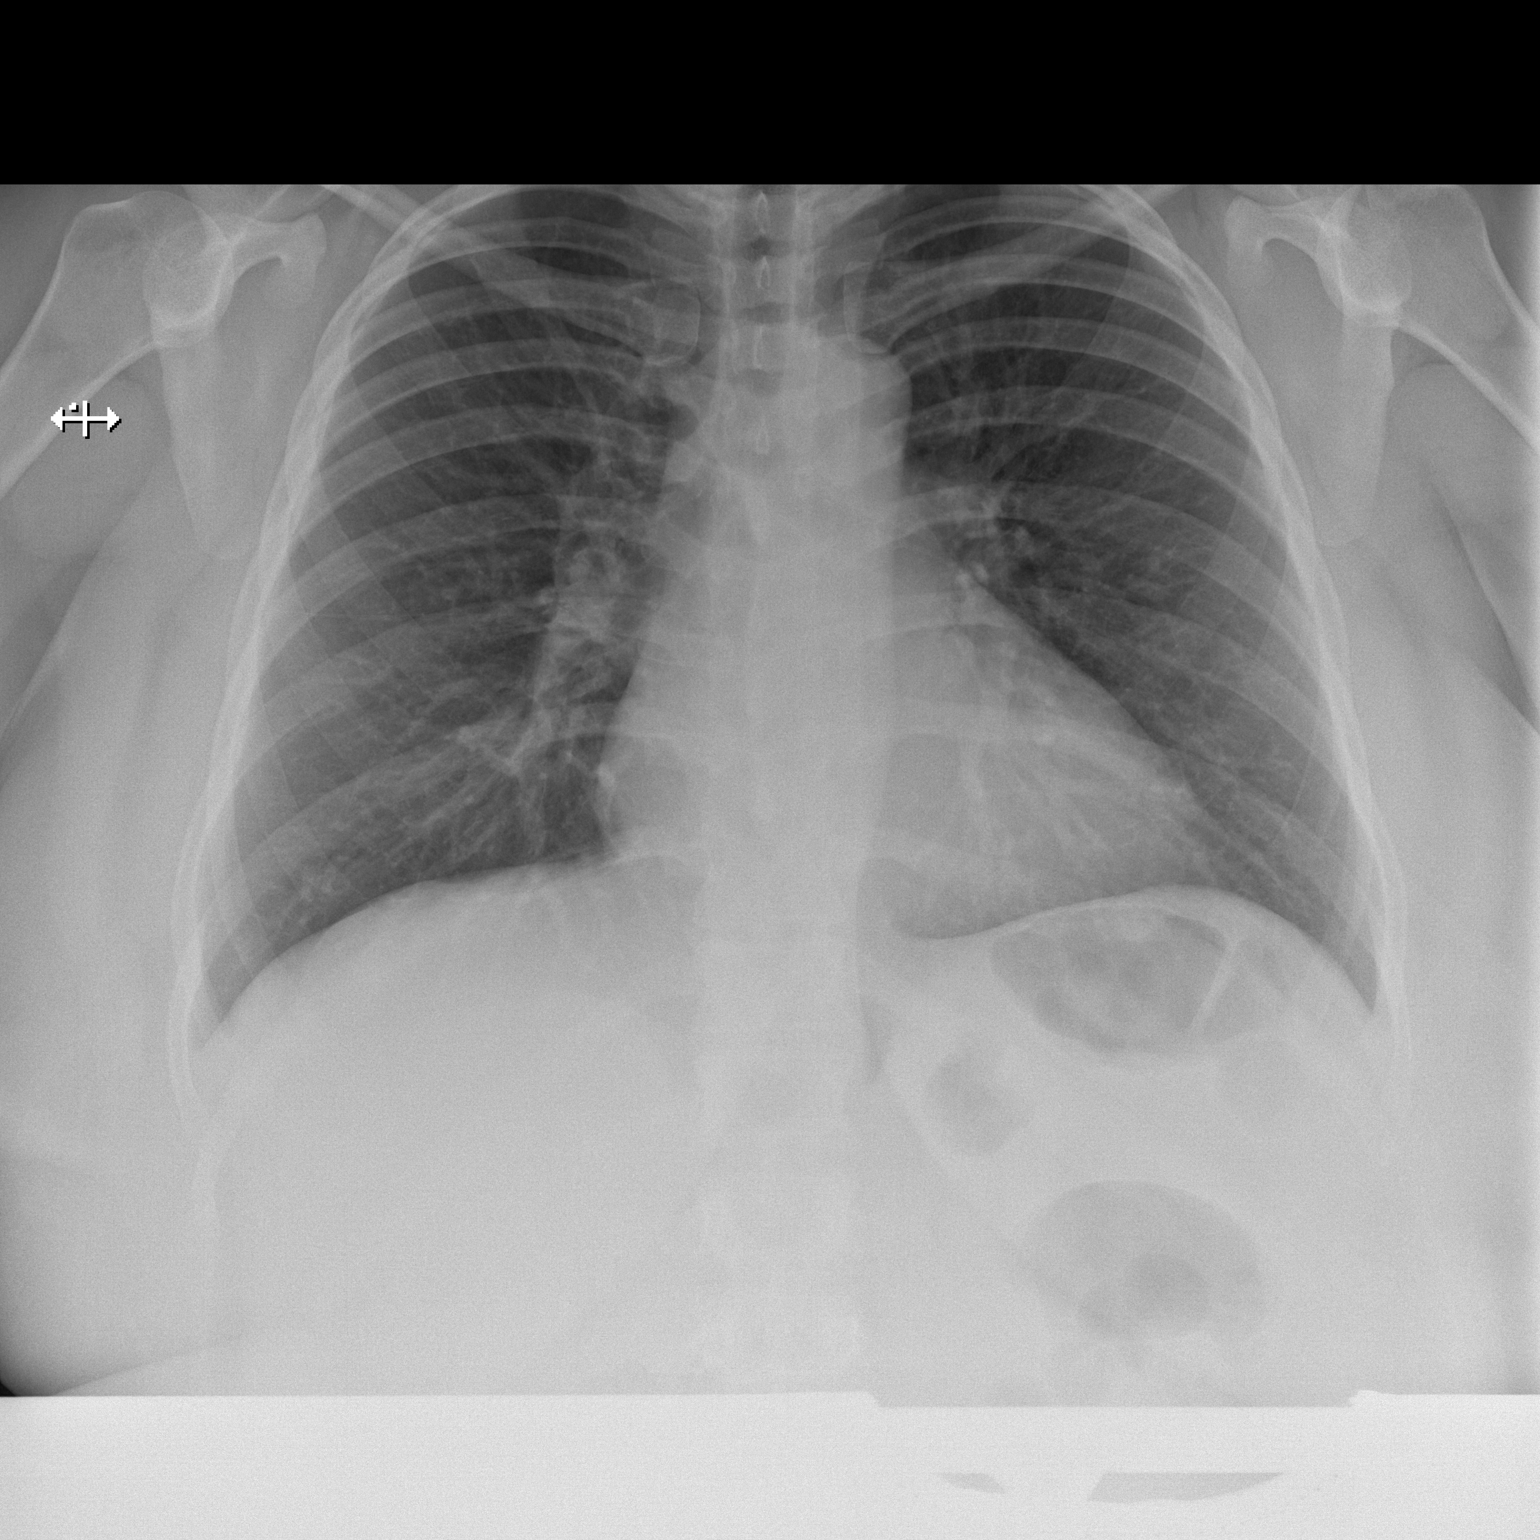

[w chest lat]
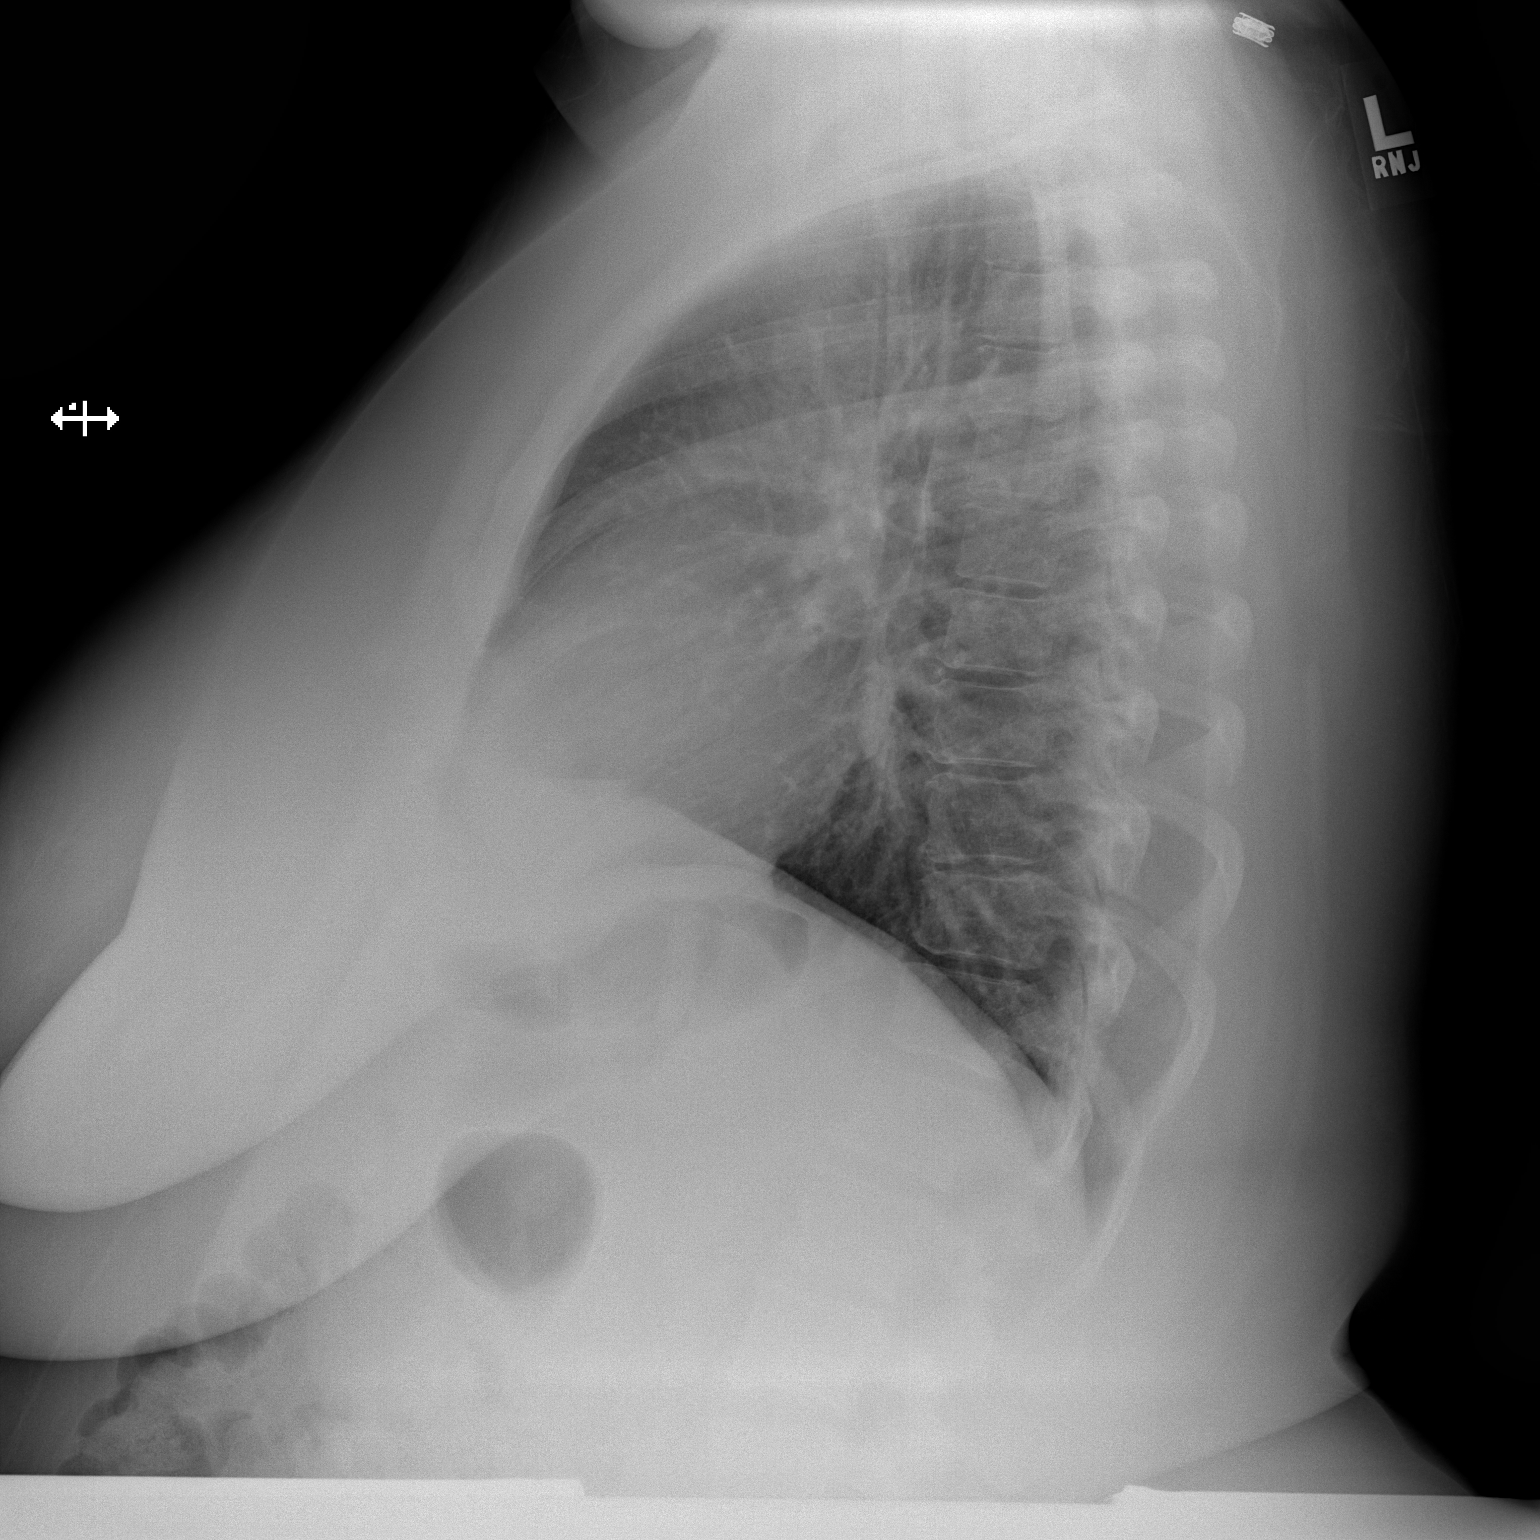

[2 of 2 positions shown; findings below may reference images not displayed]

FINDINGS: The lungs are adequately inflated. There is no focal infiltrate.
There is no pleural effusion. The heart and pulmonary vascularity
are normal. The mediastinum is normal in width. The bony thorax is
unremarkable.
IMPRESSION: There is no active cardiopulmonary disease.

## 2015-10-19 MED ORDER — GI COCKTAIL ~~LOC~~
30.0000 mL | Freq: Once | ORAL | Status: AC
  Administered 2015-10-19: 30 mL via ORAL
  Filled 2015-10-19: qty 30

## 2015-10-19 NOTE — ED Provider Notes (Signed)
[facility] DEPT Provider Note   CSN: [id number] Arrival date & time: 10/19/15  1303     History   Chief Complaint Chief Complaint  Patient presents with  . Chest Pain    HPI [name] is a 35 y.o. female.  35 yo F with a chief complaint of chest pain. This been off and on for the past few days. Patient states that it's sharp and shooting last only for a few minutes and then resolves. Denies any exertional symptoms. He has had some nausea denies vomiting. Denies fevers or chills denies cough or congestion. Denies hemoptysis leg swelling recent travel. She is on birth control pills.   The history is provided by the patient.  Chest Pain   This is a new problem. The current episode started more than 1 week ago. The problem occurs every several days. The problem has not changed since onset.The pain is present in the substernal region. The pain is at a severity of 7/10. The pain is moderate. Associated symptoms include dizziness, nausea, near-syncope and shortness of breath. Pertinent negatives include no fever, no headaches, no palpitations and no vomiting. She has tried nothing for the symptoms. The treatment provided no relief. Risk factors include oral contraceptive use.  Pertinent negatives for past medical history include no CAD, no MI and no PE.  Her family medical history is significant for early MI (1 in father).    Past Medical History:  Diagnosis Date  . Anxiety   . Cluster headaches   . Depression   . Eczema   . GERD (gastroesophageal reflux disease)   . Gestational diabetes 2012, 2016  . Hyperlipidemia   . Hypertension    no meds currently  . Sciatica   . Snoring    sleep study 02/2012 with min AHI events    Patient Active Problem List   Diagnosis Date Noted  . Foot pain, left 09/13/2015  . Allergic rhinitis 05/16/2015  . Finger pain, right 05/16/2015  . S/P C-section 12/18/2014  . Hyperlipidemia 12/16/2014  . Gestational diabetes mellitus (GDM)  treated with oral hypoglycemic therapy 12/16/2014  . Hx of herpes simplex infection 11/29/2014  . Major depressive disorder, recurrent, severe without psychotic features ([place]) 09/23/2014  . GAD (generalized anxiety disorder) 09/23/2014  . Social anxiety disorder 09/23/2014  . Routine general medical examination at a health care facility 01/07/2014  . Upper airway resistance syndrome 11/26/2011  . Obesity 05/17/2011  . Right knee pain 02/16/2011  . Depression 02/16/2011  . Hx gestational diabetes 02/16/2011  . Eczema 02/16/2011    Past Surgical History:  Procedure Laterality Date  . CESAREAN SECTION  09 & 12   x's 2  . CESAREAN SECTION WITH BILATERAL TUBAL LIGATION Bilateral 12/18/2014   Procedure: REPEAT CESAREAN SECTION WITH BILATERAL TUBAL LIGATION;  Surgeon: Crawford Givens, MD;  Location: [facility];  Service: Obstetrics;  Laterality: Bilateral;  . COLPOSCOPY    . DENTAL SURGERY    . Ganglion removal from (L) wrist  1998  . LAPAROSCOPIC UNILATERAL SALPINGECTOMY Left 01/26/2015   Procedure: LAPAROSCOPIC UNILATERAL SALPINGECTOMY;  Surgeon: Crawford Givens, MD;  Location: [facility];  Service: Gynecology;  Laterality: Left;  . WISDOM TOOTH EXTRACTION      OB History    Gravida Para Term Preterm AB Living   3 3 1     3    SAB TAB Ectopic Multiple Live Births         0 3       Home Medications  Prior to Admission medications   Medication Sig Start Date End Date Taking? Authorizing Provider  cyclobenzaprine (FLEXERIL) 10 MG tablet Take 1 tablet (10 mg total) by mouth 3 (three) times daily as needed for muscle spasms. 02/09/15  Yes Golden Circle, FNP  diazepam (VALIUM) 5 MG tablet Take 1 tablet (5 mg total) by mouth every 6 (six) hours as needed for anxiety. 02/09/15  Yes Golden Circle, FNP  FLUoxetine (PROZAC) 20 MG tablet Take 40 mg by mouth every morning. 09/17/15  Yes Historical Provider, MD  ibuprofen (ADVIL,MOTRIN) 600 MG tablet Take 600 mg by mouth every 6 (six) hours as needed  for headache, mild pain or moderate pain.   Yes Historical Provider, MD  JUNEL FE 1/20 1-20 MG-MCG tablet Take 1 tablet by mouth every morning. 10/18/15  Yes Historical Provider, MD  meloxicam (MOBIC) 15 MG tablet Take 1 tablet (15 mg total) by mouth daily. Patient taking differently: Take 15 mg by mouth daily as needed for pain.  02/09/15  Yes Golden Circle, FNP  Diclofenac Sodium (PENNSAID) 2 % SOLN Place 1 application onto the skin 2 (two) times daily as needed. Patient not taking: Reported on 10/19/2015 05/16/15   Golden Circle, FNP  FLUoxetine 60 MG TABS Take 60 mg by mouth daily. Patient not taking: Reported on 10/19/2015 09/08/15   Charlcie Cradle, MD  traMADol (ULTRAM) 50 MG tablet Take 1 tablet (50 mg total) by mouth every 8 (eight) hours as needed. Patient not taking: Reported on 10/19/2015 09/13/15   Biagio Borg, MD    Family History Family History  Problem Relation Age of Onset  . Arthritis Mother   . Alcohol abuse Mother   . Mental illness Mother   . Cancer Mother 92    squamous - unknown primary  . Coronary artery disease Mother 36    MI with stent  . Anxiety disorder Mother   . Depression Mother   . Arthritis Father   . Alcohol abuse Father   . Hyperlipidemia Father   . Heart disease Father   . Kidney disease Father   . Stroke Father   . Mental illness Father   . Drug abuse Father   . Diabetes Maternal Grandmother   . Heart disease Maternal Grandmother   . Stroke Maternal Grandfather   . Hypertension Paternal Grandfather   . Hyperlipidemia Paternal Grandfather   . Arthritis Other   . Alcohol abuse Other   . Breast cancer Paternal Aunt   . Anxiety disorder Sister     Social History Social History  Substance Use Topics  . Smoking status: Former Smoker    Packs/day: 0.33    Years: 15.00    Types: Cigarettes    Quit date: 04/23/2014  . Smokeless tobacco: Never Used  . Alcohol use Yes     Comment: once a month or less     Allergies   Codeine and  Doxycycline   Review of Systems Review of Systems  Constitutional: Negative for chills and fever.  HENT: Negative for congestion and rhinorrhea.   Eyes: Negative for redness and visual disturbance.  Respiratory: Positive for shortness of breath. Negative for wheezing.   Cardiovascular: Positive for chest pain and near-syncope. Negative for palpitations.  Gastrointestinal: Positive for nausea. Negative for vomiting.  Genitourinary: Negative for dysuria and urgency.  Musculoskeletal: Negative for arthralgias and myalgias.  Skin: Negative for pallor and wound.  Neurological: Positive for dizziness. Negative for headaches.     Physical Exam  Updated Vital Signs BP 130/72 (BP Location: Left Arm)   Pulse 72   Temp 98.4 F (36.9 C) (Oral)   Resp 15   LMP 10/19/2015   SpO2 98%   Physical Exam  Constitutional: She is oriented to person, place, and time. She appears well-developed and well-nourished. No distress.  HENT:  Head: Normocephalic and atraumatic.  Eyes: EOM are normal. Pupils are equal, round, and reactive to light.  Neck: Normal range of motion. Neck supple.  Cardiovascular: Normal rate and regular rhythm.  Exam reveals no gallop and no friction rub.   No murmur heard. Pulmonary/Chest: Effort normal. She has no wheezes. She has no rales.  Abdominal: Soft. She exhibits no distension and no mass. There is tenderness (epigastric). There is no guarding.  Musculoskeletal: She exhibits no edema or tenderness.  Neurological: She is alert and oriented to person, place, and time.  Skin: Skin is warm and dry. She is not diaphoretic.  Psychiatric: She has a normal mood and affect. Her behavior is normal.  Nursing note and vitals reviewed.    ED Treatments / Results  Labs (all labs ordered are listed, but only abnormal results are displayed) Labs Reviewed  BASIC METABOLIC PANEL  CBC  D-DIMER, QUANTITATIVE (NOT AT [facility])  Randolm Idol, ED    EKG  EKG  Interpretation  Date/Time:  Wednesday October 19 2015 13:10:40 EDT Ventricular Rate:  69 PR Interval:    QRS Duration: 86 QT Interval:  408 QTC Calculation: 438 R Axis:   -6 Text Interpretation:  Sinus rhythm Low voltage, precordial leads since last tracing no significant change Confirmed by BELFI  MD, MELANIE ([id number]) on 10/19/2015 1:19:23 PM       Radiology Dg Chest 2 View  Result Date: 10/19/2015 CLINICAL DATA:  Chest pain, arm tingling with onset of symptoms and 11:45 a.m. today. Some shortness of breath. EXAM: CHEST  2 VIEW COMPARISON:  PA and lateral chest x-ray of September 12, 2014 FINDINGS: The lungs are adequately inflated. There is no focal infiltrate. There is no pleural effusion. The heart and pulmonary vascularity are normal. The mediastinum is normal in width. The bony thorax is unremarkable. IMPRESSION: There is no active cardiopulmonary disease. Electronically Signed   By: David  Martinique M.D.   On: 10/19/2015 13:41    Procedures Procedures (including critical care time)  Medications Ordered in ED Medications  gi cocktail (Maalox,Lidocaine,Donnatal) (30 mLs Oral Given 10/19/15 1738)     Initial Impression / Assessment and Plan / ED Course  I have reviewed the triage vital signs and the nursing notes.  Pertinent labs & imaging results that were available during my care of the patient were reviewed by me and considered in my medical decision making (see chart for details).  Clinical Course    35 yo F With a chief complaints of chest pain. Completely atypical. EKG and chest x-ray are unremarkable. Feel that ACS unlikely. Patient is on birth control pills I'm unable to use the [facility] criteria, she does have some symptoms that are possibly  reflux disease which I feel is most likely based on her history. I will obtain a d-dimer.  Ddimer negative, d/c home.   1:15 AM:  I have discussed the diagnosis/risks/treatment options with the patient and family and believe the pt  to be eligible for discharge home to follow-up with PCP. We also discussed returning to the ED immediately if new or worsening sx occur. We discussed the sx which are most concerning (e.g., sudden worsening  pain, fever, inability to tolerate by mouth) that necessitate immediate return. Medications administered to the patient during their visit and any new prescriptions provided to the patient are listed below.  Medications given during this visit Medications  gi cocktail (Maalox,Lidocaine,Donnatal) (30 mLs Oral Given 10/19/15 1738)     The patient appears reasonably screen and/or stabilized for discharge and I doubt any other medical condition or other [facility] requiring further screening, evaluation, or treatment in the ED at this time prior to discharge.    Final Clinical Impressions(s) / ED Diagnoses   Final diagnoses:  Atypical chest pain    New Prescriptions Discharge Medication List as of 10/19/2015  6:00 PM       Deno Etienne, DO 10/20/15 0115

## 2015-10-19 NOTE — ED Triage Notes (Signed)
Pt complains of chest pain since 1145AM today. Pt states she then felt numbness and tingling in her left arm and shortness of breath.

## 2015-10-19 NOTE — Discharge Instructions (Signed)
Try zantac 150mg twice a day.  ° ° °

## 2015-10-26 ENCOUNTER — Ambulatory Visit: Payer: Federal, State, Local not specified - PPO | Admitting: Family

## 2015-10-31 ENCOUNTER — Ambulatory Visit (HOSPITAL_COMMUNITY): Payer: Self-pay | Admitting: Psychology

## 2015-11-03 ENCOUNTER — Ambulatory Visit (INDEPENDENT_AMBULATORY_CARE_PROVIDER_SITE_OTHER): Payer: Federal, State, Local not specified - PPO | Admitting: Family

## 2015-11-03 ENCOUNTER — Other Ambulatory Visit (INDEPENDENT_AMBULATORY_CARE_PROVIDER_SITE_OTHER): Payer: Federal, State, Local not specified - PPO

## 2015-11-03 VITALS — BP 120/80 | HR 77 | Temp 99.3°F | Wt 285.0 lb

## 2015-11-03 DIAGNOSIS — R0789 Other chest pain: Secondary | ICD-10-CM | POA: Diagnosis not present

## 2015-11-03 DIAGNOSIS — R400 Somnolence: Secondary | ICD-10-CM

## 2015-11-03 DIAGNOSIS — R5383 Other fatigue: Secondary | ICD-10-CM

## 2015-11-03 DIAGNOSIS — R079 Chest pain, unspecified: Secondary | ICD-10-CM | POA: Insufficient documentation

## 2015-11-03 LAB — CBC WITH DIFFERENTIAL/PLATELET
BASOS ABS: 0 10*3/uL (ref 0.0–0.1)
Basophils Relative: 0.3 % (ref 0.0–3.0)
EOS ABS: 0.1 10*3/uL (ref 0.0–0.7)
Eosinophils Relative: 0.8 % (ref 0.0–5.0)
HEMATOCRIT: 37.2 % (ref 36.0–46.0)
HEMOGLOBIN: 12.8 g/dL (ref 12.0–15.0)
LYMPHS PCT: 25.7 % (ref 12.0–46.0)
Lymphs Abs: 2.1 10*3/uL (ref 0.7–4.0)
MCHC: 34.5 g/dL (ref 30.0–36.0)
MCV: 85 fl (ref 78.0–100.0)
Monocytes Absolute: 0.4 10*3/uL (ref 0.1–1.0)
Monocytes Relative: 4.6 % (ref 3.0–12.0)
Neutro Abs: 5.5 10*3/uL (ref 1.4–7.7)
Neutrophils Relative %: 68.6 % (ref 43.0–77.0)
Platelets: 262 10*3/uL (ref 150.0–400.0)
RBC: 4.38 Mil/uL (ref 3.87–5.11)
RDW: 12.9 % (ref 11.5–15.5)
WBC: 8 10*3/uL (ref 4.0–10.5)

## 2015-11-03 LAB — MONONUCLEOSIS SCREEN: Mono Screen: NEGATIVE

## 2015-11-03 NOTE — Assessment & Plan Note (Signed)
Continues to experience the associated symptoms of atypical chest pain which do not appear to be muscle skeletal in origin and given most recent workup unlikely cardiology. Symptoms generally not consistent with gastroesophageal reflux although cannot be ruled out. Other differentials include possible anxiety and somatic sensation from depression. Recommend trial of over-the-counter proton pump inhibitor and follow-up if symptoms worsen or do not improve.

## 2015-11-03 NOTE — Progress Notes (Signed)
Subjective:    Patient ID: [name], female    DOB: [birth year], 35 y.o.   MRN: [id number]  Chief Complaint  Patient presents with  . Chest Pain    Intermittnent, not related to meals. See 10/19/15 ED encounter  . Dizziness  . Fatigue    Going to bed earlier and still not feeling rested    HPI:  [name] is a 35 y.o. female who  has a past medical history of Anxiety; Cluster headaches; Depression; Eczema; GERD (gastroesophageal reflux disease); Gestational diabetes (2012, 2016); Hyperlipidemia; Hypertension; Sciatica; and Snoring. and presents today For a follow-up office visit.  Recently evaluated in the emergency department with the chief complaint of chest pain that have been waxing and waning prior to presentation to the emergency department. Pain is described as sharp and shooting lasting only for a few minutes at a time and then resolved without treatment. Chest pain was located substernally and radiated a severity of 7/10. She did also expressed dizziness, nausea, near syncope and shortness of breath. Physical exam she was noted to have epigastric tenderness with no other significant findings. EKG showed sinus rhythm. She was diagnosed with gastroesophageal reflux and given a GI cocktail. All ED records and labs were reviewed in detail.   This is a new problem. Since leaving the ED she continues to experience the sharp chest pain that is not related to meals. Also notes she has been experience fatigue that she feels she goes to bed early and not feeling rested in the morning. Gets about 8-10 hours of sleep and continues to feel tired. Continues to experience daytime sleepiness. Was previously tested for sleep apnea and belived to have airway obstruction at times.  There was some improvement with the GI cocktail but did not stop the pain.   Wt Readings from Last 3 Encounters:  11/03/15 285 lb (129.3 kg)  09/13/15 281 lb (127.5 kg)  05/16/15 265 lb (120.2 kg)     Allergies  Allergen Reactions  . Codeine     STOMACH CRAMPING  . Doxycycline Nausea And Vomiting      Outpatient Medications Prior to Visit  Medication Sig Dispense Refill  . cyclobenzaprine (FLEXERIL) 10 MG tablet Take 1 tablet (10 mg total) by mouth 3 (three) times daily as needed for muscle spasms. 30 tablet 0  . diazepam (VALIUM) 5 MG tablet Take 1 tablet (5 mg total) by mouth every 6 (six) hours as needed for anxiety. 10 tablet 0  . Diclofenac Sodium (PENNSAID) 2 % SOLN Place 1 application onto the skin 2 (two) times daily as needed. 16 g 0  . FLUoxetine (PROZAC) 20 MG tablet Take 40 mg by mouth every morning.    Marland Kitchen FLUoxetine 60 MG TABS Take 60 mg by mouth daily. 30 tablet 3  . ibuprofen (ADVIL,MOTRIN) 600 MG tablet Take 600 mg by mouth every 6 (six) hours as needed for headache, mild pain or moderate pain.    Lenda Kelp FE 1/20 1-20 MG-MCG tablet Take 1 tablet by mouth every morning.    . meloxicam (MOBIC) 15 MG tablet Take 1 tablet (15 mg total) by mouth daily. (Patient taking differently: Take 15 mg by mouth daily as needed for pain. ) 30 tablet 2  . traMADol (ULTRAM) 50 MG tablet Take 1 tablet (50 mg total) by mouth every 8 (eight) hours as needed. 30 tablet 0   No facility-administered medications prior to visit.       Past Surgical History:  Procedure Laterality Date  . CESAREAN SECTION  09 & 12   x's 2  . CESAREAN SECTION WITH BILATERAL TUBAL LIGATION Bilateral 12/18/2014   Procedure: REPEAT CESAREAN SECTION WITH BILATERAL TUBAL LIGATION;  Surgeon: Crawford Givens, MD;  Location: [facility];  Service: Obstetrics;  Laterality: Bilateral;  . COLPOSCOPY    . DENTAL SURGERY    . Ganglion removal from (L) wrist  1998  . LAPAROSCOPIC UNILATERAL SALPINGECTOMY Left 01/26/2015   Procedure: LAPAROSCOPIC UNILATERAL SALPINGECTOMY;  Surgeon: Crawford Givens, MD;  Location: [facility];  Service: Gynecology;  Laterality: Left;  . WISDOM TOOTH EXTRACTION        Past Medical History:   Diagnosis Date  . Anxiety   . Cluster headaches   . Depression   . Eczema   . GERD (gastroesophageal reflux disease)   . Gestational diabetes 2012, 2016  . Hyperlipidemia   . Hypertension    no meds currently  . Sciatica   . Snoring    sleep study 02/2012 with min AHI events    Review of Systems  Constitutional: Negative for chills and fever.  Respiratory: Negative for chest tightness and shortness of breath.   Cardiovascular: Positive for chest pain.      Objective:    BP 120/80   Pulse 77   Temp 99.3 F (37.4 C) (Oral)   Wt 285 lb (129.3 kg)   LMP 10/19/2015   SpO2 98%   BMI 52.13 kg/m  Nursing note and vital signs reviewed.  Physical Exam  Constitutional: She is oriented to person, place, and time. She appears well-developed and well-nourished. No distress.  HENT:  Right Ear: Hearing, tympanic membrane, external ear and ear canal normal.  Left Ear: Hearing, tympanic membrane, external ear and ear canal normal.  Nose: Nose normal. No septal deviation.  Mouth/Throat: Uvula is midline, oropharynx is clear and moist and mucous membranes are normal.  Cardiovascular: Normal rate, regular rhythm, normal heart sounds and intact distal pulses.   Pulmonary/Chest: Effort normal and breath sounds normal.  Neurological: She is alert and oriented to person, place, and time.  Skin: Skin is warm and dry.  Psychiatric: She has a normal mood and affect. Her behavior is normal. Judgment and thought content normal.       Assessment & Plan:   Problem List Items Addressed This Visit      Other   Morbid obesity ([place])    BMI of 52.13. Recommend weight loss of 5-10% of current body weight. Recommend increasing physical activity to 30 minutes of moderate level activity daily. Encourage nutritional intake that focuses on nutrient dense foods and is moderate, varied, and balanced and is low in saturated fats and processed/sugary foods. Continue to monitor.       Fatigue - Primary     Fatigue with potential origins of metabolic, sleep apnea, and psychological. Most recent workup in the emergency department with no significant cardiac findings. Obtain CBC with differential, B12/folate, mononucleosis, and TSH. Refer to pulmonology for sleep study as patient was last tested approximately 2 years ago and continues to have issues with fatigue and sleepiness. Does have depression and cannot rule out exacerbation. Recommend aggressive weight loss. Follow up pending further testing.       Relevant Orders   B12 and Folate Panel   TSH   Mononucleosis screen   CBC w/Diff   Atypical chest pain    Continues to experience the associated symptoms of atypical chest pain which do not appear to be muscle  skeletal in origin and given most recent workup unlikely cardiology. Symptoms generally not consistent with gastroesophageal reflux although cannot be ruled out. Other differentials include possible anxiety and somatic sensation from depression. Recommend trial of over-the-counter proton pump inhibitor and follow-up if symptoms worsen or do not improve.       Other Visit Diagnoses    Daytime sleepiness       Relevant Orders   Ambulatory referral to Pulmonology       I am having Ms. Butner maintain her meloxicam, diazepam, cyclobenzaprine, Diclofenac Sodium, FLUoxetine HCl, traMADol, FLUoxetine, JUNEL FE 1/20, and ibuprofen.   Follow-up: Return in about 1 month (around 12/04/2015), or if symptoms worsen or fail to improve.  Mauricio Po, FNP

## 2015-11-03 NOTE — Assessment & Plan Note (Signed)
BMI of 52.13. Recommend weight loss of 5-10% of current body weight. Recommend increasing physical activity to 30 minutes of moderate level activity daily. Encourage nutritional intake that focuses on nutrient dense foods and is moderate, varied, and balanced and is low in saturated fats and processed/sugary foods. Continue to monitor.

## 2015-11-03 NOTE — Assessment & Plan Note (Signed)
Fatigue with potential origins of metabolic, sleep apnea, and psychological. Most recent workup in the emergency department with no significant cardiac findings. Obtain CBC with differential, B12/folate, mononucleosis, and TSH. Refer to pulmonology for sleep study as patient was last tested approximately 2 years ago and continues to have issues with fatigue and sleepiness. Does have depression and cannot rule out exacerbation. Recommend aggressive weight loss. Follow up pending further testing.

## 2015-11-03 NOTE — Patient Instructions (Addendum)
Thank you for choosing [facility].  SUMMARY AND INSTRUCTIONS:   Labs:  Please stop by the lab on the lower level of the building for your blood work. Your results will be released to [facility] (or called to you) after review, usually within 72 hours after test completion. If any changes need to be made, you will be notified at that same time.  1.) The lab is open from 7:30am to 5:30 pm Monday-Friday 2.) No appointment is necessary 3.) Fasting (if needed) is 6-8 hours after food and drink; black coffee and water are okay   Imaging / Radiology:  Please stop by radiology on the basement level of the building for your x-rays. Your results will be released to [facility] (or called to you) after review, usually within 72 hours after test completion. If any treatments or changes are necessary, you will be notified at that same time.  Referrals:  They will call to schedule your sleep study.  Referrals have been made during this visit. You should expect to hear back from our schedulers in about 7-10 days in regards to establishing an appointment with the specialists we discussed.   Follow up:  If your symptoms worsen or fail to improve, please contact our office for further instruction, or in case of emergency go directly to the emergency room at the closest medical facility.    Fatigue Fatigue is feeling tired all of the time, a lack of energy, or a lack of motivation. Occasional or mild fatigue is often a normal response to activity or life in general. However, long-lasting (chronic) or extreme fatigue may indicate an underlying medical condition. HOME CARE INSTRUCTIONS  Watch your fatigue for any changes. The following actions may help to lessen any discomfort you are feeling:  Talk to your health care provider about how much sleep you need each night. Try to get the required amount every night.  Take medicines only as directed by your health care provider.  Eat a healthy and  nutritious diet. Ask your health care provider if you need help changing your diet.  Drink enough fluid to keep your urine clear or pale yellow.  Practice ways of relaxing, such as yoga, meditation, massage therapy, or acupuncture.  Exercise regularly.   Change situations that cause you stress. Try to keep your work and personal routine reasonable.  Do not abuse illegal drugs.  Limit alcohol intake to no more than 1 drink per day for nonpregnant women and 2 drinks per day for men. One drink equals 12 ounces of beer, 5 ounces of wine, or 1 ounces of hard liquor.  Take a multivitamin, if directed by your health care provider. SEEK MEDICAL CARE IF:   Your fatigue does not get better.  You have a fever.   You have unintentional weight loss or gain.  You have headaches.   You have difficulty:   Falling asleep.  Sleeping throughout the night.  You feel angry, guilty, anxious, or sad.   You are unable to have a bowel movement (constipation).   You skin is dry.   Your legs or another part of your body is swollen.  SEEK IMMEDIATE MEDICAL CARE IF:   You feel confused.   Your vision is blurry.  You feel faint or pass out.   You have a severe headache.   You have severe abdominal, pelvic, or back pain.   You have chest pain, shortness of breath, or an irregular or fast heartbeat.   You are unable  to urinate or you urinate less than normal.   You develop abnormal bleeding, such as bleeding from the rectum, vagina, nose, lungs, or nipples.  You vomit blood.   You have thoughts about harming yourself or committing suicide.   You are worried that you might harm someone else.    This information is not intended to replace advice given to you by your health care provider. Make sure you discuss any questions you have with your health care provider.   Document Released: 10/22/2006 Document Revised: 01/15/2014 Document Reviewed: 04/28/2013 Elsevier  Interactive Patient Education [employer].

## 2015-11-03 NOTE — Progress Notes (Signed)
Pre visit review using our clinic review tool, if applicable. No additional management support is needed unless otherwise documented below in the visit note. 

## 2015-11-07 ENCOUNTER — Encounter: Payer: Self-pay | Admitting: Family

## 2015-11-07 LAB — B12 AND FOLATE PANEL
FOLATE: 15 ng/mL (ref >5.9)
Vitamin B-12: 307 pg/mL (ref 211–911)

## 2015-11-07 LAB — TSH: TSH: 1.34 u[IU]/mL (ref 0.35–4.50)

## 2015-11-08 ENCOUNTER — Other Ambulatory Visit (HOSPITAL_COMMUNITY): Payer: Self-pay

## 2015-11-08 MED ORDER — FLUOXETINE HCL 20 MG PO TABS: 60.0000 mg | ORAL_TABLET | ORAL | 0 refills | Status: DC

## 2015-11-14 ENCOUNTER — Ambulatory Visit (INDEPENDENT_AMBULATORY_CARE_PROVIDER_SITE_OTHER): Payer: Federal, State, Local not specified - PPO | Admitting: Psychology

## 2015-11-14 DIAGNOSIS — F330 Major depressive disorder, recurrent, mild: Secondary | ICD-10-CM | POA: Diagnosis not present

## 2015-11-14 NOTE — Progress Notes (Signed)
   THERAPIST PROGRESS NOTE  Session Time: 12.30pm-1.25pm  Participation Level: Active  Behavioral Response: Well GroomedAlertaffect wnL  Type of Therapy: Individual Therapy  Treatment Goals addressed: Diagnosis: MDd and goal 1.  Interventions: CBT and Supportive  Summary: [name] is a 35 y.o. female who presents with report of stressors.  Pt reported that last week stress of interactions w/ her brother-in-law- attempting to resolve broken down truck housed at his property and barriers placed.  Pt reported that she and sister just started talking again and then this conflict leads to strain again.  Pt doesn't feel there is a way of maintaining positive interactions w/ her while brother in law places barriers.  Pt discussed that financially still struggling and this is major stressor- pt has been having chest pain and low grade fevers that her PCP feels is stress related.  Pt reports still feels fatigued and want to sleep a lot- pt has been referred to f/u w/ sleep specialist again.  Pt has been dx in past w/ a obstructive airway issue that keeps her from completing full sleep cycle.  Pt discussed positives w/ interactions of her immediate family and keep her focus on care for them and self care.   Suicidal/Homicidal: Nowithout intent/plan  Therapist Response: Assessed pt current functioning per pt report.  Processed w/pt her stressors and impact having.  Explored conflict w/siblings and how to attempt to communicate assertively and how to seek resolution for self.    Plan: Return again in 2 weeks.  Diagnosis: MDD    [name], [facility] 11/14/2015

## 2015-11-28 ENCOUNTER — Ambulatory Visit (INDEPENDENT_AMBULATORY_CARE_PROVIDER_SITE_OTHER): Payer: Federal, State, Local not specified - PPO | Admitting: Psychology

## 2015-11-28 DIAGNOSIS — F330 Major depressive disorder, recurrent, mild: Secondary | ICD-10-CM | POA: Diagnosis not present

## 2015-11-28 NOTE — Progress Notes (Signed)
   THERAPIST PROGRESS NOTE  Session Time: 12.30pm-1.19pm  Participation Level: Active  Behavioral Response: Well GroomedAlertaffect wnl  Type of Therapy: Individual Therapy  Treatment Goals addressed: Diagnosis: MDD and goal 1.  Interventions: CBT and Supportive  Summary: KARI AQUILA is a 35 y.o. female who presents with affect WNL.  Pt reports tired didn't sleep well last night w/ baby's fussiness. Pt reported that some stressors over past couple of weeks.  Pt discussed worry about children relationships straining as older as she considers her relationships w/ siblings. Pt is able to acknowledge that circumstances she grew up w/ is different than her children's.  Pt discussed stressor at work last week w/ major mistake made and how impacting her.  Pt reported that focused on gratitude and not taking things for granted.  Pt discussed enjoying time w/ son over weekend w/ family gone and not feeling guilty that didn't get all chores done.     Suicidal/Homicidal: Nowithout intent/plan  Therapist Response: Assessed pt current functioning per pt report.  Processed w/pt interactions w/ family.  Explored w/pt concerns about sibling relationships and impact upbringing had on them.  "Dicussed self compassion and importance of self care.   Plan: Return again in 2 weeks.  Diagnosis: MDD    [name], [facility] 11/28/2015

## 2015-12-12 ENCOUNTER — Ambulatory Visit (HOSPITAL_COMMUNITY): Payer: Self-pay | Admitting: Psychology

## 2015-12-12 ENCOUNTER — Encounter (HOSPITAL_COMMUNITY): Payer: Self-pay | Admitting: Psychology

## 2015-12-12 NOTE — Progress Notes (Signed)
[name] is a 35 y.o. female patient who called and informed she was unable to keep her appointment today.        [name], LPC

## 2015-12-26 ENCOUNTER — Ambulatory Visit (INDEPENDENT_AMBULATORY_CARE_PROVIDER_SITE_OTHER): Payer: Federal, State, Local not specified - PPO | Admitting: Psychology

## 2015-12-26 DIAGNOSIS — F330 Major depressive disorder, recurrent, mild: Secondary | ICD-10-CM

## 2015-12-26 DIAGNOSIS — F411 Generalized anxiety disorder: Secondary | ICD-10-CM

## 2015-12-26 NOTE — Progress Notes (Signed)
   THERAPIST PROGRESS NOTE  Session Time: 12.45pm-1.32pm  Participation Level: Active  Behavioral Response: Well GroomedAlertworry  Type of Therapy: Individual Therapy  Treatment Goals addressed: Diagnosis: MDD, GAD and goal 1.  Interventions: CBT  Summary: [name] is a 35 y.o. female who presents with affect WNL.  Pt reported that she has been doing ok.  Pt discussed things that irritated about and some stressors.  Pt reported worry about her daughter and recognized that she was avoiding school and discovered she was being bullied.  Pt how supporting her.  Pt reported on resolved conflict w/brother and that able to express points of view and apologize to each other.  Pt discussed anniversary of her mother's death approaching and how things still unresolved- feels guilt.  Pt was able to identify that evaluating things based on hindsight bias and that decisions made at time based on needs/directions pulled in at the time. Pt is excited for upcoming holidays and time w/ kids.     Suicidal/Homicidal: Nowithout intent/plan  Therapist Response: Assessed pt current functioning per pt report. Processed w/pt interactions w/ family.  Explored w/pt how supporting daughter and valid worry and support giving.  Assisted pt in awareness of hindsight basis impacting how viewing decisions she made re: her interactions w/ her mother prior to her death.  Encouraged acceptance towards resolution.   Plan: Return again in 2 weeks.  Diagnosis: MDD, GAD    [name], LPC 12/26/2015

## 2016-01-11 ENCOUNTER — Ambulatory Visit (HOSPITAL_COMMUNITY): Payer: Self-pay | Admitting: Psychology

## 2016-01-11 ENCOUNTER — Encounter (HOSPITAL_COMMUNITY): Payer: Self-pay | Admitting: Psychology

## 2016-01-11 NOTE — Progress Notes (Signed)
[name] is a 36 y.o. female patient who called and cancelled appointment for today.        [name], LPC

## 2016-01-12 ENCOUNTER — Encounter (HOSPITAL_COMMUNITY): Payer: Self-pay | Admitting: Psychiatry

## 2016-01-12 ENCOUNTER — Ambulatory Visit (INDEPENDENT_AMBULATORY_CARE_PROVIDER_SITE_OTHER): Payer: Federal, State, Local not specified - PPO | Admitting: Psychiatry

## 2016-01-12 ENCOUNTER — Ambulatory Visit (HOSPITAL_COMMUNITY): Payer: Self-pay | Admitting: Psychiatry

## 2016-01-12 VITALS — BP 128/72 | HR 68 | Ht 62.5 in | Wt 285.2 lb

## 2016-01-12 DIAGNOSIS — Z8489 Family history of other specified conditions: Secondary | ICD-10-CM

## 2016-01-12 DIAGNOSIS — F17210 Nicotine dependence, cigarettes, uncomplicated: Secondary | ICD-10-CM

## 2016-01-12 DIAGNOSIS — Z811 Family history of alcohol abuse and dependence: Secondary | ICD-10-CM

## 2016-01-12 DIAGNOSIS — F4010 Social phobia, unspecified: Secondary | ICD-10-CM | POA: Diagnosis not present

## 2016-01-12 DIAGNOSIS — Z8261 Family history of arthritis: Secondary | ICD-10-CM

## 2016-01-12 DIAGNOSIS — Z818 Family history of other mental and behavioral disorders: Secondary | ICD-10-CM

## 2016-01-12 DIAGNOSIS — Z813 Family history of other psychoactive substance abuse and dependence: Secondary | ICD-10-CM

## 2016-01-12 DIAGNOSIS — F411 Generalized anxiety disorder: Secondary | ICD-10-CM | POA: Diagnosis not present

## 2016-01-12 DIAGNOSIS — F330 Major depressive disorder, recurrent, mild: Secondary | ICD-10-CM

## 2016-01-12 DIAGNOSIS — Z823 Family history of stroke: Secondary | ICD-10-CM

## 2016-01-12 MED ORDER — FLUOXETINE HCL 20 MG PO TABS: 60.0000 mg | ORAL_TABLET | ORAL | 0 refills | Status: DC
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
## 2016-01-17 ENCOUNTER — Encounter: Payer: Self-pay | Admitting: Pulmonary Disease

## 2016-01-17 ENCOUNTER — Ambulatory Visit (INDEPENDENT_AMBULATORY_CARE_PROVIDER_SITE_OTHER): Payer: Federal, State, Local not specified - PPO | Admitting: Pulmonary Disease

## 2016-01-17 VITALS — BP 118/74 | HR 76 | Ht 62.5 in | Wt 283.4 lb

## 2016-01-17 DIAGNOSIS — G4710 Hypersomnia, unspecified: Secondary | ICD-10-CM | POA: Diagnosis not present

## 2016-01-17 NOTE — Patient Instructions (Signed)
It is possible that obstructive sleep apnea has worsened due to your weight gain  Scheduled split night study followed by [facility]  Keep a sleep diary for 2 weeks prior to study

## 2016-01-17 NOTE — Assessment & Plan Note (Signed)
It is possible that obstructive sleep apnea has worsened due to your weight gain  Differential diagnosis includes narcolepsy and idiopathic hypersomnolence-on her previous sleep study she had several RERAs suggestive upper airway resistance syndrome-hence I am hopeful that if this is the cause then we should be able to help her with CPAP therapy  Scheduled split night study followed by [facility] T  Keep a sleep diary for 2 weeks prior to study

## 2016-01-17 NOTE — Progress Notes (Signed)
Subjective:    Patient ID: [name], female    DOB: [birth year], 36 y.o.   MRN: [id number]  HPI  Chief Complaint  Patient presents with  . Sleep Consult    Prev pt of Dr. Gwenette Greet. Diagnosed with "pre apnea". Pt reports not sleeping well at night. Has episodes of falling asleep at night and waking right back up. Denies SOB while sleeping. Daytime sleepiness.     36 year old obese on presents for evaluation of excessive daytime somnolence. She underwent overnight polysomnogram in 02/2012 when she weighed 263 pounds. Total sleep time was 350 minutes, AHI was 1.2/hour with few RERAs. 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 Due to persistent symptoms she was referred to a dentist and had a dental appliance made which she has been using for the past 3 years. She however reports increase in her daytime somnolence and loud snoring that has been noted by her husband to the point where she is finding it difficult to function. 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 She also has depression and is maintained on fluoxetine  Epworth sleepiness score is 12 Bedtime is between 9 and 10 PM, sleep latency is minimal, she sleeps on his right side or on her back, reports 5-6 nocturnal awakenings and is out of bed on weekdays by 6 AM. On weekends she stays in bed until 9 AM, but wakes up feeling tired with occasional dryness of mouth and denies headaches. She drinks 2 cups of coffee every morning  On weekends she will frequently nap There is no history suggestive of cataplexy, sleep paralysis or parasomnias She smokes about a pack every 4 days and is gained about 20 pounds in the last 2 years   Past Medical History:  Diagnosis Date  . Anxiety   . Cluster headaches   . Depression   . Eczema   . GERD (gastroesophageal reflux disease)   . Gestational diabetes 2012, 2016  . Hyperlipidemia   . Hypertension    no meds currently  . Sciatica   . Snoring    sleep study 02/2012 with min  AHI events     Past Surgical History:  Procedure Laterality Date  . CESAREAN SECTION  09 & 12   x's 2  . CESAREAN SECTION WITH BILATERAL TUBAL LIGATION Bilateral 12/18/2014   Procedure: REPEAT CESAREAN SECTION WITH BILATERAL TUBAL LIGATION;  Surgeon: Crawford Givens, MD;  Location: [facility];  Service: Obstetrics;  Laterality: Bilateral;  . COLPOSCOPY    . DENTAL SURGERY    . Ganglion removal from (L) wrist  1998  . LAPAROSCOPIC UNILATERAL SALPINGECTOMY Left 01/26/2015   Procedure: LAPAROSCOPIC UNILATERAL SALPINGECTOMY;  Surgeon: Crawford Givens, MD;  Location: [facility];  Service: Gynecology;  Laterality: Left;  . WISDOM TOOTH EXTRACTION      Allergies  Allergen Reactions  . Codeine     STOMACH CRAMPING  . Doxycycline Nausea And Vomiting     Social History   Social History  . Marital status: Married    Spouse name: N/A  . Number of children: 3  . Years of education: 80   Occupational History  . Not on file.   Social History Main Topics  . Smoking status: Light Tobacco Smoker    Packs/day: 0.33    Years: 15.00    Types: Cigarettes    Last attempt to quit: 04/23/2014  . Smokeless tobacco: Never Used  . Alcohol use Yes     Comment: once a month  or less  . Drug use: No  . Sexual activity: Yes    Birth control/ protection: None   Other Topics Concern  . Not on file   Social History Narrative   Secretary   Married, lives with spouse and 2 kids   Moved to [facility] summer 2011 to be near mom - originally from [place]   Denies abuse and feels safe at home.     Family History  Problem Relation Age of Onset  . Arthritis Mother   . Alcohol abuse Mother   . Mental illness Mother   . Cancer Mother 69    squamous - unknown primary  . Coronary artery disease Mother 61    MI with stent  . Anxiety disorder Mother   . Depression Mother   . Arthritis Father   . Alcohol abuse Father   . Hyperlipidemia Father   . Heart disease Father   . Kidney disease Father   . Stroke Father   .  Mental illness Father   . Drug abuse Father   . Diabetes Maternal Grandmother   . Heart disease Maternal Grandmother   . Stroke Maternal Grandfather   . Hypertension Paternal Grandfather   . Hyperlipidemia Paternal Grandfather   . Arthritis Other   . Alcohol abuse Other   . Breast cancer Paternal Aunt   . Anxiety disorder Sister       Review of Systems  Constitutional: Negative for fever and unexpected weight change.  HENT: Positive for congestion. Negative for dental problem, ear pain, nosebleeds, postnasal drip, rhinorrhea, sinus pressure, sneezing, sore throat and trouble swallowing.   Eyes: Negative for redness and itching.  Respiratory: Positive for chest tightness and shortness of breath. Negative for cough and wheezing.   Cardiovascular: Negative for palpitations and leg swelling.  Gastrointestinal: Negative for nausea and vomiting.  Genitourinary: Negative for dysuria.  Musculoskeletal: Positive for joint swelling.  Skin: Positive for rash.  Neurological: Positive for headaches.  Hematological: Does not bruise/bleed easily.  Psychiatric/Behavioral: Negative for dysphoric mood. The patient is nervous/anxious.        Objective:   Physical Exam  Gen. Pleasant, obese, in no distress, normal affect ENT - no lesions, no post nasal drip, class 2-3 airway Neck: No JVD, no thyromegaly, no carotid bruits Lungs: no use of accessory muscles, no dullness to percussion, decreased without rales or rhonchi  Cardiovascular: Rhythm regular, heart sounds  normal, no murmurs or gallops, no peripheral edema Abdomen: soft and non-tender, no hepatosplenomegaly, BS normal. Musculoskeletal: No deformities, no cyanosis or clubbing Neuro:  alert, non focal, no tremors       Assessment & Plan:

## 2016-01-23 ENCOUNTER — Ambulatory Visit (HOSPITAL_COMMUNITY): Payer: Self-pay | Admitting: Psychology

## 2016-01-26 ENCOUNTER — Ambulatory Visit (HOSPITAL_COMMUNITY): Payer: Self-pay | Admitting: Psychology

## 2016-02-06 ENCOUNTER — Ambulatory Visit (INDEPENDENT_AMBULATORY_CARE_PROVIDER_SITE_OTHER): Payer: Federal, State, Local not specified - PPO | Admitting: Psychology

## 2016-02-06 DIAGNOSIS — F411 Generalized anxiety disorder: Secondary | ICD-10-CM

## 2016-02-06 DIAGNOSIS — F330 Major depressive disorder, recurrent, mild: Secondary | ICD-10-CM

## 2016-02-06 NOTE — Progress Notes (Signed)
   THERAPIST PROGRESS NOTE  Session Time: 12.50pm-1.35pm  Participation Level: Active  Behavioral Response: Well GroomedAlertaffect wnl  Type of Therapy: Individual Therapy  Treatment Goals addressed: Diagnosis: GAd, MDD and goal 1.  Interventions: CBT and Other: tx planning  Summary: [name] is a 36 y.o. female who presents with affect wnl.  Pt arrived late for appointment. Pt reported that overall doing well w/ mood. Pt reported that she has sleep study upcoming. Pt reports that her mom has been on her mind and feeling guilty about not doing more prior to her death. Pt reported she has been talking about this and did express to her husband.  Pt increased awareness of making mistakes and acceptance of this - not beating self up about.  Pt reports that he dad has been increasing contact which has been some stressing.  Pt reported she needs to be ok w/ saying no- anticipating that will ask for favor upcoming.   Pt discussed progress that she has made w/ depression.  Pt reports also progress and increasing awareness of need for self compassion.  Pt identified goals for revise of tx plan.  Pt discussed plan for journaling to assist w/ coping and being more intentional in sessions and out.   Suicidal/Homicidal: Nowithout intent/plan  Therapist Response: Assessed pt current functioning per pt report.  Processed w/ pt guilt and acceptance.  Explored w/pt ideas of self compassion.  Discussed assertive responses.  Explored w/pt progress w/ tx and identified new goals for tx.    Plan: Return again in 2 weeks.  Diagnosis: GAD, MDD    Pranay Hilbun, [facility] 02/06/2016

## 2016-02-10 DIAGNOSIS — K080 Exfoliation of teeth due to systemic causes: Secondary | ICD-10-CM | POA: Diagnosis not present

## 2016-02-13 ENCOUNTER — Encounter: Payer: Self-pay | Admitting: Family

## 2016-02-20 ENCOUNTER — Ambulatory Visit (INDEPENDENT_AMBULATORY_CARE_PROVIDER_SITE_OTHER): Payer: Federal, State, Local not specified - PPO | Admitting: Psychology

## 2016-02-20 DIAGNOSIS — F411 Generalized anxiety disorder: Secondary | ICD-10-CM

## 2016-02-20 DIAGNOSIS — F330 Major depressive disorder, recurrent, mild: Secondary | ICD-10-CM

## 2016-02-20 NOTE — Progress Notes (Signed)
   THERAPIST PROGRESS NOTE  Session Time: 12.35pm-1.25pm  Participation Level: Active  Behavioral Response: Well GroomedAlert, tired  Type of Therapy: Individual Therapy  Treatment Goals addressed: Diagnosis: MDD, GAD and goal 1.  Interventions: CBT and Supportive  Summary: [name] is a 36 y.o. female who presents with report of being tired.  Pt reports she does feel that sleep has been a little better since wearing her mouth piece- but side effect of pain in jaw from wearing. Pt reports feeling overwhelmed w/ work, taking care of kids and no time for self.  Pt reports that also husband has been grumpy lately and she is aware she is taking personally.  Pt was able to identify how to reframe.  Pt reports that relationship w/ sister, brother still bothering- feels betrayed and that sister just wants to act like nothing wrong- when even brother in law made negative  comments to her dad recently.  Pt also discussed how had always felt so close to brother and hurts that no longer reaches out to her.  Pt identified whether positive for self to continue building these relationships or not.    Suicidal/Homicidal: Nowithout intent/plan  Therapist Response: Assessed pt current functioning per pt report.  Processed w/pt interactions w/ family and friends.  Explored w/pt her assumptions and how to reframe and challenge distortions.  Discussed changes in relationships w/ siblings over the years and wants as she moves forwards.   Plan: Return again in 2 weeks.  Diagnosis: MDD, GAD    [name], LPC 02/20/2016

## 2016-03-05 ENCOUNTER — Ambulatory Visit (INDEPENDENT_AMBULATORY_CARE_PROVIDER_SITE_OTHER): Payer: Federal, State, Local not specified - PPO | Admitting: Psychology

## 2016-03-05 ENCOUNTER — Ambulatory Visit (HOSPITAL_BASED_OUTPATIENT_CLINIC_OR_DEPARTMENT_OTHER): Payer: Federal, State, Local not specified - PPO | Attending: Pulmonary Disease | Admitting: Pulmonary Disease

## 2016-03-05 DIAGNOSIS — F411 Generalized anxiety disorder: Secondary | ICD-10-CM | POA: Diagnosis not present

## 2016-03-05 DIAGNOSIS — G4710 Hypersomnia, unspecified: Secondary | ICD-10-CM | POA: Diagnosis not present

## 2016-03-05 DIAGNOSIS — G4733 Obstructive sleep apnea (adult) (pediatric): Secondary | ICD-10-CM | POA: Diagnosis not present

## 2016-03-05 DIAGNOSIS — G4763 Sleep related bruxism: Secondary | ICD-10-CM | POA: Insufficient documentation

## 2016-03-05 DIAGNOSIS — F330 Major depressive disorder, recurrent, mild: Secondary | ICD-10-CM | POA: Diagnosis not present

## 2016-03-05 NOTE — Progress Notes (Signed)
   THERAPIST PROGRESS NOTE  Session Time: 12.40pm-1.25pm  Participation Level: Active  Behavioral Response: Well GroomedAlert, tired- affect wnl  Type of Therapy: Individual Therapy  Treatment Goals addressed: Diagnosis: MDD, GAD and goal 1.  Interventions: CBT and Supportive  Summary: [name] is a 36 y.o. female who presents with report of tired.  Pt reports that she goes in for her sleep study tonight and hoping answers that can be tx. Pt reported that she and husband have had a busy day.  Pt reported that she and father continue talking and he is doing some work for them around the house.  Pt reported that they got a new cat and posted about on facebook.  Pt reported she is worried now that will give family something to "talk about".  Pt was able to identify good for her to share about her life decisions and not having to please others.  Pt reports that she did avoid making negative comments about brother and sister to sister in law recent and feels good that she is working to resolve conflict in this way.  Suicidal/Homicidal: Nowithout intent/plan  Therapist Response: Assessed pt current functioning per pt report. Processed w/pt family interactions and communication.  Discussed pt sharing of information and not over censoring because what other may judge.  Explored w/pt ways she is resolving conflict for self.  Plan: Return again in 2 weeks.  Diagnosis: MDD, GAD    [name], LPC 03/05/2016

## 2016-03-06 ENCOUNTER — Encounter (HOSPITAL_BASED_OUTPATIENT_CLINIC_OR_DEPARTMENT_OTHER): Payer: Self-pay

## 2016-03-13 ENCOUNTER — Encounter: Payer: Self-pay | Admitting: Family

## 2016-03-13 ENCOUNTER — Encounter (INDEPENDENT_AMBULATORY_CARE_PROVIDER_SITE_OTHER): Payer: Federal, State, Local not specified - PPO

## 2016-03-13 ENCOUNTER — Ambulatory Visit (INDEPENDENT_AMBULATORY_CARE_PROVIDER_SITE_OTHER): Payer: Federal, State, Local not specified - PPO | Admitting: Family

## 2016-03-13 VITALS — BP 128/80 | HR 84 | Temp 98.6°F | Resp 16 | Ht 62.0 in | Wt 281.0 lb

## 2016-03-13 DIAGNOSIS — E782 Mixed hyperlipidemia: Secondary | ICD-10-CM

## 2016-03-13 DIAGNOSIS — Z0000 Encounter for general adult medical examination without abnormal findings: Secondary | ICD-10-CM

## 2016-03-13 DIAGNOSIS — F17210 Nicotine dependence, cigarettes, uncomplicated: Secondary | ICD-10-CM | POA: Insufficient documentation

## 2016-03-13 DIAGNOSIS — M25561 Pain in right knee: Secondary | ICD-10-CM

## 2016-03-13 DIAGNOSIS — F411 Generalized anxiety disorder: Secondary | ICD-10-CM

## 2016-03-13 DIAGNOSIS — G8929 Other chronic pain: Secondary | ICD-10-CM

## 2016-03-13 LAB — COMPREHENSIVE METABOLIC PANEL
ALBUMIN: 3.9 g/dL (ref 3.5–5.2)
ALK PHOS: 87 U/L (ref 39–117)
ALT: 10 U/L (ref 0–35)
AST: 9 U/L (ref 0–37)
BILIRUBIN TOTAL: 0.4 mg/dL (ref 0.2–1.2)
BUN: 10 mg/dL (ref 6–23)
CALCIUM: 9 mg/dL (ref 8.4–10.5)
CHLORIDE: 103 meq/L (ref 96–112)
CO2: 29 mEq/L (ref 19–32)
CREATININE: 0.9 mg/dL (ref 0.40–1.20)
GFR: 75.09 mL/min (ref >60.00)
Glucose, Bld: 100 mg/dL — ABNORMAL HIGH (ref 70–99)
Potassium: 4.3 mEq/L (ref 3.5–5.1)
SODIUM: 139 meq/L (ref 135–145)
TOTAL PROTEIN: 7 g/dL (ref 6.0–8.3)

## 2016-03-13 LAB — CBC
HCT: 39.8 % (ref 36.0–46.0)
Hemoglobin: 13.5 g/dL (ref 12.0–15.0)
MCHC: 33.9 g/dL (ref 30.0–36.0)
MCV: 85.2 fl (ref 78.0–100.0)
PLATELETS: 297 10*3/uL (ref 150.0–400.0)
RBC: 4.68 Mil/uL (ref 3.87–5.11)
RDW: 13.4 % (ref 11.5–15.5)
WBC: 7.4 10*3/uL (ref 4.0–10.5)

## 2016-03-13 LAB — HEMOGLOBIN A1C: HEMOGLOBIN A1C: 5.8 % (ref 4.6–6.5)

## 2016-03-13 LAB — VITAMIN D 25 HYDROXY (VIT D DEFICIENCY, FRACTURES): VITD: 22.84 ng/mL — AB (ref 30.00–100.00)

## 2016-03-13 MED ORDER — CYCLOBENZAPRINE HCL 10 MG PO TABS
10.0000 mg | ORAL_TABLET | Freq: Three times a day (TID) | ORAL | 0 refills | Status: DC | PRN
Start: 2016-03-13 — End: 2017-08-14

## 2016-03-13 MED ORDER — DIAZEPAM 5 MG PO TABS: 5.0000 mg | ORAL_TABLET | Freq: Four times a day (QID) | ORAL | 0 refills | Status: DC | PRN

## 2016-03-13 MED ORDER — IBUPROFEN 600 MG PO TABS: 600.0000 mg | ORAL_TABLET | Freq: Four times a day (QID) | ORAL | 0 refills | Status: DC | PRN

## 2016-03-13 NOTE — Assessment & Plan Note (Signed)
Not currently maintained on medication. Obtain lipid profile through [facility]. Continue with lifestyle management pending new blood work results.

## 2016-03-13 NOTE — Patient Instructions (Addendum)
Thank you for choosing [facility].  SUMMARY AND INSTRUCTIONS:  Please start using MyFitnessPal for calorie tracking. Goal will generally be 1200-1500 daily.   Work on increasing physical activity to goal of 30 minutes of moderate level activity daily.  Medication:  Your prescription(s) have been submitted to your pharmacy or been printed and provided for you. Please take as directed and contact our office if you believe you are having problem(s) with the medication(s) or have any questions.  Labs:  Please stop by the lab on the lower level of the building for your blood work. Your results will be released to [place] (or called to you) after review, usually within 72 hours after test completion. If any changes need to be made, you will be notified at that same time.  1.) The lab is open from 7:30am to 5:30 pm Monday-Friday 2.) No appointment is necessary 3.) Fasting (if needed) is 6-8 hours after food and drink; black coffee and water are okay   Follow up:  If your symptoms worsen or fail to improve, please contact our office for further instruction, or in case of emergency go directly to the emergency room at the closest medical facility.    Health Maintenance, Female Adopting a healthy lifestyle and getting preventive care can go a long way to promote health and wellness. Talk with your health care provider about what schedule of regular examinations is right for you. This is a good chance for you to check in with your provider about disease prevention and staying healthy. In between checkups, there are plenty of things you can do on your own. Experts have done a lot of research about which lifestyle changes and preventive measures are most likely to keep you healthy. Ask your health care provider for more information. Weight and diet Eat a healthy diet  Be sure to include plenty of vegetables, fruits, low-fat dairy products, and lean protein.  Do not eat a lot of foods high  in solid fats, added sugars, or salt.  Get regular exercise. This is one of the most important things you can do for your health.  Most adults should exercise for at least 150 minutes each week. The exercise should increase your heart rate and make you sweat (moderate-intensity exercise).  Most adults should also do strengthening exercises at least twice a week. This is in addition to the moderate-intensity exercise. Maintain a healthy weight  Body mass index (BMI) is a measurement that can be used to identify possible weight problems. It estimates body fat based on height and weight. Your health care provider can help determine your BMI and help you achieve or maintain a healthy weight.  For females 105 years of age and older:  A BMI below 18.5 is considered underweight.  A BMI of 18.5 to 24.9 is normal.  A BMI of 25 to 29.9 is considered overweight.  A BMI of 30 and above is considered obese. Watch levels of cholesterol and blood lipids  You should start having your blood tested for lipids and cholesterol at 38 years of age, then have this test every 5 years.  You may need to have your cholesterol levels checked more often if:  Your lipid or cholesterol levels are high.  You are older than 37 years of age.  You are at high risk for heart disease. Cancer screening Lung Cancer  Lung cancer screening is recommended for adults 28-42 years old who are at high risk for lung cancer because of a  history of smoking.  A yearly low-dose CT scan of the lungs is recommended for people who:  Currently smoke.  Have quit within the past 15 years.  Have at least a 30-pack-year history of smoking. A pack year is smoking an average of one pack of cigarettes a day for 1 year.  Yearly screening should continue until it has been 15 years since you quit.  Yearly screening should stop if you develop a health problem that would prevent you from having lung cancer treatment. Breast  Cancer  Practice breast self-awareness. This means understanding how your breasts normally appear and feel.  It also means doing regular breast self-exams. Let your health care provider know about any changes, no matter how small.  If you are in your 20s or 30s, you should have a clinical breast exam (CBE) by a health care provider every 1-3 years as part of a regular health exam.  If you are 33 or older, have a CBE every year. Also consider having a breast X-ray (mammogram) every year.  If you have a family history of breast cancer, talk to your health care provider about genetic screening.  If you are at high risk for breast cancer, talk to your health care provider about having an MRI and a mammogram every year.  Breast cancer gene (BRCA) assessment is recommended for women who have family members with BRCA-related cancers. BRCA-related cancers include:  Breast.  Ovarian.  Tubal.  Peritoneal cancers.  Results of the assessment will determine the need for genetic counseling and BRCA1 and BRCA2 testing. Cervical Cancer  Your health care provider may recommend that you be screened regularly for cancer of the pelvic organs (ovaries, uterus, and vagina). This screening involves a pelvic examination, including checking for microscopic changes to the surface of your cervix (Pap test). You may be encouraged to have this screening done every 3 years, beginning at age 42.  For women ages 38-65, health care providers may recommend pelvic exams and Pap testing every 3 years, or they may recommend the Pap and pelvic exam, combined with testing for human papilloma virus (HPV), every 5 years. Some types of HPV increase your risk of cervical cancer. Testing for HPV may also be done on women of any age with unclear Pap test results.  Other health care providers may not recommend any screening for nonpregnant women who are considered low risk for pelvic cancer and who do not have symptoms. Ask your  health care provider if a screening pelvic exam is right for you.  If you have had past treatment for cervical cancer or a condition that could lead to cancer, you need Pap tests and screening for cancer for at least 20 years after your treatment. If Pap tests have been discontinued, your risk factors (such as having a new sexual partner) need to be reassessed to determine if screening should resume. Some women have medical problems that increase the chance of getting cervical cancer. In these cases, your health care provider may recommend more frequent screening and Pap tests. Colorectal Cancer  This type of cancer can be detected and often prevented.  Routine colorectal cancer screening usually begins at 36 years of age and continues through 37 years of age.  Your health care provider may recommend screening at an earlier age if you have risk factors for colon cancer.  Your health care provider may also recommend using home test kits to check for hidden blood in the stool.  A small camera at  the end of a tube can be used to examine your colon directly (sigmoidoscopy or colonoscopy). This is done to check for the earliest forms of colorectal cancer.  Routine screening usually begins at age 35.  Direct examination of the colon should be repeated every 5-10 years through 37 years of age. However, you may need to be screened more often if early forms of precancerous polyps or small growths are found. Skin Cancer  Check your skin from head to toe regularly.  Tell your health care provider about any new moles or changes in moles, especially if there is a change in a mole's shape or color.  Also tell your health care provider if you have a mole that is larger than the size of a pencil eraser.  Always use sunscreen. Apply sunscreen liberally and repeatedly throughout the day.  Protect yourself by wearing long sleeves, pants, a wide-brimmed hat, and sunglasses whenever you are outside. Heart  disease, diabetes, and high blood pressure  High blood pressure causes heart disease and increases the risk of stroke. High blood pressure is more likely to develop in:  People who have blood pressure in the high end of the normal range (130-139/85-89 mm Hg).  People who are overweight or obese.  People who are African American.  If you are 11-31 years of age, have your blood pressure checked every 3-5 years. If you are 17 years of age or older, have your blood pressure checked every year. You should have your blood pressure measured twice-once when you are at a hospital or clinic, and once when you are not at a hospital or clinic. Record the average of the two measurements. To check your blood pressure when you are not at a hospital or clinic, you can use:  An automated blood pressure machine at a pharmacy.  A home blood pressure monitor.  If you are between 74 years and 65 years old, ask your health care provider if you should take aspirin to prevent strokes.  Have regular diabetes screenings. This involves taking a blood sample to check your fasting blood sugar level.  If you are at a normal weight and have a low risk for diabetes, have this test once every three years after 37 years of age.  If you are overweight and have a high risk for diabetes, consider being tested at a younger age or more often. Preventing infection Hepatitis B  If you have a higher risk for hepatitis B, you should be screened for this virus. You are considered at high risk for hepatitis B if:  You were born in a country where hepatitis B is common. Ask your health care provider which countries are considered high risk.  Your parents were born in a high-risk country, and you have not been immunized against hepatitis B (hepatitis B vaccine).  You have HIV or AIDS.  You use needles to inject street drugs.  You live with someone who has hepatitis B.  You have had sex with someone who has hepatitis  B.  You get hemodialysis treatment.  You take certain medicines for conditions, including cancer, organ transplantation, and autoimmune conditions. Hepatitis C  Blood testing is recommended for:  Everyone born from [birth year] through 1966.  Anyone with known risk factors for hepatitis C. Sexually transmitted infections (STIs)  You should be screened for sexually transmitted infections (STIs) including gonorrhea and chlamydia if:  You are sexually active and are younger than 37 years of age.  You are older than  37 years of age and your health care provider tells you that you are at risk for this type of infection.  Your sexual activity has changed since you were last screened and you are at an increased risk for chlamydia or gonorrhea. Ask your health care provider if you are at risk.  If you do not have HIV, but are at risk, it may be recommended that you take a prescription medicine daily to prevent HIV infection. This is called pre-exposure prophylaxis (PrEP). You are considered at risk if:  You are sexually active and do not regularly use condoms or know the HIV status of your partner(s).  You take drugs by injection.  You are sexually active with a partner who has HIV. Talk with your health care provider about whether you are at high risk of being infected with HIV. If you choose to begin PrEP, you should first be tested for HIV. You should then be tested every 3 months for as long as you are taking PrEP. Pregnancy  If you are premenopausal and you may become pregnant, ask your health care provider about preconception counseling.  If you may become pregnant, take 400 to 800 micrograms (mcg) of folic acid every day.  If you want to prevent pregnancy, talk to your health care provider about birth control (contraception). Osteoporosis and menopause  Osteoporosis is a disease in which the bones lose minerals and strength with aging. This can result in serious bone fractures. Your  risk for osteoporosis can be identified using a bone density scan.  If you are 19 years of age or older, or if you are at risk for osteoporosis and fractures, ask your health care provider if you should be screened.  Ask your health care provider whether you should take a calcium or vitamin D supplement to lower your risk for osteoporosis.  Menopause may have certain physical symptoms and risks.  Hormone replacement therapy may reduce some of these symptoms and risks. Talk to your health care provider about whether hormone replacement therapy is right for you. Follow these instructions at home:  Schedule regular health, dental, and eye exams.  Stay current with your immunizations.  Do not use any tobacco products including cigarettes, chewing tobacco, or electronic cigarettes.  If you are pregnant, do not drink alcohol.  If you are breastfeeding, limit how much and how often you drink alcohol.  Limit alcohol intake to no more than 1 drink per day for nonpregnant women. One drink equals 12 ounces of beer, 5 ounces of wine, or 1 ounces of hard liquor.  Do not use street drugs.  Do not share needles.  Ask your health care provider for help if you need support or information about quitting drugs.  Tell your health care provider if you often feel depressed.  Tell your health care provider if you have ever been abused or do not feel safe at home. This information is not intended to replace advice given to you by your health care provider. Make sure you discuss any questions you have with your health care provider. Document Released: 07/10/2010 Document Revised: 06/02/2015 Document Reviewed: 09/28/2014 Elsevier Interactive Patient Education  2017 [place] for [employer] Calories are units of energy. Your body needs a certain amount of calories from food to keep you going throughout the day. When you eat more calories than your body needs, your body stores  the extra calories as fat. When you eat fewer calories than your body  needs, your body burns fat to get the energy it needs. Calorie counting means keeping track of how many calories you eat and drink each day. Calorie counting can be helpful if you need to lose weight. If you make sure to eat fewer calories than your body needs, you should lose weight. Ask your health care provider what a healthy weight is for you. For calorie counting to work, you will need to eat the right number of calories in a day in order to lose a healthy amount of weight per week. A dietitian can help you determine how many calories you need in a day and will give you suggestions on how to reach your calorie goal.  A healthy amount of weight to lose per week is usually 1-2 lb (0.5-0.9 kg). This usually means that your daily calorie intake should be reduced by 500-750 calories.  Eating 1,200 - 1,500 calories per day can help most women lose weight.  Eating 1,500 - 1,800 calories per day can help most men lose weight. What is my plan? My goal is to have __________ calories per day. If I have this many calories per day, I should lose around __________ pounds per week. What do I need to know about calorie counting? In order to meet your daily calorie goal, you will need to:  Find out how many calories are in each food you would like to eat. Try to do this before you eat.  Decide how much of the food you plan to eat.  Write down what you ate and how many calories it had. Doing this is called keeping a food log. To successfully lose weight, it is important to balance calorie counting with a healthy lifestyle that includes regular activity. Aim for 150 minutes of moderate exercise (such as walking) or 75 minutes of vigorous exercise (such as running) each week. Where do I find calorie information?   The number of calories in a food can be found on a Nutrition Facts label. If a food does not have a Nutrition Facts label, try  to look up the calories online or ask your dietitian for help. Remember that calories are listed per serving. If you choose to have more than one serving of a food, you will have to multiply the calories per serving by the amount of servings you plan to eat. For example, the label on a package of bread might say that a serving size is 1 slice and that there are 90 calories in a serving. If you eat 1 slice, you will have eaten 90 calories. If you eat 2 slices, you will have eaten 180 calories. How do I keep a food log? Immediately after each meal, record the following information in your food log:  What you ate. Don't forget to include toppings, sauces, and other extras on the food.  How much you ate. This can be measured in cups, ounces, or number of items.  How many calories each food and drink had.  The total number of calories in the meal. Keep your food log near you, such as in a small notebook in your pocket, or use a mobile app or website. Some programs will calculate calories for you and show you how many calories you have left for the day to meet your goal. What are some calorie counting tips?  Use your calories on foods and drinks that will fill you up and not leave you hungry:  Some examples of foods that fill you  up are nuts and nut butters, vegetables, lean proteins, and high-fiber foods like whole grains. High-fiber foods are foods with more than 5 g fiber per serving.  Drinks such as sodas, specialty coffee drinks, alcohol, and juices have a lot of calories, yet do not fill you up.  Eat nutritious foods and avoid empty calories. Empty calories are calories you get from foods or beverages that do not have many vitamins or protein, such as candy, sweets, and soda. It is better to have a nutritious high-calorie food (such as an avocado) than a food with few nutrients (such as a bag of chips).  Know how many calories are in the foods you eat most often. This will help you calculate  calorie counts faster.  Pay attention to calories in drinks. Low-calorie drinks include water and unsweetened drinks.  Pay attention to nutrition labels for "low fat" or "fat free" foods. These foods sometimes have the same amount of calories or more calories than the full fat versions. They also often have added sugar, starch, or salt, to make up for flavor that was removed with the fat.  Find a way of tracking calories that works for you. Get creative. Try different apps or programs if writing down calories does not work for you. What are some portion control tips?  Know how many calories are in a serving. This will help you know how many servings of a certain food you can have.  Use a measuring cup to measure serving sizes. You could also try weighing out portions on a kitchen scale. With time, you will be able to estimate serving sizes for some foods.  Take some time to put servings of different foods on your favorite plates, bowls, and cups so you know what a serving looks like.  Try not to eat straight from a bag or box. Doing this can lead to overeating. Put the amount you would like to eat in a cup or on a plate to make sure you are eating the right portion.  Use smaller plates, glasses, and bowls to prevent overeating.  Try not to multitask (for example, watch TV or use your computer) while eating. If it is time to eat, sit down at a table and enjoy your food. This will help you to know when you are full. It will also help you to be aware of what you are eating and how much you are eating. What are tips for following this plan? Reading food labels   Check the calorie count compared to the serving size. The serving size may be smaller than what you are used to eating.  Check the source of the calories. Make sure the food you are eating is high in vitamins and protein and low in saturated and trans fats. Shopping   Read nutrition labels while you shop. This will help you make  healthy decisions before you decide to purchase your food.  Make a grocery list and stick to it. Cooking   Try to cook your favorite foods in a healthier way. For example, try baking instead of frying.  Use low-fat dairy products. Meal planning   Use more fruits and vegetables. Half of your plate should be fruits and vegetables.  Include lean proteins like poultry and fish. How do I count calories when eating out?  Ask for smaller portion sizes.  Consider sharing an entree and sides instead of getting your own entree.  If you get your own entree, eat only half. Ask  for a box at the beginning of your meal and put the rest of your entree in it so you are not tempted to eat it.  If calories are listed on the menu, choose the lower calorie options.  Choose dishes that include vegetables, fruits, whole grains, low-fat dairy products, and lean protein.  Choose items that are boiled, broiled, grilled, or steamed. Stay away from items that are buttered, battered, fried, or served with cream sauce. Items labeled "crispy" are usually fried, unless stated otherwise.  Choose water, low-fat milk, unsweetened iced tea, or other drinks without added sugar. If you want an alcoholic beverage, choose a lower calorie option such as a glass of wine or light beer.  Ask for dressings, sauces, and syrups on the side. These are usually high in calories, so you should limit the amount you eat.  If you want a salad, choose a garden salad and ask for grilled meats. Avoid extra toppings like bacon, cheese, or fried items. Ask for the dressing on the side, or ask for olive oil and vinegar or lemon to use as dressing.  Estimate how many servings of a food you are given. For example, a serving of cooked rice is  cup or about the size of half a baseball. Knowing serving sizes will help you be aware of how much food you are eating at restaurants. The list below tells you how big or small some common portion sizes  are based on everyday objects:  1 oz-4 stacked dice.  3 oz-1 deck of cards.  1 tsp-1 die.  1 Tbsp- a ping-pong ball.  2 Tbsp-1 ping-pong ball.   cup- baseball.  1 cup-1 baseball. Summary  Calorie counting means keeping track of how many calories you eat and drink each day. If you eat fewer calories than your body needs, you should lose weight.  A healthy amount of weight to lose per week is usually 1-2 lb (0.5-0.9 kg). This usually means reducing your daily calorie intake by 500-750 calories.  The number of calories in a food can be found on a Nutrition Facts label. If a food does not have a Nutrition Facts label, try to look up the calories online or ask your dietitian for help.  Use your calories on foods and drinks that will fill you up, and not on foods and drinks that will leave you hungry.  Use smaller plates, glasses, and bowls to prevent overeating. This information is not intended to replace advice given to you by your health care provider. Make sure you discuss any questions you have with your health care provider. Document Released: 12/25/2004 Document Revised: 11/25/2015 Document Reviewed: 11/25/2015 Elsevier Interactive Patient Education  2017 [employer].   Exercising to [employer] Exercising can help you to lose weight. In order to lose weight through exercise, you need to do vigorous-intensity exercise. You can tell that you are exercising with vigorous intensity if you are breathing very hard and fast and cannot hold a conversation while exercising. Moderate-intensity exercise helps to maintain your current weight. You can tell that you are exercising at a moderate level if you have a higher heart rate and faster breathing, but you are still able to hold a conversation. How often should I exercise? Choose an activity that you enjoy and set realistic goals. Your health care provider can help you to make an activity plan that works for you. Exercise regularly  as directed by your health care provider. This may include:  Doing resistance  training twice each week, such as:  Push-ups.  Sit-ups.  Lifting weights.  Using resistance bands.  Doing a given intensity of exercise for a given amount of time. Choose from these options:  150 minutes of moderate-intensity exercise every week.  75 minutes of vigorous-intensity exercise every week.  A mix of moderate-intensity and vigorous-intensity exercise every week. Children, pregnant women, people who are out of shape, people who are overweight, and older adults may need to consult a health care provider for individual recommendations. If you have any sort of medical condition, be sure to consult your health care provider before starting a new exercise program. What are some activities that can help me to lose weight?  Walking at a rate of at least 4.5 miles an hour.  Jogging or running at a rate of 5 miles per hour.  Biking at a rate of at least 10 miles per hour.  Lap swimming.  Roller-skating or in-line skating.  Cross-country skiing.  Vigorous competitive sports, such as football, basketball, and soccer.  Jumping rope.  Aerobic dancing. How can I be more active in my day-to-day activities?  Use the stairs instead of the elevator.  Take a walk during your lunch break.  If you drive, park your car farther away from work or school.  If you take public transportation, get off one stop early and walk the rest of the way.  Make all of your phone calls while standing up and walking around.  Get up, stretch, and walk around every 30 minutes throughout the day. What guidelines should I follow while exercising?  Do not exercise so much that you hurt yourself, feel dizzy, or get very short of breath.  Consult your health care provider prior to starting a new exercise program.  Wear comfortable clothes and shoes with good support.  Drink plenty of water while you exercise to  prevent dehydration or heat stroke. Body water is lost during exercise and must be replaced.  Work out until you breathe faster and your heart beats faster. This information is not intended to replace advice given to you by your health care provider. Make sure you discuss any questions you have with your health care provider. Document Released: 01/27/2010 Document Revised: 06/02/2015 Document Reviewed: 05/28/2013 Elsevier Interactive Patient Education  2017 [employer].

## 2016-03-13 NOTE — Assessment & Plan Note (Signed)
BMI of 51 with several comorbidities including hyperlipidemia and potentially new diagnosis sleep apnea pending sleep study results. Recommend weight loss of 5-10% of current body weight through nutrition and physical activity changes. Recommend caloric intake of 1200-1500 cal daily. Consider calorie tracking software/applications to assist with management. Investigate weight loss programs including Weight Watchers, Nutrisystem, or Rickard Patience. Goal is also to increase physical activity to 30 minutes of moderate level activity daily. If unsuccessful appears to be a good candidate for potential bariatric surgery. Continue to monitor with conservative treatment and follow-up in one month.

## 2016-03-13 NOTE — Assessment & Plan Note (Addendum)
1) Anticipatory Guidance: Discussed importance of wearing a seatbelt while driving and not texting while driving; changing batteries in smoke detector at least once annually; wearing suntan lotion when outside; eating a balanced and moderate diet; getting physical activity at least 30 minutes per day.  2) Immunizations / Screenings / Labs:  All immunizations are up-to-date per recommendations. Obtain hemoglobin A1c for diabetes screening. Most recently noted to have gestational diabetes with normal A1c. All other screenings are up-to-date per recommendations. Obtain CBC, CMET, and lipid profile.    Overall well exam with risk factors for cardiovascular disease including hyperlipidemia, tobacco use, and obesity. Not currently maintained on medications for hyperlipidemia. Obtain an NMR LipoProfile. Recommend weight loss of 5-10% of current body weight through nutrition and physical activity changes. Goal is to increase physical activity of 30 minutes of moderate level activity daily. Goal is to lose weight and increase health. Continue healthy lifestyle behaviors and choices. Follow-up prevention exam in 1 year. Follow-up office visit in one month and for chronic conditions.

## 2016-03-13 NOTE — Progress Notes (Signed)
Subjective:    Patient ID: [name], female    DOB: 1980-04-23, 36 y.o.   MRN: [id number]  Chief Complaint  Patient presents with  . CPE    Fasting, wants refill of cyclobenzaprine, diazepam, and ibuprofen    HPI:  [name] is a 36 y.o. female who presents today for an annual wellness visit.   1) Health Maintenance -   Diet - Averages about 2 meals per day consisting of a regular diet; Caffeine intake about 2-3 cups per day.   Exercise - No structured exercise   2) Preventative Exams / Immunizations:  Dental -- Up to date  Vision -- Up to date   Health Maintenance  Topic Date Due  . PAP SMEAR  05/09/2018  . TETANUS/TDAP  12/12/2024  . INFLUENZA VACCINE  Addressed  . HIV Screening  Completed    Immunization History  Administered Date(s) Administered  . Influenza Whole 10/30/2011  . Influenza,inj,Quad PF,36+ Mos 10/21/2012  . Influenza-Unspecified 11/02/2015  . Td 01/08/2005     Allergies  Allergen Reactions  . Codeine     STOMACH CRAMPING  . Doxycycline Nausea And Vomiting     Outpatient Medications Prior to Visit  Medication Sig Dispense Refill  . FLUoxetine (PROZAC) 20 MG tablet Take 3 tablets (60 mg total) by mouth every morning. 270 tablet 0  . JUNEL FE 1/20 1-20 MG-MCG tablet Take 1 tablet by mouth every morning.    . cyclobenzaprine (FLEXERIL) 10 MG tablet Take 1 tablet (10 mg total) by mouth 3 (three) times daily as needed for muscle spasms. 30 tablet 0  . diazepam (VALIUM) 5 MG tablet Take 1 tablet (5 mg total) by mouth every 6 (six) hours as needed for anxiety. 10 tablet 0  . ibuprofen (ADVIL,MOTRIN) 600 MG tablet Take 600 mg by mouth every 6 (six) hours as needed for headache, mild pain or moderate pain.     No facility-administered medications prior to visit.      Past Medical History:  Diagnosis Date  . Anxiety   . Cluster headaches   . Depression   . Eczema   . GERD (gastroesophageal reflux disease)   .  Gestational diabetes 2012, 2016  . Hyperlipidemia   . Hypertension    no meds currently  . Sciatica   . Snoring    sleep study 02/2012 with min AHI events     Past Surgical History:  Procedure Laterality Date  . CESAREAN SECTION  09 & 12   x's 2  . CESAREAN SECTION WITH BILATERAL TUBAL LIGATION Bilateral 12/18/2014   Procedure: REPEAT CESAREAN SECTION WITH BILATERAL TUBAL LIGATION;  Surgeon: Crawford Givens, MD;  Location: [facility];  Service: Obstetrics;  Laterality: Bilateral;  . COLPOSCOPY    . DENTAL SURGERY    . Ganglion removal from (L) wrist  1998  . LAPAROSCOPIC UNILATERAL SALPINGECTOMY Left 01/26/2015   Procedure: LAPAROSCOPIC UNILATERAL SALPINGECTOMY;  Surgeon: Crawford Givens, MD;  Location: [facility];  Service: Gynecology;  Laterality: Left;  . WISDOM TOOTH EXTRACTION       Family History  Problem Relation Age of Onset  . Arthritis Mother   . Alcohol abuse Mother   . Mental illness Mother   . Cancer Mother 19    squamous - unknown primary  . Coronary artery disease Mother 16    MI with stent  . Anxiety disorder Mother   . Depression Mother   . Arthritis Father   . Alcohol abuse Father   .  Hyperlipidemia Father   . Heart disease Father   . Kidney disease Father   . Stroke Father   . Mental illness Father   . Drug abuse Father   . Diabetes Maternal Grandmother   . Heart disease Maternal Grandmother   . Stroke Maternal Grandfather   . Hypertension Paternal Grandfather   . Hyperlipidemia Paternal Grandfather   . Arthritis Other   . Alcohol abuse Other   . Breast cancer Paternal Aunt   . Anxiety disorder Sister      Social History   Social History  . Marital status: Married    Spouse name: N/A  . Number of children: 3  . Years of education: 17   Occupational History  . Group Secretary    Social History Main Topics  . Smoking status: Light Tobacco Smoker    Packs/day: 0.10    Years: 15.00    Types: Cigarettes    Last attempt to quit: 04/23/2014  .  Smokeless tobacco: Never Used  . Alcohol use Yes     Comment: once a month or less  . Drug use: No  . Sexual activity: Yes    Birth control/ protection: None   Other Topics Concern  . Not on file   Social History Narrative   Secretary   Married, lives with spouse and 2 kids   Moved to [place] [facility] [place] to be near mom - originally from [place]   Denies abuse and feels safe at home.      Review of Systems  Constitutional: Denies fever, chills, fatigue, or significant weight gain/loss. HENT: Head: Denies headache or neck pain Ears: Denies changes in hearing, ringing in ears, earache, drainage Nose: Denies discharge, stuffiness, itching, nosebleed, sinus pain Throat: Denies sore throat, hoarseness, dry mouth, sores, thrush Eyes: Denies loss/changes in vision, pain, redness, blurry/double vision, flashing lights Cardiovascular: Denies chest pain/discomfort, tightness, palpitations, shortness of breath with activity, difficulty lying down, swelling, sudden awakening with shortness of breath Respiratory: Denies shortness of breath, cough, sputum production, wheezing Gastrointestinal: Denies dysphasia, heartburn, change in appetite, nausea, change in bowel habits, rectal bleeding, constipation, diarrhea, yellow skin or eyes Genitourinary: Denies frequency, urgency, burning/pain, blood in urine, incontinence, change in urinary strength. Musculoskeletal: Denies muscle/joint pain, stiffness, back pain, redness or swelling of joints, trauma Skin: Denies rashes, lumps, itching, dryness, color changes, or hair/nail changes Neurological: Denies dizziness, fainting, seizures, weakness, numbness, tingling, tremor Psychiatric - Denies nervousness, stress, depression or memory loss Endocrine: Denies heat or cold intolerance, sweating, frequent urination, excessive thirst, changes in appetite Hematologic: Denies ease of bruising or bleeding     Objective:    BP 128/80 (BP Location: Left Arm, Patient  Position: Sitting, Cuff Size: Large)   Pulse 84   Temp 98.6 F (37 C) (Oral)   Resp 16   Ht 5\' 2"  (1.575 m)   Wt 281 lb (127.5 kg)   SpO2 97%   BMI 51.40 kg/m  Nursing note and vital signs reviewed.  Wt Readings from Last 3 Encounters:  03/13/16 281 lb (127.5 kg)  03/05/16 285 lb (129.3 kg)  01/17/16 283 lb 6.4 oz (128.5 kg)    Physical Exam  Constitutional: She is oriented to person, place, and time. She appears well-developed and well-nourished.  HENT:  Head: Normocephalic.  Right Ear: Hearing, tympanic membrane, external ear and ear canal normal.  Left Ear: Hearing, tympanic membrane, external ear and ear canal normal.  Nose: Nose normal.  Mouth/Throat: Uvula is midline, oropharynx is clear and  moist and mucous membranes are normal.  Eyes: Conjunctivae and EOM are normal. Pupils are equal, round, and reactive to light.  Neck: Neck supple. No JVD present. No tracheal deviation present. No thyromegaly present.  Cardiovascular: Normal rate, regular rhythm, normal heart sounds and intact distal pulses.   Pulmonary/Chest: Effort normal and breath sounds normal.  Abdominal: Soft. Bowel sounds are normal. She exhibits no distension and no mass. There is no tenderness. There is no rebound and no guarding.  Musculoskeletal: Normal range of motion. She exhibits no edema or tenderness.  Lymphadenopathy:    She has no cervical adenopathy.  Neurological: She is alert and oriented to person, place, and time. She has normal reflexes. No cranial nerve deficit. She exhibits normal muscle tone. Coordination normal.  Skin: Skin is warm and dry.  Psychiatric: She has a normal mood and affect. Her behavior is normal. Judgment and thought content normal.       Assessment & Plan:   Problem List Items Addressed This Visit      Other   Right knee pain   Relevant Medications   cyclobenzaprine (FLEXERIL) 10 MG tablet   Morbid obesity (HCC)    BMI of 51 with several comorbidities including  hyperlipidemia and potentially new diagnosis sleep apnea pending sleep study results. Recommend weight loss of 5-10% of current body weight through nutrition and physical activity changes. Recommend caloric intake of 1200-1500 cal daily. Consider calorie tracking software/applications to assist with management. Investigate weight loss programs including Weight Watchers, Nutrisystem, or Rickard Patience. Goal is also to increase physical activity to 30 minutes of moderate level activity daily. If unsuccessful appears to be a good candidate for potential bariatric surgery. Continue to monitor with conservative treatment and follow-up in one month.      Routine general medical examination at a health care facility - Primary    1) Anticipatory Guidance: Discussed importance of wearing a seatbelt while driving and not texting while driving; changing batteries in smoke detector at least once annually; wearing suntan lotion when outside; eating a balanced and moderate diet; getting physical activity at least 30 minutes per day.  2) Immunizations / Screenings / Labs:  All immunizations are up-to-date per recommendations. Obtain hemoglobin A1c for diabetes screening. Most recently noted to have gestational diabetes with normal A1c. All other screenings are up-to-date per recommendations. Obtain CBC, CMET, and lipid profile.    Overall well exam with risk factors for cardiovascular disease including hyperlipidemia, tobacco use, and obesity. Not currently maintained on medications for hyperlipidemia. Obtain an NMR LipoProfile. Recommend weight loss of 5-10% of current body weight through nutrition and physical activity changes. Goal is to increase physical activity of 30 minutes of moderate level activity daily. Goal is to lose weight and increase health. Continue healthy lifestyle behaviors and choices. Follow-up prevention exam in 1 year. Follow-up office visit in one month and for chronic conditions.      Relevant  Orders   CBC (Completed)   Comprehensive metabolic panel (Completed)   VITAMIN D 25 Hydroxy (Vit-D Deficiency, Fractures) (Completed)   Hemoglobin A1c (Completed)   NMR LipoProf + Graph   GAD (generalized anxiety disorder)   Relevant Medications   diazepam (VALIUM) 5 MG tablet   Hyperlipidemia    Not currently maintained on medication. Obtain lipid profile through NMR LipoProfile. Continue with lifestyle management pending new blood work results.      Nicotine dependence, cigarettes, uncomplicated    Continues tobacco use. Discussed importance of tobacco cessation for reduction  of risk of cardiovascular, respiratory, and malignant diseases in the future. She is currently in the precontemplation stages and not ready to quit. Discussed methods of quitting with information provided and after visit summary for further review. Continue to monitor.          I have changed Ms. Veitch's ibuprofen. I am also having her maintain her JUNEL FE 1/20, FLUoxetine, diazepam, and cyclobenzaprine.   Meds ordered this encounter  Medications  . diazepam (VALIUM) 5 MG tablet    Sig: Take 1 tablet (5 mg total) by mouth every 6 (six) hours as needed for anxiety.    Dispense:  10 tablet    Refill:  0    Order Specific Question:   Supervising Provider    Answer:   Pricilla Holm A [id number]  . cyclobenzaprine (FLEXERIL) 10 MG tablet    Sig: Take 1 tablet (10 mg total) by mouth 3 (three) times daily as needed for muscle spasms.    Dispense:  30 tablet    Refill:  0    Order Specific Question:   Supervising Provider    Answer:   Pricilla Holm A [id number]  . ibuprofen (ADVIL,MOTRIN) 600 MG tablet    Sig: Take 1 tablet (600 mg total) by mouth every 6 (six) hours as needed for headache, mild pain or moderate pain.    Dispense:  90 tablet    Refill:  0    Order Specific Question:   Supervising Provider    Answer:   Pricilla Holm A [id number]     Follow-up: Return in about 1 month (around  04/13/2016), or if symptoms worsen or fail to improve.   Mauricio Po, FNP

## 2016-03-13 NOTE — Assessment & Plan Note (Signed)
Continues tobacco use. Discussed importance of tobacco cessation for reduction of risk of cardiovascular, respiratory, and malignant diseases in the future. She is currently in the precontemplation stages and not ready to quit. Discussed methods of quitting with information provided and after visit summary for further review. Continue to monitor.

## 2016-03-14 ENCOUNTER — Telehealth: Payer: Self-pay | Admitting: Pulmonary Disease

## 2016-03-14 ENCOUNTER — Encounter: Payer: Self-pay | Admitting: Family

## 2016-03-14 DIAGNOSIS — G4733 Obstructive sleep apnea (adult) (pediatric): Secondary | ICD-10-CM

## 2016-03-14 LAB — NMR LIPOPROF + GRAPH
Cholesterol: 234 mg/dL — ABNORMAL HIGH (ref 100–199)
HDL Cholesterol by NMR: 52 mg/dL
HDL Particle Number: 34.9 umol/L
LDL Particle Number: 2029 nmol/L — ABNORMAL HIGH
LDL Size: 21.3 nm
LDL-C: 161 mg/dL — ABNORMAL HIGH (ref 0–99)
LP-IR Score: 66 — ABNORMAL HIGH
Small LDL Particle Number: 388 nmol/L
Triglycerides by NMR: 104 mg/dL (ref 0–149)

## 2016-03-14 NOTE — Telephone Encounter (Signed)
PSG showed mild OSA- AHI 12/h Suggest starting on CPAP therapy to se how much she improves If agreeable, pl send to DME autoCPAP 5-15 cm, nasal pillows, humidity download & FU in 4 wks

## 2016-03-14 NOTE — Telephone Encounter (Signed)
Spoke with patient regarding CPAP. She has agreed to use CPAP. Order has been placed. Advised her to call back once she has been setup for CPAP.

## 2016-03-14 NOTE — Procedures (Signed)
Patient Name: [name], [name] Date: 03/05/2016 Gender: Female D.O.B: Jul 08, 1980 Age (years): 35 Referring Provider: Kara Mead MD, ABSM Height (inches): 62 Interpreting Physician: Kara Mead MD, ABSM Weight (lbs): 285 RPSGT: Baxter Flattery BMI: 52 MRN: [id number] Neck Size: 15.50   CLINICAL INFORMATION Sleep Study Type: NPSG  Indication for sleep study: Fatigue, Obesity, Snoring  Epworth Sleepiness Score: 15  SLEEP STUDY TECHNIQUE As per the AASM Manual for the Scoring of Sleep and Associated Events v2.3 (April 2016) with a hypopnea requiring 4% desaturations.  The channels recorded and monitored were frontal, central and occipital EEG, electrooculogram (EOG), submentalis EMG (chin), nasal and oral airflow, thoracic and abdominal wall motion, anterior tibialis EMG, snore microphone, electrocardiogram, and pulse oximetry.  SLEEP ARCHITECTURE The study was initiated at 10:07:20 PM and ended at 5:15:22 AM.  Sleep onset time was 25.8 minutes and the sleep efficiency was 79.6%. The total sleep time was 340.7 minutes.  Stage REM latency was 148.5 minutes.  The patient spent 7.78% of the night in stage N1 sleep, 79.60% in stage N2 sleep, 0.00% in stage N3 and 12.62% in REM.  Alpha intrusion was absent.  Supine sleep was 43.30%.  RESPIRATORY PARAMETERS The overall apnea/hypopnea index (AHI) was 12.0 per hour. There were 42 total apneas, including 42 obstructive, 0 central and 0 mixed apneas. There were 26 hypopneas and 11 RERAs.  The AHI during Stage REM sleep was 41.9 per hour.  AHI while supine was 12.6 per hour.  The mean oxygen saturation was 93.94%. The minimum SpO2 during sleep was 79.00%.  Moderate snoring was noted during this study.  CARDIAC DATA The 2 lead EKG demonstrated sinus rhythm. The mean heart rate was N/A beats per minute. Other EKG findings include: None.   LEG MOVEMENT DATA The total PLMS were 0 with a resulting PLMS index of 0.00. Associated  arousal with leg movement index was 0.0 .  IMPRESSIONS - Mild obstructive sleep apnea occurred during this study (AHI = 12.0/h), predominant during REM sleep. - No significant central sleep apnea occurred during this study (CAI = 0.0/h). - Moderate oxygen desaturation was noted during this study (Min O2 = 79.00%). - The patient snored with Moderate snoring volume. - No cardiac abnormalities were noted during this study. - Clinically significant periodic limb movements did not occur during sleep. No significant associated arousals.   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 - Consider oral bite guard for bruxism - Avoid alcohol, sedatives and other CNS depressants that may worsen sleep apnea and disrupt normal sleep architecture. - Sleep hygiene should be reviewed to assess factors that may improve sleep quality. - Weight management and regular exercise should be initiated or continued if appropriate.    Kara Mead MD Board Certified in [place]

## 2016-03-19 ENCOUNTER — Ambulatory Visit (HOSPITAL_COMMUNITY): Payer: Self-pay | Admitting: Psychology

## 2016-03-21 ENCOUNTER — Telehealth: Payer: Self-pay | Admitting: Pulmonary Disease

## 2016-03-21 NOTE — Telephone Encounter (Signed)
Pt states she needs a letter stating that she has been dx'd with OSA (waiting for approval for CPAP) and that she is under RA's care.   Please call patient prior to faxing-Fax # [phone]

## 2016-03-21 NOTE — Telephone Encounter (Signed)
Ok to give letter

## 2016-03-22 ENCOUNTER — Encounter: Payer: Self-pay | Admitting: *Deleted

## 2016-03-22 NOTE — Telephone Encounter (Signed)
Letter has been written. Pt is aware. It has been faxed. Nothing further was needed.

## 2016-04-02 ENCOUNTER — Ambulatory Visit (INDEPENDENT_AMBULATORY_CARE_PROVIDER_SITE_OTHER): Payer: Federal, State, Local not specified - PPO | Admitting: Psychology

## 2016-04-02 DIAGNOSIS — F330 Major depressive disorder, recurrent, mild: Secondary | ICD-10-CM

## 2016-04-02 NOTE — Progress Notes (Signed)
   THERAPIST PROGRESS NOTE  Session Time: 12.35pm-1.25pm  Participation Level: Active  Behavioral Response: Well GroomedAlertexhausted  Type of Therapy: Individual Therapy  Treatment Goals addressed: Diagnosis: MDD  and goal 1.  Interventions: CBT and Supportive  Summary: [name] is a 36 y.o. female who presents with affect wnl.  Pt reports she is still feeling very fatigued.  Pt reported that she sleep study did show sleep apnea and that she woke 48 times in 7 hours.  Pt reported that she is ready for her CPAP machine and to get good sleep. Pt does report that she feels good that she has accomplished 2 household goals she has had for awhile. Pt feels good about this. Pt reported that she and husband were talking about money from student loans she can't account for and believes her mom took money to support household expenses.  Pt discussed how she has thought if went to benefit siblings and whether they knew- pt was able to acknowledge making assumptions and that could lead to placing blame where doesn't belong.  Pt discussed positive interactions w/ her daughter for her birthday and enjoying family interactions-despite challenges.  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     Suicidal/Homicidal: Nowithout intent/plan  Therapist Response: Assessed pt current functioning per pt report. Processed w/pt coping w/ lack of restorative sleep and having answers about- change perspective.  Discussed assumptions and how can impact how she views things.  Explored family interactions and work stressor.   Plan: Return again in 2 weeks.  Diagnosis: MDD, recurrent, mild   Ruby Logiudice, LPC 04/02/2016

## 2016-04-04 ENCOUNTER — Telehealth: Payer: Self-pay | Admitting: Pulmonary Disease

## 2016-04-04 NOTE — Telephone Encounter (Signed)
Rec'd letter requesting FMLA forms to be completed via fax- pt also include the FMLA forms - faxed to [employer] -pr

## 2016-04-05 DIAGNOSIS — G4733 Obstructive sleep apnea (adult) (pediatric): Secondary | ICD-10-CM | POA: Diagnosis not present

## 2016-04-12 ENCOUNTER — Ambulatory Visit (INDEPENDENT_AMBULATORY_CARE_PROVIDER_SITE_OTHER): Payer: Federal, State, Local not specified - PPO | Admitting: Psychiatry

## 2016-04-12 ENCOUNTER — Encounter (HOSPITAL_COMMUNITY): Payer: Self-pay | Admitting: Psychiatry

## 2016-04-12 DIAGNOSIS — Z818 Family history of other mental and behavioral disorders: Secondary | ICD-10-CM | POA: Diagnosis not present

## 2016-04-12 DIAGNOSIS — Z79899 Other long term (current) drug therapy: Secondary | ICD-10-CM

## 2016-04-12 DIAGNOSIS — F4010 Social phobia, unspecified: Secondary | ICD-10-CM | POA: Diagnosis not present

## 2016-04-12 DIAGNOSIS — F330 Major depressive disorder, recurrent, mild: Secondary | ICD-10-CM | POA: Diagnosis not present

## 2016-04-12 DIAGNOSIS — F17210 Nicotine dependence, cigarettes, uncomplicated: Secondary | ICD-10-CM

## 2016-04-12 DIAGNOSIS — Z888 Allergy status to other drugs, medicaments and biological substances status: Secondary | ICD-10-CM

## 2016-04-12 DIAGNOSIS — F411 Generalized anxiety disorder: Secondary | ICD-10-CM | POA: Diagnosis not present

## 2016-04-12 DIAGNOSIS — Z813 Family history of other psychoactive substance abuse and dependence: Secondary | ICD-10-CM

## 2016-04-12 DIAGNOSIS — Z811 Family history of alcohol abuse and dependence: Secondary | ICD-10-CM

## 2016-04-12 MED ORDER — FLUOXETINE HCL 20 MG PO TABS: 60.0000 mg | ORAL_TABLET | ORAL | 0 refills | Status: DC

## 2016-04-12 NOTE — Progress Notes (Signed)
BH MD/PA/NP OP Progress Note  04/12/2016 8:47 AM MARKIE HEFFERNAN  MRN:  [id number]  Chief Complaint:  Chief Complaint    Follow-up      HPI: Pt states she has been diagnosed with sleep apnea and is now using her CPAP. Sleep and energy have significantly improved.   Depression is improving. She is having less frequent episodes and is down about 1-2 days a week. On those days she is triggered by a stressor.  Reports she is more irritable on those days. Denies crying spells.  States that anhedonia is improving. Denies SI/HI.  Anxiety is improving. She is not feeling anxious on a daily basis. States her stress tolerance remains low. When a stressor comes on she is anxious, irritable, SOB and with racing thoughts. Pt responds by isolating in her bed. Pt is not taking Valium.   Social anxiety is improving. She in no longer uncomfortable going to stores.  Taking meds as prescribed and denies SE.     Visit Diagnosis:    ICD-9-CM ICD-10-CM   1. MDD (major depressive disorder), recurrent episode, mild (HCC) 296.31 F33.0 FLUoxetine (PROZAC) 20 MG tablet  2. GAD (generalized anxiety disorder) 300.02 F41.1 FLUoxetine (PROZAC) 20 MG tablet  3. Social anxiety disorder 300.23 F40.10 FLUoxetine (PROZAC) 20 MG tablet    Past Psychiatric History: see H&P  Past Medical History:  Past Medical History:  Diagnosis Date  . Anxiety   . Cluster headaches   . Depression   . Eczema   . GERD (gastroesophageal reflux disease)   . Gestational diabetes 2012, 2016  . Hyperlipidemia   . Hypertension    no meds currently  . Sciatica   . Snoring    sleep study 02/2012 with min AHI events    Past Surgical History:  Procedure Laterality Date  . CESAREAN SECTION  09 & 12   x's 2  . CESAREAN SECTION WITH BILATERAL TUBAL LIGATION Bilateral 12/18/2014   Procedure: REPEAT CESAREAN SECTION WITH BILATERAL TUBAL LIGATION;  Surgeon: Crawford Givens, MD;  Location: [facility];  Service: Obstetrics;  Laterality:  Bilateral;  . COLPOSCOPY    . DENTAL SURGERY    . Ganglion removal from (L) wrist  1998  . LAPAROSCOPIC UNILATERAL SALPINGECTOMY Left 01/26/2015   Procedure: LAPAROSCOPIC UNILATERAL SALPINGECTOMY;  Surgeon: Crawford Givens, MD;  Location: [facility];  Service: Gynecology;  Laterality: Left;  . WISDOM TOOTH EXTRACTION      Family Psychiatric History:  Family History  Problem Relation Age of Onset  . Arthritis Mother   . Alcohol abuse Mother   . Mental illness Mother   . Cancer Mother 75    squamous - unknown primary  . Coronary artery disease Mother 83    MI with stent  . Anxiety disorder Mother   . Depression Mother   . Arthritis Father   . Alcohol abuse Father   . Hyperlipidemia Father   . Heart disease Father   . Kidney disease Father   . Stroke Father   . Mental illness Father   . Drug abuse Father   . Diabetes Maternal Grandmother   . Heart disease Maternal Grandmother   . Stroke Maternal Grandfather   . Hypertension Paternal Grandfather   . Hyperlipidemia Paternal Grandfather   . Arthritis Other   . Alcohol abuse Other   . Breast cancer Paternal Aunt   . Anxiety disorder Sister     Social History:  Social History   Social History  . Marital status: Married  Spouse name: N/A  . Number of children: 3  . Years of education: 75   Occupational History  . Group Secretary    Social History Main Topics  . Smoking status: Light Tobacco Smoker    Packs/day: 0.10    Years: 15.00    Types: Cigarettes    Last attempt to quit: 04/23/2014  . Smokeless tobacco: Never Used  . Alcohol use Yes     Comment: once a month or less  . Drug use: No  . Sexual activity: Yes    Birth control/ protection: None   Other Topics Concern  . None   Social History Armed forces technical officer   Married, lives with spouse and 2 kids   Moved to [facility] summer 2011 to be near mom - originally from [place]   Denies abuse and feels safe at home.    Allergies:  Allergies  Allergen Reactions  .  Codeine     STOMACH CRAMPING  . Doxycycline Nausea And Vomiting    Metabolic Disorder Labs: Lab Results  Component Value Date   HGBA1C 5.8 03/13/2016   No results found for: PROLACTIN Lab Results  Component Value Date   CHOL 234 (H) 03/13/2016   TRIG 104 03/13/2016   HDL 52 03/13/2016   CHOLHDL 6 02/09/2015   VLDL 30.2 02/09/2015   LDLCALC 152 (H) 02/09/2015   LDLCALC 133 (H) 01/07/2014     Current Medications: Current Outpatient Prescriptions  Medication Sig Dispense Refill  . cyclobenzaprine (FLEXERIL) 10 MG tablet Take 1 tablet (10 mg total) by mouth 3 (three) times daily as needed for muscle spasms. 30 tablet 0  . diazepam (VALIUM) 5 MG tablet Take 1 tablet (5 mg total) by mouth every 6 (six) hours as needed for anxiety. 10 tablet 0  . FLUoxetine (PROZAC) 20 MG tablet Take 3 tablets (60 mg total) by mouth every morning. 270 tablet 0  . ibuprofen (ADVIL,MOTRIN) 600 MG tablet Take 1 tablet (600 mg total) by mouth every 6 (six) hours as needed for headache, mild pain or moderate pain. 90 tablet 0  . JUNEL FE 1/20 1-20 MG-MCG tablet Take 1 tablet by mouth every morning.     No current facility-administered medications for this visit.       Musculoskeletal: Strength & Muscle Tone: within normal limits Gait & Station: normal Patient leans: N/A  Psychiatric Specialty Exam: Review of Systems  Respiratory: Negative for cough, sputum production, shortness of breath and wheezing.   Gastrointestinal: Negative for abdominal pain, heartburn, nausea and vomiting.  Neurological: Negative for dizziness, tremors, sensory change, seizures, loss of consciousness and headaches.  Psychiatric/Behavioral: Positive for depression. Negative for hallucinations, substance abuse and suicidal ideas. The patient is nervous/anxious. The patient does not have insomnia.     Blood pressure 132/80, pulse 94, height 5\' 2"  (1.575 m), weight 285 lb (129.3 kg), unknown if currently breastfeeding.Body  mass index is 52.13 kg/m.  General Appearance: Fairly Groomed  Eye Contact:  Good  Speech:  Clear and Coherent and Normal Rate  Volume:  Normal  Mood:  Euthymic  Affect:  Appropriate and Congruent  Thought Process:  Goal Directed and Descriptions of Associations: Intact  Orientation:  Full (Time, Place, and Person)  Thought Content: Logical   Suicidal Thoughts:  No  Homicidal Thoughts:  No  Memory:  Immediate;   Good Recent;   Good Remote;   Good  Judgement:  Good  Insight:  Good  Psychomotor Activity:  Normal  Concentration:  Concentration:  Good and Attention Span: Good  Recall:  Good  Fund of Knowledge: Good  Language: Good  Akathisia:  No  Handed:  Right  AIMS (if indicated):  n/a  Assets:  Communication Skills Desire for Improvement  ADL's:  Intact  Cognition: WNL  Sleep:  good     Treatment Plan Summary:Medication management  Assessment: MDD- recurrent, moderate; GAD; Social anxiety disorder   Medication management with supportive therapy. Risks/benefits and SE of the medication discussed. Pt verbalized understanding and verbal consent obtained for treatment.  Affirm with the patient that the medications are taken as ordered. Patient expressed understanding of how their medications were to be used.   Meds: continue Prozac 60mg  po qD for depression and anxiety   Labs: 03/13/16 CMP WNL, chol 234, CBC WNL,    Therapy: brief supportive therapy provided. Discussed psychosocial stressors in detail.     Consultations:  Encouraged to continue individual therapy with LeAnn  Pt denies SI and is at an acute low risk for suicide. Patient told to call clinic if any problems occur. Patient advised to go to ER if they should develop SI/HI, side effects, or if symptoms worsen. Has crisis numbers to call if needed. Pt verbalized understanding.  F/up in 3 months or sooner if needed   Charlcie Cradle, MD 04/12/2016, 8:47 AM

## 2016-04-16 ENCOUNTER — Ambulatory Visit: Payer: Federal, State, Local not specified - PPO | Admitting: Family

## 2016-04-16 ENCOUNTER — Ambulatory Visit (INDEPENDENT_AMBULATORY_CARE_PROVIDER_SITE_OTHER): Payer: Federal, State, Local not specified - PPO | Admitting: Psychology

## 2016-04-16 DIAGNOSIS — F330 Major depressive disorder, recurrent, mild: Secondary | ICD-10-CM
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
## 2016-04-30 ENCOUNTER — Ambulatory Visit (HOSPITAL_COMMUNITY): Payer: Self-pay | Admitting: Psychology

## 2016-05-06 DIAGNOSIS — G4733 Obstructive sleep apnea (adult) (pediatric): Secondary | ICD-10-CM | POA: Diagnosis not present

## 2016-05-10 ENCOUNTER — Encounter: Payer: Self-pay | Admitting: Pulmonary Disease

## 2016-05-14 ENCOUNTER — Ambulatory Visit (INDEPENDENT_AMBULATORY_CARE_PROVIDER_SITE_OTHER): Payer: Federal, State, Local not specified - PPO | Admitting: Pulmonary Disease

## 2016-05-14 ENCOUNTER — Encounter: Payer: Self-pay | Admitting: Pulmonary Disease

## 2016-05-14 ENCOUNTER — Ambulatory Visit (INDEPENDENT_AMBULATORY_CARE_PROVIDER_SITE_OTHER): Payer: Federal, State, Local not specified - PPO | Admitting: Psychology

## 2016-05-14 DIAGNOSIS — Z9989 Dependence on other enabling machines and devices: Secondary | ICD-10-CM | POA: Diagnosis not present

## 2016-05-14 DIAGNOSIS — F411 Generalized anxiety disorder: Secondary | ICD-10-CM | POA: Diagnosis not present

## 2016-05-14 DIAGNOSIS — F330 Major depressive disorder, recurrent, mild: Secondary | ICD-10-CM | POA: Diagnosis not present

## 2016-05-14 DIAGNOSIS — G4733 Obstructive sleep apnea (adult) (pediatric): Secondary | ICD-10-CM

## 2016-05-14 NOTE — Addendum Note (Signed)
Addended by: Valerie Salts on: 05/14/2016 11:42 AM   Modules accepted: Orders

## 2016-05-14 NOTE — Progress Notes (Signed)
   THERAPIST PROGRESS NOTE  Session Time: 12.50pm-1.25pm  Participation Level: Active  Behavioral Response: Well GroomedAlertaffect bright  Type of Therapy: Individual Therapy  Treatment Goals addressed: Diagnosis: MDD, GAd and goal 1.  Interventions: CBT and Supportive  Summary: [name] is a 36 y.o. female who presents with full and bright affect.  Pt is late to today's session as son's pediatric appointment ran over.  Pt reported that w/ her CPAP machine she is getting better sleep and feels well rested.  Pt reported that work is going well- she is feeling more challenged learning new things and this is positive.  Pt reports positive interactions w/ husband- date night and w/ children sharing activities w/ them.  Pt reports still strained w/ sister- sister's actions indicate want to return to previous interactions- pt states not the same and comfortable w/ current boundaries.  Pt reported that she is getting more involved w/her daughters's girl scout troop and making connections w/ adults..   Suicidal/Homicidal: Nowithout intent/plan  Therapist Response: Assessed pt current functioning per pt report.t  Processed w/pt current improvement w/ improved sleep.  Explored w/pt positive interactions and opportunities for new connections.   Plan: Return again in 2 weeks.  Diagnosis: MDD, mild    YATES,LEANNE, LPC 05/14/2016

## 2016-05-14 NOTE — Patient Instructions (Signed)
You're doing well on auto CPAP settings We will adjust pressure Weight loss encouraged

## 2016-05-14 NOTE — Assessment & Plan Note (Signed)
Encouraged her to start an exercise program to lose weight now that she has decreased fatigue

## 2016-05-14 NOTE — Progress Notes (Signed)
   Subjective:    Patient ID: [name], female    DOB: [birth year], 36 y.o.   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 She also has depression and is maintained on fluoxetine She had a dental appliance but this did not help. She underwent another sleep study which showed AHI 12/hour and was placed on auto CPAP settings 5-15 cm. She has noted dramatic improvement in her daytime somnolence, snoring and fatigue she feels much refreshed after she wakes up. She has adjusted well to CPAP with nasal pillows and tolerates pressure quite well. Download shows average pressure of 11 cm with good control of events and minimal leak and excellent compliance over the last month  She feels like she has more energy and is able to do more around the house.  Significant tests/ events reviewed   PSG  02/2012 - 263 pounds. Total sleep time was 350 minutes, AHI was 1.2/hour with few RERAs. 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  PSG 02/2016 AHI 12/h       Review of Systems Patient denies significant dyspnea,cough, hemoptysis,  chest pain, palpitations, pedal edema, orthopnea, paroxysmal nocturnal dyspnea, lightheadedness, nausea, vomiting, abdominal or  leg pains      Objective:   Physical Exam   Gen. Pleasant, obese, in no distress ENT - no lesions, no post nasal drip Neck: No JVD, no thyromegaly, no carotid bruits Lungs: no use of accessory muscles, no dullness to percussion, decreased without rales or rhonchi  Cardiovascular: Rhythm regular, heart sounds  normal, no murmurs or gallops, no peripheral edema Musculoskeletal: No deformities, no cyanosis or clubbing , no tremors        Assessment & Plan:

## 2016-05-14 NOTE — Assessment & Plan Note (Signed)
She is doing really well with significant improvement in her daytime somnolence and fatigue. CPAP is really helped her. We will adjust settings to auto5-12 centimeters  Weight loss encouraged, compliance with goal of at least 4-6 hrs every night is the expectation. Advised against medications with sedative side effects Cautioned against driving when sleepy - understanding that sleepiness will vary on a day to day basis

## 2016-05-28 ENCOUNTER — Ambulatory Visit (HOSPITAL_COMMUNITY): Payer: Self-pay | Admitting: Psychology

## 2016-06-05 ENCOUNTER — Encounter (HOSPITAL_COMMUNITY): Payer: Self-pay | Admitting: Psychology

## 2016-06-05 ENCOUNTER — Ambulatory Visit (HOSPITAL_COMMUNITY): Payer: Self-pay | Admitting: Psychology

## 2016-06-05 DIAGNOSIS — G4733 Obstructive sleep apnea (adult) (pediatric): Secondary | ICD-10-CM | POA: Diagnosis not present

## 2016-06-05 NOTE — Progress Notes (Signed)
[name] is a 36 y.o. female patient who didn't show for her appointment.  Letter sent.        [name], LPC

## 2016-06-18 ENCOUNTER — Ambulatory Visit (HOSPITAL_COMMUNITY): Payer: Self-pay | Admitting: Psychology

## 2016-07-02 ENCOUNTER — Ambulatory Visit (INDEPENDENT_AMBULATORY_CARE_PROVIDER_SITE_OTHER): Payer: Federal, State, Local not specified - PPO | Admitting: Psychology

## 2016-07-02 DIAGNOSIS — F330 Major depressive disorder, recurrent, mild: Secondary | ICD-10-CM | POA: Diagnosis not present
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
## 2016-07-06 DIAGNOSIS — G4733 Obstructive sleep apnea (adult) (pediatric): Secondary | ICD-10-CM | POA: Diagnosis not present

## 2016-07-16 ENCOUNTER — Ambulatory Visit (INDEPENDENT_AMBULATORY_CARE_PROVIDER_SITE_OTHER): Payer: Federal, State, Local not specified - PPO | Admitting: Psychology

## 2016-07-16 DIAGNOSIS — F330 Major depressive disorder, recurrent, mild: Secondary | ICD-10-CM | POA: Diagnosis not present

## 2016-07-16 NOTE — Progress Notes (Signed)
   THERAPIST PROGRESS NOTE  Session Time: 12.34pm-1.25pm  Participation Level: Active  Behavioral Response: Well GroomedAlertaffect wnl  Type of Therapy: Individual Therapy  Treatment Goals addressed: Diagnosis: MDd and goal 1.  Interventions: CBT and Supportive  Summary: [name] is a 36 y.o. female who presents with affect WNL.  Pt reported feeling tired- using CPAP but past 1.5 weeks difficulty falling asleep.  Pt reports nothing particular ruminating on or focused on- not sure if particular stress impacting.  Pt disucssed life stressors w/ money, work and home balance.  Pt aware of need for time as couple and for self- but w/out supports to watch kids barrier to this.  Pt discussed some self care measures she is planning to take for her wellness.   Suicidal/Homicidal: Nowithout intent/plan  Therapist Response: Assessed pt current functioning per pt report.  Processed w/pt decreased sleep- any stressor impacting and measures to take to assist w/ wellness.  Plan: Return again in 2 weeks.  Diagnosis: MDD    [name], [facility] 07/16/2016

## 2016-07-19 ENCOUNTER — Ambulatory Visit (HOSPITAL_COMMUNITY): Payer: Federal, State, Local not specified - PPO | Admitting: Psychiatry

## 2016-07-26 ENCOUNTER — Ambulatory Visit (INDEPENDENT_AMBULATORY_CARE_PROVIDER_SITE_OTHER): Payer: Federal, State, Local not specified - PPO | Admitting: Psychiatry

## 2016-07-26 ENCOUNTER — Encounter (HOSPITAL_COMMUNITY): Payer: Self-pay | Admitting: Psychiatry

## 2016-07-26 DIAGNOSIS — Z818 Family history of other mental and behavioral disorders: Secondary | ICD-10-CM

## 2016-07-26 DIAGNOSIS — F4010 Social phobia, unspecified: Secondary | ICD-10-CM

## 2016-07-26 DIAGNOSIS — F330 Major depressive disorder, recurrent, mild: Secondary | ICD-10-CM | POA: Diagnosis not present

## 2016-07-26 DIAGNOSIS — Z811 Family history of alcohol abuse and dependence: Secondary | ICD-10-CM | POA: Diagnosis not present

## 2016-07-26 DIAGNOSIS — G4700 Insomnia, unspecified: Secondary | ICD-10-CM | POA: Diagnosis not present

## 2016-07-26 DIAGNOSIS — Z79899 Other long term (current) drug therapy: Secondary | ICD-10-CM | POA: Diagnosis not present

## 2016-07-26 DIAGNOSIS — R443 Hallucinations, unspecified: Secondary | ICD-10-CM

## 2016-07-26 DIAGNOSIS — F411 Generalized anxiety disorder: Secondary | ICD-10-CM | POA: Diagnosis not present

## 2016-07-26 DIAGNOSIS — Z813 Family history of other psychoactive substance abuse and dependence: Secondary | ICD-10-CM

## 2016-07-26 DIAGNOSIS — Z87891 Personal history of nicotine dependence: Secondary | ICD-10-CM

## 2016-07-26 DIAGNOSIS — F1099 Alcohol use, unspecified with unspecified alcohol-induced disorder: Secondary | ICD-10-CM

## 2016-07-26 MED ORDER — FLUOXETINE HCL 20 MG PO TABS: 60.0000 mg | ORAL_TABLET | ORAL | 0 refills | Status: DC

## 2016-07-26 NOTE — Progress Notes (Signed)
BH MD/PA/NP OP Progress Note  07/26/2016 9:30 AM [name]  MRN:  [id number]  Chief Complaint:  Chief Complaint    Follow-up      HPI: Pt states she having an issue at work that is causing a lot of anxiety. She states it is not been handled well and she doesn't know what to do. Pt reports racing thoughts and insomnia since the process began.  Pt denies depression. She denies anhedonia and isolation. Pt denies SI/HI.   Pt quit smoking and is now eating more carbs.  Taking meds as prescribed and denies SE.    Visit Diagnosis:    ICD-10-CM   1. MDD (major depressive disorder), recurrent episode, mild (HCC) F33.0 FLUoxetine (PROZAC) 20 MG tablet  2. GAD (generalized anxiety disorder) F41.1 FLUoxetine (PROZAC) 20 MG tablet  3. Social anxiety disorder F40.10 FLUoxetine (PROZAC) 20 MG tablet      Past Psychiatric History:  Hospitalizations- denies SIB/SA-denies but has has SI with plans in the past, denies SIB Meds: Paxil- effective but stopped during pregnancy, Celexa-ineffective, Zoloft-ineffective  Past Medical History:  Past Medical History:  Diagnosis Date  . Anxiety   . Cluster headaches   . Depression   . Eczema   . GERD (gastroesophageal reflux disease)   . Gestational diabetes 2012, 2016  . Hyperlipidemia   . Hypertension    no meds currently  . Sciatica   . Sleep apnea   . Snoring    sleep study 02/2012 with min AHI events    Past Surgical History:  Procedure Laterality Date  . CESAREAN SECTION  09 & 12   x's 2  . CESAREAN SECTION WITH BILATERAL TUBAL LIGATION Bilateral 12/18/2014   Procedure: REPEAT CESAREAN SECTION WITH BILATERAL TUBAL LIGATION;  Surgeon: Crawford Givens, MD;  Location: [facility];  Service: Obstetrics;  Laterality: Bilateral;  . COLPOSCOPY    . DENTAL SURGERY    . Ganglion removal from (L) wrist  1998  . LAPAROSCOPIC UNILATERAL SALPINGECTOMY Left 01/26/2015   Procedure: LAPAROSCOPIC UNILATERAL SALPINGECTOMY;  Surgeon: Crawford Givens,  MD;  Location: [facility];  Service: Gynecology;  Laterality: Left;  . WISDOM TOOTH EXTRACTION      Family Psychiatric History:  Family History  Problem Relation Age of Onset  . Arthritis Mother   . Alcohol abuse Mother   . Mental illness Mother   . Cancer Mother 35       squamous - unknown primary  . Coronary artery disease Mother 73       MI with stent  . Anxiety disorder Mother   . Depression Mother   . Arthritis Father   . Alcohol abuse Father   . Hyperlipidemia Father   . Heart disease Father   . Kidney disease Father   . Stroke Father   . Mental illness Father   . Drug abuse Father   . Diabetes Maternal Grandmother   . Heart disease Maternal Grandmother   . Stroke Maternal Grandfather   . Hypertension Paternal Grandfather   . Hyperlipidemia Paternal Grandfather   . Arthritis Other   . Alcohol abuse Other   . Breast cancer Paternal Aunt   . Anxiety disorder Sister     Social History:  Social History   Social History  . Marital status: Married    Spouse name: N/A  . Number of children: 3  . Years of education: 52   Occupational History  . Group Secretary    Social History Main Topics  . Smoking  status: Former Smoker    Packs/day: 0.10    Years: 15.00    Types: Cigarettes    Quit date: 06/24/2016  . Smokeless tobacco: Never Used  . Alcohol use Yes     Comment: once a month or less  . Drug use: No  . Sexual activity: Yes    Birth control/ protection: None   Other Topics Concern  . 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    Allergies:  Allergies  Allergen Reactions  . Codeine     STOMACH CRAMPING  . Doxycycline Nausea And Vomiting    Metabolic Disorder Labs: Lab Results  Component Value Date   HGBA1C 5.8 03/13/2016   No results found for: PROLACTIN Lab Results  Component Value Date   CHOL 234  (H) 03/13/2016   TRIG 104 03/13/2016   HDL 52 03/13/2016   CHOLHDL 6 02/09/2015   VLDL 30.2 02/09/2015   LDLCALC 152 (H) 02/09/2015   LDLCALC 133 (H) 01/07/2014     Current Medications: Current Outpatient Prescriptions  Medication Sig Dispense Refill  . cyclobenzaprine (FLEXERIL) 10 MG tablet Take 1 tablet (10 mg total) by mouth 3 (three) times daily as needed for muscle spasms. 30 tablet 0  . diazepam (VALIUM) 5 MG tablet Take 1 tablet (5 mg total) by mouth every 6 (six) hours as needed for anxiety. 10 tablet 0  . FLUoxetine (PROZAC) 20 MG tablet Take 3 tablets (60 mg total) by mouth every morning. 270 tablet 0  . ibuprofen (ADVIL,MOTRIN) 600 MG tablet Take 1 tablet (600 mg total) by mouth every 6 (six) hours as needed for headache, mild pain or moderate pain. 90 tablet 0  . JUNEL FE 1/20 1-20 MG-MCG tablet Take 1 tablet by mouth every morning.     No current facility-administered medications for this visit.      Musculoskeletal: Strength & Muscle Tone: within normal limits Gait & Station: normal Patient leans: N/A  Psychiatric Specialty Exam: Review of Systems  Eyes: Negative for blurred vision, double vision, photophobia and pain.  Musculoskeletal: Positive for joint pain. Negative for back pain and neck pain.  Psychiatric/Behavioral: Negative for depression, hallucinations, substance abuse and suicidal ideas. The patient is nervous/anxious and has insomnia.     Blood pressure 122/84, pulse 93, height 5\' 3"  (1.6 m), weight 288 lb 3.2 oz (130.7 kg), unknown if currently breastfeeding.Body mass index is 51.05 kg/m.  General Appearance: Fairly Groomed  Eye Contact:  Good  Speech:  Clear and Coherent and Normal Rate  Volume:  Normal  Mood:  Anxious  Affect:  Congruent  Thought Process:  Coherent and Descriptions of Associations: Circumstantial  Orientation:  Full (Time, Place, and Person)  Thought Content: Rumination   Suicidal Thoughts:  No  Homicidal Thoughts:  No   Memory:  Immediate;   Good Recent;   Good Remote;   Good  Judgement:  Good  Insight:  Good  Psychomotor Activity:  Normal  Concentration:  Concentration: Good and Attention Span: Good  Recall:  Good  Fund of Knowledge: Good  Language: Good  Akathisia:  No  Handed:  Right  AIMS (if indicated):  n/a  Assets:  Communication Skills Desire for [employer] Talents/Skills Transportation Vocational/Educational  ADL's:  Intact  Cognition: WNL  Sleep:  fair     Treatment Plan Summary:Medication management  Assessment: MDD-recurrent, moderate; GAD; Social anxiety disorder   Medication management with supportive therapy. Risks/benefits and SE of the medication discussed. Pt verbalized understanding and verbal consent obtained for treatment.  Affirm with the patient that the medications are taken as ordered. Patient expressed understanding of how their medications were to be used.    Meds: Prozac 60mg  po qD for depression and anxiety   Labs: 03/13/2016 CMP WNL, chol eleavated, CBC WNL, HbA1c 5.8  Therapy: brief supportive therapy provided. Discussed psychosocial stressors in detail.     Consultations: -encouraged to follow up with therapist -encouraged to follow up with PCP  Pt denies SI and is at an acute low risk for suicide. Patient told to call clinic if any problems occur. Patient advised to go to ER if they should develop SI/HI, side effects, or if symptoms worsen. Has crisis numbers to call if needed. Pt verbalized understanding.  F/up in 3 months or sooner if needed   Charlcie Cradle, MD 07/26/2016, 9:30 AM

## 2016-07-30 ENCOUNTER — Ambulatory Visit (INDEPENDENT_AMBULATORY_CARE_PROVIDER_SITE_OTHER): Payer: Federal, State, Local not specified - PPO | Admitting: Psychology

## 2016-07-30 DIAGNOSIS — F330 Major depressive disorder, recurrent, mild: Secondary | ICD-10-CM | POA: Diagnosis not present

## 2016-07-30 NOTE — Progress Notes (Signed)
xxx

## 2016-07-30 NOTE — Progress Notes (Signed)
   THERAPIST PROGRESS NOTE  Session Time: 12.33pm-1.28pm  Participation Level: Active  Behavioral Response: Well GroomedAlertstressed  Type of Therapy: Individual Therapy  Treatment Goals addressed: Diagnosis: MDD and goal 1.  Interventions: CBT and Supportive  Summary: [name] is a 36 y.o. female who presents with affect congruent w/ report of stressors. Pt son joined session after discussion w/ husband of who's turn was to watch. 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 Pt discussed want for advocating for self and asserting way she would like resolved w/ union representative. Pt discussed stressor of mother in law and lack of self care/motivation/depression and effect having on family.  Pt discussed steps she has taking quit smoking for 1 month- difficult but support of husband.  Pt discussed goals for pt and husband for purchasing home and financial sets taking to reach.    Suicidal/Homicidal: Nowithout intent/plan  Therapist Response: Assessed pt current functioning per pt report. Processed w/pt stressors and things that pt can impact and focus on those.  Explored w/ pt wants for resolution and asserting her wants towards this.  Reflected positive steps taking and ways she is coping through stressors.   Plan: Return again in 2 weeks.  Diagnosis: MDD, mild    Brytni Dray, LPC 07/30/2016

## 2016-08-05 DIAGNOSIS — G4733 Obstructive sleep apnea (adult) (pediatric): Secondary | ICD-10-CM | POA: Diagnosis not present

## 2016-08-13 ENCOUNTER — Ambulatory Visit (HOSPITAL_COMMUNITY): Payer: Self-pay | Admitting: Psychology

## 2016-09-03 ENCOUNTER — Ambulatory Visit (INDEPENDENT_AMBULATORY_CARE_PROVIDER_SITE_OTHER): Payer: Federal, State, Local not specified - PPO | Admitting: Psychology

## 2016-09-03 DIAGNOSIS — F330 Major depressive disorder, recurrent, mild: Secondary | ICD-10-CM | POA: Diagnosis not present

## 2016-09-03 NOTE — Progress Notes (Signed)
   THERAPIST PROGRESS NOTE  Session Time: 12.35pm-1.30pm  Participation Level: Active  Behavioral Response: Well GroomedAlertaffect wnl  Type of Therapy: Individual Therapy  Treatment Goals addressed: Diagnosis: MDD and goal 1.  Interventions: CBT and Supportive  Summary: [name] is a 36 y.o. female who presents with report of feeling tired. Pt reports that she is still struggling w/ feeling tired, feeling lack of motivation and overwhelmed.  Pt reports financial stressors, trying to meet needs w/out resources and being the planner and initiator for family.  Pt acknowledged that she feels overwhelmed and effecting her motivation.  Pt agreed to initiate things- just activation for 34min- instead of completing "everything that needs to be done" and doing things she enjoys in her down time- instead of looking at her phone.  Pt was able to identify how she can talk w/ husband about this as well- when he asks what is wrong.  Pt also discussed communication and trust w/ husband- re: when he goes out for down time.    Suicidal/Homicidal: Nowithout intent/plan  Therapist Response: Assessed pt current functioning per pt report.  Processed w/pt fatigue and lack of motivation.  Discussed stressors and feeling overwhelmed- how to increase engagement and communicate w/ supports.  Plan: Return again in 2 weeks.  Diagnosis: MDD    [name], [facility] 09/03/2016

## 2016-09-05 DIAGNOSIS — G4733 Obstructive sleep apnea (adult) (pediatric): Secondary | ICD-10-CM | POA: Diagnosis not present

## 2016-09-07 DIAGNOSIS — H938X3 Other specified disorders of ear, bilateral: Secondary | ICD-10-CM | POA: Diagnosis not present

## 2016-09-07 DIAGNOSIS — L299 Pruritus, unspecified: Secondary | ICD-10-CM | POA: Diagnosis not present

## 2016-09-07 DIAGNOSIS — H93293 Other abnormal auditory perceptions, bilateral: Secondary | ICD-10-CM | POA: Diagnosis not present

## 2016-09-07 DIAGNOSIS — J343 Hypertrophy of nasal turbinates: Secondary | ICD-10-CM | POA: Diagnosis not present

## 2016-09-19 DIAGNOSIS — K080 Exfoliation of teeth due to systemic causes: Secondary | ICD-10-CM | POA: Diagnosis not present

## 2016-10-01 ENCOUNTER — Encounter (HOSPITAL_COMMUNITY): Payer: Self-pay | Admitting: Psychology

## 2016-10-01 ENCOUNTER — Ambulatory Visit (HOSPITAL_COMMUNITY): Payer: Self-pay | Admitting: Psychology

## 2016-10-01 NOTE — Progress Notes (Signed)
[name] is a 36 y.o. female patient who didn't show for appointment.  Letter sent.        [name], LPC

## 2016-10-06 DIAGNOSIS — G4733 Obstructive sleep apnea (adult) (pediatric): Secondary | ICD-10-CM | POA: Diagnosis not present

## 2016-10-09 ENCOUNTER — Other Ambulatory Visit: Payer: Self-pay | Admitting: Family

## 2016-10-15 ENCOUNTER — Ambulatory Visit (INDEPENDENT_AMBULATORY_CARE_PROVIDER_SITE_OTHER): Payer: Federal, State, Local not specified - PPO | Admitting: Psychology

## 2016-10-15 DIAGNOSIS — F411 Generalized anxiety disorder: Secondary | ICD-10-CM

## 2016-10-15 DIAGNOSIS — K080 Exfoliation of teeth due to systemic causes: Secondary | ICD-10-CM | POA: Diagnosis not present

## 2016-10-15 DIAGNOSIS — F330 Major depressive disorder, recurrent, mild: Secondary | ICD-10-CM | POA: Diagnosis not present

## 2016-10-15 NOTE — Progress Notes (Signed)
   THERAPIST PROGRESS NOTE  Session Time: 12.35pm-1.25pm  Participation Level: Active  Behavioral Response: Well GroomedAlertstressed  Type of Therapy: Individual Therapy  Treatment Goals addressed: Diagnosis: MDD, GAD and goal 1.  Interventions: CBT and Supportive  Summary: [name] is a 36 y.o. female who presents with affect wnl.  Pt reported that a lot of financial stressors continue and trying to follow plan for getting own house in a year. Pt does report getting things accomplished around the house- just slow w/ kids present. Pt reported that stressors of neighbors harassing.  Pt also reported sister informed that her brother in law is being tested for cancer.  Pt discussed although conflict between them- wish for nothing bad for him/family.  Pt discussed how trying to process how to respond.  Pt reported that her husband has decided that when they move her mother in law will not be moving w/ them as not healthy for their family and feels that depending on them instead of taking care of self.  Pt discussed how although positive about this change still some guilt.     Suicidal/Homicidal: Nowithout intent/plan  Therapist Response: Assessed pt current functioning per pt report. Processed w/pt coping w/ stressors and interactions w/ family that stressful.  Reflected positive w/ progress w/ goals.  Assisted pt in validating mixed feelings re: situations facing.  Plan: Return again in 2 weeks.  Diagnosis: GAD, MDD    Laurelin Elson, [place] 10/15/2016

## 2016-10-25 ENCOUNTER — Ambulatory Visit (HOSPITAL_COMMUNITY): Payer: Self-pay | Admitting: Psychiatry

## 2016-10-29 ENCOUNTER — Ambulatory Visit (HOSPITAL_COMMUNITY): Payer: Self-pay | Admitting: Psychology

## 2016-10-29 ENCOUNTER — Encounter (HOSPITAL_COMMUNITY): Payer: Self-pay | Admitting: Psychology

## 2016-10-29 ENCOUNTER — Encounter (HOSPITAL_COMMUNITY): Payer: Self-pay | Admitting: Emergency Medicine

## 2016-10-29 DIAGNOSIS — Z79899 Other long term (current) drug therapy: Secondary | ICD-10-CM | POA: Diagnosis not present

## 2016-10-29 DIAGNOSIS — R110 Nausea: Secondary | ICD-10-CM | POA: Insufficient documentation

## 2016-10-29 DIAGNOSIS — I10 Essential (primary) hypertension: Secondary | ICD-10-CM | POA: Diagnosis not present

## 2016-10-29 DIAGNOSIS — R42 Dizziness and giddiness: Secondary | ICD-10-CM | POA: Diagnosis not present

## 2016-10-29 DIAGNOSIS — R9431 Abnormal electrocardiogram [ECG] [EKG]: Secondary | ICD-10-CM | POA: Diagnosis not present

## 2016-10-29 DIAGNOSIS — Z87891 Personal history of nicotine dependence: Secondary | ICD-10-CM | POA: Diagnosis not present

## 2016-10-29 DIAGNOSIS — R51 Headache: Secondary | ICD-10-CM | POA: Diagnosis not present

## 2016-10-29 LAB — URINALYSIS, ROUTINE W REFLEX MICROSCOPIC
BILIRUBIN URINE: NEGATIVE
Glucose, UA: NEGATIVE mg/dL
Hgb urine dipstick: NEGATIVE
Ketones, ur: NEGATIVE mg/dL
Leukocytes, UA: NEGATIVE
NITRITE: NEGATIVE
PH: 5 (ref 5.0–8.0)
Protein, ur: NEGATIVE mg/dL
SPECIFIC GRAVITY, URINE: 1.024 (ref 1.005–1.030)

## 2016-10-29 LAB — CBC
HEMATOCRIT: 39.5 % (ref 36.0–46.0)
HEMOGLOBIN: 12.9 g/dL (ref 12.0–15.0)
MCH: 29.3 pg (ref 26.0–34.0)
MCHC: 32.7 g/dL (ref 30.0–36.0)
MCV: 89.8 fL (ref 78.0–100.0)
Platelets: 296 10*3/uL (ref 150–400)
RBC: 4.4 MIL/uL (ref 3.87–5.11)
RDW: 13.8 % (ref 11.5–15.5)
WBC: 10.1 10*3/uL (ref 4.0–10.5)

## 2016-10-29 LAB — BASIC METABOLIC PANEL
ANION GAP: 8 (ref 5–15)
BUN: 13 mg/dL (ref 6–20)
CO2: 28 mmol/L (ref 22–32)
Calcium: 9.5 mg/dL (ref 8.9–10.3)
Chloride: 105 mmol/L (ref 101–111)
Creatinine, Ser: 1.06 mg/dL — ABNORMAL HIGH (ref 0.44–1.00)
GFR calc Af Amer: >60 mL/min (ref >60)
GLUCOSE: 131 mg/dL — AB (ref 65–99)
POTASSIUM: 3.9 mmol/L (ref 3.5–5.1)
Sodium: 141 mmol/L (ref 135–145)

## 2016-10-29 NOTE — ED Triage Notes (Signed)
Patient reports waking up around midnight last night and feeling that the room was spinning and "I had drinken all night even though I don't drink." Pt reports feeling has been constant all day. C/o small tension headache. Reports nausea when dizziness is severe. All neuro intact.

## 2016-10-30 ENCOUNTER — Emergency Department (HOSPITAL_COMMUNITY)
Admission: EM | Admit: 2016-10-30 | Discharge: 2016-10-30 | Disposition: A | Discharge status: Home / Self Care | Payer: Federal, State, Local not specified - PPO | Attending: Emergency Medicine | Admitting: Emergency Medicine

## 2016-10-30 DIAGNOSIS — R42 Dizziness and giddiness: Secondary | ICD-10-CM

## 2016-10-30 MED ORDER — MECLIZINE HCL 25 MG PO TABS: 25.0000 mg | ORAL_TABLET | Freq: Three times a day (TID) | ORAL | 0 refills | Status: DC | PRN

## 2016-10-30 MED ORDER — SODIUM CHLORIDE 0.9 % IV BOLUS (SEPSIS)
1000.0000 mL | Freq: Once | INTRAVENOUS | Status: AC
  Administered 2016-10-30: 1000 mL via INTRAVENOUS

## 2016-10-30 MED ORDER — ONDANSETRON 8 MG PO TBDP: ORAL_TABLET | ORAL | 0 refills | Status: DC

## 2016-10-30 MED ORDER — MECLIZINE HCL 25 MG PO TABS
25.0000 mg | ORAL_TABLET | Freq: Once | ORAL | Status: AC
Start: 2016-10-30 — End: 2016-10-30
  Administered 2016-10-30: 25 mg via ORAL
  Filled 2016-10-30: qty 1

## 2016-10-30 NOTE — Discharge Instructions (Signed)
1. Medications: Antivert, usual home medications 2. Treatment: rest, drink plenty of fluids,  3. Follow Up: Please followup with your primary doctor and/or ENT in 3-5 days days for discussion of your diagnoses and further evaluation after today's visit; if you do not have a primary care doctor use the resource guide provided to find one; Please return to the ER for worsening symptoms, dizziness or becomes persistent, development of fevers or other concerns.

## 2016-10-30 NOTE — ED Provider Notes (Signed)
[facility] DEPT Provider Note   CSN: [id number] Arrival date & time: 10/29/16  1938     History   Chief Complaint Chief Complaint  Patient presents with  . Dizziness    HPI [name] is a 36 y.o. 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 Pt reports the dizziness resolves with rest and immediately returns with head movement, bending or standing change of position.  Pt reports the symptoms resolve very quickly.  She reports associated nausea without vomiting. She denies recent URI or other illness, travel, sick contacts. She has never had these symptoms before.  Pt reports she has a current "tension headache" that started 2 days ago and is located on the left side of the head.  She reports headache has improved over the last 24 hours.  Pt reports she often gets tension headaches and this headache is exactly the same.  She reports taking ibuprofen, flexeril and valium for her headaches. She reports this headache only required the ibuprofen. She also reports checking her BP earlier today and found it to be 171/110.  She denies a hx of HTN.   Denies tick bites.   The history is provided by the patient and medical records.    Past Medical History:  Diagnosis Date  . Anxiety   . Cluster headaches   . Depression   . Eczema   . GERD (gastroesophageal reflux disease)   . Gestational diabetes 2012, 2016  . Hyperlipidemia   . Hypertension    no meds currently  . Sciatica   . Sleep apnea   . Snoring    sleep study 02/2012 with min AHI events    Patient Active Problem List   Diagnosis Date Noted  . Nicotine dependence, cigarettes, uncomplicated 42/35/3614  . Fatigue 11/03/2015  . Atypical chest pain 11/03/2015  .  Foot pain, left 09/13/2015  . Allergic rhinitis 05/16/2015  . Finger pain, right 05/16/2015  . S/P C-section 12/18/2014  . Hyperlipidemia 12/16/2014  . Hx of herpes simplex infection 11/29/2014  . Major depressive disorder, recurrent, severe without psychotic features ([place]) 09/23/2014  . GAD (generalized anxiety disorder) 09/23/2014  . Social anxiety disorder 09/23/2014  . Routine general medical examination at a health care facility 01/07/2014  . OSA on CPAP 11/26/2011  . Morbid obesity ([place]) 05/17/2011  . Right knee pain 02/16/2011  . Depression 02/16/2011  . Hx gestational diabetes 02/16/2011  . Eczema 02/16/2011    Past Surgical History:  Procedure Laterality Date  . CESAREAN SECTION  09 & 12   x's 2  . CESAREAN SECTION WITH BILATERAL TUBAL LIGATION Bilateral 12/18/2014   Procedure: REPEAT CESAREAN SECTION WITH BILATERAL TUBAL LIGATION;  Surgeon: Crawford Givens, MD;  Location: [facility];  Service: Obstetrics;  Laterality: Bilateral;  . COLPOSCOPY    . DENTAL SURGERY    . Ganglion removal from (L) wrist  1998  . LAPAROSCOPIC UNILATERAL SALPINGECTOMY Left 01/26/2015   Procedure: LAPAROSCOPIC UNILATERAL SALPINGECTOMY;  Surgeon: Crawford Givens, MD;  Location: [facility];  Service: Gynecology;  Laterality: Left;  . WISDOM TOOTH EXTRACTION      OB History    Gravida Para Term Preterm AB Living   3 3 1     3    SAB TAB Ectopic Multiple Live Births  0 3       Home Medications    Prior to Admission medications   Medication Sig Start Date End Date Taking? Authorizing Provider  FLUoxetine (PROZAC) 20 MG tablet Take 3 tablets (60 mg total) by mouth every morning. 07/26/16  Yes Charlcie Cradle, MD  ibuprofen (ADVIL,MOTRIN) 600 MG tablet TAKE 1 TABLET BY MOUTH EVERY 6 HOURS AS NEEDED FOR HEADACHE, MILD OR MODERATE PAIN 10/09/16  Yes Golden Circle, FNP  cyclobenzaprine (FLEXERIL) 10 MG tablet Take 1 tablet (10 mg total) by mouth 3 (three) times daily as needed for muscle  spasms. Patient not taking: Reported on 10/30/2016 03/13/16   Golden Circle, FNP  diazepam (VALIUM) 5 MG tablet Take 1 tablet (5 mg total) by mouth every 6 (six) hours as needed for anxiety. Patient not taking: Reported on 10/30/2016 03/13/16   Golden Circle, FNP  meclizine (ANTIVERT) 25 MG tablet Take 1 tablet (25 mg total) by mouth 3 (three) times daily as needed for dizziness. 10/30/16   Darletta Noblett, Jarrett Soho, PA-C  ondansetron (ZOFRAN ODT) 8 MG disintegrating tablet 8mg  ODT q4 hours prn nausea 10/30/16   Fizza Scales, Jarrett Soho, PA-C    Family History Family History  Problem Relation Age of Onset  . Arthritis Mother   . Alcohol abuse Mother   . Mental illness Mother   . Cancer Mother 40       squamous - unknown primary  . Coronary artery disease Mother 65       MI with stent  . Anxiety disorder Mother   . Depression Mother   . Arthritis Father   . Alcohol abuse Father   . Hyperlipidemia Father   . Heart disease Father   . Kidney disease Father   . Stroke Father   . Mental illness Father   . Drug abuse Father   . Diabetes Maternal Grandmother   . Heart disease Maternal Grandmother   . Stroke Maternal Grandfather   . Hypertension Paternal Grandfather   . Hyperlipidemia Paternal Grandfather   . Arthritis Other   . Alcohol abuse Other   . Breast cancer Paternal Aunt   . Anxiety disorder Sister     Social History Social History  Substance Use Topics  . Smoking status: Former Smoker    Packs/day: 0.10    Years: 15.00    Types: Cigarettes    Quit date: 06/24/2016  . Smokeless tobacco: Never Used  . Alcohol use Yes     Comment: once a month or less     Allergies   Codeine and Doxycycline   Review of Systems Review of Systems  Constitutional: Negative for appetite change, diaphoresis, fatigue, fever and unexpected weight change.  HENT: Negative for mouth sores.   Eyes: Negative for visual disturbance.  Respiratory: Negative for cough, chest tightness, shortness  of breath and wheezing.   Cardiovascular: Negative for chest pain.  Gastrointestinal: Positive for nausea. Negative for abdominal pain, constipation, diarrhea and vomiting.  Endocrine: Negative for polydipsia, polyphagia and polyuria.  Genitourinary: Negative for dysuria, frequency, hematuria and urgency.  Musculoskeletal: Negative for back pain and neck stiffness.  Skin: Negative for rash.  Allergic/Immunologic: Negative for immunocompromised state.  Neurological: Positive for dizziness. Negative for syncope, light-headedness and headaches.  Hematological: Does not bruise/bleed easily.  Psychiatric/Behavioral: Negative for sleep disturbance. The patient is not nervous/anxious.      Physical Exam Updated Vital Signs BP 129/79 (BP Location: Left Arm)   Pulse 72   Temp 98.9 F (37.2 C) (Oral)  Resp 18   LMP 10/08/2016   SpO2 100%   Physical Exam  Constitutional: She is oriented to person, place, and time. She appears well-developed and well-nourished. No distress.  HENT:  Head: Normocephalic and atraumatic.  Mouth/Throat: Oropharynx is clear and moist.  Eyes: Pupils are equal, round, and reactive to light. Conjunctivae and EOM are normal. No scleral icterus.  No horizontal, vertical or rotational nystagmus  Neck: Normal range of motion. Neck supple.  Full active and passive ROM without pain No midline or paraspinal tenderness No nuchal rigidity or meningeal signs  Cardiovascular: Normal rate, regular rhythm and intact distal pulses.   Pulmonary/Chest: Effort normal and breath sounds normal. No respiratory distress. She has no wheezes. She has no rales.  Abdominal: Soft. Bowel sounds are normal. There is no tenderness. There is no rebound and no guarding.  Musculoskeletal: Normal range of motion.  Lymphadenopathy:    She has no cervical adenopathy.  Neurological: She is alert and oriented to person, place, and time. No cranial nerve deficit. She exhibits normal muscle tone.  Coordination normal.  Mental Status:  Alert, oriented, thought content appropriate. Speech fluent without evidence of aphasia. Able to follow 2 step commands without difficulty.  Cranial Nerves:  II:  Peripheral visual fields grossly normal, pupils equal, round, reactive to light III,IV, VI: ptosis not present, extra-ocular motions intact bilaterally  V,VII: smile symmetric, facial light touch sensation equal VIII: hearing grossly normal bilaterally  IX,X: midline uvula rise  XI: bilateral shoulder shrug equal and strong XII: midline tongue extension  Motor:  5/5 in upper and lower extremities bilaterally including strong and equal grip strength and dorsiflexion/plantar flexion Sensory: Pinprick and light touch normal in all extremities.  Cerebellar: normal finger-to-nose with bilateral upper extremities Gait: normal gait and balance CV: distal pulses palpable throughout   Skin: Skin is warm and dry. No rash noted. She is not diaphoretic.  Psychiatric: She has a normal mood and affect. Her behavior is normal. Judgment and thought content normal.  Nursing note and vitals reviewed.    ED Treatments / Results  Labs (all labs ordered are listed, but only abnormal results are displayed) Labs Reviewed  BASIC METABOLIC PANEL - Abnormal; Notable for the following:       Result Value   Glucose, Bld 131 (*)    Creatinine, Ser 1.06 (*)    All other components within normal limits  URINALYSIS, ROUTINE W REFLEX MICROSCOPIC - Abnormal; Notable for the following:    APPearance HAZY (*)    All other components within normal limits  CBC  CBG MONITORING, ED     Procedures Procedures (including critical care time)  Medications Ordered in ED Medications  sodium chloride 0.9 % bolus 1,000 mL (1,000 mLs Intravenous New Bag/Given 10/30/16 0226)  meclizine (ANTIVERT) tablet 25 mg (25 mg Oral Given 10/30/16 0230)     Initial Impression / Assessment and Plan / ED Course  I have reviewed the  triage vital signs and the nursing notes.  Pertinent labs & imaging results that were available during my care of the patient were reviewed by me and considered in my medical decision making (see chart for details).  Clinical Course as of Oct 31 410  Tue Oct 30, 2016  0400 Patient ambulates in the hallway without difficulty. No gait disturbance. She reports she feels significantly better.  [HM]    Clinical Course User Index [HM] Dawanna Grauberger, Jarrett Soho, [place]    Patient presents with dizziness exacerbated by movement. No persistent  vertiginous symptoms. Normal neurologic exam.  History and exam most consistent with BPPV. Less likely to be Mnire's disease, labyrinthitis, vestibular neuritis or CVA.  Patient given fluids and meclizine with significant improvement in symptoms. Discussed follow-up with ears and throat. Patient is to return to the emergency department Shields fevers, neck pain, vision changes, persistence of her vertigo, vomiting or other concerns. She states understanding and is in agreement with the plan.  Final Clinical Impressions(s) / ED Diagnoses   Final diagnoses:  Dizziness  Vertigo    New Prescriptions New Prescriptions   MECLIZINE (ANTIVERT) 25 MG TABLET    Take 1 tablet (25 mg total) by mouth 3 (three) times daily as needed for dizziness.   ONDANSETRON (ZOFRAN ODT) 8 MG DISINTEGRATING TABLET    8mg  ODT q4 hours prn nausea     Abigail Butts, PA-C 10/30/16 0413    Orpah Greek, MD 10/30/16 [phone]

## 2016-10-31 ENCOUNTER — Encounter (HOSPITAL_COMMUNITY): Payer: Self-pay | Admitting: Psychology

## 2016-10-31 NOTE — Progress Notes (Signed)
[name] is a 36 y.o. female patient who didn't show for her appointment. Letter sent.        [name], LPC

## 2016-11-05 ENCOUNTER — Ambulatory Visit: Payer: Self-pay | Admitting: Nurse Practitioner

## 2016-11-05 DIAGNOSIS — G4733 Obstructive sleep apnea (adult) (pediatric): Secondary | ICD-10-CM | POA: Diagnosis not present

## 2016-11-07 ENCOUNTER — Encounter: Payer: Self-pay | Admitting: Nurse Practitioner

## 2016-11-07 ENCOUNTER — Ambulatory Visit (INDEPENDENT_AMBULATORY_CARE_PROVIDER_SITE_OTHER): Payer: Federal, State, Local not specified - PPO | Admitting: Nurse Practitioner

## 2016-11-07 ENCOUNTER — Ambulatory Visit: Payer: Self-pay | Admitting: Nurse Practitioner

## 2016-11-07 VITALS — BP 132/86 | HR 87 | Temp 98.8°F | Ht 63.0 in | Wt 291.0 lb

## 2016-11-07 DIAGNOSIS — R42 Dizziness and giddiness: Secondary | ICD-10-CM

## 2016-11-07 DIAGNOSIS — J069 Acute upper respiratory infection, unspecified: Secondary | ICD-10-CM | POA: Diagnosis not present

## 2016-11-07 MED ORDER — GUAIFENESIN ER 600 MG PO TB12: 600.0000 mg | ORAL_TABLET | Freq: Two times a day (BID) | ORAL | 0 refills | Status: DC | PRN

## 2016-11-07 MED ORDER — OXYMETAZOLINE HCL 0.05 % NA SOLN: 1.0000 | Freq: Two times a day (BID) | NASAL | 0 refills | Status: DC

## 2016-11-07 MED ORDER — PROMETHAZINE-DM 6.25-15 MG/5ML PO SYRP: 5.0000 mL | ORAL_SOLUTION | Freq: Three times a day (TID) | ORAL | 0 refills | Status: DC | PRN

## 2016-11-07 MED ORDER — FLUTICASONE PROPIONATE 50 MCG/ACT NA SUSP: 2.0000 | Freq: Every day | NASAL | 0 refills | Status: DC

## 2016-11-07 NOTE — Patient Instructions (Signed)
URI Instructions: Flonase and Afrin use: apply 1spray of afrin in each nare, wait 52mins, then apply 2sprays of flonase in each nare. Use both nasal spray consecutively x 3days, then flonase only for at least 14days.  Encourage adequate oral hydration.  Use over-the-counter  "cold" medicines  such as "Tylenol cold" , "Advil cold",  "Mucinex" or" Mucinex D"  for cough and congestion.  Avoid decongestants if you have high blood pressure. Use" Delsym" or" Robitussin" cough syrup varietis for cough.  You can use plain "Tylenol" or "Advi"l for fever, chills and achyness.   "Common cold" symptoms are usually triggered by a virus.  The antibiotics are usually not necessary. On average, a" viral cold" illness would take 4-7 days to resolve. Please, make an appointment if you are not better or if you're worse.  Do vertigo exercise daily x 2weeks.  Vertigo Vertigo means that you feel like you are moving when you are not. Vertigo can also make you feel like things around you are moving when they are not. This feeling can come and go at any time. Vertigo often goes away on its own. Follow these instructions at home:  Avoid making fast movements.  Avoid driving.  Avoid using heavy machinery.  Avoid doing any task or activity that might cause danger to you or other people if you would have a vertigo attack while you are doing it.  Sit down right away if you feel dizzy or have trouble with your balance.  Take over-the-counter and prescription medicines only as told by your doctor.  Follow instructions from your doctor about which positions or movements you should avoid.  Drink enough fluid to keep your pee (urine) clear or pale yellow.  Keep all follow-up visits as told by your doctor. This is important. Contact a doctor if:  Medicine does not help your vertigo.  You have a fever.  Your problems get worse or you have new symptoms.  Your family or friends see changes in your  behavior.  You feel sick to your stomach (nauseous) or you throw up (vomit).  You have a "pins and needles" feeling or you are numb in part of your body. Get help right away if:  You have trouble moving or talking.  You are always dizzy.  You pass out (faint).  You get very bad headaches.  You feel weak or have trouble using your hands, arms, or legs.  You have changes in your hearing.  You have changes in your seeing (vision).  You get a stiff neck.  Bright light starts to bother you. This information is not intended to replace advice given to you by your health care provider. Make sure you discuss any questions you have with your health care provider. Document Released: 10/04/2007 Document Revised: 06/02/2015 Document Reviewed: 04/19/2014 Elsevier Interactive Patient Education  [employer].

## 2016-11-07 NOTE — Progress Notes (Signed)
Subjective:  Patient ID: [name], female    DOB: Sep 25, 1980  Age: 36 y.o. MRN: [id number]  CC: Hospitalization Follow-up (still dizzy,ENT referral?) and Nasal Congestion (chest congestion/coughing---goingon for 3 days. ?)   URI   This is a new problem. The current episode started in the past 7 days. The problem has been unchanged. The fever has been present for less than 1 day. Associated symptoms include congestion, coughing, rhinorrhea, sinus pain, sneezing and a sore throat. Pertinent negatives include no swollen glands or wheezing. She has tried nothing for the symptoms.   Dizziness: Some improvement with meclizine. Will like referral to ENT (Dr. Redmond Baseman).  Outpatient Medications Prior to Visit  Medication Sig Dispense Refill  . cyclobenzaprine (FLEXERIL) 10 MG tablet Take 1 tablet (10 mg total) by mouth 3 (three) times daily as needed for muscle spasms. 30 tablet 0  . diazepam (VALIUM) 5 MG tablet Take 1 tablet (5 mg total) by mouth every 6 (six) hours as needed for anxiety. 10 tablet 0  . FLUoxetine (PROZAC) 20 MG tablet Take 3 tablets (60 mg total) by mouth every morning. 270 tablet 0  . ibuprofen (ADVIL,MOTRIN) 600 MG tablet TAKE 1 TABLET BY MOUTH EVERY 6 HOURS AS NEEDED FOR HEADACHE, MILD OR MODERATE PAIN 90 tablet 0  . meclizine (ANTIVERT) 25 MG tablet Take 1 tablet (25 mg total) by mouth 3 (three) times daily as needed for dizziness. 30 tablet 0  . ondansetron (ZOFRAN ODT) 8 MG disintegrating tablet 8mg  ODT q4 hours prn nausea 4 tablet 0   No facility-administered medications prior to visit.     ROS See HPI  Objective:  BP 132/86   Pulse 87   Temp 98.8 F (37.1 C)   Ht 5\' 3"  (1.6 m)   Wt 291 lb (132 kg)   LMP 10/08/2016   SpO2 99%   BMI 51.55 kg/m   BP Readings from Last 3 Encounters:  11/07/16 132/86  10/30/16 130/80  05/14/16 124/80    Wt Readings from Last 3 Encounters:  11/07/16 291 lb (132 kg)  05/14/16 284 lb 6.4 oz (129 kg)  03/13/16 281 lb  (127.5 kg)    Physical Exam  Constitutional: She is oriented to person, place, and time. No distress.  HENT:  Right Ear: Ear canal normal. A middle ear effusion is present.  Left Ear: External ear and ear canal normal. A middle ear effusion is present.  Nose: Mucosal edema and rhinorrhea present. Right sinus exhibits maxillary sinus tenderness. Right sinus exhibits no frontal sinus tenderness. Left sinus exhibits maxillary sinus tenderness. Left sinus exhibits no frontal sinus tenderness.  Mouth/Throat: Uvula is midline. No trismus in the jaw. Posterior oropharyngeal erythema present.  Cardiovascular: Normal rate.   Musculoskeletal: Normal range of motion.  Neurological: She is alert and oriented to person, place, and time.  Vitals reviewed.   Lab Results  Component Value Date   WBC 10.1 10/29/2016   HGB 12.9 10/29/2016   HCT 39.5 10/29/2016   PLT 296 10/29/2016   GLUCOSE 131 (H) 10/29/2016   CHOL 234 (H) 03/13/2016   TRIG 104 03/13/2016   HDL 52 03/13/2016   LDLDIRECT 111.5 09/09/2012   LDLCALC 152 (H) 02/09/2015   ALT 10 03/13/2016   AST 9 03/13/2016   NA 141 10/29/2016   K 3.9 10/29/2016   CL 105 10/29/2016   CREATININE 1.06 (H) 10/29/2016   BUN 13 10/29/2016   CO2 28 10/29/2016   TSH 1.34 11/03/2015   HGBA1C  5.8 03/13/2016    No results found.  Assessment & Plan:   Pollyann was seen today for hospitalization follow-up and nasal congestion.  Diagnoses and all orders for this visit:  Acute URI -     fluticasone (FLONASE) 50 MCG/ACT nasal spray; Place 2 sprays into both nostrils daily. -     oxymetazoline (AFRIN NASAL SPRAY) 0.05 % nasal spray; Place 1 spray into both nostrils 2 (two) times daily. Use only for 3days, then stop -     promethazine-dextromethorphan (PROMETHAZINE-DM) 6.25-15 MG/5ML syrup; Take 5 mLs by mouth 3 (three) times daily as needed for cough. -     guaiFENesin (MUCINEX) 600 MG 12 hr tablet; Take 1 tablet (600 mg total) by mouth 2 (two) times  daily as needed for cough or to loosen phlegm.  Vertigo -     Ambulatory referral to ENT   I am having Ms. Donley start on fluticasone, oxymetazoline, promethazine-dextromethorphan, and guaiFENesin. I am also having her maintain her diazepam, cyclobenzaprine, FLUoxetine, ibuprofen, meclizine, and ondansetron.  Meds ordered this encounter  Medications  . fluticasone (FLONASE) 50 MCG/ACT nasal spray    Sig: Place 2 sprays into both nostrils daily.    Dispense:  16 g    Refill:  0    Order Specific Question:   Supervising Provider    Answer:   Cassandria Anger [[id number]]  . oxymetazoline (AFRIN NASAL SPRAY) 0.05 % nasal spray    Sig: Place 1 spray into both nostrils 2 (two) times daily. Use only for 3days, then stop    Dispense:  30 mL    Refill:  0    Order Specific Question:   Supervising Provider    Answer:   Cassandria Anger [[id number]]  . promethazine-dextromethorphan (PROMETHAZINE-DM) 6.25-15 MG/5ML syrup    Sig: Take 5 mLs by mouth 3 (three) times daily as needed for cough.    Dispense:  180 mL    Refill:  0    Order Specific Question:   Supervising Provider    Answer:   Cassandria Anger [[id number]]  . guaiFENesin (MUCINEX) 600 MG 12 hr tablet    Sig: Take 1 tablet (600 mg total) by mouth 2 (two) times daily as needed for cough or to loosen phlegm.    Dispense:  14 tablet    Refill:  0    Order Specific Question:   Supervising Provider    Answer:   Cassandria Anger [[id number]]    Follow-up: No Follow-up on file.  Wilfred Lacy, NP

## 2016-11-13 ENCOUNTER — Ambulatory Visit: Payer: Self-pay | Admitting: Adult Health

## 2016-11-22 ENCOUNTER — Ambulatory Visit (HOSPITAL_COMMUNITY): Payer: Self-pay | Admitting: Psychiatry

## 2016-11-26 ENCOUNTER — Ambulatory Visit (INDEPENDENT_AMBULATORY_CARE_PROVIDER_SITE_OTHER): Payer: Federal, State, Local not specified - PPO | Admitting: Psychology

## 2016-11-26 DIAGNOSIS — F329 Major depressive disorder, single episode, unspecified: Secondary | ICD-10-CM | POA: Diagnosis not present

## 2016-11-26 DIAGNOSIS — F411 Generalized anxiety disorder: Secondary | ICD-10-CM

## 2016-11-26 DIAGNOSIS — F330 Major depressive disorder, recurrent, mild: Secondary | ICD-10-CM

## 2016-11-26 NOTE — Progress Notes (Signed)
   THERAPIST PROGRESS NOTE  Session Time: 1.30pm-2.20pm  Participation Level: Active  Behavioral Response: Well GroomedAlertWnL  Type of Therapy: Individual Therapy  Treatment Goals addressed: Diagnosis: MDD and goal 1.  Interventions: CBT and Supportive  Summary: [name] is a 36 y.o. female who presents with affect wnl.  Pt reported being very tired, getting over a cold and being very busy today running errands.  Pt reported that she has been stressed w/ getting things done, business of being mom.  Pt reported that she has had reminders of mom in several different way recent- but pleasant.  Pt reported applied for promotion- competitive.  Pt has been enjoying reading.  Pt focusing on balance of self care and taking care of others..   Suicidal/Homicidal: Nowithout intent/plan  Therapist Response: Assessed pt current functioning per pt report.  Processed w/ pt coping w/ stressors and balancing caretaker, working and self care.  Reflected positives w/ reminders of mom and enjoying engaging in things.  Plan: Return again in 2 weeks.  Diagnosis: MDD    [name], [facility] 11/26/2016

## 2016-12-04 ENCOUNTER — Ambulatory Visit (INDEPENDENT_AMBULATORY_CARE_PROVIDER_SITE_OTHER): Payer: Federal, State, Local not specified - PPO | Admitting: Adult Health

## 2016-12-04 ENCOUNTER — Encounter: Payer: Self-pay | Admitting: Adult Health

## 2016-12-04 DIAGNOSIS — Z9989 Dependence on other enabling machines and devices: Secondary | ICD-10-CM

## 2016-12-04 DIAGNOSIS — G4733 Obstructive sleep apnea (adult) (pediatric): Secondary | ICD-10-CM

## 2016-12-04 NOTE — Patient Instructions (Addendum)
Continue on CPAP At bedtime  .  Adjust CPAP pressure to 7  To 12 cmH2O .  Try to wear each night for at least 4hr each night .  Work on healthy weight  Do not drive if sleepy  Follow up with Dr. Elsworth Soho  In 1 year and As needed

## 2016-12-04 NOTE — Assessment & Plan Note (Signed)
Well controlled on CPAP  Will adjust pressure for comfort .  Encouraged on daily usage.   Plan  Patient Instructions  Continue on CPAP At bedtime  .  Adjust CPAP pressure to 7  To 12 cmH2O .  Try to wear each night for at least 4hr each night .  Work on healthy weight  Do not drive if sleepy  Follow up with Dr. Elsworth Soho  In 1 year and As needed

## 2016-12-04 NOTE — Assessment & Plan Note (Signed)
Wt loss  

## 2016-12-04 NOTE — Progress Notes (Signed)
@Patient  ID: [name], female    DOB: [birth year], 36 y.o.   MRN: [id number]  Chief Complaint  Patient presents with  . Follow-up    OSA    Referring provider: Golden Circle, FNP  HPI: 36 yr old female followed for mild OSA  [employer].   Significant tests/ events reviewed  PSG  02/2012 - 263 pounds. 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  PSG 02/2016 AHI 12/h  12/04/2016 Follow up : OSA  Pt returns for 6 month follow up . She says she is doing well on CPAP . Tries to wear each night. Sometimes falls asleep and does not put on mask. She feels rested and much better when she wears it. Does feel pressure is too low at times.  Download shows good compliance with avg usage at 7hr . AHI 0.9 , min leaks.  She is on auto set 5 to 12cmH2O.  Discussed wt loss.    Allergies  Allergen Reactions  . Codeine     STOMACH CRAMPING  . Doxycycline Nausea And Vomiting    Immunization History  Administered Date(s) Administered  . Influenza Whole 10/30/2011  . Influenza,inj,Quad PF,6+ Mos 10/21/2012  . Influenza-Unspecified 11/02/2015  . Td 01/08/2005    Past Medical History:  Diagnosis Date  . Anxiety   . Cluster headaches   . Depression   . Eczema   . GERD (gastroesophageal reflux disease)   . Gestational diabetes 2012, 2016  . Hyperlipidemia   . Hypertension    no meds currently  . Sciatica   . Sleep apnea   . Snoring    sleep study 02/2012 with min AHI events    Tobacco History: Social History   Tobacco Use  Smoking Status Former Smoker  . Packs/day: 0.10  . Years: 15.00  . Pack years: 1.50  . Types: Cigarettes  . Last attempt to quit: 06/24/2016  . Years since quitting: 0.4  Smokeless Tobacco Never Used   Counseling given: Not Answered   Outpatient Encounter Medications as of 12/04/2016  Medication Sig  . cyclobenzaprine (FLEXERIL) 10 MG tablet  Take 1 tablet (10 mg total) by mouth 3 (three) times daily as needed for muscle spasms.  . diazepam (VALIUM) 5 MG tablet Take 1 tablet (5 mg total) by mouth every 6 (six) hours as needed for anxiety.  Marland Kitchen FLUoxetine (PROZAC) 20 MG tablet Take 3 tablets (60 mg total) by mouth every morning.  . fluticasone (FLONASE) 50 MCG/ACT nasal spray Place 2 sprays into both nostrils daily.  Marland Kitchen ibuprofen (ADVIL,MOTRIN) 600 MG tablet TAKE 1 TABLET BY MOUTH EVERY 6 HOURS AS NEEDED FOR HEADACHE, MILD OR MODERATE PAIN  . meclizine (ANTIVERT) 25 MG tablet Take 1 tablet (25 mg total) by mouth 3 (three) times daily as needed for dizziness.  . ondansetron (ZOFRAN ODT) 8 MG disintegrating tablet 8mg  ODT q4 hours prn nausea  . promethazine-dextromethorphan (PROMETHAZINE-DM) 6.25-15 MG/5ML syrup Take 5 mLs by mouth 3 (three) times daily as needed for cough.  Marland Kitchen guaiFENesin (MUCINEX) 600 MG 12 hr tablet Take 1 tablet (600 mg total) by mouth 2 (two) times daily as needed for cough or to loosen phlegm. (Patient not taking: Reported on 12/04/2016)  . oxymetazoline (AFRIN NASAL SPRAY) 0.05 % nasal spray Place 1 spray into both nostrils 2 (two) times daily. Use only for 3days, then stop (Patient not taking: Reported on 12/04/2016)  No facility-administered encounter medications on file as of 12/04/2016.      Review of Systems  Constitutional:   No  weight loss, night sweats,  Fevers, chills, fatigue, or  lassitude.  HEENT:   No headaches,  Difficulty swallowing,  Tooth/dental problems, or  Sore throat,                No sneezing, itching, ear ache, nasal congestion, post nasal drip,   CV:  No chest pain,  Orthopnea, PND, swelling in lower extremities, anasarca, dizziness, palpitations, syncope.   GI  No heartburn, indigestion, abdominal pain, nausea, vomiting, diarrhea, change in bowel habits, loss of appetite, bloody stools.   Resp: No shortness of breath with exertion or at rest.  No excess mucus, no productive cough,  No  non-productive cough,  No coughing up of blood.  No change in color of mucus.  No wheezing.  No chest wall deformity  Skin: no rash or lesions.  GU: no dysuria, change in color of urine, no urgency or frequency.  No flank pain, no hematuria   MS:  No joint pain or swelling.  No decreased range of motion.  No back pain.    Physical Exam  BP 124/84 (BP Location: Left Arm, Cuff Size: Large)   Pulse 82   Ht 5\' 3"  (1.6 m)   Wt 295 lb 9.6 oz (134.1 kg)   SpO2 98%   BMI 52.36 kg/m   GEN: A/Ox3; pleasant , NAD, obese    HEENT:  [place]/AT,  EACs-clear, TMs-wnl, NOSE-clear, THROAT-clear, no lesions, no postnasal drip or exudate noted. Class 2-3 MP airway   NECK:  Supple w/ fair ROM; no JVD; normal carotid impulses w/o bruits; no thyromegaly or nodules palpated; no lymphadenopathy.    RESP  Clear  P & A; w/o, wheezes/ rales/ or rhonchi. no accessory muscle use, no dullness to percussion  CARD:  RRR, no m/r/g, no peripheral edema, pulses intact, no cyanosis or clubbing.  GI:   Soft & nt; nml bowel sounds; no organomegaly or masses detected.   Musco: Warm bil, no deformities or joint swelling noted.   Neuro: alert, no focal deficits noted.    Skin: Warm, no lesions or rashes    Lab Results:  CBC  BNP No results found for: BNP  ProBNP No results found for: PROBNP  Imaging: No results found.   Assessment & Plan:   OSA on CPAP Well controlled on CPAP  Will adjust pressure for comfort .  Encouraged on daily usage.   Plan  Patient Instructions  Continue on CPAP At bedtime  .  Adjust CPAP pressure to 7  To 12 cmH2O .  Try to wear each night for at least 4hr each night .  Work on healthy weight  Do not drive if sleepy  Follow up with Dr. Elsworth Soho  In 1 year and As needed       Morbid obesity ([place]) Wt loss      Rexene Edison, NP 12/04/2016

## 2016-12-06 DIAGNOSIS — G4733 Obstructive sleep apnea (adult) (pediatric): Secondary | ICD-10-CM | POA: Diagnosis not present

## 2016-12-10 ENCOUNTER — Ambulatory Visit (HOSPITAL_COMMUNITY): Payer: Self-pay | Admitting: Psychology

## 2016-12-24 ENCOUNTER — Ambulatory Visit (INDEPENDENT_AMBULATORY_CARE_PROVIDER_SITE_OTHER): Payer: Federal, State, Local not specified - PPO | Admitting: Psychology

## 2016-12-24 DIAGNOSIS — F411 Generalized anxiety disorder: Secondary | ICD-10-CM

## 2016-12-24 DIAGNOSIS — F330 Major depressive disorder, recurrent, mild: Secondary | ICD-10-CM

## 2016-12-24 NOTE — Progress Notes (Signed)
   THERAPIST PROGRESS NOTE  Session Time: 12.30pm-1.30pm  Participation Level: Active  Behavioral Response: Well GroomedAlertaffect wnl  Type of Therapy: Individual Therapy  Treatment Goals addressed: Diagnosis: GAD, MDD and goal 1.  Interventions: CBT and Supportive  Summary: [name] is a 36 y.o. female who presents with affect wnl.  Pt brought daughter to session who was sick today and to communicate and express feelings to her.  Pt reported that stressors still balancing work, parenting, financial stressors and self care.  Pt reported that daughter had expressed to husband sad as feels that treated differently than younger siblings asked to do more, can't get away with things they do.  Pt expressed empathy and want for daughter to be happy.  Discussed different perspective and strives to be fair and other impacting factors.  Pt was able to reframe and express care and love and wants for daughter through effective communication.   Suicidal/Homicidal: Nowithout intent/plan  Therapist Response: Assessed pt current functioning per pt report.  Processed w/pt family interactions, stressors and focused on effective communication and positives of interactions/relationship.    Plan: Return again in 2 weeks.  Diagnosis: GAD   Keiton Cosma, [facility] 12/24/2016

## 2017-01-05 DIAGNOSIS — G4733 Obstructive sleep apnea (adult) (pediatric): Secondary | ICD-10-CM | POA: Diagnosis not present

## 2017-01-07 ENCOUNTER — Ambulatory Visit (HOSPITAL_COMMUNITY): Payer: Self-pay | Admitting: Psychology

## 2017-01-21 ENCOUNTER — Ambulatory Visit (INDEPENDENT_AMBULATORY_CARE_PROVIDER_SITE_OTHER): Payer: Federal, State, Local not specified - PPO | Admitting: Psychology

## 2017-01-21 DIAGNOSIS — F411 Generalized anxiety disorder: Secondary | ICD-10-CM

## 2017-01-21 DIAGNOSIS — F330 Major depressive disorder, recurrent, mild: Secondary | ICD-10-CM

## 2017-01-21 NOTE — Progress Notes (Signed)
   THERAPIST PROGRESS NOTE  Session Time: 12.40pm-1.27pm  Participation Level: Active  Behavioral Response: Well GroomedAlertstressed.  Type of Therapy: Individual Therapy  Treatment Goals addressed: Diagnosis: GAD, MDD and goal 1.  Interventions: CBT and Supportive  Summary: [name] is a 37 y.o. female who presents with affect congruent w/ report of stressed- worried.  Pt currently not working due to government shut down and so no income coming in.  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  Working w/ [employer], filed for unemployment and looking into [employer].  Couldn't go a loan for more than $100. Pt is taking day to day- focused on other activities to keep mind occuppied- lots of music and coloring..   Suicidal/Homicidal: Nowithout intent/plan  Therapist Response: Assessed pt current functioning per pt report. Processed w/pt coping w/ stressors of not working and no income.  Discussed resources to connect with and reflected taking appropriate steps and coping.  Plan: Return again in 2 weeks.  Diagnosis: MDD, GAD   [name], LPC 01/21/2017

## 2017-01-24 ENCOUNTER — Encounter (HOSPITAL_COMMUNITY): Payer: Self-pay | Admitting: Psychiatry

## 2017-01-24 ENCOUNTER — Ambulatory Visit (INDEPENDENT_AMBULATORY_CARE_PROVIDER_SITE_OTHER): Payer: Federal, State, Local not specified - PPO | Admitting: Psychiatry

## 2017-01-24 VITALS — BP 136/89 | HR 96 | Wt 299.8 lb

## 2017-01-24 DIAGNOSIS — F411 Generalized anxiety disorder: Secondary | ICD-10-CM | POA: Diagnosis not present

## 2017-01-24 DIAGNOSIS — F4010 Social phobia, unspecified: Secondary | ICD-10-CM

## 2017-01-24 DIAGNOSIS — Z811 Family history of alcohol abuse and dependence: Secondary | ICD-10-CM

## 2017-01-24 DIAGNOSIS — Z813 Family history of other psychoactive substance abuse and dependence: Secondary | ICD-10-CM | POA: Diagnosis not present

## 2017-01-24 DIAGNOSIS — Z818 Family history of other mental and behavioral disorders: Secondary | ICD-10-CM

## 2017-01-24 DIAGNOSIS — F331 Major depressive disorder, recurrent, moderate: Secondary | ICD-10-CM

## 2017-01-24 DIAGNOSIS — Z87891 Personal history of nicotine dependence: Secondary | ICD-10-CM | POA: Diagnosis not present

## 2017-01-24 MED ORDER — FLUOXETINE HCL 20 MG PO CAPS: 60.0000 mg | ORAL_CAPSULE | Freq: Every day | ORAL | 0 refills | Status: DC

## 2017-01-24 NOTE — Progress Notes (Signed)
[place] MD/PA/NP OP Progress Note  01/24/2017 11:07 AM [name]  MRN:  [id number]  Chief Complaint:  Chief Complaint    Depression; Follow-up     HPI: Pt works for [employer] and the last time she was paid was Dec 31st, 2018. Pt has been home since Dec 21st. She has applied for new jobs and is applying for unemployment. She is doing nothing at home. Pt is getting depressed, unmotivated and feeling stressed. Her husband is disabled so they have no income coming in. Sleep is good. Pt feels "blah" and is overwhelmed. Pt denies SI/HI.   Social anxiety is under control b/c she is not going anywhere.   Pt states-taking meds as prescribed and denies SE.   Visit Diagnosis:    ICD-10-CM   1. Moderate episode of recurrent major depressive disorder (HCC) F33.1 FLUoxetine (PROZAC) 20 MG capsule  2. GAD (generalized anxiety disorder) F41.1 FLUoxetine (PROZAC) 20 MG capsule  3. Social anxiety disorder F40.10 FLUoxetine (PROZAC) 20 MG capsule      Past Psychiatric History:  Hospitalizations- denies SIB/SA-denies but has has SI with plans in the past, denies SIB Meds: Paxil- effective but stopped during pregnancy, Celexa-ineffective, Zoloft-ineffective   Past Medical History:  Past Medical History:  Diagnosis Date  . Anxiety   . Cluster headaches   . Depression   . Eczema   . GERD (gastroesophageal reflux disease)   . Gestational diabetes 2012, 2016  . Hyperlipidemia   . Hypertension    no meds currently  . Sciatica   . Sleep apnea   . Snoring    sleep study 02/2012 with min AHI events    Past Surgical History:  Procedure Laterality Date  . CESAREAN SECTION  09 & 12   x's 2  . CESAREAN SECTION WITH BILATERAL TUBAL LIGATION Bilateral 12/18/2014   Procedure: REPEAT CESAREAN SECTION WITH BILATERAL TUBAL LIGATION;  Surgeon: Crawford Givens, MD;  Location: [facility];  Service: Obstetrics;  Laterality: Bilateral;  . COLPOSCOPY    . DENTAL SURGERY    . Ganglion removal from (L)  wrist  1998  . LAPAROSCOPIC UNILATERAL SALPINGECTOMY Left 01/26/2015   Procedure: LAPAROSCOPIC UNILATERAL SALPINGECTOMY;  Surgeon: Crawford Givens, MD;  Location: [facility];  Service: Gynecology;  Laterality: Left;  . WISDOM TOOTH EXTRACTION      Family Psychiatric History:  Family History  Problem Relation Age of Onset  . Arthritis Mother   . Alcohol abuse Mother   . Mental illness Mother   . Cancer Mother 42       squamous - unknown primary  . Coronary artery disease Mother 33       MI with stent  . Anxiety disorder Mother   . Depression Mother   . Arthritis Father   . Alcohol abuse Father   . Hyperlipidemia Father   . Heart disease Father   . Kidney disease Father   . Stroke Father   . Mental illness Father   . Drug abuse Father   . Diabetes Maternal Grandmother   . Heart disease Maternal Grandmother   . Stroke Maternal Grandfather   . Hypertension Paternal Grandfather   . Hyperlipidemia Paternal Grandfather   . Arthritis Other   . Alcohol abuse Other   . Breast cancer Paternal Aunt   . Anxiety disorder Sister     Social History:  Social History   Socioeconomic History  . Marital status: Married    Spouse name: Not on file  . Number of children:  3  . Years of education: 38  . Highest education level: Not on file  Social Needs  . Financial resource strain: Not on file  . Food insecurity - worry: Not on file  . Food insecurity - inability: Not on file  . Transportation needs - medical: Not on file  . Transportation needs - non-medical: Not on file  Occupational History  . 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 Smoking status: Former Smoker    Packs/day: 0.10    Years: 15.00    Pack years: 1.50    Types: Cigarettes    Last attempt to quit: 06/24/2016    Years since quitting: 0.5  . Smokeless tobacco: Never Used  Substance and Sexual Activity  . Alcohol use: Yes    Comment: once a month or less  . Drug use: No  . Sexual activity: Yes    Birth  control/protection: None  Other Topics Concern  . Not on file  Social History Narrative   Secretary   Married, lives with spouse and 2 kids   Moved to [facility] summer 2011 to be near mom - originally from [place]   Denies abuse and feels safe at home.    Allergies:  Allergies  Allergen Reactions  . Codeine     STOMACH CRAMPING  . Doxycycline Nausea And Vomiting    Metabolic Disorder Labs: Lab Results  Component Value Date   HGBA1C 5.8 03/13/2016   No results found for: PROLACTIN Lab Results  Component Value Date   CHOL 234 (H) 03/13/2016   TRIG 104 03/13/2016   HDL 52 03/13/2016   CHOLHDL 6 02/09/2015   VLDL 30.2 02/09/2015   LDLCALC 152 (H) 02/09/2015   LDLCALC 133 (H) 01/07/2014   Lab Results  Component Value Date   TSH 1.34 11/03/2015   TSH 1.37 02/09/2015    Therapeutic Level Labs: No results found for: LITHIUM No results found for: VALPROATE No components found for:  CBMZ  Current Medications: Current Outpatient Medications  Medication Sig Dispense Refill  . cyclobenzaprine (FLEXERIL) 10 MG tablet Take 1 tablet (10 mg total) by mouth 3 (three) times daily as needed for muscle spasms. 30 tablet 0  . diazepam (VALIUM) 5 MG tablet Take 1 tablet (5 mg total) by mouth every 6 (six) hours as needed for anxiety. 10 tablet 0  . FLUoxetine (PROZAC) 20 MG tablet Take 3 tablets (60 mg total) by mouth every morning. 270 tablet 0  . fluticasone (FLONASE) 50 MCG/ACT nasal spray Place 2 sprays into both nostrils daily. 16 g 0  . guaiFENesin (MUCINEX) 600 MG 12 hr tablet Take 1 tablet (600 mg total) by mouth 2 (two) times daily as needed for cough or to loosen phlegm. (Patient not taking: Reported on 12/04/2016) 14 tablet 0  . ibuprofen (ADVIL,MOTRIN) 600 MG tablet TAKE 1 TABLET BY MOUTH EVERY 6 HOURS AS NEEDED FOR HEADACHE, MILD OR MODERATE PAIN 90 tablet 0  . meclizine (ANTIVERT) 25 MG tablet Take 1 tablet (25 mg total) by mouth 3 (three) times daily as needed for dizziness. 30  tablet 0  . ondansetron (ZOFRAN ODT) 8 MG disintegrating tablet 8mg  ODT q4 hours prn nausea 4 tablet 0  . oxymetazoline (AFRIN NASAL SPRAY) 0.05 % nasal spray Place 1 spray into both nostrils 2 (two) times daily. Use only for 3days, then stop (Patient not taking: Reported on 12/04/2016) 30 mL 0  . promethazine-dextromethorphan (PROMETHAZINE-DM) 6.25-15 MG/5ML syrup Take 5 mLs by mouth 3 (three) times daily  as needed for cough. 180 mL 0   No current facility-administered medications for this visit.      Musculoskeletal: Strength & Muscle Tone: within normal limits Gait & Station: normal Patient leans: N/A  Psychiatric Specialty Exam: Review of Systems  Constitutional: Negative for chills and fever.  HENT: Negative for congestion, ear pain, sinus pain, sore throat and tinnitus.   Neurological: Negative for weakness.    Blood pressure 136/89, pulse 96, weight 299 lb 12.8 oz (136 kg), unknown if currently breastfeeding.Body mass index is 53.11 kg/m.  General Appearance: Casual  Eye Contact:  Good  Speech:  Clear and Coherent and Normal Rate  Volume:  Normal  Mood:  Anxious and Depressed  Affect:  Congruent  Thought Process:  Goal Directed and Descriptions of Associations: Intact  Orientation:  Full (Time, Place, and Person)  Thought Content: Logical   Suicidal Thoughts:  No  Homicidal Thoughts:  No  Memory:  Immediate;   Good Recent;   Good Remote;   Good  Judgement:  Good  Insight:  Good  Psychomotor Activity:  Normal  Concentration:  Concentration: Good and Attention Span: Good  Recall:  Good  Fund of Knowledge: Good  Language: Good  Akathisia:  No  Handed:  Right  AIMS (if indicated): not done  Assets:  Communication Skills Desire for Improvement Housing Leisure Time Social Support Transportation  ADL's:  Intact  Cognition: WNL  Sleep:  Fair   Screenings: PHQ2-9     Office Visit from 02/09/2015 in [facility] from [facility]13/[facility]  PHQ-2 Total Score  2  1  PHQ-9 Total Score  5  No data       Assessment and Plan:  MDD-recurrent, moderate- worsening; GAD- worsening; Social anxiety disorder Pt feels her depression and anxiety are appropriate due to current stressors and I agree.    Medication management with supportive therapy. Risks/benefits and SE of the medication discussed. Pt verbalized understanding and verbal consent obtained for treatment.  Affirm with the patient that the medications are taken as ordered. Patient expressed understanding of how their medications were to be used.    Meds: Prozac 60mg  po qD for MDD for GAD and SAD Pt doesn't want meds changed  Labs: none  Therapy: brief supportive therapy provided. Discussed psychosocial stressors in detail.     Consultations: Encouraged to follow up with therapist Encouraged to follow up with PCP as needed  Pt denies SI and is at an acute low risk for suicide. Patient told to call clinic if any problems occur. Patient advised to go to ER if they should develop SI/HI, side effects, or if symptoms worsen. Has crisis numbers to call if needed. Pt verbalized understanding.  F/up in 2 months or sooner if needed    Charlcie Cradle, MD 01/24/2017, 11:07 AM

## 2017-02-04 ENCOUNTER — Ambulatory Visit (HOSPITAL_COMMUNITY): Payer: Self-pay | Admitting: Psychology

## 2017-02-04 ENCOUNTER — Encounter (HOSPITAL_COMMUNITY): Payer: Self-pay | Admitting: Psychology

## 2017-02-04 NOTE — Progress Notes (Signed)
[name] is a 37 y.o. female patient who didn't show for appointment. Next appointment scheduled for 02/18/17.        [name], LPC

## 2017-02-05 DIAGNOSIS — G4733 Obstructive sleep apnea (adult) (pediatric): Secondary | ICD-10-CM | POA: Diagnosis not present

## 2017-02-18 ENCOUNTER — Ambulatory Visit (INDEPENDENT_AMBULATORY_CARE_PROVIDER_SITE_OTHER): Payer: Federal, State, Local not specified - PPO | Admitting: Psychology

## 2017-02-18 DIAGNOSIS — F330 Major depressive disorder, recurrent, mild: Secondary | ICD-10-CM

## 2017-02-18 DIAGNOSIS — F411 Generalized anxiety disorder: Secondary | ICD-10-CM

## 2017-02-18 NOTE — Progress Notes (Signed)
   THERAPIST PROGRESS NOTE  Session Time: 12.48pm-1.35pm  Participation Level: Active  Behavioral Response: Well GroomedAlertaffect wnl  Type of Therapy: Individual Therapy  Treatment Goals addressed: Diagnosis: MDD, GAD and goal 1.  Interventions: CBT  Summary: [name] is a 37 y.o. female who presents with affect wnl.  Pt arrived late.  Pt reported that she is back to work, received back pay and getting caught up w/ bills.  Pt reported that she is feeling tired- does ok after work until sits down- then no motivation to get back up.  Pt reports stressors of working full time, 3 kids and household responsibilities. Pt reports she is using self care w/ reading and coloring- but struggling to find engergy for other things.  Pt discussed stress of dad wanting to talk about relationship he is in.  Pt also discussed concern re: upcoming family trip to [place]- mother in law coming to spend time w/ her family while they are visiting others- but not planning and anticipating that they will be taking care of her.  Pt discussed how she is communicating expectation.   Suicidal/Homicidal: Nowithout intent/plan  Therapist Response: Assessed pt current functioning per pt report. Processed w/pt coping through stressors and using communication skills to discuss boundaries and expectations.  Plan: Return again in 2 weeks.  Diagnosis: MDD, GAD   [name], LPC 02/18/2017

## 2017-02-27 ENCOUNTER — Other Ambulatory Visit: Payer: Self-pay | Admitting: Family

## 2017-03-04 ENCOUNTER — Ambulatory Visit (HOSPITAL_COMMUNITY): Payer: Self-pay | Admitting: Psychology

## 2017-03-05 ENCOUNTER — Other Ambulatory Visit (HOSPITAL_COMMUNITY): Payer: Self-pay | Admitting: Psychiatry

## 2017-03-05 DIAGNOSIS — F4010 Social phobia, unspecified: Secondary | ICD-10-CM

## 2017-03-05 DIAGNOSIS — F411 Generalized anxiety disorder: Secondary | ICD-10-CM

## 2017-03-05 DIAGNOSIS — F330 Major depressive disorder, recurrent, mild: Secondary | ICD-10-CM

## 2017-03-07 DIAGNOSIS — G4733 Obstructive sleep apnea (adult) (pediatric): Secondary | ICD-10-CM | POA: Diagnosis not present

## 2017-03-14 ENCOUNTER — Encounter: Payer: Self-pay | Admitting: Nurse Practitioner

## 2017-03-14 ENCOUNTER — Encounter: Payer: Self-pay | Admitting: Family

## 2017-03-14 DIAGNOSIS — Z0289 Encounter for other administrative examinations: Secondary | ICD-10-CM

## 2017-03-18 ENCOUNTER — Ambulatory Visit (HOSPITAL_COMMUNITY): Payer: Self-pay | Admitting: Psychology

## 2017-04-01 ENCOUNTER — Ambulatory Visit (INDEPENDENT_AMBULATORY_CARE_PROVIDER_SITE_OTHER): Payer: Federal, State, Local not specified - PPO | Admitting: Psychology

## 2017-04-01 DIAGNOSIS — F330 Major depressive disorder, recurrent, mild: Secondary | ICD-10-CM

## 2017-04-01 DIAGNOSIS — F411 Generalized anxiety disorder: Secondary | ICD-10-CM

## 2017-04-01 NOTE — Progress Notes (Signed)
   THERAPIST PROGRESS NOTE  Session Time: 12.33pm-1.23pm  Participation Level: Active  Behavioral Response: Well GroomedAlertaffect wnl  Type of Therapy: Individual Therapy  Treatment Goals addressed: Diagnosis: GAD, MDD and goal 1.  Interventions: CBT and Supportive  Summary: [name] is a 37 y.o. female who presents with affect wnl.  Pt reported on self and family illnesses that had impacted. Pt reported she is sleeping well but still not feeling rested- feels may still be 'getting over' cold.  Pt reported good news that she received new job/promotion and will be starting in a month.  Pt is excited about this step and benefits.  Pt reported that brother is passing through town w/ family and is meeting up w/ him and family tonight. Pt is looking forward to this but also anxious about as still not talking to brother as did in past and w/ sister and brother in law- still feels tension w/ brother in law.  Pt reported that she is going to remain focused on enjoying and being present w/ this time w/ niece and nephews.     Suicidal/Homicidal: Nowithout intent/plan  Therapist Response: Assessed pt current functioning per pt report. Processed w/pt stressors of relationship w/ siblings how has changed from past closeness- mourning loss but being accepting of present in today's interactions.  Plan: Return again in 2 weeks.  Diagnosis: GAD, MDD   [name], [facility] 04/01/2017

## 2017-04-05 DIAGNOSIS — G4733 Obstructive sleep apnea (adult) (pediatric): Secondary | ICD-10-CM | POA: Diagnosis not present

## 2017-04-15 ENCOUNTER — Ambulatory Visit (INDEPENDENT_AMBULATORY_CARE_PROVIDER_SITE_OTHER): Payer: Federal, State, Local not specified - PPO | Admitting: Psychology

## 2017-04-15 DIAGNOSIS — F411 Generalized anxiety disorder: Secondary | ICD-10-CM

## 2017-04-15 DIAGNOSIS — F330 Major depressive disorder, recurrent, mild: Secondary | ICD-10-CM | POA: Diagnosis not present

## 2017-04-15 NOTE — Progress Notes (Signed)
   THERAPIST PROGRESS NOTE  Session Time: 12.30pm-1.27pm  Participation Level: Active  Behavioral Response: Well GroomedAlertaffect birght  Type of Therapy: Individual Therapy  Treatment Goals addressed: Diagnosis: GAD and MDd and goal 1.  Interventions: CBT and Supportive  Summary: IRINA OKELLY is a 37 y.o. female who presents with affect wnl.  Pt reported that she is excited about starting her new position at work.  Pt reports that her schedule will be changing and not sure how she will be able to flex schedule for appointments.  Pt reported she is trying to wrap things up w/ current job and prepare home for being away w/ upcoming training.  Pt aware that she can make things perfect before leaves- set priorities.  Pt discussed visit w/ her brother's family.  Pt discussed positives and some challenges w/ brother comments- but didn't hold on to- let go and focused on enjoying time. Pt discussed transitions for family w/ increased pay and excited about broadening opportunities.  .   Suicidal/Homicidal: Nowithout intent/plan  Therapist Response: Assessed pt current functioning per pt report. Processed w/pt interactions w/ family and how she was able to not ruminate on negatives.  Explored w/pt upcoming transitions w/ her job and positives.  Reflected pt continued progress.   Plan: Return again in 2 weeks.  Diagnosis: MDD, GAD   [name], LPC 04/15/2017

## 2017-04-25 ENCOUNTER — Ambulatory Visit (INDEPENDENT_AMBULATORY_CARE_PROVIDER_SITE_OTHER): Payer: Federal, State, Local not specified - PPO | Admitting: Psychiatry

## 2017-04-25 ENCOUNTER — Encounter (HOSPITAL_COMMUNITY): Payer: Self-pay | Admitting: Psychiatry

## 2017-04-25 DIAGNOSIS — Z818 Family history of other mental and behavioral disorders: Secondary | ICD-10-CM | POA: Diagnosis not present

## 2017-04-25 DIAGNOSIS — Z87891 Personal history of nicotine dependence: Secondary | ICD-10-CM

## 2017-04-25 DIAGNOSIS — F411 Generalized anxiety disorder: Secondary | ICD-10-CM

## 2017-04-25 DIAGNOSIS — Z813 Family history of other psychoactive substance abuse and dependence: Secondary | ICD-10-CM

## 2017-04-25 DIAGNOSIS — F4010 Social phobia, unspecified: Secondary | ICD-10-CM | POA: Diagnosis not present

## 2017-04-25 DIAGNOSIS — Z811 Family history of alcohol abuse and dependence: Secondary | ICD-10-CM | POA: Diagnosis not present

## 2017-04-25 DIAGNOSIS — F331 Major depressive disorder, recurrent, moderate: Secondary | ICD-10-CM

## 2017-04-25 MED ORDER — BUSPIRONE HCL 5 MG PO TABS: 5.0000 mg | ORAL_TABLET | Freq: Two times a day (BID) | ORAL | 0 refills | Status: DC

## 2017-04-25 MED ORDER — FLUOXETINE HCL 20 MG PO CAPS: 60.0000 mg | ORAL_CAPSULE | Freq: Every day | ORAL | 0 refills | Status: DC

## 2017-04-25 NOTE — Progress Notes (Signed)
BH MD/PA/NP OP Progress Note  04/25/2017 8:11 AM [name]  MRN:  [id number]  Chief Complaint:  Chief Complaint    Anxiety; Follow-up     HPI: Patient reports that she returned to work in February after the furlough ended.  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  Patient also has training in [place] for 3 weeks coming up.  She is happy to have the time alone.  She is a little anxious about supporting her husband during this time with the family.  Patient states her anxiety is much worse at bedtime.  During the day when she experiences anxiety she is able to distract herself but at night she finds it difficult to stop her racing thoughts.  As a result it is affecting her sleep and she is feeling tired during the day.  Her depression has significantly improved.  She denies crying spells and hopelessness.  She denies SI/HI.  Patient is taking the Prozac as prescribed and denies side effects  Visit Diagnosis:    ICD-10-CM   1. Moderate episode of recurrent major depressive disorder (HCC) F33.1 FLUoxetine (PROZAC) 20 MG capsule  2. GAD (generalized anxiety disorder) F41.1 FLUoxetine (PROZAC) 20 MG capsule    busPIRone (BUSPAR) 5 MG tablet  3. Social anxiety disorder F40.10 FLUoxetine (PROZAC) 20 MG capsule    busPIRone (BUSPAR) 5 MG tablet    Past Psychiatric History:  Hospitalizations- denies SIB/SA-denies but has has SI with plans in the past, denies SIB Meds: Paxil- effective but stopped during pregnancy, Celexa-ineffective, Zoloft-ineffective   Past Medical History:  Past Medical History:  Diagnosis Date  . Anxiety   . Cluster headaches   . Depression   . Eczema   . GERD (gastroesophageal reflux disease)   . Gestational diabetes 2012, 2016  . Hyperlipidemia   . Hypertension    no meds currently  . Sciatica   . Sleep apnea   . Snoring    sleep study 02/2012 with min AHI events    Past Surgical History:   Procedure Laterality Date  . CESAREAN SECTION  09 & 12   x's 2  . CESAREAN SECTION WITH BILATERAL TUBAL LIGATION Bilateral 12/18/2014   Procedure: REPEAT CESAREAN SECTION WITH BILATERAL TUBAL LIGATION;  Surgeon: Crawford Givens, MD;  Location: [facility];  Service: Obstetrics;  Laterality: Bilateral;  . COLPOSCOPY    . DENTAL SURGERY    . Ganglion removal from (L) wrist  1998  . LAPAROSCOPIC UNILATERAL SALPINGECTOMY Left 01/26/2015   Procedure: LAPAROSCOPIC UNILATERAL SALPINGECTOMY;  Surgeon: Crawford Givens, MD;  Location: [facility];  Service: Gynecology;  Laterality: Left;  . WISDOM TOOTH EXTRACTION      Family Psychiatric History:   Family History  Problem Relation Age of Onset  . Arthritis Mother   . Alcohol abuse Mother   . Mental illness Mother   . Cancer Mother 41       squamous - unknown primary  . Coronary artery disease Mother 29       MI with stent  . Anxiety disorder Mother   . Depression Mother   . Arthritis Father   . Alcohol abuse Father   . Hyperlipidemia Father   . Heart disease Father   . Kidney disease Father   . Stroke Father   . Mental illness Father   . Drug abuse Father   . Diabetes Maternal Grandmother   . Heart disease Maternal Grandmother   .  Stroke Maternal Grandfather   . Hypertension Paternal Grandfather   . Hyperlipidemia Paternal Grandfather   . Arthritis Other   . Alcohol abuse Other   . Breast cancer Paternal Aunt   . Anxiety disorder Sister     Social History:  Social History   Socioeconomic History  . Marital status: Married    Spouse name: Not on file  . Number of children: 3  . Years of education: 31  . Highest education level: Not on file  Occupational History  . Occupation: Group Transport planner  . Financial resource strain: Not on file  . Food insecurity:    Worry: Not on file    Inability: Not on file  . Transportation needs:    Medical: Not on file    Non-medical: Not on file  Tobacco Use  . Smoking status: Former  Smoker    Packs/day: 0.10    Years: 15.00    Pack years: 1.50    Types: Cigarettes    Last attempt to quit: 06/24/2016    Years since quitting: 0.8  . Smokeless tobacco: Never Used  Substance and Sexual Activity  . Alcohol use: Yes    Comment: once a month or less  . Drug use: No  . Sexual activity: Yes    Birth control/protection: None  Lifestyle  . Physical activity:    Days per week: Not on file    Minutes per session: Not on file  . Stress: Not on file  Relationships  . Social connections:    Talks on phone: Not on file    Gets together: Not on file    Attends religious service: Not on file    Active member of club or organization: Not on file    Attends meetings of clubs or organizations: Not on file    Relationship status: Not on file  Other Topics Concern  . Not on file  Social History Narrative   Secretary   Married, lives with spouse and 2 kids   Moved to [facility] summer 2011 to be near mom - originally from [place]   Denies abuse and feels safe at home.    Allergies:  Allergies  Allergen Reactions  . Codeine     STOMACH CRAMPING  . Doxycycline Nausea And Vomiting    Metabolic Disorder Labs: Lab Results  Component Value Date   HGBA1C 5.8 03/13/2016   No results found for: PROLACTIN Lab Results  Component Value Date   CHOL 234 (H) 03/13/2016   TRIG 104 03/13/2016   HDL 52 03/13/2016   CHOLHDL 6 02/09/2015   VLDL 30.2 02/09/2015   LDLCALC 152 (H) 02/09/2015   LDLCALC 133 (H) 01/07/2014   Lab Results  Component Value Date   TSH 1.34 11/03/2015   TSH 1.37 02/09/2015    Therapeutic Level Labs: No results found for: LITHIUM No results found for: VALPROATE No components found for:  CBMZ  Current Medications: Current Outpatient Medications  Medication Sig Dispense Refill  . FLUoxetine (PROZAC) 20 MG capsule Take 3 capsules (60 mg total) by mouth daily. 270 capsule 0  . busPIRone (BUSPAR) 5 MG tablet Take 1 tablet (5 mg total) by mouth 2 (two) times  daily. 180 tablet 0  . cyclobenzaprine (FLEXERIL) 10 MG tablet Take 1 tablet (10 mg total) by mouth 3 (three) times daily as needed for muscle spasms. (Patient not taking: Reported on 04/25/2017) 30 tablet 0  . diazepam (VALIUM) 5 MG tablet Take 1 tablet (5 mg  total) by mouth every 6 (six) hours as needed for anxiety. (Patient not taking: Reported on 04/25/2017) 10 tablet 0  . fluticasone (FLONASE) 50 MCG/ACT nasal spray Place 2 sprays into both nostrils daily. (Patient not taking: Reported on 04/25/2017) 16 g 0  . guaiFENesin (MUCINEX) 600 MG 12 hr tablet Take 1 tablet (600 mg total) by mouth 2 (two) times daily as needed for cough or to loosen phlegm. (Patient not taking: Reported on 12/04/2016) 14 tablet 0  . ibuprofen (ADVIL,MOTRIN) 600 MG tablet TAKE 1 TABLET BY MOUTH EVERY 6 HOURS AS NEEDED FOR HEADACHE, MILD OR MODERATE PAIN (Patient not taking: Reported on 04/25/2017) 90 tablet 0  . meclizine (ANTIVERT) 25 MG tablet Take 1 tablet (25 mg total) by mouth 3 (three) times daily as needed for dizziness. (Patient not taking: Reported on 04/25/2017) 30 tablet 0  . ondansetron (ZOFRAN ODT) 8 MG disintegrating tablet 8mg  ODT q4 hours prn nausea (Patient not taking: Reported on 01/24/2017) 4 tablet 0  . oxymetazoline (AFRIN NASAL SPRAY) 0.05 % nasal spray Place 1 spray into both nostrils 2 (two) times daily. Use only for 3days, then stop (Patient not taking: Reported on 04/25/2017) 30 mL 0  . promethazine-dextromethorphan (PROMETHAZINE-DM) 6.25-15 MG/5ML syrup Take 5 mLs by mouth 3 (three) times daily as needed for cough. (Patient not taking: Reported on 01/24/2017) 180 mL 0   No current facility-administered medications for this visit.      Musculoskeletal: Strength & Muscle Tone: within normal limits Gait & Station: normal Patient leans: N/A  Psychiatric Specialty Exam: Review of Systems  HENT: Positive for congestion. Negative for ear pain, sinus pain and sore throat.        Eyes and ears are  itching   Endo/Heme/Allergies: Positive for environmental allergies. Negative for polydipsia. Does not bruise/bleed easily.    unknown if currently breastfeeding.There is no height or weight on file to calculate BMI.  General Appearance: Casual  Eye Contact:  Good  Speech:  Clear and Coherent and Normal Rate  Volume:  Normal  Mood:  Euthymic  Affect:  Full Range  Thought Process:  Goal Directed and Descriptions of Associations: Intact  Orientation:  Full (Time, Place, and Person)  Thought Content: Logical   Suicidal Thoughts:  No  Homicidal Thoughts:  No  Memory:  Immediate;   Good Recent;   Good Remote;   Good  Judgement:  Good  Insight:  Good  Psychomotor Activity:  Normal  Concentration:  Concentration: Good and Attention Span: Good  Recall:  Good  Fund of Knowledge: Good  Language: Good  Akathisia:  No  Handed:  Right  AIMS (if indicated): not done  Assets:  Communication Skills Desire for Improvement Financial Resources/Insurance Housing Intimacy Leisure Time Resilience Social Support Talents/Skills Transportation Vocational/Educational  ADL's:  Intact  Cognition: WNL  Sleep:  Poor   Screenings: PHQ2-9     Office Visit from 02/09/2015 in [place] [facility]  PHQ-2 Total Score  2  1  PHQ-9 Total Score  5  -       Assessment and Plan: MDD-recurrent, moderate; GAD; social anxiety disorder    Medication management with supportive therapy. Risks and benefits, side effects and alternative treatment options discussed with patient. Pt was given an opportunity to ask questions about medication, illness, and treatment. All current psychiatric medications have been reviewed and discussed with the patient and adjusted as clinically appropriate. The patient has been provided  an accurate and updated list of the medications being now prescribed. Patient expressed understanding of how  their medications were to be used.  Pt verbalized understanding and verbal consent obtained for treatment.  Status of current problems: GAD is worse but MDD is stable  Meds: Start trial of Buspar 5mg  po BID for GAD  Prozac 60mg  p.o. daily for MDD, GAD, SAD Patient feels very hesitant to change or add additional meds due to fear of side effects and dependency.  We spent several minutes discussing the pros and cons of medication and its impact on overall quality of life.  Patient was agreeable to starting a trial of BuSpar  Labs: none  Therapy: brief supportive therapy provided. Discussed psychosocial stressors in detail.   Encouraged pt to develop daily routine and work on daily goal setting as a way to improve mood symptoms.  Reviewed sleep hygiene in detail Recommended pt stop all drug and alcohol use  Consultations: Encouraged to follow up with therapist Encouraged to follow up with PCP as needed  Pt denies SI and is at an acute low risk for suicide. Patient told to call clinic if any problems occur. Patient advised to go to ER if they should develop SI/HI, side effects, or if symptoms worsen. Has crisis numbers to call if needed. Pt verbalized understanding.  F/up in 2 months or sooner if needed    Charlcie Cradle, MD 04/25/2017, 8:11 AM

## 2017-05-06 DIAGNOSIS — G4733 Obstructive sleep apnea (adult) (pediatric): Secondary | ICD-10-CM | POA: Diagnosis not present

## 2017-05-13 ENCOUNTER — Ambulatory Visit (HOSPITAL_COMMUNITY): Payer: Self-pay | Admitting: Psychology

## 2017-06-05 DIAGNOSIS — G4733 Obstructive sleep apnea (adult) (pediatric): Secondary | ICD-10-CM | POA: Diagnosis not present

## 2017-06-07 ENCOUNTER — Encounter: Payer: Self-pay | Admitting: Nurse Practitioner

## 2017-07-18 ENCOUNTER — Encounter (HOSPITAL_COMMUNITY): Payer: Self-pay | Admitting: Psychiatry

## 2017-07-18 ENCOUNTER — Encounter

## 2017-07-18 ENCOUNTER — Ambulatory Visit (INDEPENDENT_AMBULATORY_CARE_PROVIDER_SITE_OTHER): Payer: Federal, State, Local not specified - PPO | Admitting: Psychiatry

## 2017-07-18 VITALS — BP 126/78 | HR 83 | Ht 62.0 in | Wt 292.0 lb

## 2017-07-18 DIAGNOSIS — F411 Generalized anxiety disorder: Secondary | ICD-10-CM | POA: Diagnosis not present

## 2017-07-18 DIAGNOSIS — Z811 Family history of alcohol abuse and dependence: Secondary | ICD-10-CM

## 2017-07-18 DIAGNOSIS — Z813 Family history of other psychoactive substance abuse and dependence: Secondary | ICD-10-CM | POA: Diagnosis not present

## 2017-07-18 DIAGNOSIS — F331 Major depressive disorder, recurrent, moderate: Secondary | ICD-10-CM | POA: Diagnosis not present

## 2017-07-18 DIAGNOSIS — Z818 Family history of other mental and behavioral disorders: Secondary | ICD-10-CM | POA: Diagnosis not present

## 2017-07-18 DIAGNOSIS — F4010 Social phobia, unspecified: Secondary | ICD-10-CM | POA: Diagnosis not present

## 2017-07-18 DIAGNOSIS — Z87891 Personal history of nicotine dependence: Secondary | ICD-10-CM

## 2017-07-18 MED ORDER — FLUOXETINE HCL 20 MG PO CAPS: 60.0000 mg | ORAL_CAPSULE | Freq: Every day | ORAL | 0 refills | Status: DC

## 2017-07-18 MED ORDER — BUSPIRONE HCL 5 MG PO TABS: 5.0000 mg | ORAL_TABLET | Freq: Two times a day (BID) | ORAL | 0 refills | Status: DC

## 2017-07-18 NOTE — Progress Notes (Signed)
BH MD/PA/NP OP Progress Note  07/18/2017 9:45 AM [name]  MRN:  [id number]  Chief Complaint:  Chief Complaint    Follow-up     HPI: "Good". 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 It was a lateral move but she loves the job. Pt states she is taking Buspar in the AM and notes it is has helped to keep her calm and she is worrying less. Pt is feeling more like herself now. She often forgets the nighttime dose of Buspar.  Pt is sleeping good and notes on 2 occassions she experienced AH of the door bell ringing right when waking up.  Pt notes she is stressed about finances and her husband's health. Her husband is having cardiac workup and she is worried about him.  "I feel good, really good" and denies depression. She denies anhedonia, isolation and hopelessness. Pt denies SI/HI.  Pt states-taking meds as prescribed and denies SE.    Visit Diagnosis:    ICD-10-CM   1. GAD (generalized anxiety disorder) F41.1 busPIRone (BUSPAR) 5 MG tablet    FLUoxetine (PROZAC) 20 MG capsule  2. Social anxiety disorder F40.10 busPIRone (BUSPAR) 5 MG tablet    FLUoxetine (PROZAC) 20 MG capsule  3. Moderate episode of recurrent major depressive disorder (HCC) F33.1 FLUoxetine (PROZAC) 20 MG capsule       Past Psychiatric History:  Hospitalizations- denies SIB/SA-denies but has has SI with plans in the past, denies SIB Meds: Paxil- effective but stopped during pregnancy, Celexa-ineffective, Zoloft-ineffective   Past Medical History:  Past Medical History:  Diagnosis Date  . Anxiety   . Cluster headaches   . Depression   . Eczema   . GERD (gastroesophageal reflux disease)   . Gestational diabetes 2012, 2016  . Hyperlipidemia   . Hypertension    no meds currently  . Sciatica   . Sleep apnea   . Snoring    sleep study 02/2012 with min AHI events    Past Surgical History:  Procedure Laterality Date  . CESAREAN SECTION  09 & 12   x's 2  . CESAREAN SECTION WITH BILATERAL  TUBAL LIGATION Bilateral 12/18/2014   Procedure: REPEAT CESAREAN SECTION WITH BILATERAL TUBAL LIGATION;  Surgeon: Crawford Givens, MD;  Location: [facility];  Service: Obstetrics;  Laterality: Bilateral;  . COLPOSCOPY    . DENTAL SURGERY    . Ganglion removal from (L) wrist  1998  . LAPAROSCOPIC UNILATERAL SALPINGECTOMY Left 01/26/2015   Procedure: LAPAROSCOPIC UNILATERAL SALPINGECTOMY;  Surgeon: Crawford Givens, MD;  Location: [facility];  Service: Gynecology;  Laterality: Left;  . WISDOM TOOTH EXTRACTION      Family Psychiatric and Medical History:  Family History  Problem Relation Age of Onset  . Arthritis Mother   . Alcohol abuse Mother   . Mental illness Mother   . Cancer Mother 45       squamous - unknown primary  . Coronary artery disease Mother 67       MI with stent  . Anxiety disorder Mother   . Depression Mother   . Arthritis Father   . Alcohol abuse Father   . Hyperlipidemia Father   . Heart disease Father   . Kidney disease Father   . Stroke Father   . Mental illness Father   . Drug abuse Father   . Diabetes Maternal Grandmother   . Heart disease Maternal Grandmother   . Stroke Maternal Grandfather   . Hypertension Paternal Grandfather   . Hyperlipidemia Paternal Grandfather   .  Arthritis Other   . Alcohol abuse Other   . Breast cancer Paternal Aunt   . Anxiety disorder Sister     Social History:  Social History   Socioeconomic History  . Marital status: Married    Spouse name: Not on file  . Number of children: 3  . Years of education: 22  . Highest education level: Not on file  Occupational History  . Occupation: Group Transport planner  . Financial resource strain: Not on file  . Food insecurity:    Worry: Not on file    Inability: Not on file  . Transportation needs:    Medical: Not on file    Non-medical: Not on file  Tobacco Use  . Smoking status: Current Every Day Smoker    Packs/day: 0.30    Years: 15.00    Pack years: 4.50    Types:  Cigarettes    Last attempt to quit: 06/24/2016    Years since quitting: 1.0  . Smokeless tobacco: Never Used  . Tobacco comment: Reports started back and trying ot quit again  Substance and Sexual Activity  . Alcohol use: Yes    Comment: once a month or less  . Drug use: No  . Sexual activity: Yes    Partners: Male    Birth control/protection: None  Lifestyle  . Physical activity:    Days per week: Not on file    Minutes per session: Not on file  . Stress: Not on file  Relationships  . Social connections:    Talks on phone: Not on file    Gets together: Not on file    Attends religious service: Not on file    Active member of club or organization: Not on file    Attends meetings of clubs or organizations: Not on file    Relationship status: Not on file  Other Topics Concern  . 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    Allergies:  Allergies  Allergen Reactions  . Codeine     STOMACH CRAMPING  . Doxycycline Nausea And Vomiting    Metabolic Disorder Labs: Lab Results  Component Value Date   HGBA1C 5.8 03/13/2016   No results found for: PROLACTIN Lab Results  Component Value Date   CHOL 234 (H) 03/13/2016   TRIG 104 03/13/2016   HDL 52 03/13/2016   CHOLHDL 6 02/09/2015   VLDL 30.2 02/09/2015   LDLCALC 152 (H) 02/09/2015   LDLCALC 133 (H) 01/07/2014   Lab Results  Component Value Date   TSH 1.34 11/03/2015   TSH 1.37 02/09/2015    Therapeutic Level Labs: No results found for: LITHIUM No results found for: VALPROATE No components found for:  CBMZ  Current Medications: Current Outpatient Medications  Medication Sig Dispense Refill  . busPIRone (BUSPAR) 5 MG tablet Take 1 tablet (5 mg total) by mouth 2 (two) times daily. 180 tablet 0  . diazepam (VALIUM) 5 MG tablet Take 1 tablet (5 mg total) by mouth  every 6 (six) hours as needed for anxiety. 10 tablet 0  . FLUoxetine (PROZAC) 20 MG capsule Take 3 capsules (60 mg total) by mouth daily. 270 capsule 0  . ibuprofen (ADVIL,MOTRIN) 600 MG tablet TAKE 1 TABLET BY MOUTH EVERY 6 HOURS AS NEEDED FOR  HEADACHE, MILD OR MODERATE PAIN 90 tablet 0  . cyclobenzaprine (FLEXERIL) 10 MG tablet Take 1 tablet (10 mg total) by mouth 3 (three) times daily as needed for muscle spasms. (Patient not taking: Reported on 04/25/2017) 30 tablet 0  . fluticasone (FLONASE) 50 MCG/ACT nasal spray Place 2 sprays into both nostrils daily. (Patient not taking: Reported on 04/25/2017) 16 g 0  . guaiFENesin (MUCINEX) 600 MG 12 hr tablet Take 1 tablet (600 mg total) by mouth 2 (two) times daily as needed for cough or to loosen phlegm. (Patient not taking: Reported on 12/04/2016) 14 tablet 0  . meclizine (ANTIVERT) 25 MG tablet Take 1 tablet (25 mg total) by mouth 3 (three) times daily as needed for dizziness. (Patient not taking: Reported on 04/25/2017) 30 tablet 0  . ondansetron (ZOFRAN ODT) 8 MG disintegrating tablet 8mg  ODT q4 hours prn nausea (Patient not taking: Reported on 01/24/2017) 4 tablet 0   No current facility-administered medications for this visit.      Musculoskeletal: Strength & Muscle Tone: within normal limits Gait & Station: normal Patient leans: N/A  Psychiatric Specialty Exam: Review of Systems  Constitutional: Negative for chills, diaphoresis and fever.  Psychiatric/Behavioral: Negative for hallucinations. The patient is not nervous/anxious and does not have insomnia.     Blood pressure 126/78, pulse 83, height 5\' 2"  (1.575 m), weight 292 lb (132.5 kg), SpO2 98 %, unknown if currently breastfeeding.Body mass index is 53.41 kg/m.  General Appearance: Fairly Groomed  Eye Contact:  Good  Speech:  Clear and Coherent and Normal Rate  Volume:  Normal  Mood:  Euthymic  Affect:  Full Range  Thought Process:  Goal Directed and Descriptions of Associations:  Intact  Orientation:  Full (Time, Place, and Person)  Thought Content: Logical   Suicidal Thoughts:  No  Homicidal Thoughts:  No  Memory:  Immediate;   Good Recent;   Good Remote;   Good  Judgement:  Good  Insight:  Good  Psychomotor Activity:  Normal  Concentration:  Concentration: Good and Attention Span: Good  Recall:  Good  Fund of Knowledge: Good  Language: Good  Akathisia:  No  Handed:  Right  AIMS (if indicated): not done  Assets:  Communication Skills Desire for Improvement Financial Resources/Insurance Housing Leisure Time Resilience Social Support Transportation Vocational/Educational  ADL's:  Intact  Cognition: WNL  Sleep:  Good   Screenings: PHQ2-9     Office Visit from 02/09/2015 in [place] from [facility]13/[facility]  PHQ-2 Total Score  2  1  PHQ-9 Total Score  5  -      I reviewed the information below on 07/18/2017 and agree except where noted/changed Assessment and Plan: MDD-recurrent, moderate; GAD; social anxiety disorder    Medication management with supportive therapy. Risks and benefits, side effects and alternative treatment options discussed with patient. Pt was given an opportunity to ask questions about medication, illness, and treatment. All current psychiatric medications have been reviewed and discussed with the patient and adjusted as clinically appropriate. The patient has been provided an accurate and updated list of the medications being now prescribed. Patient expressed understanding of how their medications were to be used.  Pt verbalized understanding and verbal consent obtained for treatment.  Status of current problems: GAD is better but MDD is stable  Meds: Buspar 5mg  po BID for GAD  Prozac 60mg  p.o. daily for MDD, GAD, SAD   Labs: none  Therapy: brief  supportive therapy provided. Discussed psychosocial stressors in detail.     Consultations:  Encouraged to follow up with therapist Encouraged to follow up with PCP as needed  Pt denies SI and is at an acute low risk for suicide. Patient told to call clinic if any problems occur. Patient advised to go to ER if they should develop SI/HI, side effects, or if symptoms worsen. Has crisis numbers to call if needed. Pt verbalized understanding.  F/up in 3 months or sooner if needed      Charlcie Cradle, MD 07/18/2017, 9:45 AM

## 2017-07-25 ENCOUNTER — Encounter: Payer: Self-pay | Admitting: Nurse Practitioner

## 2017-08-06 DIAGNOSIS — K080 Exfoliation of teeth due to systemic causes: Secondary | ICD-10-CM | POA: Diagnosis not present

## 2017-08-07 DIAGNOSIS — G4733 Obstructive sleep apnea (adult) (pediatric): Secondary | ICD-10-CM | POA: Diagnosis not present

## 2017-08-14 ENCOUNTER — Encounter: Payer: Self-pay | Admitting: Nurse Practitioner

## 2017-08-14 ENCOUNTER — Other Ambulatory Visit (INDEPENDENT_AMBULATORY_CARE_PROVIDER_SITE_OTHER): Payer: Federal, State, Local not specified - PPO

## 2017-08-14 ENCOUNTER — Ambulatory Visit (INDEPENDENT_AMBULATORY_CARE_PROVIDER_SITE_OTHER): Payer: Federal, State, Local not specified - PPO | Admitting: Nurse Practitioner

## 2017-08-14 VITALS — BP 114/78 | HR 76 | Temp 99.0°F | Resp 16 | Ht 62.0 in | Wt 292.0 lb

## 2017-08-14 DIAGNOSIS — Z0001 Encounter for general adult medical examination with abnormal findings: Secondary | ICD-10-CM

## 2017-08-14 DIAGNOSIS — M25561 Pain in right knee: Secondary | ICD-10-CM | POA: Diagnosis not present

## 2017-08-14 DIAGNOSIS — M542 Cervicalgia: Secondary | ICD-10-CM | POA: Insufficient documentation

## 2017-08-14 DIAGNOSIS — E782 Mixed hyperlipidemia: Secondary | ICD-10-CM

## 2017-08-14 DIAGNOSIS — Z716 Tobacco abuse counseling: Secondary | ICD-10-CM

## 2017-08-14 DIAGNOSIS — Z114 Encounter for screening for human immunodeficiency virus [HIV]: Secondary | ICD-10-CM

## 2017-08-14 DIAGNOSIS — R7303 Prediabetes: Secondary | ICD-10-CM

## 2017-08-14 DIAGNOSIS — G4733 Obstructive sleep apnea (adult) (pediatric): Secondary | ICD-10-CM | POA: Diagnosis not present

## 2017-08-14 DIAGNOSIS — G8929 Other chronic pain: Secondary | ICD-10-CM

## 2017-08-14 LAB — HEMOGLOBIN A1C: Hgb A1c MFr Bld: 5.9 % (ref 4.6–6.5)

## 2017-08-14 LAB — COMPREHENSIVE METABOLIC PANEL
ALBUMIN: 4.1 g/dL (ref 3.5–5.2)
ALT: 16 U/L (ref 0–35)
AST: 12 U/L (ref 0–37)
Alkaline Phosphatase: 84 U/L (ref 39–117)
BUN: 10 mg/dL (ref 6–23)
CO2: 28 mEq/L (ref 19–32)
Calcium: 9.6 mg/dL (ref 8.4–10.5)
Chloride: 104 mEq/L (ref 96–112)
Creatinine, Ser: 0.92 mg/dL (ref 0.40–1.20)
GFR: 72.65 mL/min (ref >60.00)
GLUCOSE: 105 mg/dL — AB (ref 70–99)
POTASSIUM: 4.1 meq/L (ref 3.5–5.1)
SODIUM: 138 meq/L (ref 135–145)
Total Bilirubin: 0.4 mg/dL (ref 0.2–1.2)
Total Protein: 7.1 g/dL (ref 6.0–8.3)

## 2017-08-14 LAB — LIPID PANEL
Cholesterol: 192 mg/dL (ref 0–200)
HDL: 33.9 mg/dL — ABNORMAL LOW (ref >39.00)
LDL Cholesterol: 128 mg/dL — ABNORMAL HIGH (ref 0–99)
NonHDL: 158.31
Total CHOL/HDL Ratio: 6
Triglycerides: 150 mg/dL — ABNORMAL HIGH (ref 0.0–149.0)
VLDL: 30 mg/dL (ref 0.0–40.0)

## 2017-08-14 LAB — CBC
HCT: 38.1 % (ref 36.0–46.0)
Hemoglobin: 12.9 g/dL (ref 12.0–15.0)
MCHC: 33.8 g/dL (ref 30.0–36.0)
MCV: 86.2 fl (ref 78.0–100.0)
PLATELETS: 278 10*3/uL (ref 150.0–400.0)
RBC: 4.42 Mil/uL (ref 3.87–5.11)
RDW: 14.1 % (ref 11.5–15.5)
WBC: 6.6 10*3/uL (ref 4.0–10.5)

## 2017-08-14 LAB — HIV ANTIBODY (ROUTINE TESTING W REFLEX): HIV 1&2 Ab, 4th Generation: NONREACTIVE

## 2017-08-14 LAB — TSH: TSH: 2.51 u[IU]/mL (ref 0.35–4.50)

## 2017-08-14 MED ORDER — IBUPROFEN 600 MG PO TABS: 600.0000 mg | ORAL_TABLET | Freq: Three times a day (TID) | ORAL | 0 refills | Status: DC | PRN

## 2017-08-14 MED ORDER — CYCLOBENZAPRINE HCL 10 MG PO TABS: 10.0000 mg | ORAL_TABLET | Freq: Three times a day (TID) | ORAL | 0 refills | Status: DC | PRN

## 2017-08-14 NOTE — Assessment & Plan Note (Signed)
Hx noted in chart and per patient report Update labs today F/U with further recommendations pending lab results - Hemoglobin A1c; Future - Lipid panel; Future - TSH; Future - Comprehensive metabolic panel; Future

## 2017-08-14 NOTE — Patient Instructions (Signed)
Please head downstairs for lab work. If any of your test results are critically abnormal, you will be contacted right away. Otherwise, I will contact you within a week about your test results and any recommendations for abnormalities.  Please call your psychiatrist to discuss smoking cessation, they may be able to adjust some of your medications to help you quit smoking. Let me know if you need more help from me.  Please schedule a follow up appointment for further evaluation of neck and knee pian here with Dr Tamala Julian or Dr Raeford Razor, our sports medicine providers.  I will plan to see you back in 1 year for annual physical, or sooner if needed   Health Maintenance, Female Adopting a healthy lifestyle and getting preventive care can go a long way to promote health and wellness. Talk with your health care provider about what schedule of regular examinations is right for you. This is a good chance for you to check in with your provider about disease prevention and staying healthy. In between checkups, there are plenty of things you can do on your own. Experts have done a lot of research about which lifestyle changes and preventive measures are most likely to keep you healthy. Ask your health care provider for more information. Weight and diet Eat a healthy diet  Be sure to include plenty of vegetables, fruits, low-fat dairy products, and lean protein.  Do not eat a lot of foods high in solid fats, added sugars, or salt.  Get regular exercise. This is one of the most important things you can do for your health. ? Most adults should exercise for at least 150 minutes each week. The exercise should increase your heart rate and make you sweat (moderate-intensity exercise). ? Most adults should also do strengthening exercises at least twice a week. This is in addition to the moderate-intensity exercise.  Maintain a healthy weight  Body mass index (BMI) is a measurement that can be used to identify  possible weight problems. It estimates body fat based on height and weight. Your health care provider can help determine your BMI and help you achieve or maintain a healthy weight.  For females 25 years of age and older: ? A BMI below 18.5 is considered underweight. ? A BMI of 18.5 to 24.9 is normal. ? A BMI of 25 to 29.9 is considered overweight. ? A BMI of 30 and above is considered obese.  Watch levels of cholesterol and blood lipids  You should start having your blood tested for lipids and cholesterol at 38 years of age, then have this test every 5 years.  You may need to have your cholesterol levels checked more often if: ? Your lipid or cholesterol levels are high. ? You are older than 38 years of age. ? You are at high risk for heart disease.  Cancer screening Lung Cancer  Lung cancer screening is recommended for adults 57-51 years old who are at high risk for lung cancer because of a history of smoking.  A yearly low-dose CT scan of the lungs is recommended for people who: ? Currently smoke. ? Have quit within the past 15 years. ? Have at least a 30-pack-year history of smoking. A pack year is smoking an average of one pack of cigarettes a day for 1 year.  Yearly screening should continue until it has been 15 years since you quit.  Yearly screening should stop if you develop a health problem that would prevent you from having lung cancer  treatment.  Breast Cancer  Practice breast self-awareness. This means understanding how your breasts normally appear and feel.  It also means doing regular breast self-exams. Let your health care provider know about any changes, no matter how small.  If you are in your 20s or 30s, you should have a clinical breast exam (CBE) by a health care provider every 1-3 years as part of a regular health exam.  If you are 44 or older, have a CBE every year. Also consider having a breast X-ray (mammogram) every year.  If you have a family history  of breast cancer, talk to your health care provider about genetic screening.  If you are at high risk for breast cancer, talk to your health care provider about having an MRI and a mammogram every year.  Breast cancer gene (BRCA) assessment is recommended for women who have family members with BRCA-related cancers. BRCA-related cancers include: ? Breast. ? Ovarian. ? Tubal. ? Peritoneal cancers.  Results of the assessment will determine the need for genetic counseling and BRCA1 and BRCA2 testing.  Cervical Cancer Your health care provider may recommend that you be screened regularly for cancer of the pelvic organs (ovaries, uterus, and vagina). This screening involves a pelvic examination, including checking for microscopic changes to the surface of your cervix (Pap test). You may be encouraged to have this screening done every 3 years, beginning at age 39.  For women ages 86-65, health care providers may recommend pelvic exams and Pap testing every 3 years, or they may recommend the Pap and pelvic exam, combined with testing for human papilloma virus (HPV), every 5 years. Some types of HPV increase your risk of cervical cancer. Testing for HPV may also be done on women of any age with unclear Pap test results.  Other health care providers may not recommend any screening for nonpregnant women who are considered low risk for pelvic cancer and who do not have symptoms. Ask your health care provider if a screening pelvic exam is right for you.  If you have had past treatment for cervical cancer or a condition that could lead to cancer, you need Pap tests and screening for cancer for at least 20 years after your treatment. If Pap tests have been discontinued, your risk factors (such as having a new sexual partner) need to be reassessed to determine if screening should resume. Some women have medical problems that increase the chance of getting cervical cancer. In these cases, your health care provider  may recommend more frequent screening and Pap tests.  Colorectal Cancer  This type of cancer can be detected and often prevented.  Routine colorectal cancer screening usually begins at 38 years of age and continues through 38 years of age.  Your health care provider may recommend screening at an earlier age if you have risk factors for colon cancer.  Your health care provider may also recommend using home test kits to check for hidden blood in the stool.  A small camera at the end of a tube can be used to examine your colon directly (sigmoidoscopy or colonoscopy). This is done to check for the earliest forms of colorectal cancer.  Routine screening usually begins at age 35.  Direct examination of the colon should be repeated every 5-10 years through 38 years of age. However, you may need to be screened more often if early forms of precancerous polyps or small growths are found.  Skin Cancer  Check your skin from head to toe regularly.  Tell your health care provider about any new moles or changes in moles, especially if there is a change in a mole's shape or color.  Also tell your health care provider if you have a mole that is larger than the size of a pencil eraser.  Always use sunscreen. Apply sunscreen liberally and repeatedly throughout the day.  Protect yourself by wearing long sleeves, pants, a wide-brimmed hat, and sunglasses whenever you are outside.  Heart disease, diabetes, and high blood pressure  High blood pressure causes heart disease and increases the risk of stroke. High blood pressure is more likely to develop in: ? People who have blood pressure in the high end of the normal range (130-139/85-89 mm Hg). ? People who are overweight or obese. ? People who are African American.  If you are 40-86 years of age, have your blood pressure checked every 3-5 years. If you are 51 years of age or older, have your blood pressure checked every year. You should have your  blood pressure measured twice-once when you are at a hospital or clinic, and once when you are not at a hospital or clinic. Record the average of the two measurements. To check your blood pressure when you are not at a hospital or clinic, you can use: ? An automated blood pressure machine at a pharmacy. ? A home blood pressure monitor.  If you are between 5 years and 72 years old, ask your health care provider if you should take aspirin to prevent strokes.  Have regular diabetes screenings. This involves taking a blood sample to check your fasting blood sugar level. ? If you are at a normal weight and have a low risk for diabetes, have this test once every three years after 38 years of age. ? If you are overweight and have a high risk for diabetes, consider being tested at a younger age or more often. Preventing infection Hepatitis B  If you have a higher risk for hepatitis B, you should be screened for this virus. You are considered at high risk for hepatitis B if: ? You were born in a country where hepatitis B is common. Ask your health care provider which countries are considered high risk. ? Your parents were born in a high-risk country, and you have not been immunized against hepatitis B (hepatitis B vaccine). ? You have HIV or AIDS. ? You use needles to inject street drugs. ? You live with someone who has hepatitis B. ? You have had sex with someone who has hepatitis B. ? You get hemodialysis treatment. ? You take certain medicines for conditions, including cancer, organ transplantation, and autoimmune conditions.  Hepatitis C  Blood testing is recommended for: ? Everyone born from [birth year] through 1966. ? Anyone with known risk factors for hepatitis C.  Sexually transmitted infections (STIs)  You should be screened for sexually transmitted infections (STIs) including gonorrhea and chlamydia if: ? You are sexually active and are younger than 38 years of age. ? You are older than 38  years of age and your health care provider tells you that you are at risk for this type of infection. ? Your sexual activity has changed since you were last screened and you are at an increased risk for chlamydia or gonorrhea. Ask your health care provider if you are at risk.  If you do not have HIV, but are at risk, it may be recommended that you take a prescription medicine daily to prevent HIV infection. This is called  pre-exposure prophylaxis (PrEP). You are considered at risk if: ? You are sexually active and do not regularly use condoms or know the HIV status of your partner(s). ? You take drugs by injection. ? You are sexually active with a partner who has HIV.  Talk with your health care provider about whether you are at high risk of being infected with HIV. If you choose to begin PrEP, you should first be tested for HIV. You should then be tested every 3 months for as long as you are taking PrEP. Pregnancy  If you are premenopausal and you may become pregnant, ask your health care provider about preconception counseling.  If you may become pregnant, take 400 to 800 micrograms (mcg) of folic acid every day.  If you want to prevent pregnancy, talk to your health care provider about birth control (contraception). Osteoporosis and menopause  Osteoporosis is a disease in which the bones lose minerals and strength with aging. This can result in serious bone fractures. Your risk for osteoporosis can be identified using a bone density scan.  If you are 67 years of age or older, or if you are at risk for osteoporosis and fractures, ask your health care provider if you should be screened.  Ask your health care provider whether you should take a calcium or vitamin D supplement to lower your risk for osteoporosis.  Menopause may have certain physical symptoms and risks.  Hormone replacement therapy may reduce some of these symptoms and risks. Talk to your health care provider about whether  hormone replacement therapy is right for you. Follow these instructions at home:  Schedule regular health, dental, and eye exams.  Stay current with your immunizations.  Do not use any tobacco products including cigarettes, chewing tobacco, or electronic cigarettes.  If you are pregnant, do not drink alcohol.  If you are breastfeeding, limit how much and how often you drink alcohol.  Limit alcohol intake to no more than 1 drink per day for nonpregnant women. One drink equals 12 ounces of beer, 5 ounces of wine, or 1 ounces of hard liquor.  Do not use street drugs.  Do not share needles.  Ask your health care provider for help if you need support or information about quitting drugs.  Tell your health care provider if you often feel depressed.  Tell your health care provider if you have ever been abused or do not feel safe at home. This information is not intended to replace advice given to you by your health care provider. Make sure you discuss any questions you have with your health care provider. Document Released: 07/10/2010 Document Revised: 06/02/2015 Document Reviewed: 09/28/2014 Elsevier Interactive Patient Education  [employer].

## 2017-08-14 NOTE — Progress Notes (Signed)
Name: [name]   MRN: [id number]    DOB: September 25, 1980   Date:08/14/2017       Progress Note  Subjective  Chief Complaint  Chief Complaint  Patient presents with  . Establish Care    CPE, fasting    HPI  Ms [name] is here today to establish care, transferring from another provider in the same practice. Aside from primary care, she is routinely followed by psychiatry for mood disorder and gynecology for routine womens care. Patient presents for annual CPE. She is also requesting a refill of ibuprofen, flexeril and valium, which she takes PRN for chronic knee and neck pain. She was last evaluated years ago by orthopedic provider for her pain.  Diet and Exercise: does not routinely watch diet or exercise  USPSTF grade A and B recommendations  Depression:  Depression screen [facility] 2/9 02/09/2015 10/19/2014  Decreased Interest 1 0  Down, Depressed, Hopeless 1 1  PHQ - 2 Score 2 1  Altered sleeping 0 -  Tired, decreased energy 2 -  Change in appetite 0 -  Feeling bad or failure about yourself  1 -  Trouble concentrating 0 -  Moving slowly or fidgety/restless 0 -  PHQ-9 Score 5 -  Difficult doing work/chores Not difficult at all -   Hypertension: BP Readings from Last 3 Encounters:  08/14/17 114/78  12/04/16 124/84  11/07/16 132/86   Obesity: Wt Readings from Last 3 Encounters:  08/14/17 292 lb (132.5 kg)  12/04/16 295 lb 9.6 oz (134.1 kg)  11/07/16 291 lb (132 kg)   BMI Readings from Last 3 Encounters:  08/14/17 53.41 kg/m  12/04/16 52.36 kg/m  11/07/16 51.55 kg/m    Alcohol: rare social drink Tobacco use: wants to quit, has tried to quit several times cold [place] but always restarts, smoking for about 20 years on and off  HIV screening: will screen today  STD testing and prevention (chl/gon/syphilis): no concerns, declines  Intimate partner violence:denies, feels safe  Vaccinations: up to date  Advanced Care Planning: A voluntary discussion about advance care  planning including the explanation and discussion of advance directives.  Discussed health care proxy and Living will, and the patient DOES NOT have a living will at present time. If patient does have living will, I have requested they bring this to the clinic to be scanned in to their chart.  Cervical cancer screening: routine womens care by GYN provider, PAP up to date  Lipids: lipid panel today, elevated in the past but has not been on medications for HLD Lab Results  Component Value Date   CHOL 234 (H) 03/13/2016   CHOL 220 (H) 02/09/2015   CHOL 204 (H) 01/07/2014   Lab Results  Component Value Date   HDL 52 03/13/2016   HDL 37.40 (L) 02/09/2015   HDL 31.70 (L) 01/07/2014   Lab Results  Component Value Date   LDLCALC 152 (H) 02/09/2015   LDLCALC 133 (H) 01/07/2014   LDLCALC 114 (H) 05/17/2011   Lab Results  Component Value Date   TRIG 104 03/13/2016   TRIG 151.0 (H) 02/09/2015   TRIG 197.0 (H) 01/07/2014   Lab Results  Component Value Date   CHOLHDL 6 02/09/2015   CHOLHDL 6 01/07/2014   CHOLHDL 5 09/09/2012   Lab Results  Component Value Date   LDLDIRECT 111.5 09/09/2012    Glucose: A1c today, hx gestational and pre-diabetes Glucose, Bld  Date Value Ref Range Status  10/29/2016 131 (H) 65 - 99 mg/dL  Final  03/13/2016 100 (H) 70 - 99 mg/dL Final  10/19/2015 84 65 - 99 mg/dL Final   Glucose-Capillary  Date Value Ref Range Status  12/22/2014 73 65 - 99 mg/dL Final  12/21/2014 118 (H) 65 - 99 mg/dL Final  12/21/2014 136 (H) 65 - 99 mg/dL Final    Skin cancer: does not routinely wear sunscreen   ECG: not indicated   Patient Active Problem List   Diagnosis Date Noted  . Nicotine dependence, cigarettes, uncomplicated 99/83/3825  . Fatigue 11/03/2015  . Atypical chest pain 11/03/2015  . Foot pain, left 09/13/2015  . Allergic rhinitis 05/16/2015  . Finger pain, right 05/16/2015  . S/P C-section 12/18/2014  . Hyperlipidemia 12/16/2014  . Hx of herpes  simplex infection 11/29/2014  . Major depressive disorder, recurrent, severe without psychotic features ([place]) 09/23/2014  . GAD (generalized anxiety disorder) 09/23/2014  . Social anxiety disorder 09/23/2014  . Routine general medical examination at a health care facility 01/07/2014  . OSA on CPAP 11/26/2011  . Morbid obesity ([place]) 05/17/2011  . Right knee pain 02/16/2011  . Depression 02/16/2011  . Hx gestational diabetes 02/16/2011  . Eczema 02/16/2011    Past Surgical History:  Procedure Laterality Date  . CESAREAN SECTION  09 & 12   x's 2  . CESAREAN SECTION WITH BILATERAL TUBAL LIGATION Bilateral 12/18/2014   Procedure: REPEAT CESAREAN SECTION WITH BILATERAL TUBAL LIGATION;  Surgeon: Crawford Givens, MD;  Location: [facility];  Service: Obstetrics;  Laterality: Bilateral;  . COLPOSCOPY    . DENTAL SURGERY    . Ganglion removal from (L) wrist  1998  . LAPAROSCOPIC UNILATERAL SALPINGECTOMY Left 01/26/2015   Procedure: LAPAROSCOPIC UNILATERAL SALPINGECTOMY;  Surgeon: Crawford Givens, MD;  Location: [facility];  Service: Gynecology;  Laterality: Left;  . WISDOM TOOTH EXTRACTION      Family History  Problem Relation Age of Onset  . Arthritis Mother   . Alcohol abuse Mother   . Mental illness Mother   . Cancer Mother 22       squamous - unknown primary  . Coronary artery disease Mother 34       MI with stent  . Anxiety disorder Mother   . Depression Mother   . Arthritis Father   . Alcohol abuse Father   . Hyperlipidemia Father   . Heart disease Father   . Kidney disease Father   . Stroke Father   . Mental illness Father   . Drug abuse Father   . Diabetes Maternal Grandmother   . Heart disease Maternal Grandmother   . Stroke Maternal Grandfather   . Hypertension Paternal Grandfather   . Hyperlipidemia Paternal Grandfather   . Arthritis Other   . Alcohol abuse Other   . Breast cancer Paternal Aunt   . Anxiety disorder Sister     Social History   Socioeconomic History  .  Marital status: Married    Spouse name: Not on file  . Number of children: 3  . Years of education: 33  . Highest education level: Not on file  Occupational History  . Occupation: Group Transport planner  . Financial resource strain: Not on file  . Food insecurity:    Worry: Not on file    Inability: Not on file  . Transportation needs:    Medical: Not on file    Non-medical: Not on file  Tobacco Use  . Smoking status: Current Every Day Smoker    Packs/day: 0.30    Years:  15.00    Pack years: 4.50    Types: Cigarettes    Last attempt to quit: 06/24/2016    Years since quitting: 1.1  . Smokeless tobacco: Never Used  . Tobacco comment: Reports started back and trying ot quit again  Substance and Sexual Activity  . Alcohol use: Yes    Comment: once a month or less  . Drug use: No  . Sexual activity: Yes    Partners: Male    Birth control/protection: None  Lifestyle  . Physical activity:    Days per week: Not on file    Minutes per session: Not on file  . Stress: Not on file  Relationships  . Social connections:    Talks on phone: Not on file    Gets together: Not on file    Attends religious service: Not on file    Active member of club or organization: Not on file    Attends meetings of clubs or organizations: Not on file    Relationship status: Not on file  . Intimate partner violence:    Fear of current or ex partner: Not on file    Emotionally abused: Not on file    Physically abused: Not on file    Forced sexual activity: Not on file  Other Topics Concern  . Not on file  Social History Narrative   Secretary   Married, lives with spouse and 2 kids   Moved to [place] summer 2011 to be near mom - originally from [place]   Denies abuse and feels safe at home.     Current Outpatient Medications:  .  busPIRone (BUSPAR) 5 MG tablet, Take 1 tablet (5 mg total) by mouth 2 (two) times daily., Disp: 180 tablet, Rfl: 0 .  FLUoxetine (PROZAC) 20 MG capsule, Take 3  capsules (60 mg total) by mouth daily., Disp: 270 capsule, Rfl: 0 .  ibuprofen (ADVIL,MOTRIN) 600 MG tablet, TAKE 1 TABLET BY MOUTH EVERY 6 HOURS AS NEEDED FOR HEADACHE, MILD OR MODERATE PAIN, Disp: 90 tablet, Rfl: 0  Allergies  Allergen Reactions  . Codeine     STOMACH CRAMPING  . Doxycycline Nausea And Vomiting     ROS  Constitutional: Negative for fever or weight change.  Respiratory: Negative for cough and shortness of breath.   Cardiovascular: Negative for chest pain or palpitations.  Gastrointestinal: Negative for abdominal pain, no bowel changes.  Musculoskeletal: Negative for gait problem or joint swelling.  Skin: Negative for rash.  Neurological: Negative for dizziness or headache.  No other specific complaints in a complete review of systems (except as listed in HPI above).   Objective  Vitals:   08/14/17 0813  BP: 114/78  Pulse: 76  Resp: 16  Temp: 99 F (37.2 C)  TempSrc: Oral  SpO2: 97%  Weight: 292 lb (132.5 kg)  Height: 5\' 2"  (1.575 m)    Body mass index is 53.41 kg/m.  Physical Exam Vital signs reviewed. Constitutional: Patient appears well-developed and well-nourished. No distress.  HENT: Head: Normocephalic and atraumatic. Ears: B TMs ok, no erythema or effusion; Nose: Nose normal. Mouth/Throat: Oropharynx is clear and moist. No oropharyngeal exudate.  Eyes: Conjunctivae and EOM are normal. Pupils are equal, round, and reactive to light. No scleral icterus.  Neck: Normal range of motion. Neck supple. No cervical adenopathy. No thyromegaly present.  Cardiovascular: Normal rate, regular rhythm and normal heart sounds.  No murmur heard. No BLE edema. Distal pulses intact. Pulmonary/Chest: Effort normal and breath sounds normal.  No respiratory distress. Abdominal: Soft, rotund. Bowel sounds are normal, no distension. There is no tenderness. no masses Breast: defd to GYN FEMALE GENITALIA:  Deferred to GYN Musculoskeletal: Normal range of motion. No  gross deformities Neurological: She is alert and oriented to person, place, and time. No cranial nerve deficit. Coordination, balance, strength, speech and gait are normal.  Skin: Skin is warm and dry. No rash noted. No erythema.  Psychiatric: Patient has a normal mood and affect. behavior is normal. Judgment and thought content normal.   Assessment & Plan RTC in 1 year for CPE  Encounter for smoking cessation counseling Recommend follow up with psychiatry to discuss options for medication adjustment/treatment change to treat nicotine addiction She will follow back up with me If needed

## 2017-08-14 NOTE — Assessment & Plan Note (Addendum)
-  USPSTF grade A and B recommendations reviewed with patient; age-appropriate recommendations, preventive care, screening tests, etc discussed and encouraged; healthy living encouraged; see AVS for patient education given to patient. Advanced directives packet provided. -Discussed importance of 150 minutes of physical activity weekly,  eat 6 servings of fruit/vegetables daily and drink plenty of water and avoid sweet beverages, Follow up and care instructions discussed and provided in AVS.  -Reviewed Health Maintenance: Screening for HIV (human immunodeficiency virus)- HIV antibody; Future  Morbid obesity ([place]) - CBC; Future - Hemoglobin A1c; Future - Lipid panel; Future - TSH; Future - Comprehensive metabolic panel; Future

## 2017-08-14 NOTE — Assessment & Plan Note (Signed)
Refills of ibuprofen and flexeril provided today. No refill for valium provided today due to high risk drug, risks for adverse reactions which we discussed today Recommend follow up with sports medicine provider in our clinic for further evaluation and she is agreeable, instructions given to schedule appointment  - ibuprofen (ADVIL,MOTRIN) 600 MG tablet; Take 1 tablet (600 mg total) by mouth every 8 (eight) hours as needed for moderate pain.  Dispense: 90 tablet; Refill: 0 - cyclobenzaprine (FLEXERIL) 10 MG tablet; Take 1 tablet (10 mg total) by mouth 3 (three) times daily as needed for muscle spasms.  Dispense: 30 tablet; Refill: 0

## 2017-08-14 NOTE — Assessment & Plan Note (Addendum)
Refills of ibuprofen and flexeril provided today. No refill for valium provided today due to high risk drug, risks for adverse reactions which we discussed today Recommend follow up with sports medicine provider in our clinic for further evaluation and she is agreeable, instructions given to schedule appointment  - cyclobenzaprine (FLEXERIL) 10 MG tablet; Take 1 tablet (10 mg total) by mouth 3 (three) times daily as needed for muscle spasms.  Dispense: 30 tablet; Refill: 0 - ibuprofen (ADVIL,MOTRIN) 600 MG tablet; Take 1 tablet (600 mg total) by mouth every 8 (eight) hours as needed for moderate pain.  Dispense: 90 tablet; Refill: 0

## 2017-08-14 NOTE — Assessment & Plan Note (Addendum)
Update labs- she is fasting today F/U with further recommendations pending lab results - CBC; Future - Lipid panel; Future - TSH; Future - Comprehensive metabolic panel; Future

## 2017-08-19 ENCOUNTER — Ambulatory Visit (INDEPENDENT_AMBULATORY_CARE_PROVIDER_SITE_OTHER): Payer: Federal, State, Local not specified - PPO | Admitting: Psychology

## 2017-08-19 ENCOUNTER — Telehealth (HOSPITAL_COMMUNITY): Payer: Self-pay

## 2017-08-19 DIAGNOSIS — F330 Major depressive disorder, recurrent, mild: Secondary | ICD-10-CM | POA: Diagnosis not present

## 2017-08-19 DIAGNOSIS — F411 Generalized anxiety disorder: Secondary | ICD-10-CM | POA: Diagnosis not present

## 2017-08-19 NOTE — Telephone Encounter (Signed)
Patient would like to know if she can switch from Prozac to Wellbutrin, or add Wellbutrin to her medications. Her PCP is recommending it to help patient quit smoking. Please review and advise, thank you

## 2017-08-19 NOTE — Progress Notes (Signed)
   THERAPIST PROGRESS NOTE  Session Time: 12.33pm-1.25pm  Participation Level: Active  Behavioral Response: Well GroomedAlertaffect bright.  Type of Therapy: Individual Therapy  Treatment Goals addressed: Diagnosis: MDD, GAD and goal 1.  Interventions: CBT and Supportive  Summary: [name] is a 37 y.o. female who presents with affect wnl.  Pt has been in her new job and "loves it".  Pt reports feels purpose w/ work again and feels that good transition.  Pt reported overall mood is good- sleep is still a struggle on and off.  Pt reports on stressors w/ family interactions- dad relapse and trying to have good boundaries w/out guilt.  Pt discussed challenges w/ daughter- her social anxiety and getting her support.  Pt reported on other stressors w/ neighbors, financial, trying to maintain balance of self care, work, caretaking.  P.   Suicidal/Homicidal: Nowithout intent/plan  Therapist Response: Assessed pt current functioning per pt report. Processed w/pt coping w/ stressors- reflecting that pt using supports and self care appropriate.  Assisted w/ reframing guilt and reiterating her healthy boundaries w/ dad.  Plan: Return again in 2 weeks.  Diagnosis: GAD, MDD   Bienvenido Proehl, [facility] 08/19/2017

## 2017-08-29 NOTE — Telephone Encounter (Signed)
I recommend PCP add Wellbutrin for smoking cessation

## 2017-09-02 ENCOUNTER — Ambulatory Visit (HOSPITAL_COMMUNITY): Payer: Self-pay | Admitting: Psychology

## 2017-09-02 ENCOUNTER — Encounter (HOSPITAL_COMMUNITY): Payer: Self-pay | Admitting: Psychology

## 2017-09-02 NOTE — Progress Notes (Signed)
[name] is a 37 y.o. female patient who didn't show for appointment.  Pt is scheduled for return on 09/16/17.        [name], LPC

## 2017-09-15 ENCOUNTER — Emergency Department (HOSPITAL_COMMUNITY)
Admission: EM | Admit: 2017-09-15 | Discharge: 2017-09-16 | Disposition: A | Discharge status: Other Acute Inpatient Hospital | Payer: Federal, State, Local not specified - PPO | Attending: Emergency Medicine | Admitting: Emergency Medicine

## 2017-09-15 ENCOUNTER — Encounter (HOSPITAL_COMMUNITY): Payer: Self-pay | Admitting: Emergency Medicine

## 2017-09-15 DIAGNOSIS — F322 Major depressive disorder, single episode, severe without psychotic features: Secondary | ICD-10-CM | POA: Insufficient documentation

## 2017-09-15 DIAGNOSIS — T43222A Poisoning by selective serotonin reuptake inhibitors, intentional self-harm, initial encounter: Secondary | ICD-10-CM | POA: Diagnosis not present

## 2017-09-15 DIAGNOSIS — F332 Major depressive disorder, recurrent severe without psychotic features: Secondary | ICD-10-CM | POA: Diagnosis not present

## 2017-09-15 DIAGNOSIS — I10 Essential (primary) hypertension: Secondary | ICD-10-CM | POA: Diagnosis not present

## 2017-09-15 DIAGNOSIS — Z79899 Other long term (current) drug therapy: Secondary | ICD-10-CM | POA: Diagnosis not present

## 2017-09-15 DIAGNOSIS — Z599 Problem related to housing and economic circumstances, unspecified: Secondary | ICD-10-CM | POA: Diagnosis not present

## 2017-09-15 DIAGNOSIS — F17210 Nicotine dependence, cigarettes, uncomplicated: Secondary | ICD-10-CM | POA: Insufficient documentation

## 2017-09-15 DIAGNOSIS — R110 Nausea: Secondary | ICD-10-CM | POA: Diagnosis not present

## 2017-09-15 DIAGNOSIS — T1491XA Suicide attempt, initial encounter: Secondary | ICD-10-CM | POA: Diagnosis not present

## 2017-09-15 DIAGNOSIS — F29 Unspecified psychosis not due to a substance or known physiological condition: Secondary | ICD-10-CM | POA: Diagnosis not present

## 2017-09-15 DIAGNOSIS — R400 Somnolence: Secondary | ICD-10-CM | POA: Diagnosis not present

## 2017-09-15 DIAGNOSIS — T50902A Poisoning by unspecified drugs, medicaments and biological substances, intentional self-harm, initial encounter: Secondary | ICD-10-CM

## 2017-09-15 DIAGNOSIS — R45851 Suicidal ideations: Secondary | ICD-10-CM | POA: Diagnosis not present

## 2017-09-15 LAB — CBC WITH DIFFERENTIAL/PLATELET
Basophils Absolute: 0 10*3/uL (ref 0.0–0.1)
Basophils Relative: 0 %
EOS ABS: 0.1 10*3/uL (ref 0.0–0.7)
Eosinophils Relative: 1 %
HCT: 39.7 % (ref 36.0–46.0)
Hemoglobin: 13 g/dL (ref 12.0–15.0)
LYMPHS ABS: 1.5 10*3/uL (ref 0.7–4.0)
Lymphocytes Relative: 19 %
MCH: 29.3 pg (ref 26.0–34.0)
MCHC: 32.7 g/dL (ref 30.0–36.0)
MCV: 89.4 fL (ref 78.0–100.0)
MONOS PCT: 3 %
Monocytes Absolute: 0.3 10*3/uL (ref 0.1–1.0)
Neutro Abs: 6 10*3/uL (ref 1.7–7.7)
Neutrophils Relative %: 77 %
PLATELETS: 288 10*3/uL (ref 150–400)
RBC: 4.44 MIL/uL (ref 3.87–5.11)
RDW: 13.7 % (ref 11.5–15.5)
WBC: 7.8 10*3/uL (ref 4.0–10.5)

## 2017-09-15 LAB — COMPREHENSIVE METABOLIC PANEL
ALK PHOS: 79 U/L (ref 38–126)
ALT: 19 U/L (ref 0–44)
AST: 16 U/L (ref 15–41)
Albumin: 3.9 g/dL (ref 3.5–5.0)
Anion gap: 6 (ref 5–15)
BILIRUBIN TOTAL: 0.5 mg/dL (ref 0.3–1.2)
BUN: 10 mg/dL (ref 6–20)
CALCIUM: 9 mg/dL (ref 8.9–10.3)
CO2: 27 mmol/L (ref 22–32)
Chloride: 105 mmol/L (ref 98–111)
Creatinine, Ser: 0.93 mg/dL (ref 0.44–1.00)
GFR calc Af Amer: >60 mL/min (ref >60)
Glucose, Bld: 126 mg/dL — ABNORMAL HIGH (ref 70–99)
POTASSIUM: 4.1 mmol/L (ref 3.5–5.1)
Sodium: 138 mmol/L (ref 135–145)
TOTAL PROTEIN: 7 g/dL (ref 6.5–8.1)

## 2017-09-15 LAB — ETHANOL: Alcohol, Ethyl (B): <10 mg/dL (ref <10)

## 2017-09-15 LAB — RAPID URINE DRUG SCREEN, HOSP PERFORMED
Amphetamines: NOT DETECTED
BARBITURATES: NOT DETECTED
Benzodiazepines: NOT DETECTED
COCAINE: NOT DETECTED
Opiates: NOT DETECTED
TETRAHYDROCANNABINOL: NOT DETECTED

## 2017-09-15 LAB — SALICYLATE LEVEL: Salicylate Lvl: <7 mg/dL (ref 2.8–30.0)

## 2017-09-15 LAB — ACETAMINOPHEN LEVEL

## 2017-09-15 LAB — PREGNANCY, URINE: PREG TEST UR: NEGATIVE

## 2017-09-15 MED ORDER — CHARCOAL ACTIVATED PO LIQD
75.0000 g | Freq: Once | ORAL | Status: AC
  Administered 2017-09-15: 75 g via ORAL
  Filled 2017-09-15: qty 480

## 2017-09-15 MED ORDER — IBUPROFEN 200 MG PO TABS: 600.0000 mg | ORAL_TABLET | Freq: Three times a day (TID) | ORAL | Status: DC | PRN

## 2017-09-15 MED ORDER — ALUM & MAG HYDROXIDE-SIMETH 200-200-20 MG/5ML PO SUSP: 30.0000 mL | Freq: Four times a day (QID) | ORAL | Status: DC | PRN

## 2017-09-15 MED ORDER — BUSPIRONE HCL 10 MG PO TABS
5.0000 mg | ORAL_TABLET | Freq: Two times a day (BID) | ORAL | Status: DC
  Administered 2017-09-16: 5 mg via ORAL
  Filled 2017-09-15: qty 1

## 2017-09-15 MED ORDER — CYCLOBENZAPRINE HCL 10 MG PO TABS: 10.0000 mg | ORAL_TABLET | Freq: Three times a day (TID) | ORAL | Status: DC | PRN

## 2017-09-15 MED ORDER — NICOTINE 21 MG/24HR TD PT24: 21.0000 mg | MEDICATED_PATCH | Freq: Every day | TRANSDERMAL | Status: DC

## 2017-09-15 MED ORDER — ACETAMINOPHEN 325 MG PO TABS: 650.0000 mg | ORAL_TABLET | ORAL | Status: DC | PRN

## 2017-09-15 MED ORDER — ZOLPIDEM TARTRATE 5 MG PO TABS: 5.0000 mg | ORAL_TABLET | Freq: Every evening | ORAL | Status: DC | PRN

## 2017-09-15 MED ORDER — ONDANSETRON HCL 4 MG PO TABS: 4.0000 mg | ORAL_TABLET | Freq: Three times a day (TID) | ORAL | Status: DC | PRN

## 2017-09-15 MED ORDER — CHARCOAL ACTIVATED PO LIQD: 50.0000 g | Freq: Once | ORAL | Status: DC

## 2017-09-15 NOTE — BH Assessment (Signed)
Tele Assessment Note   Patient Name: [name] MRN: [id number] Referring Physician: Dr. Regenia Skeeter Location of Patient: [phone] Location of Provider: [place] is an 37 y.o. female with attempted overdose on 20 tablets of 20 mg fluoxetine. Patient reported calling EMS and trying to vomit pills up. Patient reported she wasn't able to and became scared. Patient reported triggers of suicide was "financial problems, continual arguments with husband, guilt of not allowing father whom relapsed move into her house, not having money, kids needing clothes for school, everything just adding up". Patient reported telling husband she would take her kids and leave him, then realized that her husband is good to the kids and then came up with the idea "they would be better off with out me". Patient reported "I regret it soon as I did it". Patient reside with husband, children (34, 7 and 2), along with mother in law. Patient currently seeing Dr. Doyne Keel, psychiatrist, at [facility] and has an appt with her therapist Leann at [facility] at 12:30pm. Patient symptoms of depression include, fatigued, down on self, lack of interest, guilty, irritability and crying spells. Patient denied HI and psychosis. ETOH negative. UDS negative.  Patient cooperative during assessment. Patient alert and oriented x4. Thought processes coherent. Mood is depressed, despair, helpless and sad. Affect congruent to mood. Speech logical and coherent.   Disposition Initial Assessment Completed for this Encounter: Yes  Patriciaann Clan, PA, patient meets inpatient criteria. TTS to secure placement. Dr. Regenia Skeeter and Nila Nephew, RN, notified of disposition.  Diagnosis: Major depressive disorder, recurrent, severe and History of Anxiety disorder  Past Medical History:  Past Medical History:  Diagnosis Date  . Anxiety   . Cluster headaches   . Depression   . Eczema   . GERD  (gastroesophageal reflux disease)   . Gestational diabetes 2012, 2016  . Hyperlipidemia   . Hypertension    no meds currently  . Sciatica   . Sleep apnea   . Snoring    sleep study 02/2012 with min AHI events    Past Surgical History:  Procedure Laterality Date  . CESAREAN SECTION  09 & 12   x's 2  . CESAREAN SECTION WITH BILATERAL TUBAL LIGATION Bilateral 12/18/2014   Procedure: REPEAT CESAREAN SECTION WITH BILATERAL TUBAL LIGATION;  Surgeon: Crawford Givens, MD;  Location: [facility];  Service: Obstetrics;  Laterality: Bilateral;  . COLPOSCOPY    . DENTAL SURGERY    . Ganglion removal from (L) wrist  1998  . LAPAROSCOPIC UNILATERAL SALPINGECTOMY Left 01/26/2015   Procedure: LAPAROSCOPIC UNILATERAL SALPINGECTOMY;  Surgeon: Crawford Givens, MD;  Location: [facility];  Service: Gynecology;  Laterality: Left;  . WISDOM TOOTH EXTRACTION      Family History:  Family History  Problem Relation Age of Onset  . Arthritis Mother   . Alcohol abuse Mother   . Mental illness Mother   . Cancer Mother 63       squamous - unknown primary  . Coronary artery disease Mother 19       MI with stent  . Anxiety disorder Mother   . Depression Mother   . Arthritis Father   . Alcohol abuse Father   . Hyperlipidemia Father   . Heart disease Father   . Kidney disease Father   . Stroke Father   . Mental illness Father   . Drug abuse Father   . Diabetes Maternal Grandmother   . Heart disease Maternal Grandmother   .  Stroke Maternal Grandfather   . Hypertension Paternal Grandfather   . Hyperlipidemia Paternal Grandfather   . Arthritis Other   . Alcohol abuse Other   . Breast cancer Paternal Aunt   . Anxiety disorder Sister     Social History:  reports that she has been smoking cigarettes. She has a 4.50 pack-year smoking history. She has never used smokeless tobacco. She reports that she drinks alcohol. She reports that she does not use drugs.  Additional Social History:  Alcohol / Drug Use Pain  Medications: see MAR Prescriptions: see MAR Over the Counter: see MAR  CIWA: CIWA-Ar BP: (!) 194/80 Pulse Rate: 93 COWS:    Allergies:  Allergies  Allergen Reactions  . Codeine     STOMACH CRAMPING  . Doxycycline Nausea And Vomiting    Home Medications:  (Not in a hospital admission)  OB/GYN Status:  Patient's last menstrual period was 09/01/2017.  General Assessment Data Location of Assessment: [facility] TTS Assessment: In system Is this a Tele or Face-to-Face Assessment?: Tele Assessment Is this an Initial Assessment or a Re-assessment for this encounter?: Initial Assessment Patient Accompanied by:: (self) Language Other than English: No Living Arrangements: (family) What gender do you identify as?: Female Marital status: Married Pregnancy Status: Unknown Living Arrangements: Spouse/significant other, Children, Other (Comment)(spouse, children and mother n law) Can pt return to current living arrangement?: Yes Admission Status: Voluntary Is patient capable of signing voluntary admission?: Yes Referral Source: Self/Family/Friend     Crisis Care Plan Living Arrangements: Spouse/significant other, Children, Other (Comment)(spouse, children and mother n law) Legal Guardian: (self) Name of Psychiatrist: (Dr. Doyne Keel at [facility]) Name of Therapist: Marylin Crosby at [facility])  Education Status Is patient currently in school?: No Is the patient employed, unemployed or receiving disability?: Employed  Risk to self with the past 6 months Suicidal Ideation: Yes-Currently Present Has patient been a risk to self within the past 6 months prior to admission? : No Suicidal Intent: Yes-Currently Present Has patient had any suicidal intent within the past 6 months prior to admission? : No Is patient at risk for suicide?: Yes Suicidal Plan?: Yes-Currently Present Has patient had any suicidal plan within the past 6 months prior to admission? : No Specify  Current Suicidal Plan: (attempted overdose on 20 pills) Access to Means: Yes Specify Access to Suicidal Means: (medication pills in the home) What has been your use of drugs/alcohol within the last 12 months?: (drink occasionally) Previous Attempts/Gestures: No How many times?: (denied) Other Self Harm Risks: (denied) Triggers for Past Attempts: None known Intentional Self Injurious Behavior: None Family Suicide History: No Recent stressful life event(s): Financial Problems(father relapsed, no money, discord with husband) Persecutory voices/beliefs?: No Depression: Yes Depression Symptoms: Tearfulness, Fatigue, Guilt, Loss of interest in usual pleasures, Feeling worthless/self pity, Feeling angry/irritable Substance abuse history and/or treatment for substance abuse?: No  Risk to Others within the past 6 months Homicidal Ideation: No Does patient have any lifetime risk of violence toward others beyond the six months prior to admission? : No Thoughts of Harm to Others: No Current Homicidal Intent: No Current Homicidal Plan: No Access to Homicidal Means: No Identified Victim: (n/a) History of harm to others?: No Assessment of Violence: None Noted Violent Behavior Description: (n/a) Does patient have access to weapons?: No Criminal Charges Pending?: No Does patient have a court date: No Is patient on probation?: No  Psychosis Hallucinations: None noted Delusions: None noted  Mental Status Report Appearance/Hygiene: Unremarkable Eye Contact: Fair  Motor Activity: Unremarkable Speech: Logical/coherent Level of Consciousness: Alert Mood: Depressed, Despair, Helpless, Sad Affect: Depressed, Sad Anxiety Level: Minimal Thought Processes: Coherent Judgement: Impaired Orientation: Person, Place, Time, Situation Obsessive Compulsive Thoughts/Behaviors: None  Cognitive Functioning Concentration: Normal Memory: Recent Intact Is patient IDD: No Insight: Poor Impulse Control:  Poor Appetite: Good Have you had any weight changes? : No Change Sleep: No Change Total Hours of Sleep: (8-9) Vegetative Symptoms: None  ADLScreening [facility] Assessment Services) Patient's cognitive ability adequate to safely complete daily activities?: Yes Patient able to express need for assistance with ADLs?: Yes Independently performs ADLs?: Yes (appropriate for developmental age)  Prior Inpatient Therapy Prior Inpatient Therapy: No  Prior Outpatient Therapy Prior Outpatient Therapy: No Does patient have an ACCT team?: No Does patient have Intensive In-House Services?  : No Does patient have [facility] services? : No Does patient have P4CC services?: No  ADL Screening (condition at time of admission) Patient's cognitive ability adequate to safely complete daily activities?: Yes Patient able to express need for assistance with ADLs?: Yes Independently performs ADLs?: Yes (appropriate for developmental age)             Regulatory affairs officer (For Healthcare) Does Patient Have a Medical Advance Directive?: No          Disposition:  Disposition Initial Assessment Completed for this Encounter: Yes  Patriciaann Clan, PA, patient meets inpatient criteria. TTS to secure placement. Dr. Regenia Skeeter and Nila Nephew, RN, notified of disposition.  This service was provided via telemedicine using a 2-way, interactive audio and video technology.  Names of all persons participating in this telemedicine service and their role in this encounter. Name: Anabel Halon Role: patient  Name: Kirtland Bouchard, [facility] Role: TTS Clinician  Name:  Role:   Name:  Role:     Venora Maples 09/15/2017 10:25 PM

## 2017-09-15 NOTE — ED Notes (Signed)
Tele-psych at bedside.

## 2017-09-15 NOTE — ED Notes (Addendum)
Patient's family at bedside

## 2017-09-15 NOTE — ED Notes (Signed)
One labeled patient belongings bag placed at nurse's station.

## 2017-09-15 NOTE — ED Notes (Signed)
Patient given ravioli, sandwich, and cheese stick.

## 2017-09-15 NOTE — ED Notes (Signed)
ED Provider at bedside. 

## 2017-09-15 NOTE — ED Notes (Signed)
Sitter at bedside.

## 2017-09-15 NOTE — ED Notes (Signed)
Patient's family agitated. Security called to bedside.

## 2017-09-15 NOTE — ED Notes (Signed)
Patriciaann Clan, PA, patient meets inpatient criteria. TTS to secure placement. Dr. Regenia Skeeter and Nila Nephew, RN, notified of disposition.

## 2017-09-15 NOTE — ED Notes (Signed)
Poison Control called stating the patient is on the way by EMS due to taking Prozac 20  20 mg tablets with intent to harm self. Time taken around 3pm. Pt is still alert, EMS on sceen.  Recommended observing 8-10 hours Seizures treat with Benzo's  Stomach upset treat with Zofran Cardiac monitor if needed. EKG ETOH  Acetaminophen level  Regular labs  Charcoal without sorbitol 1 gram per kg max 75 grams Dan with Poison control was contact person

## 2017-09-15 NOTE — ED Provider Notes (Signed)
[facility] DEPT Provider Note   CSN: [id number] Arrival date & time: 09/15/17  1554     History   Chief Complaint Chief Complaint  Patient presents with  . Suicide Attempt    HPI [name] is a 37 y.o. female.  HPI  37 year old female presents after an intentional fluoxetine overdose.  This occurred at around 3 PM.  She got into an argument with her husband.  She has been depressed and stressed recently.  After this argument, she felt like everyone will be better off if she were not around and so she took around 20 tablets of 20 mg fluoxetine.  Drink a Mike's hard lemonade as well.  Feels a little somnolent since this but denies headache, chest pain, shortness of breath, vomiting or abdominal pain.  Called EMS because she changed her mind and did not want to die.  She is interested in getting help.  Past Medical History:  Diagnosis Date  . Anxiety   . Cluster headaches   . Depression   . Eczema   . GERD (gastroesophageal reflux disease)   . Gestational diabetes 2012, 2016  . Hyperlipidemia   . Hypertension    no meds currently  . Sciatica   . Sleep apnea   . Snoring    sleep study 02/2012 with min AHI events    Patient Active Problem List   Diagnosis Date Noted  . Neck pain 08/14/2017  . Pre-diabetes 08/14/2017  . Nicotine dependence, cigarettes, uncomplicated 31/49/7026  . Fatigue 11/03/2015  . Allergic rhinitis 05/16/2015  . S/P C-section 12/18/2014  . Hyperlipidemia 12/16/2014  . Hx of herpes simplex infection 11/29/2014  . Major depressive disorder, recurrent, severe without psychotic features ([place]) 09/23/2014  . GAD (generalized anxiety disorder) 09/23/2014  . Social anxiety disorder 09/23/2014  . Routine general medical examination at a health care facility 01/07/2014  . OSA on CPAP 11/26/2011  . Morbid obesity ([place]) 05/17/2011  . Chronic pain of right knee 02/16/2011  . Depression 02/16/2011  . Hx gestational  diabetes 02/16/2011  . Eczema 02/16/2011    Past Surgical History:  Procedure Laterality Date  . CESAREAN SECTION  09 & 12   x's 2  . CESAREAN SECTION WITH BILATERAL TUBAL LIGATION Bilateral 12/18/2014   Procedure: REPEAT CESAREAN SECTION WITH BILATERAL TUBAL LIGATION;  Surgeon: Crawford Givens, MD;  Location: [facility];  Service: Obstetrics;  Laterality: Bilateral;  . COLPOSCOPY    . DENTAL SURGERY    . Ganglion removal from (L) wrist  1998  . LAPAROSCOPIC UNILATERAL SALPINGECTOMY Left 01/26/2015   Procedure: LAPAROSCOPIC UNILATERAL SALPINGECTOMY;  Surgeon: Crawford Givens, MD;  Location: [facility];  Service: Gynecology;  Laterality: Left;  . WISDOM TOOTH EXTRACTION       OB History    Gravida  3   Para  3   Term  1   Preterm      AB      Living  3     SAB      TAB      Ectopic      Multiple  0   Live Births  3            Home Medications    Prior to Admission medications   Medication Sig Start Date End Date Taking? Authorizing Provider  busPIRone (BUSPAR) 5 MG tablet Take 1 tablet (5 mg total) by mouth 2 (two) times daily. 07/18/17  Yes Charlcie Cradle, MD  cyclobenzaprine (FLEXERIL) 10 MG  tablet Take 1 tablet (10 mg total) by mouth 3 (three) times daily as needed for muscle spasms. 08/14/17  Yes Lance Sell, NP  FLUoxetine (PROZAC) 20 MG capsule Take 3 capsules (60 mg total) by mouth daily. 07/18/17 07/18/18 Yes Charlcie Cradle, MD  ibuprofen (ADVIL,MOTRIN) 600 MG tablet Take 1 tablet (600 mg total) by mouth every 8 (eight) hours as needed for moderate pain. 08/14/17  Yes Lance Sell, NP    Family History Family History  Problem Relation Age of Onset  . Arthritis Mother   . Alcohol abuse Mother   . Mental illness Mother   . Cancer Mother 31       squamous - unknown primary  . Coronary artery disease Mother 67       MI with stent  . Anxiety disorder Mother   . Depression Mother   . Arthritis Father   . Alcohol abuse Father   . Hyperlipidemia  Father   . Heart disease Father   . Kidney disease Father   . Stroke Father   . Mental illness Father   . Drug abuse Father   . Diabetes Maternal Grandmother   . Heart disease Maternal Grandmother   . Stroke Maternal Grandfather   . Hypertension Paternal Grandfather   . Hyperlipidemia Paternal Grandfather   . Arthritis Other   . Alcohol abuse Other   . Breast cancer Paternal Aunt   . Anxiety disorder Sister     Social History Social History   Tobacco Use  . Smoking status: Current Every Day Smoker    Packs/day: 0.30    Years: 15.00    Pack years: 4.50    Types: Cigarettes    Last attempt to quit: 06/24/2016    Years since quitting: 1.2  . Smokeless tobacco: Never Used  . Tobacco comment: Reports started back and trying ot quit again  Substance Use Topics  . Alcohol use: Yes    Comment: once a month or less  . Drug use: No     Allergies   Codeine and Doxycycline   Review of Systems Review of Systems  Respiratory: Negative for shortness of breath.   Cardiovascular: Negative for chest pain.  Gastrointestinal: Negative for abdominal pain, nausea and vomiting.  Neurological: Negative for headaches.  Psychiatric/Behavioral: Positive for suicidal ideas.  All other systems reviewed and are negative.    Physical Exam Updated Vital Signs BP 121/71   Pulse 90   Temp 98.7 F (37.1 C) (Oral)   Resp (!) 25   Ht 5' 2.5" (1.588 m)   Wt 133.8 kg   LMP 09/01/2017   SpO2 98%   BMI 53.10 kg/m   Physical Exam  Constitutional: She is oriented to person, place, and time. She appears well-developed and well-nourished. No distress.  HENT:  Head: Normocephalic and atraumatic.  Right Ear: External ear normal.  Left Ear: External ear normal.  Nose: Nose normal.  Eyes: Right eye exhibits no discharge. Left eye exhibits no discharge.  Cardiovascular: Normal rate, regular rhythm and normal heart sounds.  Pulmonary/Chest: Effort normal and breath sounds normal.  Abdominal:  Soft. She exhibits no distension. There is no tenderness.  Neurological: She is alert and oriented to person, place, and time.  Skin: Skin is warm and dry. She is not diaphoretic.  Psychiatric: She exhibits a depressed mood. She expresses no suicidal ideation.  Nursing note and vitals reviewed.    ED Treatments / Results  Labs (all labs ordered are listed, but only  abnormal results are displayed) Labs Reviewed  COMPREHENSIVE METABOLIC PANEL - Abnormal; Notable for the following components:      Result Value   Glucose, Bld 126 (*)    All other components within normal limits  ACETAMINOPHEN LEVEL - Abnormal; Notable for the following components:   Acetaminophen (Tylenol), Serum <10 (*)    All other components within normal limits  ETHANOL  RAPID URINE DRUG SCREEN, HOSP PERFORMED  PREGNANCY, URINE  SALICYLATE LEVEL  CBC WITH DIFFERENTIAL/PLATELET    EKG EKG Interpretation  Date/Time:  Sunday September 15 2017 15:57:26 EDT Ventricular Rate:  77 PR Interval:    QRS Duration: 97 QT Interval:  400 QTC Calculation: 453 R Axis:   5 Text Interpretation:  Normal sinus rhythm Low voltage, precordial leads no significant change since Oct 2018 Confirmed by Sherwood Gambler [phone]) on 09/15/2017 4:19:22 PM Also confirmed by Sherwood Gambler [phone]), editor Philomena Doheny [phone])  on 09/15/2017 4:43:48 PM   Radiology No results found.  Procedures Procedures (including critical care time)  Medications Ordered in ED Medications  charcoal activated (NO SORBITOL) (ACTIDOSE-AQUA) suspension 75 g (75 g Oral Given 09/15/17 1638)     Initial Impression / Assessment and Plan / ED Course  I have reviewed the triage vital signs and the nursing notes.  Pertinent labs & imaging results that were available during my care of the patient were reviewed by me and considered in my medical decision making (see chart for details).  Clinical Course as of Sep 15 2298  Nancy Fetter Sep 15, 2017  1614 Patient is  awake, alert, not somnolent or intoxicated. Poison control recommends charcoal given acute onset ~1 hour prior to help prevent possible complications. She is protecting airway and I think this is reasonable. Will need 7 pm tylenol.   [SG]    Clinical Course User Index [SG] Sherwood Gambler, MD    Patient has remained stable in the emergency department.  No obvious complications from her intentional overdose.  TTS has seen and currently psychiatry is recommending admission.  Final Clinical Impressions(s) / ED Diagnoses   Final diagnoses:  Intentional drug overdose, initial encounter [facility])    ED Discharge Orders    None       Sherwood Gambler, MD 09/15/17 2300

## 2017-09-15 NOTE — ED Notes (Signed)
Bed: RESA Expected date:  Expected time:  Means of arrival:  Comments: Overdose Prozac

## 2017-09-15 NOTE — ED Notes (Signed)
Pt stated that she has blood pressure medication that she takes, and she has not taken it today. RN notified of blood pressure.

## 2017-09-16 ENCOUNTER — Inpatient Hospital Stay (HOSPITAL_COMMUNITY)
Admission: AD | Admit: 2017-09-16 | Discharge: 2017-09-19 | DRG: 885 | Disposition: A | Discharge status: Home / Self Care | Payer: Federal, State, Local not specified - PPO | Source: Intra-hospital | Attending: Psychiatry | Admitting: Psychiatry

## 2017-09-16 ENCOUNTER — Encounter (HOSPITAL_COMMUNITY): Payer: Self-pay | Admitting: *Deleted

## 2017-09-16 ENCOUNTER — Encounter (HOSPITAL_COMMUNITY): Payer: Self-pay | Admitting: Psychology

## 2017-09-16 ENCOUNTER — Other Ambulatory Visit: Payer: Self-pay

## 2017-09-16 ENCOUNTER — Ambulatory Visit (HOSPITAL_COMMUNITY): Payer: Self-pay | Admitting: Psychology

## 2017-09-16 DIAGNOSIS — Z8261 Family history of arthritis: Secondary | ICD-10-CM

## 2017-09-16 DIAGNOSIS — Z9851 Tubal ligation status: Secondary | ICD-10-CM

## 2017-09-16 DIAGNOSIS — K219 Gastro-esophageal reflux disease without esophagitis: Secondary | ICD-10-CM | POA: Diagnosis present

## 2017-09-16 DIAGNOSIS — G4733 Obstructive sleep apnea (adult) (pediatric): Secondary | ICD-10-CM | POA: Diagnosis not present

## 2017-09-16 DIAGNOSIS — R45851 Suicidal ideations: Secondary | ICD-10-CM | POA: Diagnosis present

## 2017-09-16 DIAGNOSIS — Z818 Family history of other mental and behavioral disorders: Secondary | ICD-10-CM | POA: Diagnosis not present

## 2017-09-16 DIAGNOSIS — G4700 Insomnia, unspecified: Secondary | ICD-10-CM | POA: Diagnosis not present

## 2017-09-16 DIAGNOSIS — Z803 Family history of malignant neoplasm of breast: Secondary | ICD-10-CM

## 2017-09-16 DIAGNOSIS — Z6841 Body Mass Index (BMI) 40.0 and over, adult: Secondary | ICD-10-CM

## 2017-09-16 DIAGNOSIS — Z8249 Family history of ischemic heart disease and other diseases of the circulatory system: Secondary | ICD-10-CM

## 2017-09-16 DIAGNOSIS — F332 Major depressive disorder, recurrent severe without psychotic features: Principal | ICD-10-CM | POA: Diagnosis present

## 2017-09-16 DIAGNOSIS — T43222A Poisoning by selective serotonin reuptake inhibitors, intentional self-harm, initial encounter: Secondary | ICD-10-CM

## 2017-09-16 DIAGNOSIS — Z79899 Other long term (current) drug therapy: Secondary | ICD-10-CM

## 2017-09-16 DIAGNOSIS — F411 Generalized anxiety disorder: Secondary | ICD-10-CM | POA: Diagnosis not present

## 2017-09-16 DIAGNOSIS — Z811 Family history of alcohol abuse and dependence: Secondary | ICD-10-CM

## 2017-09-16 DIAGNOSIS — Z833 Family history of diabetes mellitus: Secondary | ICD-10-CM

## 2017-09-16 DIAGNOSIS — Z599 Problem related to housing and economic circumstances, unspecified: Secondary | ICD-10-CM

## 2017-09-16 DIAGNOSIS — Z823 Family history of stroke: Secondary | ICD-10-CM

## 2017-09-16 DIAGNOSIS — R7303 Prediabetes: Secondary | ICD-10-CM | POA: Diagnosis not present

## 2017-09-16 DIAGNOSIS — F17210 Nicotine dependence, cigarettes, uncomplicated: Secondary | ICD-10-CM

## 2017-09-16 DIAGNOSIS — E785 Hyperlipidemia, unspecified: Secondary | ICD-10-CM | POA: Diagnosis present

## 2017-09-16 DIAGNOSIS — Z881 Allergy status to other antibiotic agents status: Secondary | ICD-10-CM

## 2017-09-16 DIAGNOSIS — Z8632 Personal history of gestational diabetes: Secondary | ICD-10-CM

## 2017-09-16 DIAGNOSIS — Z885 Allergy status to narcotic agent status: Secondary | ICD-10-CM

## 2017-09-16 DIAGNOSIS — I10 Essential (primary) hypertension: Secondary | ICD-10-CM | POA: Diagnosis present

## 2017-09-16 DIAGNOSIS — T1491XA Suicide attempt, initial encounter: Secondary | ICD-10-CM

## 2017-09-16 DIAGNOSIS — F4010 Social phobia, unspecified: Secondary | ICD-10-CM | POA: Diagnosis not present

## 2017-09-16 DIAGNOSIS — Z9989 Dependence on other enabling machines and devices: Secondary | ICD-10-CM | POA: Diagnosis not present

## 2017-09-16 DIAGNOSIS — F322 Major depressive disorder, single episode, severe without psychotic features: Secondary | ICD-10-CM | POA: Diagnosis not present

## 2017-09-16 DIAGNOSIS — Z915 Personal history of self-harm: Secondary | ICD-10-CM

## 2017-09-16 DIAGNOSIS — G8929 Other chronic pain: Secondary | ICD-10-CM | POA: Diagnosis present

## 2017-09-16 DIAGNOSIS — Z813 Family history of other psychoactive substance abuse and dependence: Secondary | ICD-10-CM

## 2017-09-16 DIAGNOSIS — Z841 Family history of disorders of kidney and ureter: Secondary | ICD-10-CM

## 2017-09-16 DIAGNOSIS — Z9889 Other specified postprocedural states: Secondary | ICD-10-CM | POA: Diagnosis not present

## 2017-09-16 DIAGNOSIS — G44009 Cluster headache syndrome, unspecified, not intractable: Secondary | ICD-10-CM | POA: Diagnosis not present

## 2017-09-16 DIAGNOSIS — Z8349 Family history of other endocrine, nutritional and metabolic diseases: Secondary | ICD-10-CM

## 2017-09-16 DIAGNOSIS — Z8619 Personal history of other infectious and parasitic diseases: Secondary | ICD-10-CM

## 2017-09-16 DIAGNOSIS — Z791 Long term (current) use of non-steroidal anti-inflammatories (NSAID): Secondary | ICD-10-CM

## 2017-09-16 MED ORDER — MAGNESIUM HYDROXIDE 400 MG/5ML PO SUSP
30.0000 mL | Freq: Every day | ORAL | Status: DC | PRN
  Administered 2017-09-18: 30 mL via ORAL
  Filled 2017-09-16: qty 30

## 2017-09-16 MED ORDER — NICOTINE 21 MG/24HR TD PT24
21.0000 mg | MEDICATED_PATCH | Freq: Every day | TRANSDERMAL | Status: DC
  Filled 2017-09-16 (×2): qty 1

## 2017-09-16 MED ORDER — ALUM & MAG HYDROXIDE-SIMETH 200-200-20 MG/5ML PO SUSP: 30.0000 mL | ORAL | Status: DC | PRN

## 2017-09-16 MED ORDER — ONDANSETRON HCL 4 MG PO TABS: 4.0000 mg | ORAL_TABLET | Freq: Three times a day (TID) | ORAL | Status: DC | PRN

## 2017-09-16 MED ORDER — ACETAMINOPHEN 325 MG PO TABS: 650.0000 mg | ORAL_TABLET | Freq: Four times a day (QID) | ORAL | Status: DC | PRN

## 2017-09-16 MED ORDER — IBUPROFEN 200 MG PO TABS
400.0000 mg | ORAL_TABLET | Freq: Once | ORAL | Status: AC
  Administered 2017-09-16: 400 mg via ORAL
  Filled 2017-09-16: qty 2

## 2017-09-16 NOTE — Consult Note (Addendum)
[facility] Consult   Reason for Consult:  Suicide attempt Referring Physician:  EDP Patient Identification: [name] MRN:  [id number] Principal Diagnosis: Major depressive disorder, recurrent, severe without psychotic features ([place]) Diagnosis:   Patient Active Problem List   Diagnosis Date Noted  . Major depressive disorder, recurrent, severe without psychotic features ([place]) [F33.2] 09/23/2014    Priority: High  . Neck pain [M54.2] 08/14/2017  . Pre-diabetes [R73.03] 08/14/2017  . Nicotine dependence, cigarettes, uncomplicated [F64.332] 95/18/8416  . Fatigue [R53.83] 11/03/2015  . Allergic rhinitis [J30.9] 05/16/2015  . S/P C-section [S06.301] 12/18/2014  . Hyperlipidemia [E78.5] 12/16/2014  . 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 GAD (generalized anxiety disorder) [F41.1] 09/23/2014  . Social anxiety disorder [F40.10] 09/23/2014  . Routine general medical examination at a health care facility [Z00.00] 01/07/2014  . OSA on CPAP [G47.33, Z99.89] 11/26/2011  . Morbid obesity ([place]) [E66.01] 05/17/2011  . Chronic pain of right knee [M25.561, G89.29] 02/16/2011  . Depression [F32.9] 02/16/2011  . Hx gestational diabetes [Z86.32] 02/16/2011  . Eczema [L30.9] 02/16/2011    Total Time spent with patient: 45 minutes  Subjective:   [name] is a 37 y.o. female patient admitted with intentional overdose.  HPI:  37 yo female who came to the ED after overdosing in a suicide attempt by taking 20 fluoxetine and not being able to vomit at that time.  Today, she minimizes her attempt but does state her financial stressors are increasing her depression and medications/therapy decreases them.  5/10 depression with anxiety at 3/10.  No hallucinations or homicidal ideations or substance abuse.  Past Psychiatric History: depression  Risk to Self: Suicidal Ideation: Yes-Currently Present Suicidal Intent: Yes-Currently Present Is patient at risk for  suicide?: Yes Suicidal Plan?: Yes-Currently Present Specify Current Suicidal Plan: (attempted overdose on 20 pills) Access to Means: Yes Specify Access to Suicidal Means: (medication pills in the home) What has been your use of drugs/alcohol within the last 12 months?: (drink occasionally) How many times?: (denied) Other Self Harm Risks: (denied) Triggers for Past Attempts: None known Intentional Self Injurious Behavior: None Risk to Others: Homicidal Ideation: No Thoughts of Harm to Others: No Current Homicidal Intent: No Current Homicidal Plan: No Access to Homicidal Means: No Identified Victim: (n/a) History of harm to others?: No Assessment of Violence: None Noted Violent Behavior Description: (n/a) Does patient have access to weapons?: No Criminal Charges Pending?: No Does patient have a court date: No Prior Inpatient Therapy: Prior Inpatient Therapy: No Prior Outpatient Therapy: Prior Outpatient Therapy: No Does patient have an ACCT team?: No Does patient have Intensive In-House Services?  : No Does patient have [facility] services? : No Does patient have P4CC services?: No  Past Medical History:  Past Medical History:  Diagnosis Date  . Anxiety   . Cluster headaches   . Depression   . Eczema   . GERD (gastroesophageal reflux disease)   . Gestational diabetes 2012, 2016  . Hyperlipidemia   . Hypertension    no meds currently  . Sciatica   . Sleep apnea   . Snoring    sleep study 02/2012 with min AHI events    Past Surgical History:  Procedure Laterality Date  . CESAREAN SECTION  09 & 12   x's 2  . CESAREAN SECTION WITH BILATERAL TUBAL LIGATION Bilateral 12/18/2014   Procedure: REPEAT CESAREAN SECTION WITH BILATERAL TUBAL LIGATION;  Surgeon: Crawford Givens, MD;  Location: [facility];  Service: Obstetrics;  Laterality:  Bilateral;  . COLPOSCOPY    . DENTAL SURGERY    . Ganglion removal from (L) wrist  1998  . LAPAROSCOPIC UNILATERAL SALPINGECTOMY Left 01/26/2015    Procedure: LAPAROSCOPIC UNILATERAL SALPINGECTOMY;  Surgeon: Crawford Givens, MD;  Location: [facility];  Service: Gynecology;  Laterality: Left;  . WISDOM TOOTH EXTRACTION     Family History:  Family History  Problem Relation Age of Onset  . Arthritis Mother   . Alcohol abuse Mother   . Mental illness Mother   . Cancer Mother 56       squamous - unknown primary  . Coronary artery disease Mother 60       MI with stent  . Anxiety disorder Mother   . Depression Mother   . Arthritis Father   . Alcohol abuse Father   . Hyperlipidemia Father   . Heart disease Father   . Kidney disease Father   . Stroke Father   . Mental illness Father   . Drug abuse Father   . Diabetes Maternal Grandmother   . Heart disease Maternal Grandmother   . Stroke Maternal Grandfather   . Hypertension Paternal Grandfather   . Hyperlipidemia Paternal Grandfather   . Arthritis Other   . Alcohol abuse Other   . Breast cancer Paternal Aunt   . Anxiety disorder Sister    Family Psychiatric  History: see above Social History:  Social History   Substance and Sexual Activity  Alcohol Use Yes   Comment: once a month or less     Social History   Substance and Sexual Activity  Drug Use No    Social History   Socioeconomic History  . Marital status: Married    Spouse name: Not on file  . Number of children: 3  . Years of education: 38  . Highest education level: Not on file  Occupational History  . Occupation: Group Transport planner  . Financial resource strain: Not on file  . Food insecurity:    Worry: Not on file    Inability: Not on file  . Transportation needs:    Medical: Not on file    Non-medical: Not on file  Tobacco Use  . Smoking status: Current Every Day Smoker    Packs/day: 0.30    Years: 15.00    Pack years: 4.50    Types: Cigarettes    Last attempt to quit: 06/24/2016    Years since quitting: 1.2  . Smokeless tobacco: Never Used  . Tobacco comment: Reports started back and  trying ot quit again  Substance and Sexual Activity  . Alcohol use: Yes    Comment: once a month or less  . Drug use: No  . Sexual activity: Yes    Partners: Male    Birth control/protection: None  Lifestyle  . Physical activity:    Days per week: Not on file    Minutes per session: Not on file  . Stress: Not on file  Relationships  . Social connections:    Talks on phone: Not on file    Gets together: Not on file    Attends religious service: Not on file    Active member of club or organization: Not on file    Attends meetings of clubs or organizations: Not on file    Relationship status: Not on file  Other Topics Concern  . Not on file  Social History Narrative   Secretary   Married, lives with spouse and 2 kids  Moved to [place] summer 2011 to be near mom - originally from [place]   Denies abuse and feels safe at home.   Additional Social History:  None    Allergies:   Allergies  Allergen Reactions  . Codeine     STOMACH CRAMPING  . Doxycycline Nausea And Vomiting    Labs:  Results for orders placed or performed during the hospital encounter of 09/15/17 (from the past 48 hour(s))  Comprehensive metabolic panel     Status: Abnormal   Collection Time: 09/15/17  4:12 PM  Result Value Ref Range   Sodium 138 135 - 145 mmol/L   Potassium 4.1 3.5 - 5.1 mmol/L   Chloride 105 98 - 111 mmol/L   CO2 27 22 - 32 mmol/L   Glucose, Bld 126 (H) 70 - 99 mg/dL   BUN 10 6 - 20 mg/dL   Creatinine, Ser 0.93 0.44 - 1.00 mg/dL   Calcium 9.0 8.9 - 10.3 mg/dL   Total Protein 7.0 6.5 - 8.1 g/dL   Albumin 3.9 3.5 - 5.0 g/dL   AST 16 15 - 41 U/L   ALT 19 0 - 44 U/L   Alkaline Phosphatase 79 38 - 126 U/L   Total Bilirubin 0.5 0.3 - 1.2 mg/dL   GFR calc non Af Amer >60 >60 mL/min   GFR calc Af Amer >60 >60 mL/min    Comment: (NOTE) The eGFR has been calculated using the CKD EPI equation. This calculation has not been validated in all clinical situations. eGFR's persistently <60 mL/min  signify possible Chronic Kidney Disease.    Anion gap 6 5 - 15    Comment: Performed at [facility], [place] [address], [place], [place] [address]  Ethanol     Status: None   Collection Time: 09/15/17  4:12 PM  Result Value Ref Range   Alcohol, Ethyl (B) <10 <10 mg/dL    Comment: (NOTE) Lowest detectable limit for serum alcohol is 10 mg/dL. For medical purposes only. Performed at [facility], [place] [address], [place], [place] [address]   Urine rapid drug screen (hosp performed)     Status: None   Collection Time: 09/15/17  4:12 PM  Result Value Ref Range   Opiates NONE DETECTED NONE DETECTED   Cocaine NONE DETECTED NONE DETECTED   Benzodiazepines NONE DETECTED NONE DETECTED   Amphetamines NONE DETECTED NONE DETECTED   Tetrahydrocannabinol NONE DETECTED NONE DETECTED   Barbiturates NONE DETECTED NONE DETECTED    Comment: (NOTE) DRUG SCREEN FOR MEDICAL PURPOSES ONLY.  IF CONFIRMATION IS NEEDED FOR ANY PURPOSE, NOTIFY LAB WITHIN 5 DAYS. LOWEST DETECTABLE LIMITS FOR URINE DRUG SCREEN Drug Class                     Cutoff (ng/mL) Amphetamine and metabolites    1000 Barbiturate and metabolites    200 Benzodiazepine                 606 Tricyclics and metabolites     300 Opiates and metabolites        300 Cocaine and metabolites        300 THC                            50 Performed at [facility], [place] [address], [place], [place] [address]   Pregnancy, urine     Status: None   Collection Time: 09/15/17  4:12 PM  Result Value Ref Range   Preg Test, Ur NEGATIVE NEGATIVE    Comment:        THE SENSITIVITY OF THIS METHODOLOGY IS >20 mIU/mL. Performed at [facility], [place] [address] [place], [place] [address]   Salicylate level     Status: None   Collection Time: 09/15/17  4:12 PM  Result Value Ref Range   Salicylate Lvl <7.0 2.8 - 30.0 mg/dL    Comment: Performed at [facility],  [place] [address], [place], [place] [address]  CBC with Differential     Status: None   Collection Time: 09/15/17  4:12 PM  Result Value Ref Range   WBC 7.8 4.0 - 10.5 K/uL   RBC 4.44 3.87 - 5.11 MIL/uL   Hemoglobin 13.0 12.0 - 15.0 g/dL   HCT 39.7 36.0 - 46.0 %   MCV 89.4 78.0 - 100.0 fL   MCH 29.3 26.0 - 34.0 pg   MCHC 32.7 30.0 - 36.0 g/dL   RDW 13.7 11.5 - 15.5 %   Platelets 288 150 - 400 K/uL   Neutrophils Relative % 77 %   Neutro Abs 6.0 1.7 - 7.7 K/uL   Lymphocytes Relative 19 %   Lymphs Abs 1.5 0.7 - 4.0 K/uL   Monocytes Relative 3 %   Monocytes Absolute 0.3 0.1 - 1.0 K/uL   Eosinophils Relative 1 %   Eosinophils Absolute 0.1 0.0 - 0.7 K/uL   Basophils Relative 0 %   Basophils Absolute 0.0 0.0 - 0.1 K/uL    Comment: Performed at [facility], [place] [address], [place], [place] [address]  Acetaminophen level     Status: Abnormal   Collection Time: 09/15/17  7:00 PM  Result Value Ref Range   Acetaminophen (Tylenol), Serum <10 (L) 10 - 30 ug/mL    Comment: (NOTE) Therapeutic concentrations vary significantly. A range of 10-30 ug/mL  may be an effective concentration for many patients. However, some  are best treated at concentrations outside of this range. Acetaminophen concentrations >150 ug/mL at 4 hours after ingestion  and >50 ug/mL at 12 hours after ingestion are often associated with  toxic reactions. Performed at [facility], [place] [address], [place], [place] [address]     Current Facility-Administered Medications  Medication Dose Route Frequency Provider Last Rate Last Dose  . nicotine (NICODERM CQ - dosed in mg/24 hours) patch 21 mg  21 mg Transdermal Daily Sherwood Gambler, MD      . ondansetron [facility]) tablet 4 mg  4 mg Oral Q8H PRN Sherwood Gambler, MD       Current Outpatient Medications  Medication Sig Dispense Refill  . busPIRone (BUSPAR) 5 MG tablet Take 1 tablet (5 mg total) by mouth 2 (two) times daily. 180 tablet  0  . cyclobenzaprine (FLEXERIL) 10 MG tablet Take 1 tablet (10 mg total) by mouth 3 (three) times daily as needed for muscle spasms. 30 tablet 0  . FLUoxetine (PROZAC) 20 MG capsule Take 3 capsules (60 mg total) by mouth daily. 270 capsule 0  . ibuprofen (ADVIL,MOTRIN) 600 MG tablet Take 1 tablet (600 mg total) by mouth every 8 (eight) hours as needed for moderate pain. 90 tablet 0    Musculoskeletal: Strength & Muscle Tone: within normal limits Gait & Station: normal Patient leans: N/A  Psychiatric Specialty Exam: Physical Exam  Nursing note and vitals reviewed. Constitutional: She is oriented to person, place, and time. She appears well-developed  and well-nourished.  HENT:  Head: Normocephalic and atraumatic.  Neck: Normal range of motion.  Respiratory: Effort normal.  Musculoskeletal: Normal range of motion.  Neurological: She is alert and oriented to person, place, and time.  Psychiatric: Her speech is normal and behavior is normal. Judgment and thought content normal. Her mood appears anxious. Cognition and memory are normal. She exhibits a depressed mood.    Review of Systems  Psychiatric/Behavioral: Positive for depression and suicidal ideas. The patient is nervous/anxious.   All other systems reviewed and are negative.   Blood pressure 130/85, pulse 76, temperature 98.4 F (36.9 C), temperature source Oral, resp. rate 20, height 5' 2.5" (1.588 m), weight 133.8 kg, last menstrual period 09/01/2017, SpO2 95 %, unknown if currently breastfeeding.Body mass index is 53.1 kg/m.  General Appearance: Casual  Eye Contact:  Good  Speech:  Normal Rate  Volume:  Decreased  Mood:  Anxious and Depressed  Affect:  Congruent  Thought Process:  Coherent and Descriptions of Associations: Intact  Orientation:  Full (Time, Place, and Person)  Thought Content:  Rumination  Suicidal Thoughts:  Yes.  with intent/plan  Homicidal Thoughts:  No  Memory:  Immediate;   Fair Recent;    Fair Remote;   Fair  Judgement:  Poor  Insight:  Lacking  Psychomotor Activity:  Decreased  Concentration:  Concentration: Fair and Attention Span: Fair  Recall:  [employer] of Knowledge:  Fair  Language:  Good  Akathisia:  No  Handed:  Right  AIMS (if indicated):   N/A  Assets:  Housing Leisure Time Physical Health Resilience Social Support  ADL's:  Intact  Cognition:  WNL  Sleep:   N/A     Treatment Plan Summary: Daily contact with patient to assess and evaluate symptoms and progress in treatment, Medication management and Plan major depressive disorder, recurrent, severe without psychosis:  -Medications not started due to overdose and needing to clear  Disposition: Recommend psychiatric Inpatient admission when medically cleared.  Waylan Boga, NP 09/16/2017 1:27 PM

## 2017-09-16 NOTE — Progress Notes (Signed)
[name] is a 37 y.o. female patient who didn't show for her appointment. Husband spoke to counselor to inform that pt is in the ED at [facility] after taking pills to overdose after they had a verbal conflict. He reports that she is being admitted to [facility] currently awaiting a bed.Marland Kitchen        [name], LPC

## 2017-09-16 NOTE — ED Notes (Signed)
Pt ambulatory w/o difficulty to [facility] with pehlam, belongings given to driver.

## 2017-09-16 NOTE — Progress Notes (Signed)
[name] is a 37 year old female pt admitted on voluntary basis. On admission, she does endorse that she took the overdose and spoke about how she immediately regretted it. She spoke about how she tried to make herself vomit but couldn't and then she called 911. She spoke about how she has a lot of stuff going on, and spoke about financial issues as well as issues with her father who recently relapsed. She reports that she takes her medications as prescribed and reports she occasionally forgets on the weekends and reports that she feels her medications are working fine. She denies any substance abuse issues. She reports that she lives with her husband and children and mother-in-law and reports that she will go back there once she is discharged. [name] was escorted to the unit, oriented to the milieu and safety maintained.

## 2017-09-16 NOTE — ED Notes (Signed)
Pt ambulatory w/o difficulty to room 41

## 2017-09-16 NOTE — ED Notes (Signed)
Pt reports that she and her husband got into an argument last night and she said some things she regretted saying and thought that " they would be better off with out me."  Pt reports that after she took the pills she regretted it and tried to make herself throw up but was not able to and called EMS.  Pt denies any previous SA, but did have thoughts of harming herself several weeks ago, but did not act on them.  Pt reports history of depression and goes to [facility].  Pt reports no recent med changes.  Pt reports that this may have been triggered because her father is a drug addict and recently relapsed.  She report that he asked to come stay with them but she declined because of her children.  Pt reports feeling guilty about that decision.

## 2017-09-16 NOTE — BH Assessment (Addendum)
[facility] Assessment Progress Note  Per Buford Dresser, DO, this pt requires psychiatric hospitalization at this time.  Leonia Reader, RN, [facility] has assigned pt to [facility] Rm [id number]; Dimple Nanas will call when [facility] is ready to receive pt.  Pt has signed Voluntary Admission and Consent for Treatment, as well as Consent to Release Information to her husband and to Dr Doyne Keel and Legrand Pitts at the [facility], and a notification call has been placed to the providers.  Signed forms have been faxed to [facility].  Pt's nurse, Narda Rutherford, has been notified, and agrees to send original paperwork along with pt via [facility], and to call report to [phone] when the time comes.  Jalene Mullet, [place] Behavioral Health Coordinator [phone]   Addendum:  Dimple Nanas calls to report that [facility] will be ready to receive pt in about 45 minutes.  Pt's nurse, Narda Rutherford, has been informed.  Jalene Mullet, [place] Coordinator [phone]

## 2017-09-16 NOTE — ED Notes (Addendum)
Pt's husband at bedside.  Admission procedure discussed with both.  Pt and husband verbalize understanding.  Husband very supportive of wide's admission to [facility].  Room information given.  Pt's husband reports that 9/7 was the aniversary of his sisters death-she completed suicide.

## 2017-09-16 NOTE — Tx Team (Signed)
Initial Treatment Plan 09/16/2017 4:56 PM [place] Jane Canary [id number]    PATIENT STRESSORS: Financial difficulties Marital or family conflict   PATIENT STRENGTHS: Ability for insight Average or above average intelligence Capable of independent living General fund of knowledge Supportive family/friends   PATIENT IDENTIFIED PROBLEMS: Depression Suicidal behaviors "I have a lot of stuff going on"                     DISCHARGE CRITERIA:  Ability to meet basic life and health needs Improved stabilization in mood, thinking, and/or behavior Reduction of life-threatening or endangering symptoms to within safe limits Verbal commitment to aftercare and medication compliance  PRELIMINARY DISCHARGE PLAN: Attend aftercare/continuing care group Return to previous living arrangement  PATIENT/FAMILY INVOLVEMENT: This treatment plan has been presented to and reviewed with the patient, [name], and/or family member, .  The patient and family have been given the opportunity to ask questions and make suggestions.  [place], [place], [place] 09/16/2017, 4:56 PM

## 2017-09-16 NOTE — ED Notes (Signed)
Report called to [facility] RN--may transport in 45 mins

## 2017-09-16 NOTE — ED Notes (Signed)
[facility] will call back for report

## 2017-09-16 NOTE — ED Notes (Signed)
On the phone 

## 2017-09-16 NOTE — ED Notes (Signed)
Bed: [facility] Expected date:  Expected time:  Means of arrival:  Comments: Room 18

## 2017-09-17 DIAGNOSIS — F332 Major depressive disorder, recurrent severe without psychotic features: Principal | ICD-10-CM

## 2017-09-17 MED ORDER — BUSPIRONE HCL 10 MG PO TABS
10.0000 mg | ORAL_TABLET | Freq: Two times a day (BID) | ORAL | Status: DC
  Administered 2017-09-17 – 2017-09-18 (×3): 10 mg via ORAL
  Filled 2017-09-17 (×3): qty 1
  Filled 2017-09-17: qty 2
  Filled 2017-09-17 (×2): qty 1

## 2017-09-17 MED ORDER — NICOTINE 14 MG/24HR TD PT24
14.0000 mg | MEDICATED_PATCH | Freq: Every day | TRANSDERMAL | Status: DC
  Filled 2017-09-17 (×3): qty 1

## 2017-09-17 MED ORDER — FLUOXETINE HCL 20 MG PO CAPS
20.0000 mg | ORAL_CAPSULE | Freq: Every day | ORAL | Status: DC
  Administered 2017-09-17 – 2017-09-19 (×3): 20 mg via ORAL
  Filled 2017-09-17 (×6): qty 1

## 2017-09-17 NOTE — Progress Notes (Signed)
Adult Psychoeducational Group Note  Date:  09/17/2017 Time:  2:45 AM  Group Topic/Focus:  Wrap-Up Group:   The focus of this group is to help patients review their daily goal of treatment and discuss progress on daily workbooks.  Participation Level:  Active  Participation Quality:  Appropriate  Affect:  Appropriate  Cognitive:  Appropriate  Insight: Good  Engagement in Group:  Engaged  Modes of Intervention:  Discussion  Additional Comments:  Patient goal was to try to understand her stay at the hospital. Patient has accomplshied her goal and discuss her concerns with MD.   Sande Rives H 09/17/2017, 2:45 AM

## 2017-09-17 NOTE — BHH Counselor (Signed)
Adult Comprehensive Assessment  Patient ID: [name], female   DOB: 1980/09/01, 37 y.o.   MRN: [id number]  Information Source: Information source: Patient  Current Stressors:  Patient states their primary concerns and needs for treatment are:: "the stress piles up, suicidal thoughts"  Patient states their goals for this hospitilization and ongoing recovery are:: "Coping skills"  Educational / Learning stressors: Patient denies any stressors  Employment / Job issues: Employed; Patient reports she does not make enough money  Family Relationships: Patient reports that when she and her husband argue, "it goes to far sometimes, with what we say". She states that she currently has a strained relationship with her siblings, however she is working on salvaging the relationships. She also reports her father recently relapsed on drugs. States she did not let him move back in with her and she feels guilty about that.  Financial / Lack of resources (include bankruptcy): Limited income; Patient reports financial strain is her main stressor currently.  Housing / Lack of housing: Patient reports she struggles paying her rent.  Physical health (include injuries & life threatening diseases): Patient denies any stressors  Social relationships: Patient denies any stressors  Substance abuse: Patient reports she drinks alcohol "socially"; Denies frequent use.  Bereavement / Loss: Patient denies any stressors   Living/Environment/Situation:  Living Arrangements: Spouse/significant other, Children, Other relatives Living conditions (as described by patient or guardian): "Good" Who else lives in the home?: Children, Spouse and mother in-law.  How long has patient lived in current situation?: 6 years  What is atmosphere in current home: Chaotic, Supportive, Comfortable  Family History:  Marital status: Married Number of Years Married: 7 What types of issues is patient dealing with in the relationship?:  Finances Additional relationship information: Patient reports her husband suffers from mental illness.  Are you sexually active?: Yes What is your sexual orientation?: Heterosexual  Has your sexual activity been affected by drugs, alcohol, medication, or emotional stress?: No  Does patient have children?: Yes How many children?: 3 How is patient's relationship with their children?: Patient reports having a good relationship with her three children  Childhood History:  By whom was/is the patient raised?: Mother Additional childhood history information: Patient reports her father was a "drug addict" 69 of her childhood Description of patient's relationship with caregiver when they were a child: Patient reports she "idolized" her mother growing up; Reports having a strained relationship with her father due to his drug addiction  Patient's description of current relationship with people who raised him/her: Patient reports her mother is currently deceased; Patient reports having a strained relationship with her father due to his most recent relapse.  How were you disciplined when you got in trouble as a child/adolescent?: Spankings and punishment Does patient have siblings?: Yes Number of Siblings: 2 Description of patient's current relationship with siblings: Patient reports she is currently "mending" her relationships with her sister and brother Did patient suffer any verbal/emotional/physical/sexual abuse as a child?: No Did patient suffer from severe childhood neglect?: No Has patient ever been sexually abused/assaulted/raped as an adolescent or adult?: No Was the patient ever a victim of a crime or a disaster?: No Witnessed domestic violence?: No Has patient been effected by domestic violence as an adult?: No  Education:  Highest grade of school patient has completed: Water quality scientist degree Currently a student?: No Learning disability?: No  Employment/Work Situation:   Employment  situation: Employed 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, medical  How long has  patient been employed?: 10 years  Patient's job has been impacted by current illness: No What is the longest time patient has a held a job?: 10 years  Where was the patient employed at that time?: Current job  Did You Receive Any Psychiatric Treatment/Services While in Passenger transport manager?: No Are There Guns or Other Weapons in [place]?: No  Financial Resources:   Financial resources: Income from employment, Income from spouse, Private insurance Does patient have a representative payee or guardian?: No  Alcohol/Substance Abuse:   What has been your use of drugs/alcohol within the last 12 months?: Patient denies  If attempted suicide, did drugs/alcohol play a role in this?: No Alcohol/Substance Abuse Treatment Hx: Denies past history Has alcohol/substance abuse ever caused legal problems?: No  Social Support System:   Pensions consultant Support System: Good Describe Community Support System: "My husband, my children and my sister sometimes" Type of faith/religion: Spiritual  How does patient's faith help to cope with current illness?: Did not disclose   Leisure/Recreation:   Leisure and Hobbies: "I like audio books, reading, and art work"  Strengths/Needs:   What is the patient's perception of their strengths?: "Organized, follow through with my word and I'm not afraid to ask for help" Patient states they can use these personal strengths during their treatment to contribute to their recovery: Yes  Patient states these barriers may affect/interfere with their treatment: No  Patient states these barriers may affect their return to the community: No  Other important information patient would like considered in planning for their treatment: N/A   Discharge Plan:   Currently receiving community mental health services: Yes (From Whom)(Dr. Surveyor, minerals at [facility]) Patient states  concerns and preferences for aftercare planning are: Continue with current outpatient provider  Patient states they will know when they are safe and ready for discharge when: Yes  Does patient have access to transportation?: Yes Does patient have financial barriers related to discharge medications?: Yes Patient description of barriers related to discharge medications: Strained/limited income Will patient be returning to same living situation after discharge?: Yes  Summary/Recommendations:   Summary and Recommendations (to be completed by the evaluator): [name] is a 37 year old female who is diagnosed Major depressive disorder, recurrent, severe and has a  history of Anxiety disorder. She presented to the hospital seeking treatment for an attempted overdose. [name] was pleasant and cooperative with providing information. [name] reports that she attempted to overdose due to being ovewhelmed with many life stressors. [name] reports she would like to learn better coping skills to better manage her stress while in the hospital. [name] reports she currently sees [name] for outpatient medication management and [name] for therapy through [facility]. Quanika can benefit from crisis stabilization, medication management, therapeutic milieu and referral services.   [name]. 09/17/2017

## 2017-09-17 NOTE — Progress Notes (Signed)
Adult Psychoeducational Group Note  Date:  09/17/2017 Time:  6:41 PM  Group Topic/Focus:  Goals Group:   The focus of this group is to help patients establish daily goals to achieve during treatment and discuss how the patient can incorporate goal setting into their daily lives to aide in recovery.  Participation Level:  Active  Participation Quality:  Appropriate  Affect:  Appropriate  Cognitive:  Alert  Insight: Appropriate  Engagement in Group:  Engaged  Modes of Intervention:  Discussion  Additional Comments:  Pt attended group and participated in discussion.  Porsche Noguchi R Promiss Labarbera 09/17/2017, 6:41 PM

## 2017-09-17 NOTE — BHH Suicide Risk Assessment (Signed)
[facility] Admission Suicide Risk Assessment   Nursing information obtained from:  Patient Demographic factors:  Low socioeconomic status Current Mental Status:  Self-harm behaviors Loss Factors:  Financial problems / change in socioeconomic status Historical Factors:  Prior suicide attempts, Family history of mental illness or substance abuse Risk Reduction Factors:  Responsible for children under 38 years of age, Living with another person, especially a relative, Positive coping skills or problem solving skills  Total Time spent with patient: 30 minutes Principal Problem: <principal problem not specified> Diagnosis:   Patient Active Problem List   Diagnosis Date Noted  . Major depressive disorder, recurrent severe without psychotic features ([place]) [F33.2] 09/16/2017  . Neck pain [M54.2] 08/14/2017  . Pre-diabetes [R73.03] 08/14/2017  . Nicotine dependence, cigarettes, uncomplicated [D17.616] 07/37/1062  . Fatigue [R53.83] 11/03/2015  . Allergic rhinitis [J30.9] 05/16/2015  . S/P C-section [I94.854] 12/18/2014  . Hyperlipidemia [E78.5] 12/16/2014  . 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 Major depressive disorder, recurrent, severe without psychotic features ([place]) [F33.2] 09/23/2014  . GAD (generalized anxiety disorder) [F41.1] 09/23/2014  . Social anxiety disorder [F40.10] 09/23/2014  . Routine general medical examination at a health care facility [Z00.00] 01/07/2014  . OSA on CPAP [G47.33, Z99.89] 11/26/2011  . Morbid obesity ([place]) [E66.01] 05/17/2011  . Chronic pain of right knee [M25.561, G89.29] 02/16/2011  . Depression [F32.9] 02/16/2011  . Hx gestational diabetes [Z86.32] 02/16/2011  . Eczema [L30.9] 02/16/2011   Subjective Data: Patient is seen and examined.  Patient is a 37 year old female who was admitted to the psychiatric facility secondary to an attempted overdose of 20 tablets of fluoxetine 20 mg each.  She originally presented to the [facility] emergency room after calling EMS and attempting to vomit the pills up.  Patient reported that she had suicidal thoughts secondary to several psychosocial stressors including financial problems, continued arguments with her husband, and guilt of not allowing her father who is a cocaine addict to move in with her home.  She reported to her husband prior to the overdose that she would take the kids and leave him, then realized her husband was good for the kids and came up with the idea that they would be better off without her.  She is followed by Dr. Doyne Keel at [facility] behavioral health.  She had an appointment with her therapist on the date of admission.  She admitted to helplessness, hopelessness and worthlessness.  He was admitted to the hospital for evaluation and stabilization.  Continued Clinical Symptoms:  Alcohol Use Disorder Identification Test Final Score (AUDIT): 3 The "Alcohol Use Disorders Identification Test", Guidelines for Use in Primary Care, Second Edition.  World Pharmacologist [facility]). Score between 0-7:  no or low risk or alcohol related problems. Score between 8-15:  moderate risk of alcohol related problems. Score between 16-19:  high risk of alcohol related problems. Score 20 or above:  warrants further diagnostic evaluation for alcohol dependence and treatment.   CLINICAL FACTORS:   Depression:   Anhedonia Hopelessness Impulsivity Insomnia   Musculoskeletal: Strength & Muscle Tone: within normal limits Gait & Station: normal Patient leans: N/A  Psychiatric Specialty Exam: Physical Exam  Nursing note and vitals reviewed. Constitutional: She is oriented to person, place, and time. She appears well-developed and well-nourished.  HENT:  Head: Normocephalic and atraumatic.  Respiratory: Effort normal.  Neurological: She is alert and oriented to person, place, and time.    ROS  Blood pressure 117/76, pulse 78, temperature 98.5 F (  36.9 C), temperature  source Oral, resp. rate 18, height 5\' 4"  (1.626 m), weight 132.5 kg, last menstrual period 09/01/2017, unknown if currently breastfeeding.Body mass index is 50.12 kg/m.  General Appearance: Casual  Eye Contact:  Fair  Speech:  Normal Rate  Volume:  Normal  Mood:  Anxious and Depressed  Affect:  Congruent  Thought Process:  Coherent and Descriptions of Associations: Intact  Orientation:  Full (Time, Place, and Person)  Thought Content:  Logical  Suicidal Thoughts:  No  Homicidal Thoughts:  No  Memory:  Immediate;   Fair Recent;   Fair Remote;   Fair  Judgement:  Intact  Insight:  Fair  Psychomotor Activity:  Increased  Concentration:  Concentration: Fair and Attention Span: Fair  Recall:  [employer] of Knowledge:  Fair  Language:  Fair  Akathisia:  Negative  Handed:  Right  AIMS (if indicated):     Assets:  Communication Skills Desire for Improvement Financial Resources/Insurance Housing Intimacy Leisure Time [place] Talents/Skills Transportation  ADL's:  Intact  Cognition:  WNL  Sleep:  Number of Hours: 5.5      COGNITIVE FEATURES THAT CONTRIBUTE TO RISK:  None    SUICIDE RISK:   Mild:  Suicidal ideation of limited frequency, intensity, duration, and specificity.  There are no identifiable plans, no associated intent, mild dysphoria and related symptoms, good self-control (both objective and subjective assessment), few other risk factors, and identifiable protective factors, including available and accessible social support.  PLAN OF CARE: Patient is seen and examined.  Patient is a 37 year old female with a past psychiatric history significant for major depression. #1 depression-continue Prozac 20 mg p.o. Daily #2 anxiety-increase buspirone to 10 mg p.o. twice daily  #3 collateral information from her therapist and psychiatrist #4 discharge planning   I certify that inpatient services furnished can reasonably be expected to  improve the patient's condition.   Sharma Covert, MD 09/17/2017, 11:11 AM

## 2017-09-17 NOTE — BHH Group Notes (Addendum)
LCSW Group Therapy Note 09/17/2017 11:18 AM  Type of Therapy and Topic: Group Therapy: Overcoming Obstacles  Participation Level: Active  Description of Group:  In this group patients will be encouraged to explore what they see as obstacles to their own wellness and recovery. They will be guided to discuss their thoughts, feelings, and behaviors related to these obstacles. The group will process together ways to cope with barriers, with attention given to specific choices patients can make. Each patient will be challenged to identify changes they are motivated to make in order to overcome their obstacles. This group will be process-oriented, with patients participating in exploration of their own experiences as well as giving and receiving support and challenge from other group members.  Therapeutic Goals: 1. Patient will identify personal and current obstacles as they relate to admission. 2. Patient will identify barriers that currently interfere with their wellness or overcoming obstacles.  3. Patient will identify feelings, thought process and behaviors related to these barriers. 4. Patient will identify two changes they are willing to make to overcome these obstacles:   Summary of Patient Progress  Ashia was engaged and participated throughout the group session. She also contributed to the discussion by sharing advice with her peers. Nayla reports that her "finances" is her main obstacle currently. Marybella states that the lack of opportunities for new employment has been the main barrier. Brooklinn reports that she struggles with the guilt of not being able to buy her children school clothes this year.  Kimberli reports that she plans to engage in art work again, so that she can "channell the negative energy into something positive".    Therapeutic Modalities:  Cognitive Behavioral Therapy Solution Focused Therapy Motivational Interviewing Relapse Prevention Therapy   Theresa Duty Clinical Social Worker

## 2017-09-17 NOTE — Plan of Care (Signed)
  Problem: Health Behavior/Discharge Planning: Goal: Compliance with treatment plan for underlying cause of condition will improve Outcome: Progressing   Problem: Safety: Goal: Periods of time without injury will increase Outcome: Progressing   

## 2017-09-17 NOTE — BHH Group Notes (Signed)
Adult Psychoeducational Group Note  Date:  09/17/2017  Time: 3:15 PM  Group Topic/Focus: Self-Care  Participation Level:  Active  Participation Quality:  Appropriate and Attentive  Affect:  Appropriate  Cognitive:  Alert and Oriented  Insight: Improving  Engagement in Group:  Developing/Improving  Modes of Intervention:  Discussion and Education  Additional Comments:  Patient completed self-care assessment and contributed to the group discussion. Patient states she would like to work on taking time to do her make-up and skin routine like she did before she had children.  [name] 09/17/2017 4:00 PM

## 2017-09-17 NOTE — Progress Notes (Signed)
D: Patient denies SI, HI or AVH this eveing. Patient is flat but pleasant and cooperative on approach while working on a puzzle.  Pt. States she is, "doing alright, feeling remorseful for what I've done".  She denies any physical complaints or needs.  Pt. Attended evening wrap up group with participation.  A: Patient given emotional support from RN. Patient encouraged to come to staff with concerns and/or questions. Patient's medication routine continued. Patient's orders and plan of care reviewed.   R: Patient remains appropriate and cooperative. Will continue to monitor patient q15 minutes for safety.

## 2017-09-17 NOTE — H&P (Signed)
Psychiatric Admission Assessment Adult  Patient Identification: [name] MRN:  [id number] Date of Evaluation:  09/17/2017 Chief Complaint:  MDD RECURRENT SEVERE Principal Diagnosis: <principal problem not specified> Diagnosis:   Patient Active Problem List   Diagnosis Date Noted  . Major depressive disorder, recurrent severe without psychotic features ([place]) [F33.2] 09/16/2017  . Neck pain [M54.2] 08/14/2017  . Pre-diabetes [R73.03] 08/14/2017  . Nicotine dependence, cigarettes, uncomplicated [J33.545] 62/56/3893  . Fatigue [R53.83] 11/03/2015  . Allergic rhinitis [J30.9] 05/16/2015  . S/P C-section [T34.287] 12/18/2014  . Hyperlipidemia [E78.5] 12/16/2014  . 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 Major depressive disorder, recurrent, severe without psychotic features ([place]) [F33.2] 09/23/2014  . GAD (generalized anxiety disorder) [F41.1] 09/23/2014  . Social anxiety disorder [F40.10] 09/23/2014  . Routine general medical examination at a health care facility [Z00.00] 01/07/2014  . OSA on CPAP [G47.33, Z99.89] 11/26/2011  . Morbid obesity ([place]) [E66.01] 05/17/2011  . Chronic pain of right knee [M25.561, G89.29] 02/16/2011  . Depression [F32.9] 02/16/2011  . Hx gestational diabetes [Z86.32] 02/16/2011  . Eczema [L30.9] 02/16/2011   History of Present Illness: Patient is seen and examined.  Patient is a 37 year old female who was admitted to the psychiatric facility secondary to an attempted overdose of 20 tablets of fluoxetine 20 mg each.  She originally presented to the [facility] emergency room after calling EMS and attempting to vomit the pills up.  Patient reported that she had suicidal thoughts secondary to several psychosocial stressors including financial problems, continued arguments with her husband, and guilt of not allowing her father who is a cocaine addict to move in with her home.  She reported to her husband prior to the overdose  that she would take the kids and leave him, then realized her husband was good for the kids and came up with the idea that they would be better off without her.  She is followed by Dr. Doyne Keel at [facility] behavioral health.  She had an appointment with her therapist on the date of admission.  She admitted to helplessness, hopelessness and worthlessness.  He was admitted to the hospital for evaluation and stabilization.  Associated Signs/Symptoms: Depression Symptoms:  depressed mood, anhedonia, insomnia, psychomotor agitation, fatigue, feelings of worthlessness/guilt, difficulty concentrating, hopelessness, suicidal thoughts without plan, suicidal thoughts with specific plan, suicidal attempt, anxiety, loss of energy/fatigue, disturbed sleep, (Hypo) Manic Symptoms:  Impulsivity, Irritable Mood, Anxiety Symptoms:  Excessive Worry, Psychotic Symptoms:  Denied PTSD Symptoms: Negative Total Time spent with patient: 30 minutes  Past Psychiatric History: Denied any previous psychiatric admissions. She is being followed by Dr. Doyne Keel in the outpatient clinic.  She has previously been treated with Prozac and BuSpar.  She stated that prior to the event that the Prozac was doing well.  Is the patient at risk to self? Yes.    Has the patient been a risk to self in the past 6 months? No.  Has the patient been a risk to self within the distant past? No.  Is the patient a risk to others? No.  Has the patient been a risk to others in the past 6 months? No.  Has the patient been a risk to others within the distant past? No.   Prior Inpatient Therapy:   Prior Outpatient Therapy:    Alcohol Screening: 1. How often do you have a drink containing alcohol?: 2 to 4 times a month 2. How many drinks containing alcohol do you have on a typical day  when you are drinking?: 3 or 4 3. How often do you have six or more drinks on one occasion?: Never AUDIT-C Score: 3 4. How often during the last year have  you found that you were not able to stop drinking once you had started?: Never 5. How often during the last year have you failed to do what was normally expected from you becasue of drinking?: Never 6. How often during the last year have you needed a first drink in the morning to get yourself going after a heavy drinking session?: Never 7. How often during the last year have you had a feeling of guilt of remorse after drinking?: Never 8. How often during the last year have you been unable to remember what happened the night before because you had been drinking?: Never 9. Have you or someone else been injured as a result of your drinking?: No 10. Has a relative or friend or a doctor or another health worker been concerned about your drinking or suggested you cut down?: No Alcohol Use Disorder Identification Test Final Score (AUDIT): 3 Intervention/Follow-up: AUDIT Score <7 follow-up not indicated Substance Abuse History in the last 12 months:  No. Consequences of Substance Abuse: Negative Previous Psychotropic Medications: Yes  Psychological Evaluations: Yes  Past Medical History:  Past Medical History:  Diagnosis Date  . Anxiety   . Cluster headaches   . Depression   . Eczema   . GERD (gastroesophageal reflux disease)   . Gestational diabetes 2012, 2016  . Hyperlipidemia   . Hypertension    no meds currently  . Sciatica   . Sleep apnea   . Snoring    sleep study 02/2012 with min AHI events    Past Surgical History:  Procedure Laterality Date  . CESAREAN SECTION  09 & 12   x's 2  . CESAREAN SECTION WITH BILATERAL TUBAL LIGATION Bilateral 12/18/2014   Procedure: REPEAT CESAREAN SECTION WITH BILATERAL TUBAL LIGATION;  Surgeon: Crawford Givens, MD;  Location: [facility];  Service: Obstetrics;  Laterality: Bilateral;  . COLPOSCOPY    . DENTAL SURGERY    . Ganglion removal from (L) wrist  1998  . LAPAROSCOPIC UNILATERAL SALPINGECTOMY Left 01/26/2015   Procedure: LAPAROSCOPIC UNILATERAL  SALPINGECTOMY;  Surgeon: Crawford Givens, MD;  Location: [facility];  Service: Gynecology;  Laterality: Left;  . WISDOM TOOTH EXTRACTION     Family History:  Family History  Problem Relation Age of Onset  . Arthritis Mother   . Alcohol abuse Mother   . Mental illness Mother   . Cancer Mother 51       squamous - unknown primary  . Coronary artery disease Mother 66       MI with stent  . Anxiety disorder Mother   . Depression Mother   . Arthritis Father   . Alcohol abuse Father   . Hyperlipidemia Father   . Heart disease Father   . Kidney disease Father   . Stroke Father   . Mental illness Father   . Drug abuse Father   . Diabetes Maternal Grandmother   . Heart disease Maternal Grandmother   . Stroke Maternal Grandfather   . Hypertension Paternal Grandfather   . Hyperlipidemia Paternal Grandfather   . Arthritis Other   . Alcohol abuse Other   . Breast cancer Paternal Aunt   . Anxiety disorder Sister    Family Psychiatric  History: Father has a history of crack dependence.  Her mother has depression and anxiety, her sister  has anxiety. Tobacco Screening: Have you used any form of tobacco in the last 30 days? (Cigarettes, Smokeless Tobacco, Cigars, and/or Pipes): Yes Tobacco use, Select all that apply: 5 or more cigarettes per day Are you interested in Tobacco Cessation Medications?: No, patient refused Counseled patient on smoking cessation including recognizing danger situations, developing coping skills and basic information about quitting provided: Refused/Declined practical counseling Social History:  Social History   Substance and Sexual Activity  Alcohol Use Yes   Comment: once a month or less     Social History   Substance and Sexual Activity  Drug Use No    Additional Social History: Marital status: Married Number of Years Married: 7 What types of issues is patient dealing with in the relationship?: Finances Additional relationship information: Patient reports her  husband suffers from mental illness.  Are you sexually active?: Yes What is your sexual orientation?: Heterosexual  Has your sexual activity been affected by drugs, alcohol, medication, or emotional stress?: No  Does patient have children?: Yes How many children?: 3 How is patient's relationship with their children?: Patient reports having a good relationship with her three children                         Allergies:   Allergies  Allergen Reactions  . Codeine     STOMACH CRAMPING  . Doxycycline Nausea And Vomiting   Lab Results:  Results for orders placed or performed during the hospital encounter of 09/15/17 (from the past 48 hour(s))  Comprehensive metabolic panel     Status: Abnormal   Collection Time: 09/15/17  4:12 PM  Result Value Ref Range   Sodium 138 135 - 145 mmol/L   Potassium 4.1 3.5 - 5.1 mmol/L   Chloride 105 98 - 111 mmol/L   CO2 27 22 - 32 mmol/L   Glucose, Bld 126 (H) 70 - 99 mg/dL   BUN 10 6 - 20 mg/dL   Creatinine, Ser 0.93 0.44 - 1.00 mg/dL   Calcium 9.0 8.9 - 10.3 mg/dL   Total Protein 7.0 6.5 - 8.1 g/dL   Albumin 3.9 3.5 - 5.0 g/dL   AST 16 15 - 41 U/L   ALT 19 0 - 44 U/L   Alkaline Phosphatase 79 38 - 126 U/L   Total Bilirubin 0.5 0.3 - 1.2 mg/dL   GFR calc non Af Amer >60 >60 mL/min   GFR calc Af Amer >60 >60 mL/min    Comment: (NOTE) The eGFR has been calculated using the CKD EPI equation. This calculation has not been validated in all clinical situations. eGFR's persistently <60 mL/min signify possible Chronic Kidney Disease.    Anion gap 6 5 - 15    Comment: Performed at [facility], [place] [address] [place], [place] [address]  Ethanol     Status: None   Collection Time: 09/15/17  4:12 PM  Result Value Ref Range   Alcohol, Ethyl (B) <10 <10 mg/dL    Comment: (NOTE) Lowest detectable limit for serum alcohol is 10 mg/dL. For medical purposes only. Performed at [facility], [place] [address], [place],  [address]   Urine rapid drug screen (hosp performed)     Status: None   Collection Time: 09/15/17  4:12 PM  Result Value Ref Range   Opiates NONE DETECTED NONE DETECTED   Cocaine NONE DETECTED NONE DETECTED   Benzodiazepines NONE DETECTED NONE DETECTED   Amphetamines NONE DETECTED NONE  DETECTED   Tetrahydrocannabinol NONE DETECTED NONE DETECTED   Barbiturates NONE DETECTED NONE DETECTED    Comment: (NOTE) DRUG SCREEN FOR MEDICAL PURPOSES ONLY.  IF CONFIRMATION IS NEEDED FOR ANY PURPOSE, NOTIFY LAB WITHIN 5 DAYS. LOWEST DETECTABLE LIMITS FOR URINE DRUG SCREEN Drug Class                     Cutoff (ng/mL) Amphetamine and metabolites    1000 Barbiturate and metabolites    200 Benzodiazepine                 597 Tricyclics and metabolites     300 Opiates and metabolites        300 Cocaine and metabolites        300 THC                            50 Performed at [facility], [place] [address], [place], [place] [address]   Pregnancy, urine     Status: None   Collection Time: 09/15/17  4:12 PM  Result Value Ref Range   Preg Test, Ur NEGATIVE NEGATIVE    Comment:        THE SENSITIVITY OF THIS METHODOLOGY IS >20 mIU/mL. Performed at [facility], [place] [address], [place], [place] [address]   Salicylate level     Status: None   Collection Time: 09/15/17  4:12 PM  Result Value Ref Range   Salicylate Lvl <6.8 2.8 - 30.0 mg/dL    Comment: Performed at [facility], [place] [address] [place], [place] [address]  CBC with Differential     Status: None   Collection Time: 09/15/17  4:12 PM  Result Value Ref Range   WBC 7.8 4.0 - 10.5 K/uL   RBC 4.44 3.87 - 5.11 MIL/uL   Hemoglobin 13.0 12.0 - 15.0 g/dL   HCT 39.7 36.0 - 46.0 %   MCV 89.4 78.0 - 100.0 fL   MCH 29.3 26.0 - 34.0 pg   MCHC 32.7 30.0 - 36.0 g/dL   RDW 13.7 11.5 - 15.5 %   Platelets 288 150 - 400 K/uL   Neutrophils Relative % 77 %   Neutro Abs 6.0  1.7 - 7.7 K/uL   Lymphocytes Relative 19 %   Lymphs Abs 1.5 0.7 - 4.0 K/uL   Monocytes Relative 3 %   Monocytes Absolute 0.3 0.1 - 1.0 K/uL   Eosinophils Relative 1 %   Eosinophils Absolute 0.1 0.0 - 0.7 K/uL   Basophils Relative 0 %   Basophils Absolute 0.0 0.0 - 0.1 K/uL    Comment: Performed at [facility], [place] [address], [place], [place] [address]  Acetaminophen level     Status: Abnormal   Collection Time: 09/15/17  7:00 PM  Result Value Ref Range   Acetaminophen (Tylenol), Serum <10 (L) 10 - 30 ug/mL    Comment: (NOTE) Therapeutic concentrations vary significantly. A range of 10-30 ug/mL  may be an effective concentration for many patients. However, some  are best treated at concentrations outside of this range. Acetaminophen concentrations >150 ug/mL at 4 hours after ingestion  and >50 ug/mL at 12 hours after ingestion are often associated with  toxic reactions. Performed at [facility], [place] [address], [place], [place] [address]     Blood Alcohol level:  Lab Results  Component Value Date   ETH <10 09/15/2017  ETH <5 28/36/6294    Metabolic Disorder Labs:  Lab Results  Component Value Date   HGBA1C 5.9 08/14/2017   No results found for: PROLACTIN Lab Results  Component Value Date   CHOL 192 08/14/2017   TRIG 150.0 (H) 08/14/2017   HDL 33.90 (L) 08/14/2017   CHOLHDL 6 08/14/2017   VLDL 30.0 08/14/2017   LDLCALC 128 (H) 08/14/2017   LDLCALC 152 (H) 02/09/2015    Current Medications: Current Facility-Administered Medications  Medication Dose Route Frequency Provider Last Rate Last Dose  . acetaminophen (TYLENOL) tablet 650 mg  650 mg Oral Q6H PRN Patrecia Pour, NP      . alum & mag hydroxide-simeth (MAALOX/MYLANTA) 200-200-20 MG/5ML suspension 30 mL  30 mL Oral Q4H PRN Patrecia Pour, NP      . busPIRone (BUSPAR) tablet 10 mg  10 mg Oral BID Sharma Covert, MD   10 mg at 09/17/17 0811  . FLUoxetine (PROZAC)  capsule 20 mg  20 mg Oral Daily Sharma Covert, MD   20 mg at 09/17/17 0811  . magnesium hydroxide (MILK OF MAGNESIA) suspension 30 mL  30 mL Oral Daily PRN Patrecia Pour, NP      . nicotine (NICODERM CQ - dosed in mg/24 hours) patch 14 mg  14 mg Transdermal Daily Sharma Covert, MD      . ondansetron [facility]) tablet 4 mg  4 mg Oral Q8H PRN Patrecia Pour, NP       PTA Medications: Medications Prior to Admission  Medication Sig Dispense Refill Last Dose  . busPIRone (BUSPAR) 5 MG tablet Take 1 tablet (5 mg total) by mouth 2 (two) times daily. 180 tablet 0 09/14/2017 at Unknown time  . cyclobenzaprine (FLEXERIL) 10 MG tablet Take 1 tablet (10 mg total) by mouth 3 (three) times daily as needed for muscle spasms. 30 tablet 0 2 weeks at Unknown time  . FLUoxetine (PROZAC) 20 MG capsule Take 3 capsules (60 mg total) by mouth daily. 270 capsule 0 09/15/2017 at Unknown time  . ibuprofen (ADVIL,MOTRIN) 600 MG tablet Take 1 tablet (600 mg total) by mouth every 8 (eight) hours as needed for moderate pain. 90 tablet 0 09/14/2017 at Unknown time    Musculoskeletal: Strength & Muscle Tone: within normal limits Gait & Station: normal Patient leans: N/A  Psychiatric Specialty Exam: Physical Exam  Nursing note and vitals reviewed. Constitutional: She is oriented to person, place, and time. She appears well-developed and well-nourished.  HENT:  Head: Normocephalic and atraumatic.  Respiratory: Effort normal.  Neurological: She is alert and oriented to person, place, and time.    ROS  Blood pressure 117/76, pulse 78, temperature 98.5 F (36.9 C), temperature source Oral, resp. rate 18, height '5\' 4"'  (1.626 m), weight 132.5 kg, last menstrual period 09/01/2017, unknown if currently breastfeeding.Body mass index is 50.12 kg/m.  General Appearance: Casual  Eye Contact:  Fair  Speech:  Normal Rate  Volume:  Normal  Mood:  Depressed  Affect:  Congruent  Thought Process:  Coherent and Descriptions  of Associations: Intact  Orientation:  Full (Time, Place, and Person)  Thought Content:  Logical  Suicidal Thoughts:  No  Homicidal Thoughts:  No  Memory:  Immediate;   Fair Recent;   Fair Remote;   Fair  Judgement:  Intact  Insight:  Fair  Psychomotor Activity:  Increased  Concentration:  Concentration: Fair and Attention Span: Fair  Recall:  [employer] of Knowledge:  Fair  Language:  Fair  Akathisia:  Negative  Handed:  Right  AIMS (if indicated):     Assets:  Communication Skills Desire for Improvement Financial Resources/Insurance Housing Intimacy Physical Health Resilience Social Support Talents/Skills  ADL's:  Intact  Cognition:  WNL  Sleep:  Number of Hours: 5.5    Treatment Plan Summary: Daily contact with patient to assess and evaluate symptoms and progress in treatment, Medication management and Plan Patient is seen and examined.  Patient is a 37 year old female with a past psychiatric history significant for major depression. #1 major depression-restart Prozac 20 mg p.o. daily, #2 anxiety-increase BuSpar to 10 mg p.o. twice daily, #3 collateral information from her therapist and psychiatrist, #4 discharge planning. Observation Level/Precautions:  15 minute checks  Laboratory:  Chemistry Profile  Psychotherapy:    Medications:    Consultations:    Discharge Concerns:    Estimated LOS:  Other:     Physician Treatment Plan for Primary Diagnosis: <principal problem not specified> Long Term Goal(s): Improvement in symptoms so as ready for discharge  Short Term Goals: Ability to identify changes in lifestyle to reduce recurrence of condition will improve, Ability to verbalize feelings will improve, Ability to disclose and discuss suicidal ideas, Ability to demonstrate self-control will improve, Ability to identify and develop effective coping behaviors will improve and Ability to maintain clinical measurements within normal limits will improve  Physician  Treatment Plan for Secondary Diagnosis: Active Problems:   Major depressive disorder, recurrent severe without psychotic features ([place])  Long Term Goal(s): Improvement in symptoms so as ready for discharge  Short Term Goals: Ability to identify changes in lifestyle to reduce recurrence of condition will improve, Ability to verbalize feelings will improve, Ability to disclose and discuss suicidal ideas, Ability to demonstrate self-control will improve, Ability to identify and develop effective coping behaviors will improve and Ability to maintain clinical measurements within normal limits will improve  I certify that inpatient services furnished can reasonably be expected to improve the patient's condition.    Sharma Covert, MD 9/10/20191:03 PM

## 2017-09-17 NOTE — Progress Notes (Signed)
Patient ID: [name], female   DOB: 09/27/1980, 37 y.o.   MRN: [id number]  Nursing Progress Note [id number]  Data: Patient presents pleasant and cooperative and was complaint with scheduled medications. Patient denies pain/physical complaints. Patient endorses she can't wait to discharge but does report she will keep and open mind and attend groups. Patient remains remorseful over her SI attempt. Patient completed self-inventory sheet and rates depression, hopelessness, and anxiety 5,2,5 respectively. Patient rates their sleep and appetite as fair/good respectively. Patient states goal for today is to "learn healthy ways to cope with stress". Patient is seen attending groups and visible in the milieu. Patient currently denies SI/HI/AVH.   Action: Patient educated about and provided medication per provider's orders. Patient safety maintained with q15 min safety checks and frequent rounding. Low fall risk precautions in place. Emotional support given. 1:1 interaction and active listening provided. Patient encouraged to attend meals and groups. Patient encouraged to work on treatment plan and goals. Labs, vital signs and patient behavior monitored throughout shift.   Response: Patient remains safe on the unit at this time. Patient is interacting with peers appropriately on the unit. Will continue to support and monitor.

## 2017-09-18 MED ORDER — BUSPIRONE HCL 10 MG PO TABS
10.0000 mg | ORAL_TABLET | Freq: Three times a day (TID) | ORAL | Status: DC
  Administered 2017-09-18 – 2017-09-19 (×3): 10 mg via ORAL
  Filled 2017-09-18 (×9): qty 1

## 2017-09-18 NOTE — [facility] Suicide Risk Assessment (Signed)
[place] INPATIENT:  Family/Significant Other Suicide Prevention Education  Suicide Prevention Education:  Education Completed; Laraina Sulton, husband [phone]) has been identified by the patient as the family member/significant other with whom the patient will be residing, and identified as the person(s) who will aid the patient in the event of a mental health crisis (suicidal ideations/suicide attempt).  With written consent from the patient, the family member/significant other has been provided the following suicide prevention education, prior to the and/or following the discharge of the patient.  The suicide prevention education provided includes the following:  Suicide risk factors  Suicide prevention and interventions  National Suicide Hotline telephone number  [facility] assessment telephone number  [facility] Emergency Assistance [facility] and/or Residential Mobile Crisis Unit telephone number  Request made of family/significant other to:  Remove weapons (e.g., guns, rifles, knives), all items previously/currently identified as safety concern.    Remove drugs/medications (over-the-counter, prescriptions, illicit drugs), all items previously/currently identified as a safety concern.  The family member/significant other verbalizes understanding of the suicide prevention education information provided.  The family member/significant other agrees to remove the items of safety concern listed above.  Marylee Floras 09/18/2017, 3:20 PM

## 2017-09-18 NOTE — Progress Notes (Signed)
Pt seems slightly more relaxed than last night.  She still was short in conversation with Probation officer.  She denies SI/HI/AVH.  She voiced no needs or concerns other than getting her CPAP tonight prior to going to bed.  Pt declined needing a sleep aid tonight, stating she did not need it.  Pt spent the evening in the dayroom talking with peers and watching TV.  Support and encouragement offered.  Discharge plans are in process.  Pt says she is discharging home tomorrow.  Safety maintained with q15 minute checks.

## 2017-09-18 NOTE — Progress Notes (Signed)
[facility] MD Progress Note  09/18/2017 10:38 AM [name]  MRN:  [id number] Subjective: Patient is seen and examined.  Patient is a 37 year old female with a past psychiatric history significant for major depression who is seen in follow-up.  She states she is doing better today.  She stated that she is not suicidal.  She was irritable last night, but she is been working on her coping skills.  That is proved beneficial.  She would like to attend the peers support group after discharge.  She denied any GI side effects from the fluoxetine overdose.  She denied any suicidal ideation or any other side effects currently. Principal Problem: <principal problem not specified> Diagnosis:   Patient Active Problem List   Diagnosis Date Noted  . Major depressive disorder, recurrent severe without psychotic features ([place]) [F33.2] 09/16/2017  . Neck pain [M54.2] 08/14/2017  . Pre-diabetes [R73.03] 08/14/2017  . Nicotine dependence, cigarettes, uncomplicated [F02.637] 85/88/5027  . Fatigue [R53.83] 11/03/2015  . Allergic rhinitis [J30.9] 05/16/2015  . S/P C-section [X41.287] 12/18/2014  . Hyperlipidemia [E78.5] 12/16/2014  . 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 Major depressive disorder, recurrent, severe without psychotic features ([place]) [F33.2] 09/23/2014  . GAD (generalized anxiety disorder) [F41.1] 09/23/2014  . Social anxiety disorder [F40.10] 09/23/2014  . Routine general medical examination at a health care facility [Z00.00] 01/07/2014  . OSA on CPAP [G47.33, Z99.89] 11/26/2011  . Morbid obesity ([place]) [E66.01] 05/17/2011  . Chronic pain of right knee [M25.561, G89.29] 02/16/2011  . Depression [F32.9] 02/16/2011  . Hx gestational diabetes [Z86.32] 02/16/2011  . Eczema [L30.9] 02/16/2011   Total Time spent with patient: 15 minutes  Past Psychiatric History: See admission H&P  Past Medical History:  Past Medical History:  Diagnosis Date  . Anxiety   . Cluster headaches   .  Depression   . Eczema   . GERD (gastroesophageal reflux disease)   . Gestational diabetes 2012, 2016  . Hyperlipidemia   . Hypertension    no meds currently  . Sciatica   . Sleep apnea   . Snoring    sleep study 02/2012 with min AHI events    Past Surgical History:  Procedure Laterality Date  . CESAREAN SECTION  09 & 12   x's 2  . CESAREAN SECTION WITH BILATERAL TUBAL LIGATION Bilateral 12/18/2014   Procedure: REPEAT CESAREAN SECTION WITH BILATERAL TUBAL LIGATION;  Surgeon: Crawford Givens, MD;  Location: [facility];  Service: Obstetrics;  Laterality: Bilateral;  . COLPOSCOPY    . DENTAL SURGERY    . Ganglion removal from (L) wrist  1998  . LAPAROSCOPIC UNILATERAL SALPINGECTOMY Left 01/26/2015   Procedure: LAPAROSCOPIC UNILATERAL SALPINGECTOMY;  Surgeon: Crawford Givens, MD;  Location: [facility];  Service: Gynecology;  Laterality: Left;  . WISDOM TOOTH EXTRACTION     Family History:  Family History  Problem Relation Age of Onset  . Arthritis Mother   . Alcohol abuse Mother   . Mental illness Mother   . Cancer Mother 9       squamous - unknown primary  . Coronary artery disease Mother 66       MI with stent  . Anxiety disorder Mother   . Depression Mother   . Arthritis Father   . Alcohol abuse Father   . Hyperlipidemia Father   . Heart disease Father   . Kidney disease Father   . Stroke Father   . Mental illness Father   . Drug abuse Father   .  Diabetes Maternal Grandmother   . Heart disease Maternal Grandmother   . Stroke Maternal Grandfather   . Hypertension Paternal Grandfather   . Hyperlipidemia Paternal Grandfather   . Arthritis Other   . Alcohol abuse Other   . Breast cancer Paternal Aunt   . Anxiety disorder Sister    Family Psychiatric  History: See admission H&P Social History:  Social History   Substance and Sexual Activity  Alcohol Use Yes   Comment: once a month or less     Social History   Substance and Sexual Activity  Drug Use No    Social History    Socioeconomic History  . Marital status: Married    Spouse name: Not on file  . Number of children: 3  . Years of education: 23  . Highest education level: Not on file  Occupational History  . Occupation: Group Transport planner  . Financial resource strain: Not on file  . Food insecurity:    Worry: Not on file    Inability: Not on file  . Transportation needs:    Medical: Not on file    Non-medical: Not on file  Tobacco Use  . Smoking status: Current Every Day Smoker    Packs/day: 0.30    Years: 15.00    Pack years: 4.50    Types: Cigarettes    Last attempt to quit: 06/24/2016    Years since quitting: 1.2  . Smokeless tobacco: Never Used  . Tobacco comment: Reports started back and trying ot quit again  Substance and Sexual Activity  . Alcohol use: Yes    Comment: once a month or less  . Drug use: No  . Sexual activity: Yes    Partners: Male    Birth control/protection: None  Lifestyle  . Physical activity:    Days per week: Not on file    Minutes per session: Not on file  . Stress: Not on file  Relationships  . Social connections:    Talks on phone: Not on file    Gets together: Not on file    Attends religious service: Not on file    Active member of club or organization: Not on file    Attends meetings of clubs or organizations: Not on file    Relationship status: Not on file  Other Topics Concern  . Not on file  Social History Narrative   Secretary   Married, lives with spouse and 2 kids   Moved to [facility] summer 2011 to be near mom - originally from [place]   Denies abuse and feels safe at home.   Additional Social History:                         Sleep: Good  Appetite:  Fair  Current Medications: Current Facility-Administered Medications  Medication Dose Route Frequency Provider Last Rate Last Dose  . acetaminophen (TYLENOL) tablet 650 mg  650 mg Oral Q6H PRN Patrecia Pour, NP      . alum & mag hydroxide-simeth (MAALOX/MYLANTA)  200-200-20 MG/5ML suspension 30 mL  30 mL Oral Q4H PRN Patrecia Pour, NP      . busPIRone (BUSPAR) tablet 10 mg  10 mg Oral BID Sharma Covert, MD   10 mg at 09/18/17 0820  . FLUoxetine (PROZAC) capsule 20 mg  20 mg Oral Daily Sharma Covert, MD   20 mg at 09/18/17 0820  . magnesium hydroxide (MILK OF MAGNESIA) suspension  30 mL  30 mL Oral Daily PRN Patrecia Pour, NP   30 mL at 09/18/17 0820  . ondansetron (ZOFRAN) tablet 4 mg  4 mg Oral Q8H PRN Patrecia Pour, NP        Lab Results: No results found for this or any previous visit (from the past 48 hour(s)).  Blood Alcohol level:  Lab Results  Component Value Date   ETH <10 09/15/2017   ETH <5 81/82/9937    Metabolic Disorder Labs: Lab Results  Component Value Date   HGBA1C 5.9 08/14/2017   No results found for: PROLACTIN Lab Results  Component Value Date   CHOL 192 08/14/2017   TRIG 150.0 (H) 08/14/2017   HDL 33.90 (L) 08/14/2017   CHOLHDL 6 08/14/2017   VLDL 30.0 08/14/2017   LDLCALC 128 (H) 08/14/2017   LDLCALC 152 (H) 02/09/2015    Physical Findings: AIMS: Facial and Oral Movements Muscles of Facial Expression: None, normal Lips and Perioral Area: None, normal Jaw: None, normal Tongue: None, normal,Extremity Movements Upper (arms, wrists, hands, fingers): None, normal Lower (legs, knees, ankles, toes): None, normal, Trunk Movements Neck, shoulders, hips: None, normal, Overall Severity Severity of abnormal movements (highest score from questions above): None, normal Incapacitation due to abnormal movements: None, normal Patient's awareness of abnormal movements (rate only patient's report): No Awareness, Dental Status Current problems with teeth and/or dentures?: No Does patient usually wear dentures?: No  CIWA:    COWS:     Musculoskeletal: Strength & Muscle Tone: within normal limits Gait & Station: normal Patient leans: N/A  Psychiatric Specialty Exam: Physical Exam  Nursing note and vitals  reviewed. Constitutional: She is oriented to person, place, and time. She appears well-developed and well-nourished.  HENT:  Head: Normocephalic and atraumatic.  Respiratory: Effort normal.  Neurological: She is oriented to person, place, and time.    ROS  Blood pressure 126/76, pulse 74, temperature 98.4 F (36.9 C), temperature source Oral, resp. rate 20, height 5\' 4"  (1.626 m), weight 132.5 kg, last menstrual period 09/01/2017, unknown if currently breastfeeding.Body mass index is 50.12 kg/m.  General Appearance: Casual  Eye Contact:  Good  Speech:  Normal Rate  Volume:  Normal  Mood:  Anxious  Affect:  Congruent  Thought Process:  Coherent and Descriptions of Associations: Intact  Orientation:  Full (Time, Place, and Person)  Thought Content:  Logical  Suicidal Thoughts:  No  Homicidal Thoughts:  No  Memory:  Immediate;   Fair Recent;   Fair Remote;   Fair  Judgement:  Intact  Insight:  Fair  Psychomotor Activity:  Normal  Concentration:  Concentration: Fair and Attention Span: Fair  Recall:  [employer] of Knowledge:  Fair  Language:  Good  Akathisia:  Negative  Handed:  Right  AIMS (if indicated):     Assets:  Communication Skills Desire for Improvement Financial Resources/Insurance Housing Physical Health Resilience Social Support  ADL's:  Intact  Cognition:  WNL  Sleep:  Number of Hours: 6.75     Treatment Plan Summary: Daily contact with patient to assess and evaluate symptoms and progress in treatment, Medication management and Plan : Patient is seen and examined.  Patient is a 37 year old female with a past psychiatric history significant for major depression who is seen in follow-up.  #1 major depression-continue Prozac 20 mg p.o. daily.  #2 generalized anxiety-continue BuSpar 10 mg p.o. 3 times daily.  #3 discharge planning-if patient continues to improve we will consider discharge in 1  to 2 days.  Sharma Covert, MD 09/18/2017, 10:38 AM

## 2017-09-18 NOTE — Tx Team (Signed)
Interdisciplinary Treatment and Diagnostic Plan Update  09/18/2017 Time of Session: [place] MRN: [id number]  Principal Diagnosis: <principal problem not specified>  Secondary Diagnoses: Active Problems:   Major depressive disorder, recurrent severe without psychotic features ([place])   Current Medications:  Current Facility-Administered Medications  Medication Dose Route Frequency Provider Last Rate Last Dose  . acetaminophen (TYLENOL) tablet 650 mg  650 mg Oral Q6H PRN Patrecia Pour, NP      . alum & mag hydroxide-simeth (MAALOX/MYLANTA) 200-200-20 MG/5ML suspension 30 mL  30 mL Oral Q4H PRN Patrecia Pour, NP      . busPIRone (BUSPAR) tablet 10 mg  10 mg Oral TID Sharma Covert, MD   10 mg at 09/18/17 1211  . FLUoxetine (PROZAC) capsule 20 mg  20 mg Oral Daily Sharma Covert, MD   20 mg at 09/18/17 0820  . magnesium hydroxide (MILK OF MAGNESIA) suspension 30 mL  30 mL Oral Daily PRN Patrecia Pour, NP   30 mL at 09/18/17 0820  . ondansetron (ZOFRAN) tablet 4 mg  4 mg Oral Q8H PRN Patrecia Pour, NP       PTA Medications: Medications Prior to Admission  Medication Sig Dispense Refill Last Dose  . busPIRone (BUSPAR) 5 MG tablet Take 1 tablet (5 mg total) by mouth 2 (two) times daily. 180 tablet 0 09/14/2017 at Unknown time  . cyclobenzaprine (FLEXERIL) 10 MG tablet Take 1 tablet (10 mg total) by mouth 3 (three) times daily as needed for muscle spasms. 30 tablet 0 2 weeks at Unknown time  . FLUoxetine (PROZAC) 20 MG capsule Take 3 capsules (60 mg total) by mouth daily. 270 capsule 0 09/15/2017 at Unknown time  . ibuprofen (ADVIL,MOTRIN) 600 MG tablet Take 1 tablet (600 mg total) by mouth every 8 (eight) hours as needed for moderate pain. 90 tablet 0 09/14/2017 at Unknown time    Patient Stressors: Financial difficulties Marital or family conflict  Patient Strengths: Ability for insight Average or above average intelligence Capable of independent living [employer] of knowledge Supportive family/friends  Treatment Modalities: Medication Management, Group therapy, Case management,  1 to 1 session with clinician, Psychoeducation, Recreational therapy.   Physician Treatment Plan for Primary Diagnosis: <principal problem not specified> Long Term Goal(s): Improvement in symptoms so as ready for discharge Improvement in symptoms so as ready for discharge   Short Term Goals: Ability to identify changes in lifestyle to reduce recurrence of condition will improve Ability to verbalize feelings will improve Ability to disclose and discuss suicidal ideas Ability to demonstrate self-control will improve Ability to identify and develop effective coping behaviors will improve Ability to maintain clinical measurements within normal limits will improve Ability to identify changes in lifestyle to reduce recurrence of condition will improve Ability to verbalize feelings will improve Ability to disclose and discuss suicidal ideas Ability to demonstrate self-control will improve Ability to identify and develop effective coping behaviors will improve Ability to maintain clinical measurements within normal limits will improve  Medication Management: Evaluate patient's response, side effects, and tolerance of medication regimen.  Therapeutic Interventions: 1 to 1 sessions, Unit Group sessions and Medication administration.  Evaluation of Outcomes: Progressing  Physician Treatment Plan for Secondary Diagnosis: Active Problems:   Major depressive disorder, recurrent severe without psychotic features ([place])  Long Term Goal(s): Improvement in symptoms so as ready for discharge Improvement in symptoms so as ready for discharge   Short Term Goals: Ability to identify changes in lifestyle  to reduce recurrence of condition will improve Ability to verbalize feelings will improve Ability to disclose and discuss suicidal ideas Ability to demonstrate self-control will  improve Ability to identify and develop effective coping behaviors will improve Ability to maintain clinical measurements within normal limits will improve Ability to identify changes in lifestyle to reduce recurrence of condition will improve Ability to verbalize feelings will improve Ability to disclose and discuss suicidal ideas Ability to demonstrate self-control will improve Ability to identify and develop effective coping behaviors will improve Ability to maintain clinical measurements within normal limits will improve     Medication Management: Evaluate patient's response, side effects, and tolerance of medication regimen.  Therapeutic Interventions: 1 to 1 sessions, Unit Group sessions and Medication administration.  Evaluation of Outcomes: Progressing   RN Treatment Plan for Primary Diagnosis: <principal problem not specified> Long Term Goal(s): Knowledge of disease and therapeutic regimen to maintain health will improve  Short Term Goals: Ability to identify and develop effective coping behaviors will improve and Compliance with prescribed medications will improve  Medication Management: RN will administer medications as ordered by provider, will assess and evaluate patient's response and provide education to patient for prescribed medication. RN will report any adverse and/or side effects to prescribing provider.  Therapeutic Interventions: 1 on 1 counseling sessions, Psychoeducation, Medication administration, Evaluate responses to treatment, Monitor vital signs and CBGs as ordered, Perform/monitor CIWA, COWS, AIMS and Fall Risk screenings as ordered, Perform wound care treatments as ordered.  Evaluation of Outcomes: Progressing   LCSW Treatment Plan for Primary Diagnosis: <principal problem not specified> Long Term Goal(s): Safe transition to appropriate next level of care at discharge, Engage patient in therapeutic group addressing interpersonal concerns.  Short Term  Goals: Engage patient in aftercare planning with referrals and resources, Increase social support and Increase skills for wellness and recovery  Therapeutic Interventions: Assess for all discharge needs, 1 to 1 time with Social worker, Explore available resources and support systems, Assess for adequacy in community support network, Educate family and significant other(s) on suicide prevention, Complete Psychosocial Assessment, Interpersonal group therapy.  Evaluation of Outcomes: Progressing   Progress in Treatment: Attending groups: Yes. Participating in groups: Yes. Taking medication as prescribed: Yes. Toleration medication: Yes. Family/Significant other contact made: No, will contact:  husband Patient understands diagnosis: Yes. Discussing patient identified problems/goals with staff: Yes. Medical problems stabilized or resolved: Yes. Denies suicidal/homicidal ideation: Yes. Issues/concerns per patient self-inventory: No. Other: none  New problem(s) identified: No, Describe:  none  New Short Term/Long Term Goal(s):  Patient Goals:  "feel better about myself, learn ways to handle stress"  Discharge Plan or Barriers:   Reason for Continuation of Hospitalization: Depression Medication stabilization  Estimated Length of Stay: 2-4 days.  Attendees: Patient: [name] 09/18/2017   Physician: Dr. Mallie Darting, MD 09/18/2017   Nursing: Baldo Daub, RN 09/18/2017   RN Care Manager: 09/18/2017   Social Worker: Lurline Idol, LCSW 09/18/2017   Recreational Therapist:  09/18/2017   Other:  09/18/2017   Other:  09/18/2017  Other: 09/18/2017        Scribe for Treatment Team: Joanne Chars, [place] 09/18/2017 2:23 PM

## 2017-09-18 NOTE — Progress Notes (Signed)
Recreation Therapy Notes  Date: 9.11.19 Time: 0930 Location: [address] Dayroom  Group Topic: Stress Management  Goal Area(s) Addresses:  Patient will verbalize importance of using healthy stress management.  Patient will identify positive emotions associated with healthy stress management.   Intervention: Stress Management  Activity :  Guided Imagery.  LRT introduced the stress management technique of guided imagery.  LRT read a script for patients to envision seeing a starry sky at night.  Patients were to follow along as script was read to engage in activity.  Education:  Stress Management, Discharge Planning.   Education Outcome: Acknowledges edcuation/In group clarification offered/Needs additional education  Clinical Observations/Feedback: Pt did not attend group.    Victorino Sparrow, LRT/CTRS         Victorino Sparrow A 09/18/2017 12:05 PM

## 2017-09-18 NOTE — Therapy (Signed)
Occupational Therapy Group Note  Date:  09/18/2017 Time:  2:44 PM  Group Topic/Focus:  Self Esteem Action Plan:   The focus of this group is to help patients create a plan to continue to build self-esteem after discharge.  Participation Level:  Active  Participation Quality:  Appropriate  Affect:  Flat  Cognitive:  Appropriate  Insight: Improving  Engagement in Group:  Engaged  Modes of Intervention:  Activity, Discussion, Education and Socialization  Additional Comments:    S: "My body image growing up has affected my self esteem"   O:Education given on self esteem, its definition, and how it becomes negative vs positive. Self esteem education given on its relation to [place]. Pt encouraged to contribute in discussion and brainstorm. Self esteem activity completed where pt is to name a positive word for each letter of the alphabet (A-Z). Positive affirmations worksheet given at end of session for pt to practice and continue building this skill.   A: Pt presents to group with flat affect, engaged and participatory throughout session with redirection. Pt completed A-Z activity with min VC's. Accepted positive affirmations at end of session.   P: Handouts given to facilitate carryover into community.  Zenovia Jarred, MSOT, OTR/L  [place] 09/18/2017, 2:44 PM

## 2017-09-18 NOTE — Progress Notes (Signed)
Patient ID: [name], female   DOB: Mar 09, 1980, 38 y.o.   MRN: [id number]  Patient approached nurse upset after a phone call with her husband. Patient states, "my husband was told I was irritable last night by someone who called him today and I want to know who said that. I wasn't irritable last night I was upset I only got to see my kids for a few minutes". Patient then stated, "can you delete that from my record? I don't want that on my record". Emotional support and reassurance was provided. Patient was informed no information could be removed from the record but that her overall progress would be assessed. Patient educated that staff is required to document all patient behavior and interactions. Patient verbalized understanding. Patient encouraged to bring further concerns to staff. Patient denied further concerns at this time. Will continue to support and monitor.

## 2017-09-18 NOTE — Progress Notes (Signed)
Pt was irritable this evening, focused on the items and belongings that her husband had brought to her at visitation time.  Writer tried a couple of times to talk with her, but she was not open to conversation.  She would only say she was fine without even looking at Probation officer.  She was otherwise cooperative and appropriate.  Pt declined having a sleep aid tonight.  Support and encouragement offered.  Discharge plans are in process.  Safety maintained with q15 minute checks.

## 2017-09-18 NOTE — Progress Notes (Signed)
Adult Psychoeducational Group Note  Date:  09/18/2017 Time:  2:13 AM  Group Topic/Focus:  Wrap-Up Group:   The focus of this group is to help patients review their daily goal of treatment and discuss progress on daily workbooks.  Participation Level:  Active  Participation Quality:  Appropriate  Affect:  Appropriate  Cognitive:  Appropriate  Insight: Improving  Engagement in Group:  Improving  Modes of Intervention:  Discussion  Additional Comments:  Pt rated her over all day a 8 out of 10. Pt goal for today was to have no negative thoughts. Pt stated she accomplished her goal today. Pt stated she attend all groups held today.  Candy Sledge 09/18/2017, 2:13 AM

## 2017-09-18 NOTE — Progress Notes (Signed)
Patient ID: [name], female   DOB: 01-Dec-1980, 37 y.o.   MRN: [id number]  Nursing Progress Note [phone]  Data: Patient presents mildly anxious today but is seen up interacting with her peers in the dayroom and attending group. Patient complaint with scheduled medications. Patient denies pain/physical complaints. Patient completed self-inventory sheet and rates depression, hopelessness, and anxiety 2,1,1 respectively. Patient rates their sleep and appetite as fair/good respectively. Patient states goal for today is to "learn stress relief techniques".  Patient is seen attending groups and visible in the milieu. Patient currently denies SI/HI/AVH.   Action: Patient educated about and provided medication per provider's orders. Patient safety maintained with q15 min safety checks and frequent rounding. Low fall risk precautions in place. Emotional support given. 1:1 interaction and active listening provided. Patient encouraged to attend meals and groups. Patient encouraged to work on treatment plan and goals. Labs, vital signs and patient behavior monitored throughout shift.   Response: Patient remains safe on the unit at this time. Patient is interacting with peers appropriately on the unit. Will continue to support and monitor.

## 2017-09-18 NOTE — [facility] Group Notes (Signed)
[facility] Group Therapy 09/18/2017 1:15pm  Type of Therapy: [facility] Presentation  Participation Level: Active  Participation Quality: Attentive  Affect: Appropriate  Cognitive: Oriented  Insight: Developing/Improving  Engagement in Therapy: Engaged  Modes of Intervention: Discussion, Education and Socialization  Summary of Progress/Problems: [place] Speaker came to talk about his personal journey with mental health. The pt processed ways by which to relate to the speaker. [place] speaker provided handouts and educational information pertaining to groups and services offered by the [facility]. Pt was engaged in speaker's presentation and was receptive to resources provided.    Avelina Laine, LCSW 09/18/2017 9:20 AM

## 2017-09-19 MED ORDER — FLUOXETINE HCL 20 MG PO CAPS: 20.0000 mg | ORAL_CAPSULE | Freq: Every day | ORAL | 0 refills | Status: DC

## 2017-09-19 MED ORDER — BUSPIRONE HCL 10 MG PO TABS: 10.0000 mg | ORAL_TABLET | Freq: Three times a day (TID) | ORAL | 0 refills | Status: DC

## 2017-09-19 NOTE — BHH Suicide Risk Assessment (Signed)
[facility] Discharge Suicide Risk Assessment   Principal Problem: <principal problem not specified> Discharge Diagnoses:  Patient Active Problem List   Diagnosis Date Noted  . Major depressive disorder, recurrent severe without psychotic features ([place]) [F33.2] 09/16/2017  . Neck pain [M54.2] 08/14/2017  . Pre-diabetes [R73.03] 08/14/2017  . Nicotine dependence, cigarettes, uncomplicated [T61.443] 15/40/0867  . Fatigue [R53.83] 11/03/2015  . Allergic rhinitis [J30.9] 05/16/2015  . S/P C-section [Y19.509] 12/18/2014  . Hyperlipidemia [E78.5] 12/16/2014  . 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 Major depressive disorder, recurrent, severe without psychotic features ([place]) [F33.2] 09/23/2014  . GAD (generalized anxiety disorder) [F41.1] 09/23/2014  . Social anxiety disorder [F40.10] 09/23/2014  . Routine general medical examination at a health care facility [Z00.00] 01/07/2014  . OSA on CPAP [G47.33, Z99.89] 11/26/2011  . Morbid obesity ([place]) [E66.01] 05/17/2011  . Chronic pain of right knee [M25.561, G89.29] 02/16/2011  . Depression [F32.9] 02/16/2011  . Hx gestational diabetes [Z86.32] 02/16/2011  . Eczema [L30.9] 02/16/2011    Total Time spent with patient: 15 minutes  Musculoskeletal: Strength & Muscle Tone: within normal limits Gait & Station: normal Patient leans: N/A  Psychiatric Specialty Exam: Review of Systems  All other systems reviewed and are negative.   Blood pressure 109/68, pulse 81, temperature 98.3 F (36.8 C), temperature source Oral, resp. rate 20, height 5\' 4"  (1.626 m), weight 132.5 kg, last menstrual period 09/01/2017, unknown if currently breastfeeding.Body mass index is 50.12 kg/m.  General Appearance: Casual  Eye Contact::  Good  Speech:  Normal Rate409  Volume:  Normal  Mood:  Euthymic  Affect:  Congruent  Thought Process:  Coherent and Descriptions of Associations: Intact  Orientation:  Full (Time, Place, and Person)  Thought Content:   Logical  Suicidal Thoughts:  No  Homicidal Thoughts:  No  Memory:  Immediate;   Fair Recent;   Fair Remote;   Fair  Judgement:  Intact  Insight:  Fair  Psychomotor Activity:  Normal  Concentration:  Good  Recall:  Good  Fund of Knowledge:Good  Language: Good  Akathisia:  Negative  Handed:  Right  AIMS (if indicated):     Assets:  Communication Skills Desire for Improvement Housing Intimacy Physical Health Resilience  Sleep:  Number of Hours: 6.75  Cognition: WNL  ADL's:  Intact   Mental Status Per Nursing Assessment::   On Admission:  Self-harm behaviors  Demographic Factors:  Caucasian  Loss Factors: NA  Historical Factors: Impulsivity  Risk Reduction Factors:   Employed, Living with another person, especially a relative, Positive social support and Positive therapeutic relationship  Continued Clinical Symptoms:  Bipolar Disorder:   Bipolar II  Cognitive Features That Contribute To Risk:  None    Suicide Risk:  Minimal: No identifiable suicidal ideation.  Patients presenting with no risk factors but with morbid ruminations; may be classified as minimal risk based on the severity of the depressive symptoms  Follow-up [place]. Go on 09/26/2017.   Specialty:  Behavioral Health Why:  Hospital follow up appointment is Thursday, 09/26/17 at 9:30am with Dr. Rosalita Chessman. Please be sure to bring any discharge paperwork from this hospitalization, including your list of medications.  Contact information: [place] [phone] [place] [phone]          Plan Of Care/Follow-up recommendations:  Activity:  ad lib  Sharma Covert, MD 09/19/2017, 7:42 AM

## 2017-09-19 NOTE — Progress Notes (Signed)
  [facility] Adult Case Management Discharge Plan :  Will you be returning to the same living situation after discharge:  Yes,  patient reports she is returning home with her husband and children At discharge, do you have transportation home?: Yes,  patient reports her husband will pick her up at discharge Do you have the ability to pay for your medications: Yes,  income from employment, BCBS, support from spouse  Release of information consent forms completed and in the chart;  Patient's signature needed at discharge.  Patient to Follow up at: Follow-up Information    [facility] OUTPATIENT [facility]ERAP[facility] [place]. Go on 09/26/2017.   Specialty:  Behavioral Health Why:  Hospital follow up appointment is Thursday, 09/26/17 at 9:30am with Dr. Rosalita Chessman. Please be sure to bring any discharge paperwork from this hospitalization, including your list of medications.  Contact information: [place] [phone] [place] [phone]          Next level of care provider has access to [place] and Suicide Prevention discussed: Yes,  with the patient's husband  Have you used any form of tobacco in the last 30 days? (Cigarettes, Smokeless Tobacco, Cigars, and/or Pipes): Yes  Has patient been referred to the Quitline?: Patient refused referral  Patient has been referred for addiction treatment: N/A  Marylee Floras, [place] 09/19/2017, 11:34 AM

## 2017-09-19 NOTE — Progress Notes (Signed)
Pt received both written and verbal discharge instructions. Pt verbalized understanding of discharge instructions. Pt agreed to f/u appt and med regimen. Pt received d/c packet and prescriptions. Pt gathered belongings from room and locker. Pt CPAP machine return to pt. Pt safely discharged to the lobby.

## 2017-09-19 NOTE — Discharge Summary (Signed)
Physician Discharge Summary Note  Patient:  [name] is an 37 y.o., female MRN:  [id number] DOB:  April 02, 1980 Patient phone:  [phone] (home)  Patient address:   [place] [address],  Total Time spent with patient: 20 minutes  Date of Admission:  09/16/2017 Date of Discharge: 09/19/17  Reason for Admission:  Worsening depression with attempted overdose  Principal Problem: Major depressive disorder, recurrent severe without psychotic features [facility]) Discharge Diagnoses: Patient Active Problem List   Diagnosis Date Noted  . Major depressive disorder, recurrent severe without psychotic features ([place]) [F33.2] 09/16/2017  . Neck pain [M54.2] 08/14/2017  . Pre-diabetes [R73.03] 08/14/2017  . Nicotine dependence, cigarettes, uncomplicated [A45.364] 68/03/2120  . Fatigue [R53.83] 11/03/2015  . Allergic rhinitis [J30.9] 05/16/2015  . S/P C-section [Q82.500] 12/18/2014  . Hyperlipidemia [E78.5] 12/16/2014  . 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 Major depressive disorder, recurrent, severe without psychotic features ([place]) [F33.2] 09/23/2014  . GAD (generalized anxiety disorder) [F41.1] 09/23/2014  . Social anxiety disorder [F40.10] 09/23/2014  . Routine general medical examination at a health care facility [Z00.00] 01/07/2014  . OSA on CPAP [G47.33, Z99.89] 11/26/2011  . Morbid obesity ([place]) [E66.01] 05/17/2011  . Chronic pain of right knee [M25.561, G89.29] 02/16/2011  . Depression [F32.9] 02/16/2011  . Hx gestational diabetes [Z86.32] 02/16/2011  . Eczema [L30.9] 02/16/2011    Past Psychiatric History: Denied any previous psychiatric admissions. She is being followed by Dr. Doyne Keel in the outpatient clinic.  She has previously been treated with Prozac and BuSpar.  She stated that prior to the event that the Prozac was doing well.  Past Medical History:  Past Medical History:  Diagnosis Date  . Anxiety   . Cluster headaches   . Depression    . Eczema   . GERD (gastroesophageal reflux disease)   . Gestational diabetes 2012, 2016  . Hyperlipidemia   . Hypertension    no meds currently  . Sciatica   . Sleep apnea   . Snoring    sleep study 02/2012 with min AHI events    Past Surgical History:  Procedure Laterality Date  . CESAREAN SECTION  09 & 12   x's 2  . CESAREAN SECTION WITH BILATERAL TUBAL LIGATION Bilateral 12/18/2014   Procedure: REPEAT CESAREAN SECTION WITH BILATERAL TUBAL LIGATION;  Surgeon: Crawford Givens, MD;  Location: [facility];  Service: Obstetrics;  Laterality: Bilateral;  . COLPOSCOPY    . DENTAL SURGERY    . Ganglion removal from (L) wrist  1998  . LAPAROSCOPIC UNILATERAL SALPINGECTOMY Left 01/26/2015   Procedure: LAPAROSCOPIC UNILATERAL SALPINGECTOMY;  Surgeon: Crawford Givens, MD;  Location: [facility];  Service: Gynecology;  Laterality: Left;  . WISDOM TOOTH EXTRACTION     Family History:  Family History  Problem Relation Age of Onset  . Arthritis Mother   . Alcohol abuse Mother   . Mental illness Mother   . Cancer Mother 67       squamous - unknown primary  . Coronary artery disease Mother 93       MI with stent  . Anxiety disorder Mother   . Depression Mother   . Arthritis Father   . Alcohol abuse Father   . Hyperlipidemia Father   . Heart disease Father   . Kidney disease Father   . Stroke Father   . Mental illness Father   . Drug abuse Father   . Diabetes Maternal Grandmother   . Heart disease Maternal Grandmother   .  Stroke Maternal Grandfather   . Hypertension Paternal Grandfather   . Hyperlipidemia Paternal Grandfather   . Arthritis Other   . Alcohol abuse Other   . Breast cancer Paternal Aunt   . Anxiety disorder Sister    Family Psychiatric  History: Father has a history of crack dependence.  Her mother has depression and anxiety, her sister has anxiety. Social History:  Social History   Substance and Sexual Activity  Alcohol Use Yes   Comment: once a month or less     Social  History   Substance and Sexual Activity  Drug Use No    Social History   Socioeconomic History  . Marital status: Married    Spouse name: Not on file  . Number of children: 3  . Years of education: 49  . Highest education level: Not on file  Occupational History  . Occupation: Group Transport planner  . Financial resource strain: Not on file  . Food insecurity:    Worry: Not on file    Inability: Not on file  . Transportation needs:    Medical: Not on file    Non-medical: Not on file  Tobacco Use  . Smoking status: Current Every Day Smoker    Packs/day: 0.30    Years: 15.00    Pack years: 4.50    Types: Cigarettes    Last attempt to quit: 06/24/2016    Years since quitting: 1.2  . Smokeless tobacco: Never Used  . Tobacco comment: Reports started back and trying ot quit again  Substance and Sexual Activity  . Alcohol use: Yes    Comment: once a month or less  . Drug use: No  . Sexual activity: Yes    Partners: Male    Birth control/protection: None  Lifestyle  . Physical activity:    Days per week: Not on file    Minutes per session: Not on file  . Stress: Not on file  Relationships  . Social connections:    Talks on phone: Not on file    Gets together: Not on file    Attends religious service: Not on file    Active member of club or organization: Not on file    Attends meetings of clubs or organizations: Not on file    Relationship status: Not on file  Other Topics Concern  . Not on file  Social History Narrative   Secretary   Married, lives with spouse and 2 kids   Moved to [facility] summer 2011 to be near mom - originally from [place]   Denies abuse and feels safe at home.    Hospital Course:   09/17/17 [facility] MD Assessment: 37 year old female whowas admitted to the psychiatric facility secondary to an attempted overdoseof20 tablets of fluoxetine 20 mg each. She originally presented to the [facility] emergency room after calling EMS and  attempting to vomit the pills up. Patient reported that she had suicidal thoughts secondary to several psychosocial stressors including financial problems, continued arguments with her husband, and guilt of not allowing her father who is a cocaine addict to move in with her home. She reported to her husband prior to the overdose that she would take the kids and leave him, then realized her husband was good for the kids and came up with the idea that they would be better off without her. She is followed by Dr. Doyne Keel at [facility] behavioral health. She had an appointment with her therapist on the date  of admission. She admitted to helplessness, hopelessness and worthlessness. He was admitted to the hospital for evaluation and stabilization.  Patient remained on the [name] unit for 2 days. The patient stabilized on medication and therapy. Patient was discharged on Buspar 10 mg TID, Prozac 20 mg Daily. Patient has shown improvement with improved mood, affect, sleep, appetite, and interaction. Patient has attended group and participated. Patient has been seen in the day room interacting with peers and staff appropriately. Patient denies any SI/HI/AVH and contracts for safety. Patient agrees to follow up at [facility]. Patient is provided with prescriptions for their medications upon discharge.   Physical Findings: AIMS: Facial and Oral Movements Muscles of Facial Expression: None, normal Lips and Perioral Area: None, normal Jaw: None, normal Tongue: None, normal,Extremity Movements Upper (arms, wrists, hands, fingers): None, normal Lower (legs, knees, ankles, toes): None, normal, Trunk Movements Neck, shoulders, hips: None, normal, Overall Severity Severity of abnormal movements (highest score from questions above): None, normal Incapacitation due to abnormal movements: None, normal Patient's awareness of abnormal movements (rate only patient's report): No Awareness, Dental  Status Current problems with teeth and/or dentures?: No Does patient usually wear dentures?: No  CIWA:    COWS:     Musculoskeletal: Strength & Muscle Tone: within normal limits Gait & Station: normal Patient leans: N/A  Psychiatric Specialty Exam: Physical Exam  Nursing note and vitals reviewed. Constitutional: She is oriented to person, place, and time. She appears well-developed and well-nourished.  Cardiovascular: Normal rate.  Respiratory: Effort normal.  Musculoskeletal: Normal range of motion.  Neurological: She is alert and oriented to person, place, and time.  Skin: Skin is warm.    Review of Systems  Constitutional: Negative.   HENT: Negative.   Eyes: Negative.   Respiratory: Negative.   Cardiovascular: Negative.   Gastrointestinal: Negative.   Genitourinary: Negative.   Musculoskeletal: Negative.   Skin: Negative.   Neurological: Negative.   Endo/Heme/Allergies: Negative.   Psychiatric/Behavioral: Negative.     Blood pressure 109/68, pulse 81, temperature 98.3 F (36.8 C), temperature source Oral, resp. rate 20, height 5\' 4"  (1.626 m), weight 132.5 kg, last menstrual period 09/01/2017, unknown if currently breastfeeding.Body mass index is 50.12 kg/m.  General Appearance: Casual  Eye Contact:  Good  Speech:  Clear and Coherent and Normal Rate  Volume:  Normal  Mood:  Euthymic  Affect:  Congruent  Thought Process:  Goal Directed and Descriptions of Associations: Intact  Orientation:  Full (Time, Place, and Person)  Thought Content:  WDL  Suicidal Thoughts:  No  Homicidal Thoughts:  No  Memory:  Immediate;   Good Recent;   Good Remote;   Good  Judgement:  Fair  Insight:  Fair  Psychomotor Activity:  Normal  Concentration:  Concentration: Good and Attention Span: Good  Recall:  Good  Fund of Knowledge:  Good  Language:  Good  Akathisia:  No  Handed:  Right  AIMS (if indicated):     Assets:  Communication Skills Desire for Improvement Financial  Resources/Insurance Housing Physical Health Social Support Transportation  ADL's:  Intact  Cognition:  WNL  Sleep:  Number of Hours: 6.75     Have you used any form of tobacco in the last 30 days? (Cigarettes, Smokeless Tobacco, Cigars, and/or Pipes): Yes  Has this patient used any form of tobacco in the last 30 days? (Cigarettes, Smokeless Tobacco, Cigars, and/or Pipes) Yes, Yes, A prescription for an FDA-approved tobacco cessation medication was offered at discharge and  the patient refused  Blood Alcohol level:  Lab Results  Component Value Date   ETH <10 09/15/2017   ETH <5 30/86/5784    Metabolic Disorder Labs:  Lab Results  Component Value Date   HGBA1C 5.9 08/14/2017   No results found for: PROLACTIN Lab Results  Component Value Date   CHOL 192 08/14/2017   TRIG 150.0 (H) 08/14/2017   HDL 33.90 (L) 08/14/2017   CHOLHDL 6 08/14/2017   VLDL 30.0 08/14/2017   LDLCALC 128 (H) 08/14/2017   LDLCALC 152 (H) 02/09/2015    See Psychiatric Specialty Exam and Suicide Risk Assessment completed by Attending Physician prior to discharge.  Discharge destination:  Home  Is patient on multiple antipsychotic therapies at discharge:  No   Has Patient had three or more failed trials of antipsychotic monotherapy by history:  No  Recommended Plan for Multiple Antipsychotic Therapies: NA   Allergies as of 09/19/2017      Reactions   Codeine    STOMACH CRAMPING   Doxycycline Nausea And Vomiting      Medication List    STOP taking these medications   cyclobenzaprine 10 MG tablet Commonly known as:  FLEXERIL   ibuprofen 600 MG tablet Commonly known as:  ADVIL,MOTRIN     TAKE these medications     Indication  busPIRone 10 MG tablet Commonly known as:  BUSPAR Take 1 tablet (10 mg total) by mouth 3 (three) times daily. For mood control What changed:    medication strength  how much to take  when to take this  additional instructions  Indication:  mood  stability   FLUoxetine 20 MG capsule Commonly known as:  PROZAC Take 1 capsule (20 mg total) by mouth daily. Mood control What changed:    how much to take  additional instructions  Indication:  mood stability      Follow-up Information    [place]. Go on 09/26/2017.   Specialty:  Behavioral Health Why:  Hospital follow up appointment is Thursday, 09/26/17 at 9:30am with Dr. Rosalita Chessman. Please be sure to bring any discharge paperwork from this hospitalization, including your list of medications.  Contact information: [place] [phone] [place] [phone]          Follow-up recommendations:  Continue activity as tolerated. Continue diet as recommended by your PCP. Ensure to keep all appointments with outpatient providers.  Comments:  Patient is instructed prior to discharge to: Take all medications as prescribed by his/her mental healthcare provider. Report any adverse effects and or reactions from the medicines to his/her outpatient provider promptly. Patient has been instructed & cautioned: To not engage in alcohol and or illegal drug use while on prescription medicines. In the event of worsening symptoms, patient is instructed to call the crisis hotline, 911 and or go to the nearest ED for appropriate evaluation and treatment of symptoms. To follow-up with his/her primary care provider for your other medical issues, concerns and or health care needs.    Signed: Lowry Ram Money, FNP 09/19/2017, 8:15 AM

## 2017-09-19 NOTE — Progress Notes (Signed)
The patient shared in group last evening that she accomplished her goal for the day which was to reduced her stress level. Her goal for tomorrow is to make up a list of coping skills that she can use when she gets discharged.

## 2017-09-26 ENCOUNTER — Ambulatory Visit (INDEPENDENT_AMBULATORY_CARE_PROVIDER_SITE_OTHER): Payer: Federal, State, Local not specified - PPO | Admitting: Psychiatry

## 2017-09-26 ENCOUNTER — Encounter (HOSPITAL_COMMUNITY): Payer: Self-pay | Admitting: Psychiatry

## 2017-09-26 VITALS — BP 113/75 | HR 74 | Ht 62.5 in | Wt 292.0 lb

## 2017-09-26 DIAGNOSIS — F4010 Social phobia, unspecified: Secondary | ICD-10-CM

## 2017-09-26 DIAGNOSIS — F411 Generalized anxiety disorder: Secondary | ICD-10-CM | POA: Diagnosis not present

## 2017-09-26 DIAGNOSIS — F332 Major depressive disorder, recurrent severe without psychotic features: Secondary | ICD-10-CM | POA: Diagnosis not present

## 2017-09-26 DIAGNOSIS — F17210 Nicotine dependence, cigarettes, uncomplicated: Secondary | ICD-10-CM

## 2017-09-26 MED ORDER — BUPROPION HCL ER (XL) 150 MG PO TB24: 150.0000 mg | ORAL_TABLET | Freq: Every day | ORAL | 0 refills | Status: DC

## 2017-09-26 NOTE — Progress Notes (Signed)
[place] MD/PA/NP OP Progress Note  09/26/2017 9:48 AM [name]  MRN:  [id number]  Chief Complaint:  Chief Complaint    Hospitalization Follow-up     HPI: Patient was admitted to [facility] inpatient psychiatric unit on 9 9 and discharged on 09/19/2017.  Reason for admission was worsening depression and anxiety with attempted suicide by overdose.  Today patient states that she was feeling extremely overwhelmed and anxious.  She was not sure how to handle her anxiety and it led to worsening depression symptoms.  At first patient threatened to leave her husband but stated that she did not really want to do that.  Then she decided it would be better if she was not around anymore.  She went into her room and impulsively took 15 tablets of Prozac.  Afterwards she tried to throw up but was unable to do so so she called 911 and was taken to the emergency room.  The patient states that it was a mistake.  Since discharge she has been feeling better.  Her anxiety is improved and she is no longer feeling overwhelmed since starting the BuSpar 10 mg 3 times a day.  She states she is feeling a lot calmer.  Her depression is also improved and she is no longer suicidal.  She has been taking 60 mg of the Prozac as it was prior to her admission.  She is sleeping well.  She is trying to engage in some self-care but states it is hard with all of her family.  She denies HI.  She is reporting that she smokes about 1 pack of cigarettes every 3 days and really wants to quit.  She is requesting a prescription of Wellbutrin to assist with that.  She is attempted to quit many times in the past and states that she always goes back to it.    Visit Diagnosis:    ICD-10-CM   1. Severe episode of recurrent major depressive disorder, without psychotic features ([place]) F33.2 buPROPion (WELLBUTRIN XL) 150 MG 24 hr tablet  2. GAD (generalized anxiety disorder) F41.1   3. Social anxiety disorder F40.10   4. Tobacco dependence due to  cigarettes F17.210 buPROPion (WELLBUTRIN XL) 150 MG 24 hr tablet       Past Psychiatric History:  Hospitalizations-[facility] inpatient psychiatric unit from 09/16/2017 through 09/19/2017 for worsening depression and suicide attempt by overdose SIB/SA-had SI with plans in the past, impulsively attempted suicide by overdose on Prozac in September 2019 ; denies SIB Meds: Paxil- effective but stopped during pregnancy, Celexa-ineffective, Zoloft-ineffective   Past Medical History:  Past Medical History:  Diagnosis Date  . Anxiety   . Cluster headaches   . Depression   . Eczema   . GERD (gastroesophageal reflux disease)   . Gestational diabetes 2012, 2016  . Hyperlipidemia   . Hypertension    no meds currently  . Sciatica   . Sleep apnea   . Snoring    sleep study 02/2012 with min AHI events    Past Surgical History:  Procedure Laterality Date  . CESAREAN SECTION  09 & 12   x's 2  . CESAREAN SECTION WITH BILATERAL TUBAL LIGATION Bilateral 12/18/2014   Procedure: REPEAT CESAREAN SECTION WITH BILATERAL TUBAL LIGATION;  Surgeon: Crawford Givens, MD;  Location: [facility];  Service: Obstetrics;  Laterality: Bilateral;  . COLPOSCOPY    . DENTAL SURGERY    . Ganglion removal from (L) wrist  1998  . LAPAROSCOPIC UNILATERAL SALPINGECTOMY Left 01/26/2015  Procedure: LAPAROSCOPIC UNILATERAL SALPINGECTOMY;  Surgeon: Crawford Givens, MD;  Location: [facility];  Service: Gynecology;  Laterality: Left;  . WISDOM TOOTH EXTRACTION      Family Psychiatric and Medical History:  Family History  Problem Relation Age of Onset  . Arthritis Mother   . Alcohol abuse Mother   . Mental illness Mother   . Cancer Mother 71       squamous - unknown primary  . Coronary artery disease Mother 31       MI with stent  . Anxiety disorder Mother   . Depression Mother   . Arthritis Father   . Alcohol abuse Father   . Hyperlipidemia Father   . Heart disease Father   . Kidney disease Father   . Stroke Father    . Mental illness Father   . Drug abuse Father   . Diabetes Maternal Grandmother   . Heart disease Maternal Grandmother   . Stroke Maternal Grandfather   . Hypertension Paternal Grandfather   . Hyperlipidemia Paternal Grandfather   . Arthritis Other   . Alcohol abuse Other   . Breast cancer Paternal Aunt   . Anxiety disorder Sister     Social History:  Social History   Socioeconomic History  . Marital status: Married    Spouse name: Not on file  . Number of children: 3  . Years of education: 57  . Highest education level: Not on file  Occupational History  . Occupation: Group Transport planner  . Financial resource strain: Not on file  . Food insecurity:    Worry: Not on file    Inability: Not on file  . Transportation needs:    Medical: Not on file    Non-medical: Not on file  Tobacco Use  . Smoking status: Current Every Day Smoker    Packs/day: 0.30    Years: 15.00    Pack years: 4.50    Types: Cigarettes    Last attempt to quit: 06/24/2016    Years since quitting: 1.2  . Smokeless tobacco: Never Used  . Tobacco comment: Reports started back and trying ot quit again  Substance and Sexual Activity  . Alcohol use: Yes    Comment: once a month or less  . Drug use: No  . Sexual activity: Yes    Partners: Male    Birth control/protection: None  Lifestyle  . Physical activity:    Days per week: Not on file    Minutes per session: Not on file  . Stress: Not on file  Relationships  . Social connections:    Talks on phone: Not on file    Gets together: Not on file    Attends religious service: Not on file    Active member of club or organization: Not on file    Attends meetings of clubs or organizations: Not on file    Relationship status: Not on file  Other Topics Concern  . Not on file  Social History Narrative   Secretary   Married, lives with spouse and 2 kids   Moved to [facility] summer 2011 to be near mom - originally from [place]   Denies abuse and feels  safe at home.    Allergies:  Allergies  Allergen Reactions  . Codeine     STOMACH CRAMPING  . Doxycycline Nausea And Vomiting    Metabolic Disorder Labs: Lab Results  Component Value Date   HGBA1C 5.9 08/14/2017   No results found for: PROLACTIN  Lab Results  Component Value Date   CHOL 192 08/14/2017   TRIG 150.0 (H) 08/14/2017   HDL 33.90 (L) 08/14/2017   CHOLHDL 6 08/14/2017   VLDL 30.0 08/14/2017   LDLCALC 128 (H) 08/14/2017   LDLCALC 152 (H) 02/09/2015   Lab Results  Component Value Date   TSH 2.51 08/14/2017   TSH 1.34 11/03/2015    Therapeutic Level Labs: No results found for: LITHIUM No results found for: VALPROATE No components found for:  CBMZ  Current Medications: Current Outpatient Medications  Medication Sig Dispense Refill  . busPIRone (BUSPAR) 10 MG tablet Take 1 tablet (10 mg total) by mouth 3 (three) times daily. For mood control 90 tablet 0  . FLUoxetine (PROZAC) 20 MG capsule Take 1 capsule (20 mg total) by mouth daily. Mood control 30 capsule 0  . buPROPion (WELLBUTRIN XL) 150 MG 24 hr tablet Take 1 tablet (150 mg total) by mouth daily. 30 tablet 0   No current facility-administered medications for this visit.      Musculoskeletal: Strength & Muscle Tone: within normal limits Gait & Station: normal Patient leans: N/A  Psychiatric Specialty Exam: Review of Systems  Constitutional: Negative for chills, diaphoresis and fever.  Respiratory: Negative for cough, sputum production and wheezing.     Blood pressure 113/75, pulse 74, height 5' 2.5" (1.588 m), weight 292 lb (132.5 kg), last menstrual period 09/01/2017, unknown if currently breastfeeding.Body mass index is 52.56 kg/m.  General Appearance: Casual  Eye Contact:  Good  Speech:  Clear and Coherent and Normal Rate  Volume:  Normal  Mood:  Anxious and Depressed  Affect:  Congruent- more anxious than depressed  Thought Process:  Goal Directed and Descriptions of Associations: Intact   Orientation:  Full (Time, Place, and Person)  Thought Content:  Logical  Suicidal Thoughts:  No  Homicidal Thoughts:  No  Memory:  Immediate;   Good Recent;   Good Remote;   Good  Judgement:  Good  Insight:  Good  Psychomotor Activity:  Normal  Concentration:  Concentration: Good and Attention Span: Good  Recall:  Good  Fund of Knowledge:  Good  Language:  Good  Akathisia:  No  Handed:  Right  AIMS (if indicated):     Assets:  Communication Skills Desire for Improvement Financial Resources/Insurance Housing Intimacy Social Support Transportation Vocational/Educational  ADL's:  Intact  Cognition:  WNL  Sleep:   good     Screenings: AIMS     Admission (Discharged) from 09/16/2017 in [facility]  AIMS Total Score  0    AUDIT     Admission (Discharged) from 09/16/2017 in [facility]  Alcohol Use Disorder Identification Test Final Score (AUDIT)  3    PHQ2-9     Office Visit from 02/09/2015 in [facility]  PHQ-2 Total Score  2  1  PHQ-9 Total Score  5  -      I reviewed the information below on 09/26/2017 and have updated it Assessment and Plan: MDD-recurrent, severe; GAD; social anxiety disorder; Tobacco dependence    Medication management with supportive therapy. Risks and benefits, side effects and alternative treatment options discussed with patient. Pt was given an opportunity to ask questions about medication, illness, and treatment. All current psychiatric medications have been reviewed and discussed with the patient and adjusted as clinically appropriate. The patient has been provided an accurate and  updated list of the medications being now prescribed. Patient expressed understanding of how their medications were to be used.  Pt verbalized understanding and verbal consent obtained for  treatment.  Status of current problems: GAD and MDD are improving  Meds: Buspar 10mg  po TID for GAD  Increase Prozac 60mg  p.o. daily for MDD, GAD, SAD Start trial of Wellbutrin XL 150mg  po qD for mood and smoking cessation   Labs:  Reviewed labs done on 09/15/2017-CMP shows glucose of 126 otherwise normal, CBC within normal limits, pregnancy test was negative, UDS was negative EKG shows QTC of 453, normal sinus rhythm  Reviewed labs done on 08/14/2017-CMP shows a glucose of 105 but is otherwise normal; LDL is 128 and triglycerides are 150; CBC is within normal limits, hemoglobin A1c is 5.9; TSH is 2.51  Therapy: brief supportive therapy provided. Discussed psychosocial stressors in detail.     Consultations: Encouraged to follow up with therapist- she has an appt tomorrow Encouraged to follow up with PCP as needed  Pt denies SI and is at an acute low risk for suicide at this time. Her chronic risk factors are hx of impulsive suicide attempt. Patient told to call clinic if any problems occur. Patient advised to go to ER if they should develop SI/HI, side effects, or if symptoms worsen. Has crisis numbers to call if needed. Pt verbalized understanding.  F/up in 1 months or sooner if needed      Charlcie Cradle, MD 09/26/2017, 9:48 AM

## 2017-09-30 ENCOUNTER — Ambulatory Visit (INDEPENDENT_AMBULATORY_CARE_PROVIDER_SITE_OTHER): Payer: Federal, State, Local not specified - PPO | Admitting: Psychology

## 2017-09-30 DIAGNOSIS — F331 Major depressive disorder, recurrent, moderate: Secondary | ICD-10-CM | POA: Diagnosis not present

## 2017-09-30 DIAGNOSIS — F411 Generalized anxiety disorder: Secondary | ICD-10-CM

## 2017-09-30 NOTE — Progress Notes (Signed)
   THERAPIST PROGRESS NOTE  Session Time: 12.39pm-1.30pm  Participation Level: Active  Behavioral Response: Well GroomedAlertaffect wnl- stressed  Type of Therapy: Individual Therapy  Treatment Goals addressed: Diagnosis: GAD, MDD and goal 1.  Interventions: CBT and Supportive  Summary: VENUS GILLES is a 37 y.o. female who presents with affect wnl.  Pt reports a lot happening since last visit.  pt discussed stressors leading up to inpt tx.  Pt reported that they had stressor of almost being evicted, her birthday (not able to celebrate), wanting gift of time to self and husband wanting to spend as family together, father asking to stay w/ them as feared for life w/ owning money to someone.  Pt reported that this all escalated to argument w/ husband- saying things didn't mean- going to leave w/ kids and then feeling like maybe better if not here (alive).  Pt reported that she regretted immediately taking prozac so sought medical help. Pt reported increased awareness of things in hospital- needing to severe relationship w/ dad, increased awareness of anxiety and not managing and guilt experiences when tries to attend to self.  pt discussed need to take time for self but also discipline to reengage w/ day to day so that doesn't become overwhelmed.  Pt discussed lacks motivation currently and responsibilities around the house building up. Pt reported 2 days after home from inpt husband manic and went inpt. Pt reports that she hasn't had any SI, anxiety is improved- pt aware that stressors are still present- aren't going away and need to approach differently.  Suicidal/Homicidal: Nowithout intent/plan  Therapist Response: Assessed pt current functioning per pt report.  Processed w/ pt stressors and contributing factors.  Explored w/pt her self care, her routines and areas for change and what not in her control.  Reflected increased self awareness.   Plan: Return again in 2  weeks.  Diagnosis: MDD, GAD   [name], LPC 09/30/2017

## 2017-10-02 ENCOUNTER — Ambulatory Visit: Payer: Self-pay

## 2017-10-03 ENCOUNTER — Ambulatory Visit (INDEPENDENT_AMBULATORY_CARE_PROVIDER_SITE_OTHER): Payer: Federal, State, Local not specified - PPO

## 2017-10-03 DIAGNOSIS — Z23 Encounter for immunization: Secondary | ICD-10-CM | POA: Diagnosis not present

## 2017-10-10 ENCOUNTER — Ambulatory Visit (HOSPITAL_COMMUNITY): Payer: Self-pay | Admitting: Psychiatry

## 2017-10-14 ENCOUNTER — Ambulatory Visit (HOSPITAL_COMMUNITY): Payer: Self-pay | Admitting: Psychology

## 2017-10-17 ENCOUNTER — Encounter (HOSPITAL_COMMUNITY): Payer: Self-pay | Admitting: Psychiatry

## 2017-10-17 ENCOUNTER — Ambulatory Visit (HOSPITAL_COMMUNITY): Payer: Federal, State, Local not specified - PPO | Admitting: Psychiatry

## 2017-10-17 VITALS — BP 130/78 | Ht 63.0 in | Wt 290.0 lb

## 2017-10-17 DIAGNOSIS — F411 Generalized anxiety disorder: Secondary | ICD-10-CM

## 2017-10-17 DIAGNOSIS — F332 Major depressive disorder, recurrent severe without psychotic features: Secondary | ICD-10-CM | POA: Diagnosis not present

## 2017-10-17 DIAGNOSIS — F17210 Nicotine dependence, cigarettes, uncomplicated: Secondary | ICD-10-CM | POA: Diagnosis not present

## 2017-10-17 MED ORDER — BUSPIRONE HCL 10 MG PO TABS: 10.0000 mg | ORAL_TABLET | Freq: Three times a day (TID) | ORAL | 0 refills | Status: DC

## 2017-10-17 MED ORDER — FLUOXETINE HCL 20 MG PO CAPS: 60.0000 mg | ORAL_CAPSULE | Freq: Every day | ORAL | 0 refills | Status: DC

## 2017-10-17 MED ORDER — BUPROPION HCL ER (XL) 150 MG PO TB24: 150.0000 mg | ORAL_TABLET | Freq: Every day | ORAL | 0 refills | Status: DC

## 2017-10-17 NOTE — Progress Notes (Signed)
[facility] MD/PA/NP OP Progress Note  10/17/2017 8:36 AM RESHA FILIPPONE  MRN:  [id number]  Chief Complaint:  Chief Complaint    Nicotine Dependence; Anxiety; Depression; Follow-up     HPI:   Patient reports that she is doing well.  She has had a number of joyful events happen recently.  Patient is moving into a new home in November.  She is very excited as the house is nice and affordable.  Her work is going well.  Her kids are doing well and she is getting along well with her husband.  Work continues and is less stressful.  She does like the work that she does.  She notes that since starting Wellbutrin she no longer has cravings for cigarettes.  She smokes 1 cigarette a week in certain social situations.  Even that cigarette she does not finish.  She also notes that she is lost a few pounds as her appetite is decreased since starting the Wellbutrin.  Her mood is good and she is not really feeling depressed.  She denies any crying or anhedonia.  She denies isolation.  She states that her sleep is fair but her dreams are very vivid.  Energy is generally on the low side.  Ioma denies SI/HI.  She states her anxiety is mild and manageable.  Overall she feels she is doing well and is happy with the positive changes that are going on.  Patient is taking her medication as prescribed and is denying any side effects.     Visit Diagnosis:    ICD-10-CM   1. GAD (generalized anxiety disorder) F41.1 FLUoxetine (PROZAC) 20 MG capsule    busPIRone (BUSPAR) 10 MG tablet    DISCONTINUED: busPIRone (BUSPAR) 10 MG tablet  2. Severe episode of recurrent major depressive disorder, without psychotic features ([place]) F33.2 buPROPion (WELLBUTRIN XL) 150 MG 24 hr tablet    FLUoxetine (PROZAC) 20 MG capsule  3. Tobacco dependence due to cigarettes F17.210 buPROPion (WELLBUTRIN XL) 150 MG 24 hr tablet       Past Psychiatric History:  Hospitalizations-[facility] inpatient psychiatric unit from 09/16/2017 through  09/19/2017 for worsening depression and suicide attempt by overdose SIB/SA-had SI with plans in the past, impulsively attempted suicide by overdose on Prozac in September 2019 ; denies SIB Meds: Paxil- effective but stopped during pregnancy, Celexa-ineffective, Zoloft-ineffective   Past Medical History:  Past Medical History:  Diagnosis Date  . Anxiety   . Cluster headaches   . Depression   . Eczema   . GERD (gastroesophageal reflux disease)   . Gestational diabetes 2012, 2016  . Hyperlipidemia   . Hypertension    no meds currently  . Sciatica   . Sleep apnea   . Snoring    sleep study 02/2012 with min AHI events    Past Surgical History:  Procedure Laterality Date  . CESAREAN SECTION  09 & 11   x's 2  . CESAREAN SECTION WITH BILATERAL TUBAL LIGATION Bilateral 12/18/2014   Procedure: REPEAT CESAREAN SECTION WITH BILATERAL TUBAL LIGATION;  Surgeon: Crawford Givens, MD;  Location: [facility];  Service: Obstetrics;  Laterality: Bilateral;  . COLPOSCOPY    . DENTAL SURGERY    . Ganglion removal from (L) wrist  1998  . LAPAROSCOPIC UNILATERAL SALPINGECTOMY Left 01/26/2015   Procedure: LAPAROSCOPIC UNILATERAL SALPINGECTOMY;  Surgeon: Crawford Givens, MD;  Location: [facility];  Service: Gynecology;  Laterality: Left;  . WISDOM TOOTH EXTRACTION      Family Psychiatric and Medical History:  Family  History  Problem Relation Age of Onset  . Arthritis Mother   . Alcohol abuse Mother   . Mental illness Mother   . Cancer Mother 33       squamous - unknown primary  . Coronary artery disease Mother 17       MI with stent  . Anxiety disorder Mother   . Depression Mother   . Arthritis Father   . Alcohol abuse Father   . Hyperlipidemia Father   . Heart disease Father   . Kidney disease Father   . Stroke Father   . Mental illness Father   . Drug abuse Father   . Diabetes Maternal Grandmother   . Heart disease Maternal Grandmother   . Stroke Maternal Grandfather   . Hypertension Paternal  Grandfather   . Hyperlipidemia Paternal Grandfather   . Arthritis Other   . Alcohol abuse Other   . Breast cancer Paternal Aunt   . Anxiety disorder Sister     Social History:  Social History   Socioeconomic History  . Marital status: Married    Spouse name: Not on file  . Number of children: 3  . Years of education: 61  . Highest education level: Not on file  Occupational History  . Occupation: Group Transport planner  . Financial resource strain: Not on file  . Food insecurity:    Worry: Not on file    Inability: Not on file  . Transportation needs:    Medical: Not on file    Non-medical: Not on file  Tobacco Use  . Smoking status: Former Smoker    Packs/day: 0.30    Years: 15.00    Pack years: 4.50    Types: Cigarettes    Last attempt to quit: 10/08/2016    Years since quitting: 1.0  . Smokeless tobacco: Never Used  . Tobacco comment: Patient just quit  Substance and Sexual Activity  . Alcohol use: Yes    Comment: once a month or less  . Drug use: No  . Sexual activity: Yes    Partners: Male    Birth control/protection: None  Lifestyle  . Physical activity:    Days per week: Not on file    Minutes per session: Not on file  . Stress: Not on file  Relationships  . Social connections:    Talks on phone: Not on file    Gets together: Not on file    Attends religious service: Not on file    Active member of club or organization: Not on file    Attends meetings of clubs or organizations: Not on file    Relationship status: Not on file  Other Topics Concern  . 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    Allergies:  Allergies  Allergen Reactions  . Codeine     STOMACH CRAMPING  . Doxycycline Nausea And Vomiting    Metabolic Disorder Labs: Lab Results  Component Value Date   HGBA1C 5.9  08/14/2017   No results found for: PROLACTIN Lab Results  Component Value Date   CHOL 192 08/14/2017   TRIG 150.0 (H) 08/14/2017   HDL 33.90 (L) 08/14/2017   CHOLHDL 6 08/14/2017   VLDL 30.0 08/14/2017   LDLCALC 128 (H) 08/14/2017   LDLCALC 152 (  H) 02/09/2015   Lab Results  Component Value Date   TSH 2.51 08/14/2017   TSH 1.34 11/03/2015    Therapeutic Level Labs: No results found for: LITHIUM No results found for: VALPROATE No components found for:  CBMZ  Current Medications: Current Outpatient Medications  Medication Sig Dispense Refill  . buPROPion (WELLBUTRIN XL) 150 MG 24 hr tablet Take 1 tablet (150 mg total) by mouth daily. 90 tablet 0  . busPIRone (BUSPAR) 10 MG tablet Take 1 tablet (10 mg total) by mouth 3 (three) times daily. For mood control 270 tablet 0  . FLUoxetine (PROZAC) 20 MG capsule Take 3 capsules (60 mg total) by mouth daily. Mood control 270 capsule 0   No current facility-administered medications for this visit.      Musculoskeletal: Strength & Muscle Tone: within normal limits Gait & Station: normal Patient leans: N/A  Psychiatric Specialty Exam: Review of Systems  Constitutional: Positive for malaise/fatigue. Negative for chills and fever.  Respiratory: Negative for cough, sputum production and wheezing.     Blood pressure 130/78, height 5\' 3"  (1.6 m), weight 290 lb (131.5 kg), unknown if currently breastfeeding.Body mass index is 51.37 kg/m.  General Appearance: Casual  Eye Contact:  Good  Speech:  Clear and Coherent and Normal Rate  Volume:  Normal  Mood:  Euthymic  Affect:  Full Range  Thought Process:  Goal Directed and Descriptions of Associations: Intact  Orientation:  Full (Time, Place, and Person)  Thought Content:  Logical  Suicidal Thoughts:  No  Homicidal Thoughts:  No  Memory:  Immediate;   Good  Judgement:  Good  Insight:  Good  Psychomotor Activity:  Normal  Concentration:  Concentration: Good  Recall:  Good  Fund  of Knowledge:  Good  Language:  Good  Akathisia:  No  Handed:  Right  AIMS (if indicated):     Assets:  Communication Skills Desire for Improvement Financial Resources/Insurance Housing Intimacy Leisure Time Resilience Social Support Talents/Skills Transportation Vocational/Educational  ADL's:  Intact  Cognition:  WNL  Sleep:   fair       Screenings: AIMS     Admission (Discharged) from 09/16/2017 in [facility]  AIMS Total Score  0    AUDIT     Admission (Discharged) from 09/16/2017 in [facility]  Alcohol Use Disorder Identification Test Final Score (AUDIT)  3    PHQ2-9     Office Visit from 02/09/2015 in [facility]  PHQ-2 Total Score  2  1  PHQ-9 Total Score  5  -      I reviewed the information below on 10/17/2017 and have updated it Assessment and Plan: MDD-recurrent, severe; GAD; social anxiety disorder; Tobacco dependence   Medication management with supportive therapy. Risks and benefits, side effects and alternative treatment options discussed with patient. Pt was given an opportunity to ask questions about medication, illness, and treatment. All current psychiatric medications have been reviewed and discussed with the patient and adjusted as clinically appropriate. The patient has been provided an accurate and updated list of the medications being now prescribed. Patient expressed understanding of how their medications were to be used.  Pt verbalized understanding and verbal consent obtained for treatment.  Status of current problems: GAD and MDD are improving and she is only smoking 1 cig/week  Meds: Buspar 10mg  po TID for GAD  Prozac 60mg  p.o. daily  for MDD, GAD, SAD  Wellbutrin XL 150mg  po qD for mood and smoking cessation   Labs:  none  Reviewed labs done on 09/15/2017-CMP shows glucose of  126 otherwise normal, CBC within normal limits, pregnancy test was negative, UDS was negative EKG shows QTC of 453, normal sinus rhythm  Reviewed labs done on 08/14/2017-CMP shows a glucose of 105 but is otherwise normal; LDL is 128 and triglycerides are 150; CBC is within normal limits, hemoglobin A1c is 5.9; TSH is 2.51  Therapy: brief supportive therapy provided. Discussed psychosocial stressors in detail.     Consultations: Encouraged to follow up with therapist- she has an appt tomorrow Encouraged to follow up with PCP as needed  Pt denies SI and is at an acute low risk for suicide at this time. Her chronic risk factors are hx of impulsive suicide attempt. Patient told to call clinic if any problems occur. Patient advised to go to ER if they should develop SI/HI, side effects, or if symptoms worsen. Has crisis numbers to call if needed. Pt verbalized understanding.  F/up in 3 months or sooner if needed      Charlcie Cradle, MD 10/17/2017, 8:36 AM

## 2017-10-28 ENCOUNTER — Ambulatory Visit (INDEPENDENT_AMBULATORY_CARE_PROVIDER_SITE_OTHER): Payer: Federal, State, Local not specified - PPO | Admitting: Psychology

## 2017-10-28 DIAGNOSIS — F411 Generalized anxiety disorder: Secondary | ICD-10-CM | POA: Diagnosis not present

## 2017-10-28 DIAGNOSIS — F331 Major depressive disorder, recurrent, moderate: Secondary | ICD-10-CM | POA: Diagnosis not present
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
## 2017-11-05 DIAGNOSIS — G4733 Obstructive sleep apnea (adult) (pediatric): Secondary | ICD-10-CM | POA: Diagnosis not present

## 2017-11-11 ENCOUNTER — Ambulatory Visit (INDEPENDENT_AMBULATORY_CARE_PROVIDER_SITE_OTHER): Payer: Federal, State, Local not specified - PPO | Admitting: Psychology

## 2017-11-11 DIAGNOSIS — F331 Major depressive disorder, recurrent, moderate: Secondary | ICD-10-CM | POA: Diagnosis not present

## 2017-11-11 DIAGNOSIS — F411 Generalized anxiety disorder: Secondary | ICD-10-CM

## 2017-11-11 NOTE — Progress Notes (Signed)
   THERAPIST PROGRESS NOTE  Session Time: 12.33pm-1.23pm  Participation Level: Active  Behavioral Response: Well GroomedAlertaffect wnl  Type of Therapy: Individual Therapy  Treatment Goals addressed: Diagnosis: GAD, MDd and goal1.  Interventions: CBT and Supportive  Summary: [name] is a 36 y.o. female who presents with affect wnl.  Pt reported that move that planned this weekend is postponed till next weekend.  Pt reported that between couple things fixed on the house and packing taking longer than planned will be ready for next weekend.  Pt reported that she is tired but is getting things completed.  Pt reported that that really has been the focus- she reports she took off some last week and today.  Pt discussed want to go home and take a nap but is aware if does that likely won't get done what is planned.   Suicidal/Homicidal: Nowithout intent/plan  Therapist Response: Assessed pt current functioning per pt report. Processed w/pt coping w/ stress of move.  Explored w/pt progress made and keeping motivated and decision making.    Plan: Return again in 2 weeks.  Diagnosis: GAD  Layloni Fahrner, [facility] 11/11/2017

## 2017-11-12 DIAGNOSIS — G4733 Obstructive sleep apnea (adult) (pediatric): Secondary | ICD-10-CM | POA: Diagnosis not present

## 2017-11-20 ENCOUNTER — Other Ambulatory Visit: Payer: Self-pay | Admitting: Nurse Practitioner

## 2017-11-20 DIAGNOSIS — M25561 Pain in right knee: Secondary | ICD-10-CM

## 2017-11-20 DIAGNOSIS — M542 Cervicalgia: Secondary | ICD-10-CM

## 2017-11-20 DIAGNOSIS — G8929 Other chronic pain: Secondary | ICD-10-CM

## 2017-11-25 ENCOUNTER — Encounter (HOSPITAL_COMMUNITY): Payer: Self-pay | Admitting: Psychology

## 2017-11-25 ENCOUNTER — Ambulatory Visit (HOSPITAL_COMMUNITY): Payer: Self-pay | Admitting: Psychology

## 2017-11-25 NOTE — Progress Notes (Signed)
[name] is a 37 y.o. female patient who didn't show for appointment.  Letter sent.        [name], LPC

## 2017-12-09 ENCOUNTER — Ambulatory Visit (HOSPITAL_COMMUNITY): Payer: Self-pay | Admitting: Psychology

## 2017-12-23 ENCOUNTER — Ambulatory Visit (HOSPITAL_COMMUNITY): Payer: Self-pay | Admitting: Psychology

## 2018-01-06 ENCOUNTER — Encounter (HOSPITAL_COMMUNITY): Payer: Self-pay | Admitting: Psychology

## 2018-01-06 ENCOUNTER — Ambulatory Visit (HOSPITAL_COMMUNITY): Payer: Self-pay | Admitting: Psychology

## 2018-01-06 NOTE — Progress Notes (Signed)
[name] is a 37 y.o. female patient who didn't show for appointment.Marland Kitchen letter sent.        [name], LPC

## 2018-01-16 ENCOUNTER — Ambulatory Visit (INDEPENDENT_AMBULATORY_CARE_PROVIDER_SITE_OTHER): Payer: Federal, State, Local not specified - PPO | Admitting: Psychiatry

## 2018-01-16 ENCOUNTER — Encounter (HOSPITAL_COMMUNITY): Payer: Self-pay | Admitting: Psychiatry

## 2018-01-16 ENCOUNTER — Other Ambulatory Visit (HOSPITAL_COMMUNITY): Payer: Self-pay | Admitting: Psychiatry

## 2018-01-16 DIAGNOSIS — F411 Generalized anxiety disorder: Secondary | ICD-10-CM

## 2018-01-16 DIAGNOSIS — F17210 Nicotine dependence, cigarettes, uncomplicated: Secondary | ICD-10-CM | POA: Diagnosis not present

## 2018-01-16 DIAGNOSIS — F332 Major depressive disorder, recurrent severe without psychotic features: Secondary | ICD-10-CM | POA: Diagnosis not present

## 2018-01-16 MED ORDER — BUPROPION HCL ER (XL) 150 MG PO TB24: 150.0000 mg | ORAL_TABLET | Freq: Every day | ORAL | 0 refills | Status: DC

## 2018-01-16 MED ORDER — FLUOXETINE HCL 20 MG PO CAPS: 60.0000 mg | ORAL_CAPSULE | Freq: Every day | ORAL | 0 refills | Status: DC

## 2018-01-16 MED ORDER — BUSPIRONE HCL 10 MG PO TABS: 10.0000 mg | ORAL_TABLET | Freq: Three times a day (TID) | ORAL | 0 refills | Status: DC

## 2018-01-16 NOTE — Progress Notes (Signed)
[facility] MD/PA/NP OP Progress Note  01/16/2018 8:53 AM [name]  MRN:  [id number]  Chief Complaint:  Chief Complaint    Follow-up     HPI:   Patient reports that she is doing well.  Her depression has significantly improved.  She states things at home with her husband are good but she does have some crying experiences with some of her children.  She is denying anhedonia and isolation.  She is denying low motivation.  Sleep is good when she remembers to take her BuSpar.  She is denying any suicidal and homicidal ideations.  She tells me that the BuSpar has been very effective in controlling her anxiety.  She notes that when she misses her nighttime dose she feels agitated, irritable with a low frustration tolerance and has teeth grinding.  On nights that she does take her BuSpar she does not experience the symptoms.  Things at work are going really well and her social anxiety seems to be under control.  She is going on a 2-week training in [place] in the middle of April.  She is looking forward to it and wants to take advantage of the time to learn and be away from the family to get a break.   Visit Diagnosis:    ICD-10-CM   1. Severe episode of recurrent major depressive disorder, without psychotic features ([place]) F33.2 buPROPion (WELLBUTRIN XL) 150 MG 24 hr tablet    FLUoxetine (PROZAC) 20 MG capsule  2. Tobacco dependence due to cigarettes F17.210 buPROPion (WELLBUTRIN XL) 150 MG 24 hr tablet  3. GAD (generalized anxiety disorder) F41.1 busPIRone (BUSPAR) 10 MG tablet    FLUoxetine (PROZAC) 20 MG capsule       Past Psychiatric History:  Hospitalizations-[facility] inpatient psychiatric unit from 09/16/2017 through 09/19/2017 for worsening depression and suicide attempt by overdose SIB/SA-had SI with plans in the past, impulsively attempted suicide by overdose on Prozac in September 2019 ; denies SIB Meds: Paxil- effective but stopped during pregnancy, Celexa-ineffective,  Zoloft-ineffective   Past Medical History:  Past Medical History:  Diagnosis Date  . Anxiety   . Cluster headaches   . Depression   . Eczema   . GERD (gastroesophageal reflux disease)   . Gestational diabetes 2012, 2016  . Hyperlipidemia   . Hypertension    no meds currently  . Sciatica   . Sleep apnea   . Snoring    sleep study 02/2012 with min AHI events    Past Surgical History:  Procedure Laterality Date  . CESAREAN SECTION  09 & 12   x's 2  . CESAREAN SECTION WITH BILATERAL TUBAL LIGATION Bilateral 12/18/2014   Procedure: REPEAT CESAREAN SECTION WITH BILATERAL TUBAL LIGATION;  Surgeon: Crawford Givens, MD;  Location: [facility];  Service: Obstetrics;  Laterality: Bilateral;  . COLPOSCOPY    . DENTAL SURGERY    . Ganglion removal from (L) wrist  1998  . LAPAROSCOPIC UNILATERAL SALPINGECTOMY Left 01/26/2015   Procedure: LAPAROSCOPIC UNILATERAL SALPINGECTOMY;  Surgeon: Crawford Givens, MD;  Location: [facility];  Service: Gynecology;  Laterality: Left;  . WISDOM TOOTH EXTRACTION      Family Psychiatric and Medical History:  Family History  Problem Relation Age of Onset  . Arthritis Mother   . Alcohol abuse Mother   . Mental illness Mother   . Cancer Mother 71       squamous - unknown primary  . Coronary artery disease Mother 26       MI with stent  .  Anxiety disorder Mother   . Depression Mother   . Arthritis Father   . Alcohol abuse Father   . Hyperlipidemia Father   . Heart disease Father   . Kidney disease Father   . Stroke Father   . Mental illness Father   . Drug abuse Father   . Diabetes Maternal Grandmother   . Heart disease Maternal Grandmother   . Stroke Maternal Grandfather   . Hypertension Paternal Grandfather   . Hyperlipidemia Paternal Grandfather   . Arthritis Other   . Alcohol abuse Other   . Breast cancer Paternal Aunt   . Anxiety disorder Sister     Social History:  Social History   Socioeconomic History  . Marital status: Married    Spouse  name: Not on file  . Number of children: 3  . Years of education: 60  . Highest education level: Not on file  Occupational History  . Occupation: Group Transport planner  . Financial resource strain: Not on file  . Food insecurity:    Worry: Not on file    Inability: Not on file  . Transportation needs:    Medical: Not on file    Non-medical: Not on file  Tobacco Use  . Smoking status: Former Smoker    Packs/day: 0.30    Years: 15.00    Pack years: 4.50    Types: Cigarettes    Last attempt to quit: 10/08/2016    Years since quitting: 1.2  . Smokeless tobacco: Never Used  . Tobacco comment: Patient just quit  Substance and Sexual Activity  . Alcohol use: Yes    Comment: once a month or less  . Drug use: No  . Sexual activity: Yes    Partners: Male    Birth control/protection: None  Lifestyle  . Physical activity:    Days per week: Not on file    Minutes per session: Not on file  . Stress: Not on file  Relationships  . Social connections:    Talks on phone: Not on file    Gets together: Not on file    Attends religious service: Not on file    Active member of club or organization: Not on file    Attends meetings of clubs or organizations: Not on file    Relationship status: Not on file  Other Topics Concern  . 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    Allergies:  Allergies  Allergen Reactions  . Codeine     STOMACH CRAMPING  . Doxycycline Nausea And Vomiting    Metabolic Disorder Labs: Lab Results  Component Value Date   HGBA1C 5.9 08/14/2017   No results found for: PROLACTIN Lab Results  Component Value Date   CHOL 192 08/14/2017   TRIG 150.0 (H) 08/14/2017   HDL 33.90 (L) 08/14/2017   CHOLHDL 6 08/14/2017   VLDL 30.0 08/14/2017   LDLCALC 128 (H) 08/14/2017   LDLCALC 152 (H) 02/09/2015    Lab Results  Component Value Date   TSH 2.51 08/14/2017   TSH 1.34 11/03/2015    Therapeutic Level Labs: No results found for: LITHIUM No results found for: VALPROATE No components found for:  CBMZ  Current Medications: Current Outpatient Medications  Medication Sig Dispense Refill  . buPROPion [facility]  XL) 150 MG 24 hr tablet Take 1 tablet (150 mg total) by mouth daily. 90 tablet 0  . busPIRone (BUSPAR) 10 MG tablet Take 1 tablet (10 mg total) by mouth 3 (three) times daily. For mood control 270 tablet 0  . FLUoxetine (PROZAC) 20 MG capsule Take 3 capsules (60 mg total) by mouth daily. Mood control 270 capsule 0  . ibuprofen (ADVIL,MOTRIN) 600 MG tablet TAKE 1 TABLET (600 MG TOTAL) BY MOUTH EVERY 8 (EIGHT) HOURS AS NEEDED FOR MODERATE PAIN. 30 tablet 0   No current facility-administered medications for this visit.      Musculoskeletal: Strength & Muscle Tone: within normal limits Gait & Station: normal Patient leans: N/A  Psychiatric Specialty Exam: Review of Systems  Constitutional: Negative for chills, diaphoresis and fever.  Respiratory: Negative for cough, sputum production, shortness of breath and wheezing.     Blood pressure 126/74, height 5' 2.5" (1.588 m), weight 295 lb (133.8 kg), unknown if currently breastfeeding.Body mass index is 53.1 kg/m.  General Appearance: Casual  Eye Contact:  Good  Speech:  Clear and Coherent and Normal Rate  Volume:  Normal  Mood:  Euthymic  Affect:  Full Range  Thought Process:  Goal Directed, Linear and Descriptions of Associations: Intact  Orientation:  Full (Time, Place, and Person)  Thought Content:  Logical  Suicidal Thoughts:  No  Homicidal Thoughts:  No  Memory:  Immediate;   Good  Judgement:  Good  Insight:  Good  Psychomotor Activity:  Normal  Concentration:  Concentration: Good  Recall:  Good  Fund of Knowledge:  Good  Language:  Good  Akathisia:  No  Handed:  Right  AIMS (if indicated):     Assets:   Communication Skills Desire for Improvement Financial Resources/Insurance Housing Intimacy Leisure Time Resilience Social Support Talents/Skills Transportation Vocational/Educational  ADL's:  Intact  Cognition:  WNL  Sleep:   good         Screenings: AIMS     Admission (Discharged) from 09/16/2017 in [facility]  AIMS Total Score  0    AUDIT     Admission (Discharged) from 09/16/2017 in [facility]  Alcohol Use Disorder Identification Test Final Score (AUDIT)  3    PHQ2-9     Office Visit from 02/09/2015 in [facility] from [facility]  PHQ-2 Total Score  2  1  PHQ-9 Total Score  5  -      I reviewed the information below on 01/16/2026 and have updated it Assessment and Plan: MDD-recurrent, severe; GAD; social anxiety disorder; Tobacco dependence   Medication management with supportive therapy. Risks and benefits, side effects and alternative treatment options discussed with patient. Pt was given an opportunity to ask questions about medication, illness, and treatment. All current psychiatric medications have been reviewed and discussed with the patient and adjusted as clinically appropriate. The patient has been provided an accurate and updated list of the medications being now prescribed. Patient expressed understanding of how their medications were to be used.  Pt verbalized understanding and verbal consent obtained for treatment.  Status of current problems:  Depression and anxiety have significantly improved.  She reports that she has been smoking 2 to 3 cigarettes a day mostly out of habit  Meds: Buspar 10mg  po TID for GAD  Prozac 60mg  p.o. daily for MDD, GAD, SAD  Wellbutrin XL 150mg  po qD for mood and  smoking cessation Discussed ways to improve afternoon and night dose compliance  Labs:  none  Reviewed labs done on  09/15/2017-CMP shows glucose of 126 otherwise normal, CBC within normal limits, pregnancy test was negative, UDS was negative EKG shows QTC of 453, normal sinus rhythm  Reviewed labs done on 08/14/2017-CMP shows a glucose of 105 but is otherwise normal; LDL is 128 and triglycerides are 150; CBC is within normal limits, hemoglobin A1c is 5.9; TSH is 2.51  Therapy: brief supportive therapy provided. Discussed psychosocial stressors in detail.     Consultations: Encouraged to follow up with therapist- she has an appt tomorrow Encouraged to follow up with PCP as needed  Pt denies SI and is at an acute low risk for suicide at this time. Her chronic risk factors are hx of impulsive suicide attempt. Patient told to call clinic if any problems occur. Patient advised to go to ER if they should develop SI/HI, side effects, or if symptoms worsen. Has crisis numbers to call if needed. Pt verbalized understanding.  F/up in 3 months or sooner if needed      Charlcie Cradle, MD 01/16/2018, 8:53 AM

## 2018-01-22 ENCOUNTER — Ambulatory Visit: Payer: Self-pay | Admitting: Pulmonary Disease

## 2018-01-23 ENCOUNTER — Ambulatory Visit (HOSPITAL_COMMUNITY): Payer: Federal, State, Local not specified - PPO | Admitting: Psychology

## 2018-01-24 ENCOUNTER — Ambulatory Visit: Payer: Self-pay | Admitting: Family Medicine

## 2018-02-03 ENCOUNTER — Ambulatory Visit (INDEPENDENT_AMBULATORY_CARE_PROVIDER_SITE_OTHER): Payer: Federal, State, Local not specified - PPO | Admitting: Psychology

## 2018-02-03 DIAGNOSIS — F411 Generalized anxiety disorder: Secondary | ICD-10-CM

## 2018-02-03 DIAGNOSIS — F331 Major depressive disorder, recurrent, moderate: Secondary | ICD-10-CM | POA: Diagnosis not present

## 2018-02-03 NOTE — Progress Notes (Signed)
   THERAPIST PROGRESS NOTE  Session Time: 10am-10.50am  Participation Level: Active  Behavioral Response: Well GroomedAlertaffect wnl  Type of Therapy: Individual Therapy  Treatment Goals addressed: Diagnosis: MDD, GAD and goal 1.  Interventions: CBT and Supportive  Summary: [name] is a 38 y.o. female who presents with affect wnl.  Pt reported that she has been doing well overall. Pt reported that still struggles w/ fatigue.  Pt reported that making very slow progress getting the new house set up- lacks motivation to work on- feels tired- but is still moving ahead slowly.  Pt reported that she still maintaining boundaries w/ dad and recognizing guilt trip comments he makes.  Pt reported that she had christmas eve w/ sister- positive.  Pt reported that work is going well and has 2nd training over spring break- looking forward to time and exploring [place] in between training.  Suicidal/Homicidal: Nowithout intent/plan  Therapist Response: Assessed pt current functioning per pt report.  Processed w/ pt motivation and fatigue and importance of keeping engaged and small tasks  Reflected positive steps she is making.  Discussed interactions w/ family and maintaining healthy boundaries.  Discussed upcoming things looking forward to.   Plan: Return again in 2 weeks.  Diagnosis: MDD, GAD    [name], LPC 02/03/2018

## 2018-02-04 DIAGNOSIS — G4733 Obstructive sleep apnea (adult) (pediatric): Secondary | ICD-10-CM | POA: Diagnosis not present

## 2018-02-07 ENCOUNTER — Ambulatory Visit: Payer: Federal, State, Local not specified - PPO | Admitting: Family Medicine

## 2018-02-07 ENCOUNTER — Ambulatory Visit (INDEPENDENT_AMBULATORY_CARE_PROVIDER_SITE_OTHER): Payer: Federal, State, Local not specified - PPO | Admitting: Pulmonary Disease

## 2018-02-07 ENCOUNTER — Encounter: Payer: Self-pay | Admitting: Family Medicine

## 2018-02-07 ENCOUNTER — Encounter: Payer: Self-pay | Admitting: Pulmonary Disease

## 2018-02-07 VITALS — BP 136/82 | HR 89 | Ht 62.0 in | Wt 299.0 lb

## 2018-02-07 DIAGNOSIS — G4733 Obstructive sleep apnea (adult) (pediatric): Secondary | ICD-10-CM

## 2018-02-07 DIAGNOSIS — M222X1 Patellofemoral disorders, right knee: Secondary | ICD-10-CM | POA: Diagnosis not present

## 2018-02-07 DIAGNOSIS — F17210 Nicotine dependence, cigarettes, uncomplicated: Secondary | ICD-10-CM

## 2018-02-07 DIAGNOSIS — M542 Cervicalgia: Secondary | ICD-10-CM

## 2018-02-07 DIAGNOSIS — M357 Hypermobility syndrome: Secondary | ICD-10-CM | POA: Insufficient documentation

## 2018-02-07 DIAGNOSIS — Z9989 Dependence on other enabling machines and devices: Secondary | ICD-10-CM | POA: Diagnosis not present

## 2018-02-07 MED ORDER — IBUPROFEN-FAMOTIDINE 800-26.6 MG PO TABS: 1.0000 | ORAL_TABLET | Freq: Three times a day (TID) | ORAL | 3 refills | Status: DC

## 2018-02-07 NOTE — Patient Instructions (Signed)
Nice to meet you  Please try the exercises  Please work on your posture for your neck and your knees  Please try to get a standing desk  Please try the new medicine. You can take this for 5 days straight and then as needed  Please see me back in 4-6 weeks to check your progress.

## 2018-02-07 NOTE — Assessment & Plan Note (Signed)
Tobacco cessation emphasized and encouraged

## 2018-02-07 NOTE — Patient Instructions (Signed)
CPAP is on auto settings and is working well. Congratulations on starting an exercise program, let us aim for weight loss of 20 to 30 pounds this year  Congratulations on quitting smoking

## 2018-02-07 NOTE — Assessment & Plan Note (Signed)
Beighton score 8/9. Unable to put hands flat on floor

## 2018-02-07 NOTE — Assessment & Plan Note (Signed)
Seems to be muscular in nature. Possible to be related to posture at work with sitting all day. Performed PT a few years ago. Possible to be related to hypermobility.  - counseled on supportive care and posture  - if no improvement consider imaging or injection

## 2018-02-07 NOTE — Progress Notes (Signed)
[name] - 38 y.o. female MRN [id number]  Date of birth: [birth year]  SUBJECTIVE:  Including CC & ROS.  Chief Complaint  Patient presents with  . Knee Pain    right x2 years   . Neck Pain    [name] is a 38 y.o. female that is  Presenting with acute on chronic right knee pain and neck pain. The right knee pain is worse with going up or down stairs. Has tried ibuprofen and had improvement. No improvement with mobic. No mechanical symptoms. Pain is anterior. No history of injury or surgery. No swelling.   Neck pain is acute on chronic. Has been ongoing for a few months. Pain is in the paraspinal muscles along the neck. No midline pain. Works at [employer] all day at [employer]. No radicular symptoms. No numbness or tingling.    Review of Systems  Constitutional: Negative for fever.  HENT: Negative for congestion.   Respiratory: Negative for cough.   Cardiovascular: Negative for chest pain.  Gastrointestinal: Negative for abdominal pain.  Musculoskeletal: Positive for neck pain. Negative for gait problem.  Skin: Negative for color change.  Neurological: Negative for weakness.  Hematological: Negative for adenopathy.  Psychiatric/Behavioral: Negative for agitation.    HISTORY: Past Medical, Surgical, Social, and Family History Reviewed & Updated per EMR.   Pertinent Historical Findings include:  Past Medical History:  Diagnosis Date  . Anxiety   . Cluster headaches   . Depression   . Eczema   . GERD (gastroesophageal reflux disease)   . Gestational diabetes 2012, 2016  . Hyperlipidemia   . Hypertension    no meds currently  . Sciatica   . Sleep apnea   . Snoring    sleep study 02/2012 with min AHI events    Past Surgical History:  Procedure Laterality Date  . CESAREAN SECTION  09 & 12   x's 2  . CESAREAN SECTION WITH BILATERAL TUBAL LIGATION Bilateral 12/18/2014   Procedure: REPEAT CESAREAN SECTION WITH BILATERAL TUBAL LIGATION;  Surgeon: Crawford Givens, MD;   Location: [facility];  Service: Obstetrics;  Laterality: Bilateral;  . COLPOSCOPY    . DENTAL SURGERY    . Ganglion removal from (L) wrist  1998  . LAPAROSCOPIC UNILATERAL SALPINGECTOMY Left 01/26/2015   Procedure: LAPAROSCOPIC UNILATERAL SALPINGECTOMY;  Surgeon: Crawford Givens, MD;  Location: [facility];  Service: Gynecology;  Laterality: Left;  . WISDOM TOOTH EXTRACTION      Allergies  Allergen Reactions  . Codeine     STOMACH CRAMPING  . Doxycycline Nausea And Vomiting    Family History  Problem Relation Age of Onset  . Arthritis Mother   . Alcohol abuse Mother   . Mental illness Mother   . Cancer Mother 38       squamous - unknown primary  . Coronary artery disease Mother 31       MI with stent  . Anxiety disorder Mother   . Depression Mother   . Arthritis Father   . Alcohol abuse Father   . Hyperlipidemia Father   . Heart disease Father   . Kidney disease Father   . Stroke Father   . Mental illness Father   . Drug abuse Father   . Diabetes Maternal Grandmother   . Heart disease Maternal Grandmother   . Stroke Maternal Grandfather   . Hypertension Paternal Grandfather   . Hyperlipidemia Paternal Grandfather   . Arthritis Other   . Alcohol abuse Other   .  Breast cancer Paternal Aunt   . Anxiety disorder Sister      Social History   Socioeconomic History  . Marital status: Married    Spouse name: Not on file  . Number of children: 3  . Years of education: 72  . Highest education level: Not on file  Occupational History  . Occupation: Group Transport planner  . Financial resource strain: Not on file  . Food insecurity:    Worry: Not on file    Inability: Not on file  . Transportation needs:    Medical: Not on file    Non-medical: Not on file  Tobacco Use  . Smoking status: Current Every Day Smoker    Packs/day: 0.25    Years: 15.00    Pack years: 3.75    Types: Cigarettes  . Smokeless tobacco: Never Used  . Tobacco comment: Patient just quit    Substance and Sexual Activity  . Alcohol use: Yes    Comment: once a month or less  . Drug use: No  . Sexual activity: Yes    Partners: Male    Birth control/protection: None  Lifestyle  . Physical activity:    Days per week: Not on file    Minutes per session: Not on file  . Stress: Not on file  Relationships  . Social connections:    Talks on phone: Not on file    Gets together: Not on file    Attends religious service: Not on file    Active member of club or organization: Not on file    Attends meetings of clubs or organizations: Not on file    Relationship status: Not on file  . Intimate partner violence:    Fear of current or ex partner: Not on file    Emotionally abused: Not on file    Physically abused: Not on file    Forced sexual activity: Not on file  Other Topics Concern  . 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     PHYSICAL EXAM:  VS: BP 136/82   Pulse 89   Ht 5\' 2"  (1.575 m)   Wt 299 lb (135.6 kg)   SpO2 98%   BMI 54.69 kg/m  Physical Exam Gen: NAD, alert, cooperative with exam, well-appearing ENT: normal lips, normal nasal mucosa,  Eye: normal EOM, normal conjunctiva and lids CV:  no edema, +2 pedal pulses   Resp: no accessory muscle use, non-labored,  Skin: no rashes, no areas of induration  Neuro: normal tone, normal sensation to touch Psych:  normal insight, alert and oriented MSK:  Neck:  TTP along the paraspinal neck muscles  No midline TTP  Normal ROM  Normal strength to resistance with shrug  Right knee:  No TTP along the medial or lateral joint  Normal strength to resistance  Knee has hyperextension b/l  No instability with valgus or varus  Negative McMurray's test  Neurovascularly intact      ASSESSMENT & PLAN:   Neck pain Seems to be muscular in nature. Possible to be  related to posture at work with sitting all day. Performed PT a few years ago. Possible to be related to hypermobility.  - counseled on supportive care and posture  - if no improvement consider imaging or injection  Patellofemoral syndrome of right knee Likely mostly related to PF syndrome but has component of hypermobility.  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 Unable to put hands flat on floor

## 2018-02-07 NOTE — Assessment & Plan Note (Signed)
Likely mostly related to PF syndrome but has component of hypermobility.  - duexis  - counseled on HEP and supportive care - if no improvement consider imaging or PT

## 2018-02-07 NOTE — Assessment & Plan Note (Signed)
CPAP is working well on current range of auto settings 7 to 12 cm. Clinically she is very sensitive and is requiring higher pressure if she gains weight She has settled down with nasal pillows and is very compliant. CPAP is only helped improve her daytime somnolence and fatigue  Weight loss encouraged, compliance with goal of at least 4-6 hrs every night is the expectation. Advised against medications with sedative side effects Cautioned against driving when sleepy - understanding that sleepiness will vary on a day to day basis

## 2018-02-07 NOTE — Progress Notes (Signed)
   Subjective:    Patient ID: [name], female    DOB: 01-Jan-1981, 38 y.o.   MRN: [id number]  HPI   38 yo woman for FU of OSA  Chief Complaint  Patient presents with  . Follow-up    pt doing well on cpap machine.   For annual follow-up, she failed dental appliance and was started on CPAP in 2018 She has settled down with nasal pillows and is tolerating well.  Wakes up feeling rested.  Does have some fatigue during the day, ongoing depression on Prozac and now BuSpar has been added for anxiety and Wellbutrin for depression. She has just quit smoking again with Wellbutrin. Has gained about 16 pounds to her current weight of 299 pounds. No snoring per her husband.  Download was reviewed which shows excellent compliance, average pressure of 12 cm on auto settings 7 to 12 cm with no residual events No complaints of dryness  Significant tests/ events reviewed  PSG2/2014 -263 pounds. 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  PSG 02/2016 280 lbs  AHI 12/h   Review of Systems neg for any significant sore throat, dysphagia, itching, sneezing, nasal congestion or excess/ purulent secretions, fever, chills, sweats, unintended wt loss, pleuritic or exertional cp, hempoptysis, orthopnea pnd or change in chronic leg swelling. Also denies presyncope, palpitations, heartburn, abdominal pain, nausea, vomiting, diarrhea or change in bowel or urinary habits, dysuria,hematuria, rash, arthralgias, visual complaints, headache, numbness weakness or ataxia.     Objective:   Physical Exam   Gen. Pleasant, obese, in no distress ENT - no lesions, no post nasal drip Neck: No JVD, no thyromegaly, no carotid bruits Lungs: no use of accessory muscles, no dullness to percussion, decreased without rales or rhonchi  Cardiovascular: Rhythm regular, heart sounds  normal, no murmurs or gallops, no peripheral  edema Musculoskeletal: No deformities, no cyanosis or clubbing , no tremors         Assessment & Plan:

## 2018-02-11 DIAGNOSIS — G4733 Obstructive sleep apnea (adult) (pediatric): Secondary | ICD-10-CM | POA: Diagnosis not present

## 2018-02-17 ENCOUNTER — Ambulatory Visit (HOSPITAL_COMMUNITY): Payer: Federal, State, Local not specified - PPO | Admitting: Psychology

## 2018-03-03 ENCOUNTER — Ambulatory Visit (INDEPENDENT_AMBULATORY_CARE_PROVIDER_SITE_OTHER): Payer: Federal, State, Local not specified - PPO | Admitting: Psychology

## 2018-03-03 DIAGNOSIS — F411 Generalized anxiety disorder: Secondary | ICD-10-CM | POA: Diagnosis not present

## 2018-03-03 DIAGNOSIS — F330 Major depressive disorder, recurrent, mild: Secondary | ICD-10-CM

## 2018-03-03 NOTE — Progress Notes (Signed)
   THERAPIST PROGRESS NOTE  Session Time: 12.35pm-1.27pm  Participation Level: Active  Behavioral Response: Well GroomedAlertaffect wnl  Type of Therapy: Individual Therapy  Treatment Goals addressed: Diagnosis: GAD, MDD and goal 1.  Interventions: CBT and Supportive  Summary: [name] is a 38 y.o. female who presents with affect wnl.  Pt reported feeling sad unexpected with loss of daughters's academically gifted teacher.  Pt reported that daughter was very close and was great support for her.  Pt discussed that she f/u w/ sports medicine doctor for 2nd opinion about joint pain and dx w/ hyperflexability of ligaments leading to pain she is experiencing- although no fix has plan for managing pain so can return to exercise and engaging in activities.  Pt discussed how [name] has impacted getting unpacked and in way of moving forward.    Suicidal/Homicidal: Nowithout intent/plan  Therapist Response: Assessed pt current functioning per pt report.  Processed w/pt recent stressors and validated feelings and loss and ways of coping.  Explored w/pt impact pain having and plan that gives hope w/ taking steps back towards her day to day engagement.  Plan: Return again in 2 weeks.  Diagnosis: MDD, GAD   [name], LPC 03/03/2018

## 2018-03-17 ENCOUNTER — Ambulatory Visit (INDEPENDENT_AMBULATORY_CARE_PROVIDER_SITE_OTHER): Payer: Federal, State, Local not specified - PPO | Admitting: Psychology

## 2018-03-17 DIAGNOSIS — F330 Major depressive disorder, recurrent, mild: Secondary | ICD-10-CM

## 2018-03-17 DIAGNOSIS — F411 Generalized anxiety disorder: Secondary | ICD-10-CM | POA: Diagnosis not present

## 2018-03-17 NOTE — Progress Notes (Signed)
   THERAPIST PROGRESS NOTE  Session Time: 12.38pm-1.28pm  Participation Level: Active  Behavioral Response: Well GroomedAlertaffect wnl.   Type of Therapy: Individual Therapy  Treatment Goals addressed: Diagnosis: GAD, MDD and goal 1.  Interventions: CBT and Supportive  Summary: [name] is a 38 y.o. female who presents with affect wnl.  Pt is distracted by her 3y/o son who is present. Pt is able to recognize this and state wanted a different option that didn't work out.  Pt reported that she took the day off to attend to several appointments.  Pt discussed how able to buy some things needed for kids and house w/ tax refund.  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  Pt discussed how still struggling w/ energy to get things accomplished around the house, but not being hard to self about. Pt discussed that her father has been contacting at times and guilt trip statements made even thought pt continues to express those statements counterproductive. Pt discussed how at time feels bad about but recognizes that boundaries she is keeping is healthier for her and family. Suicidal/Homicidal: Nowithout intent/plan  Therapist Response: Assessed pt current functioning per pt report. Processed w/pt stressors and interactions w/ family.  Explored w/ pt boundaries and how benefits to maintain.  discussed w/pt self care.  Plan: Return again in 2 weeks.  Diagnosis: GAD, MDD  [name], [facility] 03/17/2018

## 2018-03-31 ENCOUNTER — Ambulatory Visit (HOSPITAL_COMMUNITY): Payer: Federal, State, Local not specified - PPO | Admitting: Psychology

## 2018-03-31 ENCOUNTER — Telehealth (HOSPITAL_COMMUNITY): Payer: Self-pay | Admitting: Psychology

## 2018-03-31 NOTE — Telephone Encounter (Signed)
Attempted to set up and complete session as webex meeting.  Pt connected through webex but informed she didn't have privacy to have a session and so cancelled the session for today.  Pt reported she will call back to reschedule.

## 2018-04-10 ENCOUNTER — Encounter (HOSPITAL_COMMUNITY): Payer: Self-pay | Admitting: Psychiatry

## 2018-04-10 ENCOUNTER — Ambulatory Visit (INDEPENDENT_AMBULATORY_CARE_PROVIDER_SITE_OTHER): Payer: Federal, State, Local not specified - PPO | Admitting: Psychiatry

## 2018-04-10 ENCOUNTER — Other Ambulatory Visit: Payer: Self-pay

## 2018-04-10 DIAGNOSIS — F332 Major depressive disorder, recurrent severe without psychotic features: Secondary | ICD-10-CM | POA: Diagnosis not present

## 2018-04-10 DIAGNOSIS — Z79899 Other long term (current) drug therapy: Secondary | ICD-10-CM

## 2018-04-10 DIAGNOSIS — F17210 Nicotine dependence, cigarettes, uncomplicated: Secondary | ICD-10-CM

## 2018-04-10 DIAGNOSIS — F411 Generalized anxiety disorder: Secondary | ICD-10-CM | POA: Diagnosis not present

## 2018-04-10 MED ORDER — BUSPIRONE HCL 10 MG PO TABS: 10.0000 mg | ORAL_TABLET | Freq: Three times a day (TID) | ORAL | 0 refills | Status: DC

## 2018-04-10 MED ORDER — BUPROPION HCL ER (XL) 300 MG PO TB24: 300.0000 mg | ORAL_TABLET | Freq: Every day | ORAL | 0 refills | Status: DC

## 2018-04-10 MED ORDER — FLUOXETINE HCL 20 MG PO CAPS: 60.0000 mg | ORAL_CAPSULE | Freq: Every day | ORAL | 0 refills | Status: DC

## 2018-04-10 NOTE — Progress Notes (Signed)
Virtual Visit via Telephone Note  I connected with [name] on 04/10/18 at  8:30 AM EDT by telephone and verified that I am speaking with the correct person using two identifiers.   I discussed the limitations, risks, security and privacy concerns of performing an evaluation and management service by telephone and the availability of in person appointments. I also discussed with the patient that there may be a patient responsible charge related to this service. The patient expressed understanding and agreed to proceed.   History of Present Illness: [name] tells me that her depression is increased.  It has been stressful working from home.  She is juggling work and managing with her children.  She is having anxiety as well.  She worries that she is not spending enough time homeschooling her children since school is out.  She is denying any issues with her social anxiety has the current government mandate is to shelter in place and as such she does not have much social interaction with anyone.  She is denying any SI/HI.  She has been somewhat irritable and her sleep is on the poor side.  She has been taking her BuSpar but often forgets her afternoon dose.  She has set an alarm on her phone and I advised her to keep a few of her tablets at her workstation.  Now that she is working from home she notes that she has had an increase in the number of cigarettes she is smoking.  It was easier to decrease when she was at work because she would not take the cigarettes with her.  She notes an increase in cravings to smoke cigarettes.   Observations/Objective: I spoke with [name] on the phone.  She was engaged, pleasant and cooperative.  She answered questions appropriately.  Her speech was spontaneous with normal rate, tone and volume.  Her mood is depressed and her affect was congruent.  Thought processes were goal-directed and linear with intact associations.  She was oriented x4.  Her thought content  was logical.  She denies SI/HI.  Her memory and concentration were good.  Her fund of knowledge and language use were good.  Her insight and judgment are good.  She did not appear to be responding to internal stimuli during our conversation.  I am unable to comment on general appearance, hygiene, eye contact and psychomotor activity as I was unable to physically see the patient.  Assessment and Plan: MDD-recurrent, severe without psychotic features; GAD; social anxiety disorder; nicotine use disorder  Status of current symptoms; increase in depression and increase in smoking   Increase Wellbutrin XL 300mg  po qD for MDD and smoking cessation Continue BuSpar 10 mg p.o. 3 times daily for GAD.  Patient has been missing her afternoon dose and we problem solved for increased med compliance. Continue Prozac 60 mg p.o. daily for MDD, GAD, SAD  Follow Up Instructions: Follow-up with me on June 12, 2018 at 8:30 AM   I discussed the assessment and treatment plan with the patient. The patient was provided an opportunity to ask questions and all were answered. The patient agreed with the plan and demonstrated an understanding of the instructions.   The patient was advised to call back or seek an in-person evaluation if the symptoms worsen or if the condition fails to improve as anticipated.  I provided 25 minutes of non-face-to-face time during this encounter.   Charlcie Cradle, MD

## 2018-04-21 ENCOUNTER — Other Ambulatory Visit (HOSPITAL_COMMUNITY): Payer: Self-pay | Admitting: Psychiatry

## 2018-04-21 DIAGNOSIS — F17210 Nicotine dependence, cigarettes, uncomplicated: Secondary | ICD-10-CM

## 2018-04-21 DIAGNOSIS — F332 Major depressive disorder, recurrent severe without psychotic features: Secondary | ICD-10-CM

## 2018-05-06 DIAGNOSIS — G4733 Obstructive sleep apnea (adult) (pediatric): Secondary | ICD-10-CM | POA: Diagnosis not present

## 2018-05-12 DIAGNOSIS — G4733 Obstructive sleep apnea (adult) (pediatric): Secondary | ICD-10-CM | POA: Diagnosis not present

## 2018-05-19 ENCOUNTER — Ambulatory Visit (INDEPENDENT_AMBULATORY_CARE_PROVIDER_SITE_OTHER): Payer: Federal, State, Local not specified - PPO | Admitting: Psychology

## 2018-05-19 ENCOUNTER — Other Ambulatory Visit: Payer: Self-pay

## 2018-05-19 DIAGNOSIS — F411 Generalized anxiety disorder: Secondary | ICD-10-CM | POA: Diagnosis not present

## 2018-05-19 DIAGNOSIS — F331 Major depressive disorder, recurrent, moderate: Secondary | ICD-10-CM
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
## 2018-06-09 ENCOUNTER — Ambulatory Visit (HOSPITAL_COMMUNITY): Payer: Federal, State, Local not specified - PPO | Admitting: Psychology

## 2018-06-09 ENCOUNTER — Other Ambulatory Visit: Payer: Self-pay

## 2018-06-09 ENCOUNTER — Encounter (HOSPITAL_COMMUNITY): Payer: Self-pay | Admitting: Psychology

## 2018-06-09 NOTE — Progress Notes (Signed)
[name] is a 38 y.o. female patient who had an appointment for 12.30pm today.  [name] logged on at 12.43pm and informed that very frustrated w/ kids around and not able to have privacy or to concentrate.  Pt felt that it would be best to skip today's appointment.  We are scheduled for f/u on 06/30/18.  Marland Kitchen        [name], [facility]

## 2018-06-12 ENCOUNTER — Ambulatory Visit (HOSPITAL_COMMUNITY): Payer: Federal, State, Local not specified - PPO | Admitting: Psychiatry

## 2018-06-12 ENCOUNTER — Other Ambulatory Visit: Payer: Self-pay

## 2018-06-12 ENCOUNTER — Encounter (HOSPITAL_COMMUNITY): Payer: Self-pay | Admitting: Psychiatry

## 2018-06-12 DIAGNOSIS — F17210 Nicotine dependence, cigarettes, uncomplicated: Secondary | ICD-10-CM

## 2018-06-12 DIAGNOSIS — F411 Generalized anxiety disorder: Secondary | ICD-10-CM

## 2018-06-12 DIAGNOSIS — F332 Major depressive disorder, recurrent severe without psychotic features: Secondary | ICD-10-CM

## 2018-06-12 MED ORDER — BUSPIRONE HCL 10 MG PO TABS: 10.0000 mg | ORAL_TABLET | Freq: Three times a day (TID) | ORAL | 0 refills | Status: DC

## 2018-06-12 MED ORDER — FLUOXETINE HCL 20 MG PO CAPS: 60.0000 mg | ORAL_CAPSULE | Freq: Every day | ORAL | 0 refills | Status: DC

## 2018-06-12 MED ORDER — BUPROPION HCL ER (XL) 300 MG PO TB24: 300.0000 mg | ORAL_TABLET | Freq: Every day | ORAL | 0 refills | Status: DC

## 2018-06-12 NOTE — Progress Notes (Unsigned)
Virtual Visit via Telephone Note  I connected with [name] on 06/12/18 at  8:30 AM EDT by telephone and verified that I am speaking with the correct person using two identifiers.  Location: Patient: home Provider: office   I discussed the limitations, risks, security and privacy concerns of performing an evaluation and management service by telephone and the availability of in person appointments. I also discussed with the patient that there may be a patient responsible charge related to this service. The patient expressed understanding and agreed to proceed.   History of Present Illness: Pt is "ok given the circumstances". She feels she is handling things ok and not reacting as badly as she would have in the past. Her son has become very, very clingy but her husband is helping out. Pt is working from home and likes certain things about it. She likes not having to rush in the mornings and not having to set an alarm. [name] does not expect to back in the office for a long while. She admits she has been more irritable "given the circumstances". [name] is having signifcant back pain which contributes to her irritability. She feels some motivation but the back pain often causes her to lose it. Pt is working with her sports med doctor about her back pain. Pt states "anxiety is pretty good now. I am not having any major issues". If she forgets her PM dose of Buspar then she has jaw grinding. Pt was doing well with lunch time dose when she kept it at her desk but started missing it again when she moved the pills. She states her depression is unchanged and alright. Sleep is ok and she wakes up feeling rested. She denies SI/HI. Her social anxiety is well controlled. Pt is smoking more (6-7 cigs/day). It is a habit and serves as a form of a break from work. Pt does not think it is craving.  Patient reports that therapy has been very helpful.  They are working on identifying thoughts and feelings and  ways of coping.   Observations/Objective:   Assessment and Plan: MDD- recurrent, severe w/o psychotic features; GAD; Social anxiety d/o; Nicotine use d/o  Buspar 10mg  po TID for GAD Wellbutrin XL 300mg  po MDD and smoking cessation Prozac 60mg  po qD for MDD, GAD, SAD  We discussed smoking cessation  Follow Up Instructions:  In 3 months or sooner if needed   I discussed the assessment and treatment plan with the patient. The patient was provided an opportunity to ask questions and all were answered. The patient agreed with the plan and demonstrated an understanding of the instructions.   The patient was advised to call back or seek an in-person evaluation if the symptoms worsen or if the condition fails to improve as anticipated.  I provided 25 minutes of non-face-to-face time during this encounter.   Charlcie Cradle, MD

## 2018-06-30 ENCOUNTER — Other Ambulatory Visit: Payer: Self-pay

## 2018-06-30 ENCOUNTER — Ambulatory Visit (INDEPENDENT_AMBULATORY_CARE_PROVIDER_SITE_OTHER): Payer: Federal, State, Local not specified - PPO | Admitting: Psychology

## 2018-06-30 DIAGNOSIS — F411 Generalized anxiety disorder: Secondary | ICD-10-CM

## 2018-06-30 DIAGNOSIS — F330 Major depressive disorder, recurrent, mild: Secondary | ICD-10-CM | POA: Diagnosis not present
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
## 2018-07-04 ENCOUNTER — Ambulatory Visit (INDEPENDENT_AMBULATORY_CARE_PROVIDER_SITE_OTHER): Payer: Federal, State, Local not specified - PPO | Admitting: Family Medicine

## 2018-07-04 ENCOUNTER — Other Ambulatory Visit: Payer: Self-pay

## 2018-07-04 ENCOUNTER — Encounter: Payer: Self-pay | Admitting: Family Medicine

## 2018-07-04 VITALS — BP 142/90 | HR 90 | Resp 18 | Wt 315.0 lb

## 2018-07-04 DIAGNOSIS — M545 Low back pain, unspecified: Secondary | ICD-10-CM | POA: Insufficient documentation

## 2018-07-04 DIAGNOSIS — G8929 Other chronic pain: Secondary | ICD-10-CM

## 2018-07-04 DIAGNOSIS — M357 Hypermobility syndrome: Secondary | ICD-10-CM

## 2018-07-04 MED ORDER — BACLOFEN 10 MG PO TABS: 10.0000 mg | ORAL_TABLET | Freq: Two times a day (BID) | ORAL | 0 refills | Status: DC | PRN

## 2018-07-04 NOTE — Progress Notes (Signed)
[name] - 38 y.o. female MRN [id number]  Date of birth: 1980/04/15  SUBJECTIVE:  Including CC & ROS.  No chief complaint on file.   [name] is a 38 y.o. female that is presenting with acute on chronic low back pain. The pain is worse with certain movements. The pain is sharp and stabbing. No radicular symptoms. Pain is occurring in the bilateral lower back. Has tried different exercises with limited improvement. No inciting event. No trauma.   Independent review of the lumbar spine from 2016 does not show significant abnormality.    Review of Systems  Constitutional: Negative for fever.  HENT: Negative for congestion.   Respiratory: Negative for cough.   Cardiovascular: Negative for chest pain.  Gastrointestinal: Negative for abdominal pain.  Musculoskeletal: Positive for back pain.  Skin: Negative for color change.  Neurological: Negative for weakness.  Hematological: Negative for adenopathy.    HISTORY: Past Medical, Surgical, Social, and Family History Reviewed & Updated per EMR.   Pertinent Historical Findings include:  Past Medical History:  Diagnosis Date  . Anxiety   . Cluster headaches   . Depression   . Eczema   . GERD (gastroesophageal reflux disease)   . Gestational diabetes 2012, 2016  . Hyperlipidemia   . Hypertension    no meds currently  . Sciatica   . Sleep apnea   . Snoring    sleep study 02/2012 with min AHI events    Past Surgical History:  Procedure Laterality Date  . CESAREAN SECTION  09 & 12   x's 2  . CESAREAN SECTION WITH BILATERAL TUBAL LIGATION Bilateral 12/18/2014   Procedure: REPEAT CESAREAN SECTION WITH BILATERAL TUBAL LIGATION;  Surgeon: [name] Givens, MD;  Location: [facility];  Service: Obstetrics;  Laterality: Bilateral;  . COLPOSCOPY    . DENTAL SURGERY    . Ganglion removal from (L) wrist  1998  . LAPAROSCOPIC UNILATERAL SALPINGECTOMY Left 01/26/2015   Procedure: LAPAROSCOPIC UNILATERAL SALPINGECTOMY;  Surgeon: [name] Givens, MD;  Location: [facility];  Service: Gynecology;  Laterality: Left;  . WISDOM TOOTH EXTRACTION      Allergies  Allergen Reactions  . Codeine     STOMACH CRAMPING  . Doxycycline Nausea And Vomiting    Family History  Problem Relation Age of Onset  . Arthritis Mother   . Alcohol abuse Mother   . Mental illness Mother   . Cancer Mother 96       squamous - unknown primary  . Coronary artery disease Mother 65       MI with stent  . Anxiety disorder Mother   . Depression Mother   . Arthritis Father   . Alcohol abuse Father   . Hyperlipidemia Father   . Heart disease Father   . Kidney disease Father   . Stroke Father   . Mental illness Father   . Drug abuse Father   . Diabetes Maternal Grandmother   . Heart disease Maternal Grandmother   . Stroke Maternal Grandfather   . Hypertension Paternal Grandfather   . Hyperlipidemia Paternal Grandfather   . Arthritis Other   . Alcohol abuse Other   . Breast cancer Paternal Aunt   . Anxiety disorder Sister      Social History   Socioeconomic History  . Marital status: Married    Spouse name: Not on file  . Number of children: 3  . Years of education: 59  . Highest education level: Not on file  Occupational History  .  Occupation: Group Transport planner  . Financial resource strain: Not on file  . Food insecurity    Worry: Not on file    Inability: Not on file  . Transportation needs    Medical: Not on file    Non-medical: Not on file  Tobacco Use  . Smoking status: Current Every Day Smoker    Packs/day: 0.25    Years: 15.00    Pack years: 3.75    Types: Cigarettes  . Smokeless tobacco: Never Used  . Tobacco comment: Patient just quit  Substance and Sexual Activity  . Alcohol use: Yes    Comment: once a month or less  . Drug use: No  . Sexual activity: Yes    Partners: Male    Birth control/protection: None  Lifestyle  . Physical activity    Days per week: Not on file    Minutes per session: Not  on file  . Stress: Not on file  Relationships  . Social Herbalist on phone: Not on file    Gets together: Not on file    Attends religious service: Not on file    Active member of club or organization: Not on file    Attends meetings of clubs or organizations: Not on file    Relationship status: Not on file  . Intimate partner violence    Fear of current or ex partner: Not on file    Emotionally abused: Not on file    Physically abused: Not on file    Forced sexual activity: Not on file  Other Topics Concern  . Not on file  Social History Narrative   Secretary   Married, lives with spouse and 2 kids   Moved to [facility] summer 2011 to be near mom - originally from [place]   Denies abuse and feels safe at home.     PHYSICAL EXAM:  VS: BP (!) 142/90   Pulse 90   Resp 18   Wt (!) 315 lb (142.9 kg)   SpO2 98%   BMI 55.80 kg/m  Physical Exam Gen: NAD, alert, cooperative with exam, well-appearing ENT: normal lips, normal nasal mucosa,  Eye: normal EOM, normal conjunctiva and lids CV:  no edema, +2 pedal pulses   Resp: no accessory muscle use, non-labored,   Skin: no rashes, no areas of induration  Neuro: normal tone, normal sensation to touch Psych:  normal insight, alert and oriented MSK:  Back: No TTP of the si joint or greater troch Normal flexion and extension  Normal strength to resistance  Negative SLR Neurovascularly intact       ASSESSMENT & PLAN:   Low back pain Likely related to spasm and her obesity.  - referral to PT  - baclofen  - counseled on supportive care - if no improvement consider imaging   Morbid obesity ([place]) Referral placed to weight management.

## 2018-07-04 NOTE — Patient Instructions (Signed)
Good to see you Please try the exercises  Please try heat on the lower back  Please try the medicine and if it makes you too sleepy   Please send me a message in MyChart with any questions or updates.  Please see me back in 4 weeks.   --Dr. Raeford Razor

## 2018-07-07 NOTE — Assessment & Plan Note (Signed)
Referral placed to weight management.

## 2018-07-07 NOTE — Assessment & Plan Note (Signed)
Likely related to spasm and her obesity.  - referral to PT  - baclofen  - counseled on supportive care - if no improvement consider imaging

## 2018-07-21 ENCOUNTER — Ambulatory Visit (INDEPENDENT_AMBULATORY_CARE_PROVIDER_SITE_OTHER): Payer: Federal, State, Local not specified - PPO | Admitting: Psychology

## 2018-07-21 ENCOUNTER — Other Ambulatory Visit: Payer: Self-pay

## 2018-07-21 DIAGNOSIS — F411 Generalized anxiety disorder: Secondary | ICD-10-CM

## 2018-07-21 DIAGNOSIS — F331 Major depressive disorder, recurrent, moderate: Secondary | ICD-10-CM | POA: Diagnosis not present

## 2018-07-21 NOTE — Progress Notes (Signed)
Virtual Visit via Video Note  I connected with [name] on 07/21/18 at 12:30 PM EDT by a video enabled telemedicine application and verified that I am speaking with the correct person using two identifiers.   I discussed the limitations of evaluation and management by telemedicine and the availability of in person appointments. The patient expressed understanding and agreed to proceed.    I discussed the assessment and treatment plan with the patient. The patient was provided an opportunity to ask questions and all were answered. The patient agreed with the plan and demonstrated an understanding of the instructions.   The patient was advised to call back or seek an in-person evaluation if the symptoms worsen or if the condition fails to improve as anticipated.  I provided 45 minutes of non-face-to-face time during this encounter.   [name] [facility]    THERAPIST PROGRESS NOTE  Session Time: 12.35pm-1.20pm  Participation Level: Active  Behavioral Response: Well GroomedAlertaffect wnl  Type of Therapy: Individual Therapy  Treatment Goals addressed: Diagnosis: GAD, MDD and goal 1.  Interventions: CBT and Supportive  Summary: [name] is a 38 y.o. female who presents with affect wnl.  Pt reported that work is going well- slow, but about to pick up w/ dealine upcoming.  Pt reported that she has been seeing some progress she is making w/ the house and feels encouraging.  Pt f/u w/her doctor about back pain and prescribed new medication and [facility]- pt hasn't heard from yet so plans to f/u on her own. Pt reported that dad has some transportation which she is happy for but fears he will show up to home unannounced- pt was able to identify how she will respond if does.  Pt discussed stress of son being overly clingy and continuing w/ his services and assessments.    Suicidal/Homicidal: Nowithout intent/plan  Therapist Response: Assessed pt  current functioning per pt report. Processed w/pt coping w/ stressors and how pt is taking healthy steps and using coping skills.  Assisted pt w/ identifying assertive response to worry of dad showing unannounced.    Plan: Return again in 2 weeks, via webex. Diagnosis: GAD, MDD   [name] [facility] 07/21/2018

## 2018-08-04 ENCOUNTER — Other Ambulatory Visit: Payer: Self-pay

## 2018-08-04 ENCOUNTER — Ambulatory Visit (HOSPITAL_COMMUNITY): Payer: Federal, State, Local not specified - PPO | Admitting: Psychology

## 2018-08-07 ENCOUNTER — Ambulatory Visit (INDEPENDENT_AMBULATORY_CARE_PROVIDER_SITE_OTHER): Payer: Federal, State, Local not specified - PPO | Admitting: Psychiatry

## 2018-08-07 ENCOUNTER — Other Ambulatory Visit: Payer: Self-pay

## 2018-08-07 ENCOUNTER — Encounter (HOSPITAL_COMMUNITY): Payer: Self-pay | Admitting: Psychiatry

## 2018-08-07 ENCOUNTER — Other Ambulatory Visit (HOSPITAL_COMMUNITY): Payer: Self-pay | Admitting: Psychiatry

## 2018-08-07 DIAGNOSIS — F332 Major depressive disorder, recurrent severe without psychotic features: Secondary | ICD-10-CM | POA: Diagnosis not present

## 2018-08-07 DIAGNOSIS — F411 Generalized anxiety disorder: Secondary | ICD-10-CM

## 2018-08-07 DIAGNOSIS — F17210 Nicotine dependence, cigarettes, uncomplicated: Secondary | ICD-10-CM | POA: Diagnosis not present

## 2018-08-07 MED ORDER — FLUOXETINE HCL 20 MG PO CAPS: 60.0000 mg | ORAL_CAPSULE | Freq: Every day | ORAL | 0 refills | Status: DC

## 2018-08-07 MED ORDER — BUPROPION HCL ER (XL) 150 MG PO TB24: ORAL_TABLET | ORAL | 0 refills | Status: DC

## 2018-08-07 MED ORDER — BUSPIRONE HCL 10 MG PO TABS: 10.0000 mg | ORAL_TABLET | Freq: Three times a day (TID) | ORAL | 0 refills | Status: DC

## 2018-08-07 NOTE — Progress Notes (Signed)
Virtual Visit via Telephone Note  I connected with [name] on 08/07/18 at  8:30 AM EDT by telephone and verified that I am speaking with the correct person using two identifiers.  Location: Patient: Home Provider: Office   I discussed the limitations, risks, security and privacy concerns of performing an evaluation and management service by telephone and the availability of in person appointments. I also discussed with the patient that there may be a patient responsible charge related to this service. The patient expressed understanding and agreed to proceed.   History of Present Illness: "Doing ok. I think I need to start weaning off the Wellbutrin. I am still smoking 5-6 cigarettes a day.  It's not helping with the cigarettes and it gives me dry mouth and I am gaining weight". She states she is the biggest she has ever been in her life and she is very uncomfortable. Her joint pain is worse. Pt is upset with herself for gaining this much weight. She went to the sports medicine doctor Her depression is ok other than the disappointment in herself regarding the weight gain. It makes her feel self conscious and embarrassed.  Pt denies anhedonia. She denies SI/HI. Sleep is fair. Her motivation is good but the pain prevents her from doing anything for long. She is anxious about her weight. Her diet is better. 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 [name] states she is not eating a lot and is making healthier choices. Pt is not eating breakfast.   Pt states she has not tried Chantix but her sister and mom both had bad reactions to it.     Observations/Objective: I spoke with [name] on the phone.  Pt was calm, pleasant and cooperative.  Pt was engaged in the conversation and answered questions appropriately.  Speech was clear and coherent with normal rate, tone and volume.  Mood is mildly depressed and anxious, affect is congruent. Thought processes are coherent,  goal oriented and intact.  Thought content is with ruminations on her weight.  Pt denies SI/HI.   Pt denies auditory and visual hallucinations and did not appear to be responding to internal stimuli.  Memory and concentration are good.  Fund of knowledge and use of language are average.  Insight and judgment are fair.  I am unable to comment on psychomotor activity, general appearance, hygiene, or eye contact as I was unable to physically see the patient on the phone.  Vital signs not available since interview conducted virtually.    Assessment and Plan: MDD- recurrent, severe without psychotic features; GAD; Social anxiety d/o; Nicotine use disorder  Buspar 10mg  po TID  D/c Wellbutrin XL- taper down to 150mg  for 5 days then d/c  Prozac 60mg  po qD   I encouraged the patient to continue therapy.  She states it is helpful and they are currently discussing many things including her concerns about her weight gain  I encouraged the patient to contact the weight management clinic if she has not heard from them by the end of today.  We reviewed her diet and patient is making some positive changes.  Follow Up Instructions: In 6-8 weeks   I discussed the assessment and treatment plan with the patient. The patient was provided an opportunity to ask questions and all were answered. The patient agreed with the plan and demonstrated an understanding of the instructions.   The patient was advised to call back or seek an in-person evaluation if  the symptoms worsen or if the condition fails to improve as anticipated.  I provided 25 minutes of non-face-to-face time during this encounter.   Charlcie Cradle, MD

## 2018-09-01 ENCOUNTER — Ambulatory Visit (HOSPITAL_COMMUNITY): Payer: Federal, State, Local not specified - PPO | Admitting: Psychology

## 2018-09-01 ENCOUNTER — Other Ambulatory Visit: Payer: Self-pay

## 2018-09-15 ENCOUNTER — Ambulatory Visit (HOSPITAL_COMMUNITY): Payer: Federal, State, Local not specified - PPO | Admitting: Psychology

## 2018-09-25 ENCOUNTER — Ambulatory Visit (INDEPENDENT_AMBULATORY_CARE_PROVIDER_SITE_OTHER): Payer: Federal, State, Local not specified - PPO | Admitting: Psychiatry

## 2018-09-25 ENCOUNTER — Other Ambulatory Visit: Payer: Self-pay

## 2018-09-25 ENCOUNTER — Encounter (HOSPITAL_COMMUNITY): Payer: Self-pay | Admitting: Psychiatry

## 2018-09-25 DIAGNOSIS — F332 Major depressive disorder, recurrent severe without psychotic features: Secondary | ICD-10-CM | POA: Diagnosis not present

## 2018-09-25 DIAGNOSIS — F411 Generalized anxiety disorder: Secondary | ICD-10-CM

## 2018-09-25 MED ORDER — FLUOXETINE HCL 20 MG PO CAPS: 60.0000 mg | ORAL_CAPSULE | Freq: Every day | ORAL | 0 refills | Status: DC

## 2018-09-25 NOTE — Progress Notes (Signed)
  Virtual Visit via Telephone Note  I connected with [name]  on 09/25/18 at  9:00 AM EDT by telephone and verified that I am speaking with the correct person using two identifiers.  Location: Patient: home Provider: office   I discussed the limitations, risks, security and privacy concerns of performing an evaluation and management service by telephone and the availability of in person appointments. I also discussed with the patient that there may be a patient responsible charge related to this service. The patient expressed understanding and agreed to proceed.   History of Present Illness: "I am doing ok". Pt is reporting ongoing pain issues. She is on the wait list to get in with the weight management clinic. Her depression is "pretty well. I am really feeling it right now. The depression stuff is good". She denies anhedonia. She denies SI/HI. [name] is starting to feel more motivated to do things but the pain prevents her from doing a lot. She is still have issues remember her middle dose of Buspar. [name] does feel irritable with any stress. She will then take Buspar in the afternoon and it helps. Her sleep is good. Her social anxiety is fine because she doesn't go anywhere due to the pandemic. She does feel uncomfortable when she goes out for errands but it does not prevent her from going out or complete her tasks. [name] is smoking more- 6-7 cigs/day and before was smoking about 5 cigs/day.      Observations/Objective: I spoke with [name] on the phone.  Pt was calm, pleasant and cooperative.  Pt was engaged in the conversation and answered questions appropriately.  Speech was clear and coherent with normal rate, tone and volume.  Mood is euthymic, affect is full. Thought processes are coherent, goal oriented and intact.  Thought content is logical.  Pt denies SI/HI.   Pt denies auditory and visual hallucinations and did not appear to be responding to internal stimuli.   Memory and concentration are good.  Fund of knowledge and use of language are average.  Insight and judgment are fair.  I am unable to comment on psychomotor activity, general appearance, hygiene, or eye contact as I was unable to physically see the patient on the phone.  Vital signs not available since interview conducted virtually.     Assessment and Plan: MDD-recurrent, severe without psychotic features; GAD; Social anxiety disorder; Nicotine use do  Status of current symptoms: overall stable  Buspar 10mg  po TID for GAD and MDD  Prozac 60mg  po qD for MDD, GAD and social anxiety  Discussed smoking cessation in detail- I recommended she call 1-800-quitline. Wellbutrin was ineffective.   Follow Up Instructions: In 8-12 weeks or sooner if needed   I discussed the assessment and treatment plan with the patient. The patient was provided an opportunity to ask questions and all were answered. The patient agreed with the plan and demonstrated an understanding of the instructions.   The patient was advised to call back or seek an in-person evaluation if the symptoms worsen or if the condition fails to improve as anticipated.  I provided 20 minutes of non-face-to-face time during this encounter.   Charlcie Cradle, MD

## 2018-09-29 ENCOUNTER — Other Ambulatory Visit (INDEPENDENT_AMBULATORY_CARE_PROVIDER_SITE_OTHER): Payer: Federal, State, Local not specified - PPO

## 2018-09-29 ENCOUNTER — Encounter: Payer: Self-pay | Admitting: Family

## 2018-09-29 ENCOUNTER — Ambulatory Visit (INDEPENDENT_AMBULATORY_CARE_PROVIDER_SITE_OTHER): Payer: Federal, State, Local not specified - PPO | Admitting: Family

## 2018-09-29 ENCOUNTER — Other Ambulatory Visit: Payer: Self-pay

## 2018-09-29 ENCOUNTER — Ambulatory Visit (INDEPENDENT_AMBULATORY_CARE_PROVIDER_SITE_OTHER): Payer: Federal, State, Local not specified - PPO | Admitting: Psychology

## 2018-09-29 VITALS — BP 146/84 | HR 85 | Temp 98.5°F | Ht 63.0 in | Wt 321.2 lb

## 2018-09-29 DIAGNOSIS — R635 Abnormal weight gain: Secondary | ICD-10-CM

## 2018-09-29 DIAGNOSIS — M791 Myalgia, unspecified site: Secondary | ICD-10-CM | POA: Diagnosis not present

## 2018-09-29 DIAGNOSIS — R030 Elevated blood-pressure reading, without diagnosis of hypertension: Secondary | ICD-10-CM | POA: Diagnosis not present

## 2018-09-29 DIAGNOSIS — M2550 Pain in unspecified joint: Secondary | ICD-10-CM

## 2018-09-29 DIAGNOSIS — F411 Generalized anxiety disorder: Secondary | ICD-10-CM | POA: Diagnosis not present

## 2018-09-29 DIAGNOSIS — F330 Major depressive disorder, recurrent, mild: Secondary | ICD-10-CM | POA: Diagnosis not present

## 2018-09-29 LAB — CBC WITH DIFFERENTIAL/PLATELET
Basophils Absolute: 0 10*3/uL (ref 0.0–0.1)
Basophils Relative: 0.3 % (ref 0.0–3.0)
Eosinophils Absolute: 0.1 10*3/uL (ref 0.0–0.7)
Eosinophils Relative: 1.9 % (ref 0.0–5.0)
HCT: 37.8 % (ref 36.0–46.0)
Hemoglobin: 12.6 g/dL (ref 12.0–15.0)
Lymphocytes Relative: 22.7 % (ref 12.0–46.0)
Lymphs Abs: 1.5 10*3/uL (ref 0.7–4.0)
MCHC: 33.3 g/dL (ref 30.0–36.0)
MCV: 87.3 fl (ref 78.0–100.0)
Monocytes Absolute: 0.3 10*3/uL (ref 0.1–1.0)
Monocytes Relative: 5 % (ref 3.0–12.0)
Neutro Abs: 4.7 10*3/uL (ref 1.4–7.7)
Neutrophils Relative %: 70.1 % (ref 43.0–77.0)
Platelets: 292 10*3/uL (ref 150.0–400.0)
RBC: 4.33 Mil/uL (ref 3.87–5.11)
RDW: 14 % (ref 11.5–15.5)
WBC: 6.7 10*3/uL (ref 4.0–10.5)

## 2018-09-29 LAB — COMPREHENSIVE METABOLIC PANEL
ALT: 18 U/L (ref 0–35)
AST: 13 U/L (ref 0–37)
Albumin: 4 g/dL (ref 3.5–5.2)
Alkaline Phosphatase: 91 U/L (ref 39–117)
BUN: 11 mg/dL (ref 6–23)
CO2: 29 mEq/L (ref 19–32)
Calcium: 9.3 mg/dL (ref 8.4–10.5)
Chloride: 103 mEq/L (ref 96–112)
Creatinine, Ser: 0.91 mg/dL (ref 0.40–1.20)
GFR: 68.81 mL/min (ref >60.00)
Glucose, Bld: 98 mg/dL (ref 70–99)
Potassium: 4 mEq/L (ref 3.5–5.1)
Sodium: 138 mEq/L (ref 135–145)
Total Bilirubin: 0.4 mg/dL (ref 0.2–1.2)
Total Protein: 6.9 g/dL (ref 6.0–8.3)

## 2018-09-29 LAB — MAGNESIUM: Magnesium: 1.9 mg/dL (ref 1.5–2.5)

## 2018-09-29 LAB — TSH: TSH: 3.37 u[IU]/mL (ref 0.35–4.50)

## 2018-09-29 LAB — HEMOGLOBIN A1C: Hgb A1c MFr Bld: 6.1 % (ref 4.6–6.5)

## 2018-09-29 LAB — VITAMIN B12: Vitamin B-12: 337 pg/mL (ref 211–911)

## 2018-09-29 LAB — URIC ACID: Uric Acid, Serum: 4.5 mg/dL (ref 2.4–7.0)

## 2018-09-29 LAB — SEDIMENTATION RATE: Sed Rate: 31 mm/hr — ABNORMAL HIGH (ref 0–20)

## 2018-09-29 LAB — VITAMIN D 25 HYDROXY (VIT D DEFICIENCY, FRACTURES): VITD: 20.59 ng/mL — ABNORMAL LOW (ref 30.00–100.00)

## 2018-09-29 NOTE — Progress Notes (Signed)
Virtual Visit via Video Note  I connected with [name] on 09/29/18 at 12:30 PM EDT by a video enabled telemedicine application and verified that I am speaking with the correct person using two identifiers.   I discussed the limitations of evaluation and management by telemedicine and the availability of in person appointments. The patient expressed understanding and agreed to proceed.    I discussed the assessment and treatment plan with the patient. The patient was provided an opportunity to ask questions and all were answered. The patient agreed with the plan and demonstrated an understanding of the instructions.   The patient was advised to call back or seek an in-person evaluation if the symptoms worsen or if the condition fails to improve as anticipated.  I provided 47 minutes of non-face-to-face time during this encounter.   [name] [facility]    THERAPIST PROGRESS NOTE  Session Time: 12.30pm-1.17pm  Participation Level: Active  Behavioral Response: Well GroomedAlertaffect wnl  Type of Therapy: Individual Therapy  Treatment Goals addressed: Diagnosis: GAD, MDD and goal 1.  Interventions: CBT and Strength-based  Summary: [name] is a 38 y.o. female who presents with affect wnl.  Pt reported that she is doing ok w/ mood- not feeling depressed.  Pt reports she is doing well w/ motivation- but her barrier is pain.  Pt will do things in small spurts before pain requires her to take a break. Pt reported that she did have doctor visit today and felt good that was listened to and is doing several labs to explore several potential dx.  Pt reported that biggest stressor is working from home and trying to parent at the same time.  Pt is aware that will be limited.  Pt reported that she is struggling w/ self image w/ weight.  Pt receptive of ways of reflecting worth not tied to weight or shape.   Suicidal/Homicidal: Nowithout intent/plan  Therapist Response: Assessed  pt current functioning per pt report. Processed w/pt coping through struggles of pain and working/parenting from home.  Discussed w/ pt self image and assisting w/ reframing negative self worth and connection w/ weight or shape.    Plan: Return again in 2 weeks, via webex.  Diagnosis: MDD, GAD   [name] [facility] 09/29/2018

## 2018-09-29 NOTE — Progress Notes (Signed)
[name] is a 38 y.o. female with the following history as recorded in Epic[employer]:  Patient Active Problem List   Diagnosis Date Noted  . Low back pain 07/04/2018  . 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 Major depressive disorder, recurrent severe without psychotic features ([place]) 09/16/2017  . Neck pain 08/14/2017  . Pre-diabetes 08/14/2017  . Nicotine dependence, cigarettes, uncomplicated [id number]  . Fatigue 11/03/2015  . Allergic rhinitis 05/16/2015  . S/P C-section 12/18/2014  . Hyperlipidemia 12/16/2014  . Hx of herpes simplex infection 11/29/2014  . Major depressive disorder, recurrent, severe without psychotic features ([place]) 09/23/2014  . GAD (generalized anxiety disorder) 09/23/2014  . Social anxiety disorder 09/23/2014  . Routine general medical examination at a health care facility 01/07/2014  . OSA on CPAP 11/26/2011  . Morbid obesity ([place]) 05/17/2011  . Patellofemoral syndrome of right knee 02/16/2011  . Depression 02/16/2011  . Hx gestational diabetes 02/16/2011  . Eczema 02/16/2011    Current Outpatient Medications  Medication Sig Dispense Refill  . busPIRone (BUSPAR) 10 MG tablet Take 1 tablet (10 mg total) by mouth 3 (three) times daily. For mood control (Patient taking differently: Take 10 mg by mouth 2 (two) times daily. For mood control) 270 tablet 0  . FLUoxetine (PROZAC) 20 MG capsule Take 3 capsules (60 mg total) by mouth daily. Mood control 270 capsule 0  . ibuprofen (ADVIL,MOTRIN) 600 MG tablet TAKE 1 TABLET (600 MG TOTAL) BY MOUTH EVERY 8 (EIGHT) HOURS AS NEEDED FOR MODERATE PAIN. 30 tablet 0   No current facility-administered medications for this visit.     Allergies: Codeine and Doxycycline  Past Medical History:  Diagnosis Date  . Anxiety   . Cluster headaches   . Depression   . Eczema   . GERD (gastroesophageal reflux disease)   . Gestational diabetes 2012, 2016  . Hyperlipidemia   . Hypertension    no meds currently  . Sciatica   .  Sleep apnea   . Snoring    sleep study 02/2012 with min AHI events    Past Surgical History:  Procedure Laterality Date  . CESAREAN SECTION  09 & 12   x's 2  . CESAREAN SECTION WITH BILATERAL TUBAL LIGATION Bilateral 12/18/2014   Procedure: REPEAT CESAREAN SECTION WITH BILATERAL TUBAL LIGATION;  Surgeon: Crawford Givens, MD;  Location: [facility];  Service: Obstetrics;  Laterality: Bilateral;  . COLPOSCOPY    . DENTAL SURGERY    . Ganglion removal from (L) wrist  1998  . LAPAROSCOPIC UNILATERAL SALPINGECTOMY Left 01/26/2015   Procedure: LAPAROSCOPIC UNILATERAL SALPINGECTOMY;  Surgeon: Crawford Givens, MD;  Location: [facility];  Service: Gynecology;  Laterality: Left;  . WISDOM TOOTH EXTRACTION      Family History  Problem Relation Age of Onset  . Arthritis Mother   . Alcohol abuse Mother   . Mental illness Mother   . Cancer Mother 92       squamous - unknown primary  . Coronary artery disease Mother 64       MI with stent  . Anxiety disorder Mother   . Depression Mother   . Arthritis Father   . Alcohol abuse Father   . Hyperlipidemia Father   . Heart disease Father   . Kidney disease Father   . Stroke Father   . Mental illness Father   . Drug abuse Father   . Diabetes Maternal Grandmother   . Heart disease Maternal Grandmother   . Stroke Maternal Grandfather   . Hypertension  Paternal Grandfather   . Hyperlipidemia Paternal Grandfather   . Arthritis Other   . Alcohol abuse Other   . Breast cancer Paternal Aunt   . Anxiety disorder Sister     Social History   Tobacco Use  . Smoking status: Current Every Day Smoker    Packs/day: 0.25    Years: 15.00    Pack years: 3.75    Types: Cigarettes  . Smokeless tobacco: Never Used  Substance Use Topics  . Alcohol use: Yes    Comment: once a month or less    Subjective:  Patient complaining of chronic pain " all over" x years; feels like symptoms present x 4-5 years;  has seen orthopedist and sports medicine with little benefit;  would be interested in seeing rheumatology- mother had arthritis; no known personal history of celiac disease but does know that her brother is following a gluten free diet due to concerns for ankylosing spondylitis; readily admits that weight is probably contributing- on the wait list to get into this program; smokes at least 1/4 ppd;  No concerns for sleep issues- wears CPAP;  Pain does affect bilateral joints including shoulders, elbows, ankles and low back; does not feel that joints are swollen, red in the mornings;    Objective:  Vitals:   09/29/18 0948  BP: (!) 146/84  Pulse: 85  Temp: 98.5 F (36.9 C)  TempSrc: Oral  SpO2: 97%  Weight: (!) 321 lb 3.2 oz (145.7 kg)  Height: '5\' 3"'  (1.6 m)    General: Well developed, well nourished, in no acute distress  Skin : Warm and dry.  Head: Normocephalic and atraumatic  Neck: Supple without thyromegaly, adenopathy  Lungs: Respirations unlabored; Musculoskeletal: No deformities; no active joint inflammation  Extremities: No edema, cyanosis, clubbing  Vessels: Symmetric bilaterally  Neurologic: Alert and oriented; speech intact; face symmetrical; moves all extremities well; CNII-XII intact without focal deficit   Assessment:  1. Myalgia   2. Weight gain   3. Arthralgia, unspecified joint   4. Elevated blood pressure reading     Plan:  ? Fibromyalgia vs underlying arthritis; also want to consider gout, vitamin deficiencies or celiac disease; will update labs today and will most likely need to refer to rheumatology; needs to quit smoking; already planning to meet with [facility]- weight is contributing; follow-up to be determined; will also need to consider low dose blood pressure medication;  Spent 30 minutes with patient; greater than 50% spent in counseling;     No follow-ups on file.  Orders Placed This Encounter  Procedures  . Antinuclear Antib (ANA)    Standing Status:   Future    Number of Occurrences:   1     Standing Expiration Date:   09/29/2019  . Sed Rate (ESR)    Standing Status:   Future    Number of Occurrences:   1    Standing Expiration Date:   09/29/2019  . Rheumatoid Factor    Standing Status:   Future    Number of Occurrences:   1    Standing Expiration Date:   09/29/2019  . CBC w/Diff    Standing Status:   Future    Number of Occurrences:   1    Standing Expiration Date:   09/29/2019  . Comp Met (CMET)    Standing Status:   Future    Number of Occurrences:   1    Standing Expiration Date:   09/29/2019  . B12  Standing Status:   Future    Number of Occurrences:   1    Standing Expiration Date:   09/29/2019  . Magnesium    Standing Status:   Future    Number of Occurrences:   1    Standing Expiration Date:   09/29/2019  . Celiac Disease Panel    Standing Status:   Future    Number of Occurrences:   1    Standing Expiration Date:   09/29/2019  . Vitamin D (25 hydroxy)    Standing Status:   Future    Number of Occurrences:   1    Standing Expiration Date:   09/29/2019  . HgB A1c    Standing Status:   Future    Number of Occurrences:   1    Standing Expiration Date:   09/29/2019  . TSH    Standing Status:   Future    Number of Occurrences:   1    Standing Expiration Date:   09/29/2019  . Uric acid    Standing Status:   Future    Number of Occurrences:   1    Standing Expiration Date:   09/29/2019    Requested Prescriptions    No prescriptions requested or ordered in this encounter

## 2018-10-01 ENCOUNTER — Other Ambulatory Visit: Payer: Self-pay | Admitting: Family

## 2018-10-01 DIAGNOSIS — M791 Myalgia, unspecified site: Secondary | ICD-10-CM

## 2018-10-01 DIAGNOSIS — Z8261 Family history of arthritis: Secondary | ICD-10-CM

## 2018-10-01 LAB — CELIAC DISEASE PANEL
(tTG) Ab, IgA: 1 U/mL
(tTG) Ab, IgG: 1 U/mL
Gliadin IgA: 5 Units
Gliadin IgG: 1 Units
Immunoglobulin A: 143 mg/dL (ref 47–310)

## 2018-10-01 LAB — RHEUMATOID FACTOR: Rheumatoid fact SerPl-aCnc: <14 IU/mL (ref <14)

## 2018-10-01 LAB — ANA: Anti Nuclear Antibody (ANA): NEGATIVE

## 2018-10-01 MED ORDER — VITAMIN D (ERGOCALCIFEROL) 1.25 MG (50000 UNIT) PO CAPS: 50000.0000 [IU] | ORAL_CAPSULE | ORAL | 0 refills | Status: AC

## 2018-10-10 DIAGNOSIS — Z8269 Family history of other diseases of the musculoskeletal system and connective tissue: Secondary | ICD-10-CM | POA: Diagnosis not present

## 2018-10-10 DIAGNOSIS — G894 Chronic pain syndrome: Secondary | ICD-10-CM | POA: Diagnosis not present

## 2018-10-10 DIAGNOSIS — M2550 Pain in unspecified joint: Secondary | ICD-10-CM | POA: Diagnosis not present

## 2018-10-10 DIAGNOSIS — M791 Myalgia, unspecified site: Secondary | ICD-10-CM | POA: Diagnosis not present

## 2018-10-13 ENCOUNTER — Other Ambulatory Visit: Payer: Self-pay

## 2018-10-13 ENCOUNTER — Ambulatory Visit (INDEPENDENT_AMBULATORY_CARE_PROVIDER_SITE_OTHER): Payer: Federal, State, Local not specified - PPO | Admitting: Psychology

## 2018-10-13 DIAGNOSIS — F411 Generalized anxiety disorder: Secondary | ICD-10-CM | POA: Diagnosis not present

## 2018-10-13 DIAGNOSIS — F330 Major depressive disorder, recurrent, mild: Secondary | ICD-10-CM

## 2018-10-13 NOTE — Progress Notes (Signed)
Virtual Visit via Video Note  I connected with [name] on 10/13/18 at 12:30 PM EDT by a video enabled telemedicine application and verified that I am speaking with the correct person using two identifiers.   I discussed the limitations of evaluation and management by telemedicine and the availability of in person appointments. The patient expressed understanding and agreed to proceed.    I discussed the assessment and treatment plan with the patient. The patient was provided an opportunity to ask questions and all were answered. The patient agreed with the plan and demonstrated an understanding of the instructions.   The patient was advised to call back or seek an in-person evaluation if the symptoms worsen or if the condition fails to improve as anticipated.  I provided 45 minutes of non-face-to-face time during this encounter.   [name] [facility]    THERAPIST PROGRESS NOTE  Session Time: 12.30pm-1.15pm  Participation Level: Active  Behavioral Response: Well GroomedAlertaffect wnl  Type of Therapy: Individual Therapy  Treatment Goals addressed: Diagnosis: MDD, GAd and goal 1.  Interventions: CBT and Supportive  Summary: [name] is a 38 y.o. female who presents with affect wnl.  Pt reported that she has been doing ok w/ mood.  Pt reported that has stressors w/ a lot of disruptions while working from home.  Pt reported that she was able to get her daughter's bed up and work in her room yesterday and this went better than anticipated.  Pt reported she was referred to rheumatologist and dx w/ osteoarthritis which will manage the pain.  Pt reported that r/o other things and potential of Fibromyalgia dx as well.  Pt reported that she has some intrusive thoughts about husbands past affair and bothered that has been coming up recent.  Pt was able to identifying some potential contributing factors and how to cope through and refocus on fostering positive interactions.   Pt reported that support her daughter through some anxiety and can be difficult to find ways of helping and acknowledging how she is.  Suicidal/Homicidal: Nowithout intent/plan  Therapist Response: Assessed pt current functioning per pt report.  Processed w/pt stressors and ways she is coping.  assisted pt in reflecting positives in how she is handling and focus on reframing negative thoughts patterns.  Focus on what she can impact.   Plan: Return again in 2 weeks, via webex.  F/u as scheduled w/ Dr. Doyne Keel  Diagnosis: MDD, GAD   [name], [facility] 10/13/2018

## 2018-10-22 DIAGNOSIS — Z87891 Personal history of nicotine dependence: Secondary | ICD-10-CM | POA: Diagnosis not present

## 2018-10-22 DIAGNOSIS — Z7289 Other problems related to lifestyle: Secondary | ICD-10-CM | POA: Diagnosis not present

## 2018-10-22 DIAGNOSIS — H60332 Swimmer's ear, left ear: Secondary | ICD-10-CM | POA: Diagnosis not present

## 2018-10-22 DIAGNOSIS — H9202 Otalgia, left ear: Secondary | ICD-10-CM | POA: Insufficient documentation

## 2018-10-27 ENCOUNTER — Ambulatory Visit (INDEPENDENT_AMBULATORY_CARE_PROVIDER_SITE_OTHER): Payer: Federal, State, Local not specified - PPO | Admitting: Bariatrics

## 2018-10-27 ENCOUNTER — Ambulatory Visit (INDEPENDENT_AMBULATORY_CARE_PROVIDER_SITE_OTHER): Payer: Federal, State, Local not specified - PPO | Admitting: Psychology

## 2018-10-27 ENCOUNTER — Other Ambulatory Visit: Payer: Self-pay

## 2018-10-27 ENCOUNTER — Encounter (INDEPENDENT_AMBULATORY_CARE_PROVIDER_SITE_OTHER): Payer: Self-pay | Admitting: Bariatrics

## 2018-10-27 ENCOUNTER — Encounter: Payer: Self-pay | Admitting: Bariatrics

## 2018-10-27 VITALS — BP 125/83 | HR 74 | Temp 98.6°F | Ht 63.0 in | Wt 315.0 lb

## 2018-10-27 DIAGNOSIS — Z9189 Other specified personal risk factors, not elsewhere classified: Secondary | ICD-10-CM | POA: Diagnosis not present

## 2018-10-27 DIAGNOSIS — R5383 Other fatigue: Secondary | ICD-10-CM

## 2018-10-27 DIAGNOSIS — Z1331 Encounter for screening for depression: Secondary | ICD-10-CM | POA: Diagnosis not present

## 2018-10-27 DIAGNOSIS — Z8632 Personal history of gestational diabetes: Secondary | ICD-10-CM

## 2018-10-27 DIAGNOSIS — Z6841 Body Mass Index (BMI) 40.0 and over, adult: Secondary | ICD-10-CM

## 2018-10-27 DIAGNOSIS — F330 Major depressive disorder, recurrent, mild: Secondary | ICD-10-CM

## 2018-10-27 DIAGNOSIS — E7849 Other hyperlipidemia: Secondary | ICD-10-CM | POA: Diagnosis not present

## 2018-10-27 DIAGNOSIS — E559 Vitamin D deficiency, unspecified: Secondary | ICD-10-CM

## 2018-10-27 DIAGNOSIS — F411 Generalized anxiety disorder: Secondary | ICD-10-CM | POA: Diagnosis not present

## 2018-10-27 DIAGNOSIS — H60332 Swimmer's ear, left ear: Secondary | ICD-10-CM | POA: Diagnosis not present

## 2018-10-27 DIAGNOSIS — E538 Deficiency of other specified B group vitamins: Secondary | ICD-10-CM

## 2018-10-27 DIAGNOSIS — R748 Abnormal levels of other serum enzymes: Secondary | ICD-10-CM

## 2018-10-27 DIAGNOSIS — R0602 Shortness of breath: Secondary | ICD-10-CM

## 2018-10-27 NOTE — Progress Notes (Signed)
Virtual Visit via Video Note  I connected with [name] on 10/27/18 at 12:30 PM EDT by a video enabled telemedicine application and verified that I am speaking with the correct person using two identifiers.   I discussed the limitations of evaluation and management by telemedicine and the availability of in person appointments. The patient expressed understanding and agreed to proceed.    I discussed the assessment and treatment plan with the patient. The patient was provided an opportunity to ask questions and all were answered. The patient agreed with the plan and demonstrated an understanding of the instructions.   The patient was advised to call back or seek an in-person evaluation if the symptoms worsen or if the condition fails to improve as anticipated.  I provided 33 minutes of non-face-to-face time during this encounter.   [name] [place]    THERAPIST PROGRESS NOTE  Session Time: 12.30pm-1.03pm  Participation Level: Active  Behavioral Response: Well GroomedAlertaffect wnl  Type of Therapy: Individual Therapy  Treatment Goals addressed: Diagnosis: MDD and goal 1.  Interventions: CBT and Strength-based  Summary: [name] is a 38 y.o. female who presents with affect wnl.  Pt reported that she has to end appt early as has f/u w/ ENT.  Pt reported that she has met w/ weight management this morning and that went well- has realistic goals and plan to follow for next 2 weeks.  Pt acknowledge barrier that she doesn't know how to cook differently and will have to explore. Pt also reported that she feels relief with the Celebrex she is taking and is able to move around w/out pain.  Pt reported that overall she feels she is doing well.  Pt did report stress of ER trip w/ son's fall last week and combating negative self talk about guilt when not her fault.  Pt was able to reframe.  Suicidal/Homicidal: Nowithout intent/plan  Therapist Response: Assessed pt current  functioning per pt report.  Processed w/pt coping w/ stressors and steps towards wellness.  Reflected pt good awareness and steps she is taking.  Assisted w/ pt reframing negative self talk.  Plan: Return again in 2 weeks, via webex.  F/u as scheduled w/ Dr. Doyne [name], her weight management, PCP and other providers. Diagnosis: MDD and GAD   [name] [facility] 10/27/2018

## 2018-10-28 LAB — INSULIN, RANDOM: INSULIN: 15.4 u[IU]/mL (ref 2.6–24.9)

## 2018-10-28 LAB — VITAMIN B12: Vitamin B-12: 391 pg/mL (ref 232–1245)

## 2018-10-28 NOTE — Progress Notes (Signed)
Office: [phone]  /  Fax: [phone]   Dear [name] Chestnut, NP,   Thank you for referring [name] to our clinic. The following note includes my evaluation and treatment recommendations.  HPI:   Chief Complaint: OBESITY    [name] has been referred by [name] Chestnut, NP for consultation regarding her obesity and obesity related comorbidities.    [name] (MR# [id number]) is a 38 y.o. female who presents on 10/28/2018 for obesity evaluation and treatment. Current BMI is Body mass index is 55.8 kg/m.Marland Kitchen [name] has been struggling with her weight for many years and has been unsuccessful in either losing weight, maintaining weight loss, or reaching her healthy weight goal.     [name] attended our information session and states she is currently in the action stage of change and ready to dedicate time achieving and maintaining a healthier weight. [name] is interested in becoming our patient and working on intensive lifestyle modifications including (but not limited to) diet, exercise and weight loss.     [name] does not like to cook. She does crave sweets and craves food in the afternoon. She states she skips breakfast everyday and reports doing some mindless eating.    [name] states her family eats meals together she thinks her family will eat healthier with her her desired weight loss is 140 lbs she has been heavy most of  her life she started gaining weight after childbirth her heaviest weight ever was 315 lbs. she has significant food cravings issues  she skips breakfast everyday she is rarely drinking liquids with calories she sometimes eats larger portions than normal  she has binge eating behaviors she struggles with emotional eating    Fatigue [name] feels her energy is lower than it should be. This has worsened with weight gain and has worsened recently. [name] denies daytime somnolence and  denies waking up still tired. Patient  generally gets 8-9 hours of sleep per night, and states they generally have restful sleep. Snoring is present. Apneic episodes are not present. Epworth Sleepiness Score is 10.  Dyspnea on exertion [name] notes increasing shortness of breath with exercising and seems to be worsening over time with weight gain. She notes getting out of breath sooner with activity than she used to. This has gotten worse recently. [name] denies orthopnea.  Obstructive Sleep Apnea (OSA), on CPAP [name] uses a CPAP and reports sleeping well.  History of Gestational Diabetes Mellitus [name] reports having gestational diabetes mellitus in the second trimester of her last two pregnancies. She had a prediabetes A1c level of 6.1 on 09/29/2018.  At risk for diabetes [name] is at higher than average risk for developing diabetes due to her obesity. She currently denies polyuria or polydipsia.  Vitamin D deficiency [name] has a diagnosis of Vitamin D deficiency. Last Vitamin D 20.59 on 09/29/2018. She is currently taking prescription Vit D 50,000 units weekly and denies nausea, vomiting or muscle weakness.  Hyperlipidemia [name] has hyperlipidemia and has been trying to improve her cholesterol levels with intensive lifestyle modification including a low saturated fat diet, exercise and weight loss. She is on no medication and denies any chest pain, claudication or myalgias.  Low HDL [name] has an elevated triglyceride level of 158.31.  Vitamin B12 deficiency [name] has a diagnosis of B12 insufficiency and is on no medications.  Depression Screen [name] Food and Mood (modified PHQ-9) score was 21. Depression screen PHQ 2/9 10/27/2018  Decreased Interest 3  Down, Depressed, Hopeless 2  PHQ - 2 Score 5  Altered sleeping 3  Tired, decreased energy 3  Change in appetite 2  Feeling bad or failure about yourself  3  Trouble concentrating 1  Moving slowly or fidgety/restless 3  Suicidal thoughts 1  PHQ-9  Score 21  Difficult doing work/chores Very difficult    ASSESSMENT AND PLAN:  Other fatigue - Plan: EKG 12-Lead, Insulin, random  Shortness of breath on exertion  Vitamin D deficiency  Other hyperlipidemia  Low serum HDL  B12 nutritional deficiency - Plan: Vitamin B12  History of gestational diabetes  Depression screening  At risk for diabetes mellitus  Class 3 severe obesity with serious comorbidity and body mass index (BMI) of 50.0 to 59.9 in adult, unspecified obesity type (HCC)  PLAN:  Fatigue [name] was informed that her fatigue may be related to obesity, depression or many other causes. Labs will be ordered, and in the meanwhile [name] has agreed to work on diet, exercise and weight loss to help with fatigue. Proper sleep hygiene was discussed including the need for 7-8 hours of quality sleep each night. A sleep study was not ordered based on symptoms and Epworth score. [name] will continue and gradually increase activities.  Dyspnea on exertion [name] shortness of breath appears to be obesity related and exercise induced. She has agreed to work on weight loss and gradually increase exercise to treat her exercise induced shortness of breath. If [name] follows our instructions and loses weight without improvement of her shortness of breath, we will plan to refer to pulmonology. We will monitor this condition regularly. [name] agrees to this plan.  Obstructive Sleep Apnea (OSA), on CPAP [name] will continue using her CPAP.  History of Gestational Diabetes Mellitus [name] was instructed to decrease carbohydrates, increase protein, and increase activities. She will follow-up with [place] as directed to monitor her progress.  Diabetes risk counseling [name] was given extended (15 minutes) diabetes prevention counseling today. She is 38 y.o. female and has risk factors for diabetes including obesity. We discussed intensive lifestyle modifications today with an emphasis  on weight loss as well as increasing exercise and decreasing simple carbohydrates in her diet.  Vitamin D Deficiency [name] was informed that low Vitamin D levels contributes to fatigue and are associated with obesity, breast, and colon cancer. She agrees to continue to take prescription Vit D @ 50,000 IU every week and will follow-up for routine testing of Vitamin D, at least 2-3 times per year. She was informed of the risk of over-replacement of Vitamin D and agrees to not increase her dose unless she discusses this with [place] first. [name] agrees to follow-up with our clinic in 2 weeks.  Hyperlipidemia [name] was informed of the American Heart Association Guidelines emphasizing intensive lifestyle modifications as the first line treatment for hyperlipidemia. We discussed many lifestyle modifications today in depth, and [name] will continue to work on decreasing saturated fats such as fatty red meat, butter and many fried foods. We discussed increasing protein, healthy fats, and increasing activity. [name] will also increase vegetables and lean protein in her diet and continue to work on exercise and weight loss efforts.  Low HDL Yoselyn was advised to increase exercise and work on weight loss.  Vitamin B12 deficiency Cherye will have Vitamin B12 level checked and will follow-up in 2 weeks to review lab results.  Depression Screen Shruti had a strongly positive depression screening. Depression is commonly associated with obesity and often results in emotional eating behaviors. We will monitor this  closely and work on CBT to help improve the non-hunger eating patterns. Referral to Psychology may be required if no improvement is seen as she continues in our clinic.  Obesity Zoei is currently in the action stage of change and her goal is to continue with weight loss efforts. I recommend Graelyn begin the structured treatment plan as follows:  She has agreed to follow the Category 3 plan.  Marlana will work on meal planning, intentional eating, and portioning out amounts of food.  Ave has been instructed to eventually work up to a goal of 150 minutes of combined cardio and strengthening exercise per week for weight loss and overall health benefits. We discussed the following Behavioral Modification Strategies today: increasing lean protein intake, decreasing simple carbohydrates, increasing vegetables, increase H20 intake, decrease eating out, no skipping meals, work on meal planning and easy cooking plans, keeping healthy foods in the home, and planning for success.   She was informed of the importance of frequent follow-up visits to maximize her success with intensive lifestyle modifications for her multiple health conditions. She was informed we would discuss her lab results at her next visit unless there is a critical issue that needs to be addressed sooner. Parthenia agreed to keep her next visit at the agreed upon time to discuss these results.  ALLERGIES: Allergies  Allergen Reactions  . Codeine     STOMACH CRAMPING  . Doxycycline Nausea And Vomiting    MEDICATIONS: Current Outpatient Medications on File Prior to Visit  Medication Sig Dispense Refill  . busPIRone (BUSPAR) 10 MG tablet Take 1 tablet (10 mg total) by mouth 3 (three) times daily. For mood control (Patient taking differently: Take 10 mg by mouth 2 (two) times daily. For mood control) 270 tablet 0  . celecoxib (CELEBREX) 200 MG capsule Take 200 mg by mouth daily.    Marland Kitchen FLUoxetine (PROZAC) 20 MG capsule Take 3 capsules (60 mg total) by mouth daily. Mood control 270 capsule 0  . Vitamin D, Ergocalciferol, (DRISDOL) 1.25 MG (50000 UT) CAPS capsule Take 1 capsule (50,000 Units total) by mouth every 7 (seven) days for 12 doses. 12 capsule 0   No current facility-administered medications on file prior to visit.     PAST MEDICAL HISTORY: Past Medical History:  Diagnosis Date  . Anxiety   . Back pain   .  Bilateral swelling of feet   . Chest pain   . Cluster headaches   . Depression   . Eczema   . GERD (gastroesophageal reflux disease)   . Gestational diabetes 2012, 2016  . Hyperlipidemia   . Hypertension    no meds currently  . Joint pain   . Osteoarthritis   . PCOS (polycystic ovarian syndrome)   . Pre-diabetes   . Sciatica   . Shortness of breath   . Sleep apnea   . Snoring    sleep study 02/2012 with min AHI events  . Vitamin D deficiency     PAST SURGICAL HISTORY: Past Surgical History:  Procedure Laterality Date  . CESAREAN SECTION  09 & 12   x's 2  . CESAREAN SECTION WITH BILATERAL TUBAL LIGATION Bilateral 12/18/2014   Procedure: REPEAT CESAREAN SECTION WITH BILATERAL TUBAL LIGATION;  Surgeon: Crawford Givens, MD;  Location: [facility];  Service: Obstetrics;  Laterality: Bilateral;  . COLPOSCOPY    . DENTAL SURGERY    . Ganglion removal from (L) wrist  1998  . LAPAROSCOPIC UNILATERAL SALPINGECTOMY Left 01/26/2015   Procedure: LAPAROSCOPIC UNILATERAL  SALPINGECTOMY;  Surgeon: Crawford Givens, MD;  Location: [facility];  Service: Gynecology;  Laterality: Left;  . WISDOM TOOTH EXTRACTION      SOCIAL HISTORY: Social History   Tobacco Use  . Smoking status: Current Every Day Smoker    Packs/day: 0.25    Years: 15.00    Pack years: 3.75    Types: Cigarettes  . Smokeless tobacco: Never Used  Substance Use Topics  . Alcohol use: Yes    Comment: once a month or less  . Drug use: No    FAMILY HISTORY: Family History  Problem Relation Age of Onset  . Arthritis Mother   . Alcohol abuse Mother   . Mental illness Mother   . Cancer Mother 33       squamous - unknown primary  . Coronary artery disease Mother 83       MI with stent  . Anxiety disorder Mother   . Depression Mother   . Diabetes Mother   . Obesity Mother   . Arthritis Father   . Alcohol abuse Father   . Hyperlipidemia Father   . Heart disease Father   . Kidney disease Father   . Stroke Father   . Mental  illness Father   . Drug abuse Father   . Hypertension Father   . Depression Father   . Alcoholism Father   . Diabetes Maternal Grandmother   . Heart disease Maternal Grandmother   . Stroke Maternal Grandfather   . Hypertension Paternal Grandfather   . Hyperlipidemia Paternal Grandfather   . Arthritis Other   . Alcohol abuse Other   . Breast cancer Paternal Aunt   . Anxiety disorder Sister    ROS: Review of Systems  Constitutional: Positive for malaise/fatigue.  HENT: Positive for tinnitus.   Eyes:       Positive for wearing glasses or contacts.  Respiratory: Positive for shortness of breath (with activity).        Positive for OSA, on CPAP.  Cardiovascular: Negative for chest pain, orthopnea and claudication.  Gastrointestinal: Positive for heartburn. Negative for nausea and vomiting.  Musculoskeletal: Positive for back pain and joint pain. Negative for myalgias.       Negative for muscle weakness. Positive for red or swollen joints.  Neurological: Positive for headaches.  Psychiatric/Behavioral: Positive for depression.       Positive for stress.   PHYSICAL EXAM: Blood pressure 125/83, pulse 74, temperature 98.6 F (37 C), temperature source Oral, height 5\' 3"  (1.6 m), weight (!) 315 lb (142.9 kg), last menstrual period 10/18/2018, SpO2 97 %, unknown if currently breastfeeding. Body mass index is 55.8 kg/m. Physical Exam Vitals signs reviewed.  Constitutional:      Appearance: Normal appearance. She is well-developed. She is obese.  HENT:     Head: Normocephalic and atraumatic.     Nose: Nose normal.  Eyes:     General: No scleral icterus. Neck:     Musculoskeletal: Normal range of motion.  Cardiovascular:     Rate and Rhythm: Normal rate and regular rhythm.  Pulmonary:     Effort: Pulmonary effort is normal. No respiratory distress.  Abdominal:     Palpations: Abdomen is soft.     Tenderness: There is no abdominal tenderness.  Musculoskeletal: Normal range of  motion.     Comments: Range of motion normal in all four extremities.  Skin:    General: Skin is warm and dry.  Neurological:     Mental Status: She is  alert and oriented to person, place, and time.     Coordination: Coordination normal.  Psychiatric:        Mood and Affect: Mood and affect normal.        Behavior: Behavior normal.   RECENT LABS AND TESTS: BMET    Component Value Date/Time   NA 138 09/29/2018 1025   K 4.0 09/29/2018 1025   CL 103 09/29/2018 1025   CO2 29 09/29/2018 1025   GLUCOSE 98 09/29/2018 1025   BUN 11 09/29/2018 1025   CREATININE 0.91 09/29/2018 1025   CALCIUM 9.3 09/29/2018 1025   GFRNONAA >60 09/15/2017 1612   GFRAA >60 09/15/2017 1612   Lab Results  Component Value Date   HGBA1C 6.1 09/29/2018   Lab Results  Component Value Date   INSULIN 15.4 10/27/2018   CBC    Component Value Date/Time   WBC 6.7 09/29/2018 1025   RBC 4.33 09/29/2018 1025   HGB 12.6 09/29/2018 1025   HCT 37.8 09/29/2018 1025   PLT 292.0 09/29/2018 1025   MCV 87.3 09/29/2018 1025   MCH 29.3 09/15/2017 1612   MCHC 33.3 09/29/2018 1025   RDW 14.0 09/29/2018 1025   LYMPHSABS 1.5 09/29/2018 1025   MONOABS 0.3 09/29/2018 1025   EOSABS 0.1 09/29/2018 1025   BASOSABS 0.0 09/29/2018 1025   Iron/TIBC/Ferritin/ %Sat No results found for: IRON, TIBC, FERRITIN, IRONPCTSAT Lipid Panel     Component Value Date/Time   CHOL 192 08/14/2017 0851   TRIG 150.0 (H) 08/14/2017 0851   TRIG 104 03/13/2016 0958   HDL 33.90 (L) 08/14/2017 0851   HDL 52 03/13/2016 0958   CHOLHDL 6 08/14/2017 0851   VLDL 30.0 08/14/2017 0851   LDLCALC 128 (H) 08/14/2017 0851   LDLDIRECT 111.5 09/09/2012 0739   Hepatic Function Panel     Component Value Date/Time   PROT 6.9 09/29/2018 1025   ALBUMIN 4.0 09/29/2018 1025   AST 13 09/29/2018 1025   ALT 18 09/29/2018 1025   ALKPHOS 91 09/29/2018 1025   BILITOT 0.4 09/29/2018 1025   BILIDIR 0.0 09/09/2012 0739      Component Value Date/Time    TSH 3.37 09/29/2018 1025   TSH 2.51 08/14/2017 0851   TSH 1.34 11/03/2015 1536   Results for KENADY, PAULINO (MRN [id number]) as of 10/28/2018 16:29  Ref. Range 09/29/2018 10:25  VITD Latest Ref Range: 30.00 - 100.00 ng/mL 20.59 (L)    ECG  shows sinus rhythm with a rate of 72 BPM. Low voltage in precordial leads. Poor R-wave progression -may be secondary to body habitus. Nonspecific T-abnormality. Otherwise normal.   INDIRECT CALORIMETER done today shows a VO2 of 265 and a REE of 1845.  Her calculated basal metabolic rate is [id number] thus her basal metabolic rate is worse than expected.  OBESITY BEHAVIORAL INTERVENTION VISIT  Today's visit was #1  Starting weight: 315 lbs Starting date: 10/27/2018 Today's weight: 315 lbs  Today's date: 10/27/2018 Total lbs lost to date: 0    10/27/2018  Height 5\' 3"  (1.6 m)  Weight 315 lb (142.9 kg) (A)  BMI (Calculated) 55.81  BLOOD PRESSURE - SYSTOLIC 0000000  BLOOD PRESSURE - DIASTOLIC 83  Waist Measurement  56 inches   Body Fat % 54.7 %  Total Body Water (lbs) 101.6 lbs  RMR 1845   ASK: We discussed the diagnosis of obesity with Anabelle R Lambing today and Kryslyn agreed to give [place] permission to discuss obesity behavioral modification therapy today.  ASSESS: Kortlynn has  the diagnosis of obesity and her BMI today is 55.9. Sol is in the action stage of change.   ADVISE: Jameshia was educated on the multiple health risks of obesity as well as the benefit of weight loss to improve her health. She was advised of the need for long term treatment and the importance of lifestyle modifications to improve her current health and to decrease her risk of future health problems.  AGREE: Multiple dietary modification options and treatment options were discussed and  Lummie agreed to follow the recommendations documented in the above note.  ARRANGE: Kaydee was educated on the importance of frequent visits to treat obesity as outlined per CMS and  USPSTF guidelines and agreed to schedule her next follow up appointment today.  Migdalia Dk, am acting as Location manager for [employer], DO  I have reviewed the above documentation for accuracy and completeness, and I agree with the above. -Jearld Lesch, DO

## 2018-10-29 ENCOUNTER — Encounter (INDEPENDENT_AMBULATORY_CARE_PROVIDER_SITE_OTHER): Payer: Self-pay | Admitting: Bariatrics

## 2018-10-29 NOTE — Addendum Note (Signed)
Addended by: Jearld Lesch A on: 10/29/2018 09:00 AM   Modules accepted: Level of Service

## 2018-11-10 ENCOUNTER — Ambulatory Visit (INDEPENDENT_AMBULATORY_CARE_PROVIDER_SITE_OTHER): Payer: Federal, State, Local not specified - PPO | Admitting: Psychology

## 2018-11-10 ENCOUNTER — Ambulatory Visit (INDEPENDENT_AMBULATORY_CARE_PROVIDER_SITE_OTHER): Payer: Federal, State, Local not specified - PPO | Admitting: Bariatrics

## 2018-11-10 ENCOUNTER — Other Ambulatory Visit: Payer: Self-pay

## 2018-11-10 ENCOUNTER — Encounter (INDEPENDENT_AMBULATORY_CARE_PROVIDER_SITE_OTHER): Payer: Self-pay | Admitting: Bariatrics

## 2018-11-10 VITALS — BP 126/80 | HR 80 | Temp 98.5°F | Ht 63.0 in | Wt 311.0 lb

## 2018-11-10 DIAGNOSIS — Z9189 Other specified personal risk factors, not elsewhere classified: Secondary | ICD-10-CM

## 2018-11-10 DIAGNOSIS — E559 Vitamin D deficiency, unspecified: Secondary | ICD-10-CM

## 2018-11-10 DIAGNOSIS — E538 Deficiency of other specified B group vitamins: Secondary | ICD-10-CM

## 2018-11-10 DIAGNOSIS — F330 Major depressive disorder, recurrent, mild: Secondary | ICD-10-CM

## 2018-11-10 DIAGNOSIS — Z6841 Body Mass Index (BMI) 40.0 and over, adult: Secondary | ICD-10-CM

## 2018-11-10 DIAGNOSIS — F411 Generalized anxiety disorder: Secondary | ICD-10-CM

## 2018-11-10 DIAGNOSIS — R7303 Prediabetes: Secondary | ICD-10-CM

## 2018-11-10 NOTE — Progress Notes (Signed)
Office: [phone]  /  Fax: [phone]   HPI:   Chief Complaint: OBESITY [name] is here to discuss her progress with her obesity treatment plan. She is on the Category 3 plan and is following her eating plan approximately 85 % of the time. She states she is exercising 0 minutes 0 times per week. Mckinsey is down 4 pounds and she is doing well. She states she is getting used to the [place]. Her weight is (!) 311 lb (141.1 kg) today and has had a weight loss of 4 pounds over a period of 2 weeks since her last visit. She has lost 4 lbs since starting treatment with [place].  Pre-Diabetes [name] has a diagnosis of prediabetes based on her elevated Hgb A1c and was informed this puts her at greater risk of developing diabetes. [name] has a history of gestational diabetes. Her last A1c was at 6.1 and last insulin level was at 15.4. She is not taking medications currently and continues to work on diet and exercise to decrease risk of diabetes. She denies nausea or hypoglycemia.  At risk for diabetes [name] is at higher than average risk for developing diabetes due to her obesity and prediabetes. She currently denies polyuria or polydipsia.  Vitamin D deficiency [name] has a diagnosis of vitamin D deficiency. Her last vitamin D level was at 20.59 (09/29/18). She is currently taking high dose vit D and denies nausea, vomiting or muscle weakness.  B12 Deficiency [name] has a diagnosis of B12 insufficiency and she is currently okay. Her last value was low   (391 on 10/27/18). [name] is not currently taking B12 supplement.   ASSESSMENT AND PLAN:  Prediabetes  Vitamin D deficiency  B12 nutritional deficiency  At risk for diabetes mellitus  Class 3 severe obesity with serious comorbidity and body mass index (BMI) of 50.0 to 59.9 in adult, unspecified obesity type ([place])  PLAN:  Pre-Diabetes Ricki will continue to work on weight loss, exercise, and decreasing simple carbohydrates in her  diet to help decrease the risk of diabetes. She was informed that eating too many simple carbohydrates or too many calories at one sitting increases the likelihood of GI side effects. Handouts for prediabetes and insulin resistance, were given to patient today. Henleigh agreed to follow up with [place] as directed to monitor her progress.  Diabetes risk counseling Arriah was given extended (15 minutes) diabetes prevention counseling today. She is 38 y.o. female and has risk factors for diabetes including obesity and prediabetes. We discussed intensive lifestyle modifications today with an emphasis on weight loss as well as increasing exercise and decreasing simple carbohydrates in her diet.  Vitamin D Deficiency Kinlie was informed that low vitamin D levels contributes to fatigue and are associated with obesity, breast, and colon cancer. Brilyn will continue to take high dose prescription Vit D @50 ,000 IU every week and she will follow up for routine testing of vitamin D, at least 2-3 times per year. She was informed of the risk of over-replacement of vitamin D and agrees to not increase her dose unless she discusses this with [place] first.  B12 Deficiency Adina will work on increasing B12 rich foods in her diet. B12 supplementation was not prescribed today. She will consider OTC B12 supplement.   Obesity [name] is currently in the action stage of change. As such, her goal is to continue with weight loss efforts She has agreed to follow the Category 3 plan Suzi will increase activity for weight loss and overall health  benefits. We discussed the following Behavioral Modification Strategies today: planning for success, increase H2O intake, increase exercise, substitutions for meat, no skipping meals, increasing lean protein intake, decreasing simple carbohydrates, increasing vegetables, decrease eating out, work on meal planning and easy cooking plans, ways to avoid night time snacking and decrease  liquid calories  Shantelle has agreed to follow up with our clinic in 2 to 3 weeks. She was informed of the importance of frequent follow up visits to maximize her success with intensive lifestyle modifications for her multiple health conditions.  ALLERGIES: Allergies  Allergen Reactions  . Codeine     STOMACH CRAMPING  . Doxycycline Nausea And Vomiting    MEDICATIONS: Current Outpatient Medications on File Prior to Visit  Medication Sig Dispense Refill  . busPIRone (BUSPAR) 10 MG tablet Take 1 tablet (10 mg total) by mouth 3 (three) times daily. For mood control (Patient taking differently: Take 10 mg by mouth 2 (two) times daily. For mood control) 270 tablet 0  . celecoxib (CELEBREX) 200 MG capsule Take 200 mg by mouth daily.    Marland Kitchen FLUoxetine (PROZAC) 20 MG capsule Take 3 capsules (60 mg total) by mouth daily. Mood control 270 capsule 0  . Vitamin D, Ergocalciferol, (DRISDOL) 1.25 MG (50000 UT) CAPS capsule Take 1 capsule (50,000 Units total) by mouth every 7 (seven) days for 12 doses. 12 capsule 0   No current facility-administered medications on file prior to visit.     PAST MEDICAL HISTORY: Past Medical History:  Diagnosis Date  . Anxiety   . Back pain   . Bilateral swelling of feet   . Chest pain   . Cluster headaches   . Depression   . Eczema   . GERD (gastroesophageal reflux disease)   . Gestational diabetes 2012, 2016  . Hyperlipidemia   . Hypertension    no meds currently  . Joint pain   . Osteoarthritis   . PCOS (polycystic ovarian syndrome)   . Pre-diabetes   . Sciatica   . Shortness of breath   . Sleep apnea   . Snoring    sleep study 02/2012 with min AHI events  . Vitamin D deficiency     PAST SURGICAL HISTORY: Past Surgical History:  Procedure Laterality Date  . CESAREAN SECTION  09 & 12   x's 2  . CESAREAN SECTION WITH BILATERAL TUBAL LIGATION Bilateral 12/18/2014   Procedure: REPEAT CESAREAN SECTION WITH BILATERAL TUBAL LIGATION;  Surgeon: Crawford Givens, MD;  Location: [facility];  Service: Obstetrics;  Laterality: Bilateral;  . COLPOSCOPY    . DENTAL SURGERY    . Ganglion removal from (L) wrist  1998  . LAPAROSCOPIC UNILATERAL SALPINGECTOMY Left 01/26/2015   Procedure: LAPAROSCOPIC UNILATERAL SALPINGECTOMY;  Surgeon: Crawford Givens, MD;  Location: [facility];  Service: Gynecology;  Laterality: Left;  . WISDOM TOOTH EXTRACTION      SOCIAL HISTORY: Social History   Tobacco Use  . Smoking status: Current Every Day Smoker    Packs/day: 0.25    Years: 15.00    Pack years: 3.75    Types: Cigarettes  . Smokeless tobacco: Never Used  Substance Use Topics  . Alcohol use: Yes    Comment: once a month or less  . Drug use: No    FAMILY HISTORY: Family History  Problem Relation Age of Onset  . Arthritis Mother   . Alcohol abuse Mother   . Mental illness Mother   . Cancer Mother 52  squamous - unknown primary  . Coronary artery disease Mother 66       MI with stent  . Anxiety disorder Mother   . Depression Mother   . Diabetes Mother   . Obesity Mother   . Arthritis Father   . Alcohol abuse Father   . Hyperlipidemia Father   . Heart disease Father   . Kidney disease Father   . Stroke Father   . Mental illness Father   . Drug abuse Father   . Hypertension Father   . Depression Father   . Alcoholism Father   . Diabetes Maternal Grandmother   . Heart disease Maternal Grandmother   . Stroke Maternal Grandfather   . Hypertension Paternal Grandfather   . Hyperlipidemia Paternal Grandfather   . Arthritis Other   . Alcohol abuse Other   . Breast cancer Paternal Aunt   . Anxiety disorder Sister     ROS: Review of Systems  Constitutional: Positive for weight loss.  Gastrointestinal: Negative for nausea and vomiting.  Genitourinary: Negative for frequency.  Musculoskeletal:       Negative for muscle weakness  Endo/Heme/Allergies: Negative for polydipsia.       Negative for hypoglycemia    PHYSICAL EXAM: Blood  pressure 126/80, pulse 80, temperature 98.5 F (36.9 C), temperature source Oral, height 5\' 3"  (1.6 m), weight (!) 311 lb (141.1 kg), last menstrual period 10/18/2018, SpO2 96 %, unknown if currently breastfeeding. Body mass index is 55.09 kg/m. Physical Exam Vitals signs reviewed.  Constitutional:      Appearance: Normal appearance. She is well-developed. She is obese.  Cardiovascular:     Rate and Rhythm: Normal rate.  Pulmonary:     Effort: Pulmonary effort is normal.  Musculoskeletal: Normal range of motion.  Skin:    General: Skin is warm and dry.  Neurological:     Mental Status: She is alert and oriented to person, place, and time.  Psychiatric:        Mood and Affect: Mood normal.        Behavior: Behavior normal.     RECENT LABS AND TESTS: BMET    Component Value Date/Time   NA 138 09/29/2018 1025   K 4.0 09/29/2018 1025   CL 103 09/29/2018 1025   CO2 29 09/29/2018 1025   GLUCOSE 98 09/29/2018 1025   BUN 11 09/29/2018 1025   CREATININE 0.91 09/29/2018 1025   CALCIUM 9.3 09/29/2018 1025   GFRNONAA >60 09/15/2017 1612   GFRAA >60 09/15/2017 1612   Lab Results  Component Value Date   HGBA1C 6.1 09/29/2018   HGBA1C 5.9 08/14/2017   HGBA1C 5.8 03/13/2016   HGBA1C 5.5 05/16/2015   HGBA1C 6.1 01/07/2014   Lab Results  Component Value Date   INSULIN 15.4 10/27/2018   CBC    Component Value Date/Time   WBC 6.7 09/29/2018 1025   RBC 4.33 09/29/2018 1025   HGB 12.6 09/29/2018 1025   HCT 37.8 09/29/2018 1025   PLT 292.0 09/29/2018 1025   MCV 87.3 09/29/2018 1025   MCH 29.3 09/15/2017 1612   MCHC 33.3 09/29/2018 1025   RDW 14.0 09/29/2018 1025   LYMPHSABS 1.5 09/29/2018 1025   MONOABS 0.3 09/29/2018 1025   EOSABS 0.1 09/29/2018 1025   BASOSABS 0.0 09/29/2018 1025   Iron/TIBC/Ferritin/ %Sat No results found for: IRON, TIBC, FERRITIN, IRONPCTSAT Lipid Panel     Component Value Date/Time   CHOL 192 08/14/2017 0851   TRIG 150.0 (H) 08/14/2017 [id number]  TRIG 104 03/13/2016 0958   HDL 33.90 (L) 08/14/2017 0851   HDL 52 03/13/2016 0958   CHOLHDL 6 08/14/2017 0851   VLDL 30.0 08/14/2017 0851   LDLCALC 128 (H) 08/14/2017 0851   LDLDIRECT 111.5 09/09/2012 0739   Hepatic Function Panel     Component Value Date/Time   PROT 6.9 09/29/2018 1025   ALBUMIN 4.0 09/29/2018 1025   AST 13 09/29/2018 1025   ALT 18 09/29/2018 1025   ALKPHOS 91 09/29/2018 1025   BILITOT 0.4 09/29/2018 1025   BILIDIR 0.0 09/09/2012 0739      Component Value Date/Time   TSH 3.37 09/29/2018 1025   TSH 2.51 08/14/2017 0851   TSH 1.34 11/03/2015 1536    Results for KIJAH, POMPILIO (MRN [id number]) as of 11/10/2018 09:14  Ref. Range 09/29/2018 10:25  VITD Latest Ref Range: 30.00 - 100.00 ng/mL 20.59 (L)    OBESITY BEHAVIORAL INTERVENTION VISIT  Today's visit was # 2   Starting weight: 315 lbs Starting date: 10/27/2018 Today's weight : 311 lbs Today's date: 11/10/2018 Total lbs lost to date: 4    11/10/2018  Height 5\' 3"  (1.6 m)  Weight 311 lb (141.1 kg) (A)  BMI (Calculated) 55.11  BLOOD PRESSURE - SYSTOLIC [id number]  BLOOD PRESSURE - DIASTOLIC 80   Body Fat % 99991111 %  Total Body Water (lbs) 101.4 lbs    ASK: We discussed the diagnosis of obesity with Namiko R Petrak today and Tacha agreed to give [place] permission to discuss obesity behavioral modification therapy today.  ASSESS: Brinley has the diagnosis of obesity and her BMI today is 55.11 Svara is in the action stage of change   ADVISE: Dalisa was educated on the multiple health risks of obesity as well as the benefit of weight loss to improve her health. She was advised of the need for long term treatment and the importance of lifestyle modifications to improve her current health and to decrease her risk of future health problems.  AGREE: Multiple dietary modification options and treatment options were discussed and  Robi agreed to follow the recommendations documented in the above note.   ARRANGE: Murray was educated on the importance of frequent visits to treat obesity as outlined per CMS and USPSTF guidelines and agreed to schedule her next follow up appointment today.  Corey Skains, am acting as Location manager for [employer]. Owens Shark, DO  I have reviewed the above documentation for accuracy and completeness, and I agree with the above. -Jearld Lesch, DO

## 2018-11-10 NOTE — Progress Notes (Signed)
Virtual Visit via Video Note  I connected with [name] on 11/10/18 at 12:30 PM EST by a video enabled telemedicine application and verified that I am speaking with the correct person using two identifiers.   I discussed the limitations of evaluation and management by telemedicine and the availability of in person appointments. The patient expressed understanding and agreed to proceed.    I discussed the assessment and treatment plan with the patient. The patient was provided an opportunity to ask questions and all were answered. The patient agreed with the plan and demonstrated an understanding of the instructions.   The patient was advised to call back or seek an in-person evaluation if the symptoms worsen or if the condition fails to improve as anticipated.  I provided 32 minutes of non-face-to-face time during this encounter.   [name], [facility]    THERAPIST PROGRESS NOTE  Session Time: 12.30pm-12.32pm  Participation Level: Active  Behavioral Response: Well GroomedAlertaffect wnl  Type of Therapy: Individual Therapy  Treatment Goals addressed: Diagnosis: MDD, GAD and goal 1.  Interventions: CBT and Supportive  Summary: [name] is a 38 y.o. female who presents with affect wnl.  Pt reported she had her weight managment f/u today and feels good that able to adhere to plan 85% of time and has lost 4lbs in 2 weeks.  Pt also reports that her pain continues to be improved w/ her medication and she is able to feel up to getting things down and is accomplishing things.  Pt is able to feel good about this as well.  Pt reported on stressors of dad learning he has been having ministrokes and will likely have major stroke in future, daughter struggles w/ school and business w/ work.     Suicidal/Homicidal: Nowithout intent/plan  Therapist Response: Assessed pt current functioning per pt report.  Processed w/pt progress making and positive impact on mood. Discussed  stressors and how managing through.  Plan: Return again in 2 weeks, via webex.  F/u as scheduled w/ Dr. Doyne Keel.  Diagnosis: MDD, GAD   [name], [facility] 11/10/2018

## 2018-11-21 DIAGNOSIS — G4733 Obstructive sleep apnea (adult) (pediatric): Secondary | ICD-10-CM | POA: Diagnosis not present

## 2018-11-24 ENCOUNTER — Other Ambulatory Visit: Payer: Self-pay

## 2018-11-24 ENCOUNTER — Ambulatory Visit (INDEPENDENT_AMBULATORY_CARE_PROVIDER_SITE_OTHER): Payer: Federal, State, Local not specified - PPO | Admitting: Psychology

## 2018-11-24 DIAGNOSIS — F330 Major depressive disorder, recurrent, mild: Secondary | ICD-10-CM | POA: Diagnosis not present

## 2018-11-24 DIAGNOSIS — F411 Generalized anxiety disorder: Secondary | ICD-10-CM

## 2018-11-24 NOTE — Progress Notes (Signed)
Virtual Visit via Video Note  I connected with [name] on 11/24/18 at 12:30 PM EST by a video enabled telemedicine application and verified that I am speaking with the correct person using two identifiers.   I discussed the limitations of evaluation and management by telemedicine and the availability of in person appointments. The patient expressed understanding and agreed to proceed.     I discussed the assessment and treatment plan with the patient. The patient was provided an opportunity to ask questions and all were answered. The patient agreed with the plan and demonstrated an understanding of the instructions.   The patient was advised to call back or seek an in-person evaluation if the symptoms worsen or if the condition fails to improve as anticipated.  I provided 35 minutes of non-face-to-face time during this encounter.   [name] [facility]    THERAPIST PROGRESS NOTE  Session Time: 12.40pm-1.15pm  Participation Level: Active  Behavioral Response: Well GroomedAlertaffect wnl  Type of Therapy: Individual Therapy  Treatment Goals addressed: Diagnosis: MDD, GAD and goal 1.  Interventions: CBT and Supportive  Summary: [name] is a 38 y.o. female who presents with affect wnl.  Pt report she was late logging on as forgot about appointment.  Pt reported that shortly after her last visit, received news about the house they have been renting w/ intent to buy.  Lawyers are pushing her landlord to sell the house now so can start closing the estate, but pt not ready to purchase as still working on bring up credit and would need another 6 months. Pt states there is possibility that would work out if landlord can extend the estate process.  Pt discussed that she is concerned that won't be able to continue counseling at this time due to not being able to afford copay and doesn't qualify for any assistance.  Pt reports she will discuss w/ Dr. Doyne Keel as well and will  look at reducing counseling or taking a pause at this time.  Pt reported that the uncertain about housing is causing stress, but focused on what is in her control.  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 Suicidal/Homicidal: Nowithout intent/plan  Therapist Response: Assessed pt current functioning per pt report.  Processed w/pt new stressors and assisted pt in focus on coping skills.  Explored w/pt options for reducing frequency for counseling or need financially to take break.  Discussed financial assistance through [facility]- pt informed had looked into in past- but didn't qualify.   Plan: Return again in 2 weeks, via webex.  Pt informed she will notify if needs to cancel.  Pt to f/u as scheduled w/ Dr. Doyne Keel.    Diagnosis: MDD, GAD  [name], [facility] 11/24/2018

## 2018-11-27 ENCOUNTER — Encounter (HOSPITAL_COMMUNITY): Payer: Self-pay | Admitting: Psychiatry

## 2018-11-27 ENCOUNTER — Other Ambulatory Visit: Payer: Self-pay

## 2018-11-27 ENCOUNTER — Ambulatory Visit (INDEPENDENT_AMBULATORY_CARE_PROVIDER_SITE_OTHER): Payer: Federal, State, Local not specified - PPO | Admitting: Psychiatry

## 2018-11-27 DIAGNOSIS — F17210 Nicotine dependence, cigarettes, uncomplicated: Secondary | ICD-10-CM | POA: Diagnosis not present

## 2018-11-27 DIAGNOSIS — F332 Major depressive disorder, recurrent severe without psychotic features: Secondary | ICD-10-CM

## 2018-11-27 DIAGNOSIS — F411 Generalized anxiety disorder: Secondary | ICD-10-CM

## 2018-11-27 MED ORDER — BUSPIRONE HCL 10 MG PO TABS: 10.0000 mg | ORAL_TABLET | Freq: Three times a day (TID) | ORAL | 0 refills | Status: DC

## 2018-11-27 MED ORDER — FLUOXETINE HCL 20 MG PO CAPS: 60.0000 mg | ORAL_CAPSULE | Freq: Every day | ORAL | 0 refills | Status: DC

## 2018-11-27 NOTE — Progress Notes (Signed)
  Virtual Visit via Telephone Note  I connected with [name]  on 11/27/18 at 11:30 AM EST by telephone and verified that I am speaking with the correct person using two identifiers.  Location: Patient: home Provider: office   I discussed the limitations, risks, security and privacy concerns of performing an evaluation and management service by telephone and the availability of in person appointments. I also discussed with the patient that there may be a patient responsible charge related to this service. The patient expressed understanding and agreed to proceed.   History of Present Illness: "I am doing ok". She is seeing a therapist every 2 weeks but can't afford to continue this frequency. Her depression is stable. She is handling some big stressors well. The medications are working well and she is taking them as prescribed. She is working with the [employer] management clinic. They are taking a step wise fashion and so far it is achievable. [name] is motivated and interested. She feels optimistic and hopeful. [name] denies SI/HI. She remains uncomfortable about her weight. Her anxiety is mild and manageable. She is still smoking and did not contact the quitline. She does not feel ready to quit right now.    Observations/Objective: I spoke with [name] on the phone.  Pt was calm, pleasant and cooperative.  Pt was engaged in the conversation and answered questions appropriately.  Speech was clear and coherent with normal rate, tone and volume.  Mood is euthymic and affect is full. Thought processes are coherent, goal oriented and intact.  Thought content is logical.  Pt denies SI/HI.   Pt denies auditory and visual hallucinations and did not appear to be responding to internal stimuli.  Memory and concentration are good.  Fund of knowledge and use of language are average.  Insight and judgment are fair.  I am unable to comment on psychomotor activity, general appearance, hygiene,  or eye contact as I was unable to physically see the patient on the phone.  Vital signs not available since interview conducted virtually.    I reviewed the information below on 11/27/2018 and have updated it Assessment and Plan: MDD-recurrent, severe without psychotic features; GAD; Social anxiety disorder; Nicotine use do     Buspar 10mg  po TID for GAD and MDD   Prozac 60mg  po qD for MDD, GAD and social anxiety   Discussed smoking cessation in detail- I recommended she call 1-800-quitline when she is ready to quit. Wellbutrin was ineffective.   Status of current symptoms: stable    Follow Up Instructions: In 12 weeks or sooner if needed   I discussed the assessment and treatment plan with the patient. The patient was provided an opportunity to ask questions and all were answered. The patient agreed with the plan and demonstrated an understanding of the instructions.   The patient was advised to call back or seek an in-person evaluation if the symptoms worsen or if the condition fails to improve as anticipated.  I provided 20 minutes of non-face-to-face time during this encounter.   Charlcie Cradle, MD

## 2018-12-02 ENCOUNTER — Other Ambulatory Visit: Payer: Self-pay

## 2018-12-02 ENCOUNTER — Ambulatory Visit (INDEPENDENT_AMBULATORY_CARE_PROVIDER_SITE_OTHER): Payer: Federal, State, Local not specified - PPO | Admitting: Bariatrics

## 2018-12-02 ENCOUNTER — Encounter (INDEPENDENT_AMBULATORY_CARE_PROVIDER_SITE_OTHER): Payer: Self-pay | Admitting: Bariatrics

## 2018-12-02 VITALS — BP 120/67 | HR 85 | Temp 98.9°F | Ht 63.0 in | Wt 309.0 lb

## 2018-12-02 DIAGNOSIS — G4733 Obstructive sleep apnea (adult) (pediatric): Secondary | ICD-10-CM | POA: Diagnosis not present

## 2018-12-02 DIAGNOSIS — R7303 Prediabetes: Secondary | ICD-10-CM | POA: Diagnosis not present

## 2018-12-02 DIAGNOSIS — Z6841 Body Mass Index (BMI) 40.0 and over, adult: Secondary | ICD-10-CM

## 2018-12-02 DIAGNOSIS — E66813 Obesity, class 3: Secondary | ICD-10-CM

## 2018-12-02 NOTE — Progress Notes (Signed)
Office: [phone]  /  Fax: [phone]   HPI:   Chief Complaint: OBESITY [name] is here to discuss her progress with her obesity treatment plan. She is on the Category 3 plan and is following her eating plan approximately 70% of the time. She states she is exercising 0 minutes 0 times per week. [name] is down 2 lbs and doing well overall. She struggles with her budget. She is doing better with her water intake. Her weight is (!) 309 lb (140.2 kg) today and has had a weight loss of 2 pounds over a period of 3 weeks since her last visit. She has lost 6 lbs since starting treatment with [place].  Obstructive Sleep Apnea, on CPAP [name] has a diagnosis of obstructive sleep apnea. She reports wearing her CPAP nightly.  Pre-Diabetes [name] has a diagnosis of prediabetes based on her elevated Hgb A1c and was informed this puts her at greater risk of developing diabetes. Last A1c 6.1 on 09/29/2018 with an insulin of 15.4 on 10/27/2018. She is on no medications currently and continues to work on diet and exercise to decrease risk of diabetes. She denies nausea or hypoglycemia.  ASSESSMENT AND PLAN:  OSA (obstructive sleep apnea), CPAP  Prediabetes  Class 3 severe obesity with serious comorbidity and body mass index (BMI) of 50.0 to 59.9 in adult, unspecified obesity type ([place])  PLAN:  Obstructive Sleep Apnea, on CPAP [name] will continue to wear her CPAP nightly.  Pre-Diabetes [name] will continue to work on weight loss, exercise, and decreasing simple carbohydrates in her diet to help decrease the risk of diabetes. We dicussed metformin including benefits and risks. She was informed that eating too many simple carbohydrates or too many calories at one sitting increases the likelihood of GI side effects. Zo will decrease carbohydrates, increase protein and healthy fats.  I spent > than 50% of the 20 minute visit on counseling as documented in the note.  Obesity [name] is  currently in the action stage of change. As such, her goal is to continue with weight loss efforts. She has agreed to follow the Category 3 plan. [name] will work on meal planning and increasing her water intake. She was given Thanksgiving handout and handout on "Eating Out." [name] has been instructed to increase activities for weight loss and overall health benefits. We discussed the following Behavioral Modification Strategies today: increasing lean protein intake, decreasing simple carbohydrates, increasing vegetables, increase H20 intake, decrease eating out, no skipping meals, work on meal planning and easy cooking plans, and keeping healthy foods in the home.  [name] has agreed to follow-up with our clinic in 2-3 weeks. She was informed of the importance of frequent follow-up visits to maximize her success with intensive lifestyle modifications for her multiple health conditions.  ALLERGIES: Allergies  Allergen Reactions  . Codeine     STOMACH CRAMPING  . Doxycycline Nausea And Vomiting    MEDICATIONS: Current Outpatient Medications on File Prior to Visit  Medication Sig Dispense Refill  . busPIRone (BUSPAR) 10 MG tablet Take 1 tablet (10 mg total) by mouth 3 (three) times daily. For mood control 270 tablet 0  . celecoxib (CELEBREX) 200 MG capsule Take 200 mg by mouth daily.    Marland Kitchen FLUoxetine (PROZAC) 20 MG capsule Take 3 capsules (60 mg total) by mouth daily. Mood control 270 capsule 0  . Vitamin D, Ergocalciferol, (DRISDOL) 1.25 MG (50000 UT) CAPS capsule Take 1 capsule (50,000 Units total) by mouth every 7 (seven) days for 12  doses. 12 capsule 0   No current facility-administered medications on file prior to visit.     PAST MEDICAL HISTORY: Past Medical History:  Diagnosis Date  . Anxiety   . Back pain   . Bilateral swelling of feet   . Chest pain   . Cluster headaches   . Depression   . Eczema   . GERD (gastroesophageal reflux disease)   . Gestational diabetes 2012,  2016  . Hyperlipidemia   . Hypertension    no meds currently  . Joint pain   . Osteoarthritis   . PCOS (polycystic ovarian syndrome)   . Pre-diabetes   . Sciatica   . Shortness of breath   . Sleep apnea   . Snoring    sleep study 02/2012 with min AHI events  . Vitamin D deficiency     PAST SURGICAL HISTORY: Past Surgical History:  Procedure Laterality Date  . CESAREAN SECTION  09 & 12   x's 2  . CESAREAN SECTION WITH BILATERAL TUBAL LIGATION Bilateral 12/18/2014   Procedure: REPEAT CESAREAN SECTION WITH BILATERAL TUBAL LIGATION;  Surgeon: Crawford Givens, MD;  Location: [facility];  Service: Obstetrics;  Laterality: Bilateral;  . COLPOSCOPY    . DENTAL SURGERY    . Ganglion removal from (L) wrist  1998  . LAPAROSCOPIC UNILATERAL SALPINGECTOMY Left 01/26/2015   Procedure: LAPAROSCOPIC UNILATERAL SALPINGECTOMY;  Surgeon: Crawford Givens, MD;  Location: [facility];  Service: Gynecology;  Laterality: Left;  . WISDOM TOOTH EXTRACTION      SOCIAL HISTORY: Social History   Tobacco Use  . Smoking status: Current Every Day Smoker    Packs/day: 0.25    Years: 15.00    Pack years: 3.75    Types: Cigarettes  . Smokeless tobacco: Never Used  Substance Use Topics  . Alcohol use: Yes    Comment: once a month or less  . Drug use: No    FAMILY HISTORY: Family History  Problem Relation Age of Onset  . Arthritis Mother   . Alcohol abuse Mother   . Mental illness Mother   . Cancer Mother 72       squamous - unknown primary  . Coronary artery disease Mother 1       MI with stent  . Anxiety disorder Mother   . Depression Mother   . Diabetes Mother   . Obesity Mother   . Arthritis Father   . Alcohol abuse Father   . Hyperlipidemia Father   . Heart disease Father   . Kidney disease Father   . Stroke Father   . Mental illness Father   . Drug abuse Father   . Hypertension Father   . Depression Father   . Alcoholism Father   . Diabetes Maternal Grandmother   . Heart disease Maternal  Grandmother   . Stroke Maternal Grandfather   . Hypertension Paternal Grandfather   . Hyperlipidemia Paternal Grandfather   . Arthritis Other   . Alcohol abuse Other   . Breast cancer Paternal Aunt   . Anxiety disorder Sister    ROS: Review of Systems  Respiratory:       Positive for obstructive sleep apnea.  Gastrointestinal: Negative for nausea.  Endo/Heme/Allergies:       Negative for hypoglycemia.   PHYSICAL EXAM: Blood pressure 120/67, pulse 85, temperature 98.9 F (37.2 C), height 5\' 3"  (1.6 m), weight (!) 309 lb (140.2 kg), last menstrual period 11/13/2018, SpO2 98 %, unknown if currently breastfeeding. Body mass index is  54.74 kg/m. Physical Exam Vitals signs reviewed.  Constitutional:      Appearance: Normal appearance. She is obese.  Cardiovascular:     Rate and Rhythm: Normal rate.     Pulses: Normal pulses.  Pulmonary:     Effort: Pulmonary effort is normal.     Breath sounds: Normal breath sounds.  Musculoskeletal: Normal range of motion.  Skin:    General: Skin is warm and dry.  Neurological:     Mental Status: She is alert and oriented to person, place, and time.  Psychiatric:        Behavior: Behavior normal.   RECENT LABS AND TESTS: BMET    Component Value Date/Time   NA 138 09/29/2018 1025   K 4.0 09/29/2018 1025   CL 103 09/29/2018 1025   CO2 29 09/29/2018 1025   GLUCOSE 98 09/29/2018 1025   BUN 11 09/29/2018 1025   CREATININE 0.91 09/29/2018 1025   CALCIUM 9.3 09/29/2018 1025   GFRNONAA >60 09/15/2017 1612   GFRAA >60 09/15/2017 1612   Lab Results  Component Value Date   HGBA1C 6.1 09/29/2018   HGBA1C 5.9 08/14/2017   HGBA1C 5.8 03/13/2016   HGBA1C 5.5 05/16/2015   HGBA1C 6.1 01/07/2014   Lab Results  Component Value Date   INSULIN 15.4 10/27/2018   CBC    Component Value Date/Time   WBC 6.7 09/29/2018 1025   RBC 4.33 09/29/2018 1025   HGB 12.6 09/29/2018 1025   HCT 37.8 09/29/2018 1025   PLT 292.0 09/29/2018 1025   MCV  87.3 09/29/2018 1025   MCH 29.3 09/15/2017 1612   MCHC 33.3 09/29/2018 1025   RDW 14.0 09/29/2018 1025   LYMPHSABS 1.5 09/29/2018 1025   MONOABS 0.3 09/29/2018 1025   EOSABS 0.1 09/29/2018 1025   BASOSABS 0.0 09/29/2018 1025   Iron/TIBC/Ferritin/ %Sat No results found for: IRON, TIBC, FERRITIN, IRONPCTSAT Lipid Panel     Component Value Date/Time   CHOL 192 08/14/2017 0851   TRIG 150.0 (H) 08/14/2017 0851   TRIG 104 03/13/2016 0958   HDL 33.90 (L) 08/14/2017 0851   HDL 52 03/13/2016 0958   CHOLHDL 6 08/14/2017 0851   VLDL 30.0 08/14/2017 0851   LDLCALC 128 (H) 08/14/2017 0851   LDLDIRECT 111.5 09/09/2012 0739   Hepatic Function Panel     Component Value Date/Time   PROT 6.9 09/29/2018 1025   ALBUMIN 4.0 09/29/2018 1025   AST 13 09/29/2018 1025   ALT 18 09/29/2018 1025   ALKPHOS 91 09/29/2018 1025   BILITOT 0.4 09/29/2018 1025   BILIDIR 0.0 09/09/2012 0739      Component Value Date/Time   TSH 3.37 09/29/2018 1025   TSH 2.51 08/14/2017 0851   TSH 1.34 11/03/2015 1536   Results for MARYALYCE, MANIRE (MRN [id number]) as of 12/02/2018 13:37  Ref. Range 09/29/2018 10:25  VITD Latest Ref Range: 30.00 - 100.00 ng/mL 20.59 (L)   OBESITY BEHAVIORAL INTERVENTION VISIT  Today's visit was #3   Starting weight: 315 lbs Starting date: 10/27/2018 Today's weight: 309 lbs  Today's date: 12/02/2018 Total lbs lost to date: 6     12/02/2018  Height 5\' 3"  (1.6 m)  Weight 309 lb (140.2 kg) (A)  BMI (Calculated) 54.75  BLOOD PRESSURE - SYSTOLIC [id number]  BLOOD PRESSURE - DIASTOLIC 67   Body Fat % [id number] %  Total Body Water (lbs) 100.4 lbs   ASK: We discussed the diagnosis of obesity with Amery R Severe today and Jaila agreed to give  [place] permission to discuss obesity behavioral modification therapy today.  ASSESS: Terena has the diagnosis of obesity and her BMI today is 54.8. Jameia is in the action stage of change.   ADVISE: Milan was educated on the multiple health  risks of obesity as well as the benefit of weight loss to improve her health. She was advised of the need for long term treatment and the importance of lifestyle modifications to improve her current health and to decrease her risk of future health problems.  AGREE: Multiple dietary modification options and treatment options were discussed and  Simmie agreed to follow the recommendations documented in the above note.  ARRANGE: Shaquitta was educated on the importance of frequent visits to treat obesity as outlined per CMS and USPSTF guidelines and agreed to schedule her next follow up appointment today.  Migdalia Dk, am acting as Location manager for [employer], DO  I have reviewed the above documentation for accuracy and completeness, and I agree with the above. -Jearld Lesch, DO

## 2018-12-08 ENCOUNTER — Other Ambulatory Visit: Payer: Self-pay

## 2018-12-08 ENCOUNTER — Encounter (HOSPITAL_COMMUNITY): Payer: Self-pay | Admitting: Psychology

## 2018-12-08 ENCOUNTER — Ambulatory Visit (HOSPITAL_COMMUNITY): Payer: Federal, State, Local not specified - PPO | Admitting: Psychology

## 2018-12-08 NOTE — Progress Notes (Signed)
[name] is a 38 y.o. female patient who cancelled her virtual appointment today.  Pt logged into appointment via webex and informed she is working out of the office today and doesn't have privacy to continue appointment.  Pt is scheduled for f/u on 12/29/18.          [name], [facility]

## 2018-12-24 ENCOUNTER — Telehealth (INDEPENDENT_AMBULATORY_CARE_PROVIDER_SITE_OTHER): Payer: Federal, State, Local not specified - PPO | Admitting: Bariatrics

## 2018-12-24 ENCOUNTER — Other Ambulatory Visit: Payer: Self-pay

## 2018-12-24 ENCOUNTER — Encounter (INDEPENDENT_AMBULATORY_CARE_PROVIDER_SITE_OTHER): Payer: Self-pay | Admitting: Bariatrics

## 2018-12-24 ENCOUNTER — Other Ambulatory Visit: Payer: Self-pay | Admitting: Family

## 2018-12-24 DIAGNOSIS — E559 Vitamin D deficiency, unspecified: Secondary | ICD-10-CM | POA: Diagnosis not present

## 2018-12-24 DIAGNOSIS — E7849 Other hyperlipidemia: Secondary | ICD-10-CM | POA: Diagnosis not present

## 2018-12-24 DIAGNOSIS — Z6841 Body Mass Index (BMI) 40.0 and over, adult: Secondary | ICD-10-CM

## 2018-12-24 MED ORDER — VITAMIN D (ERGOCALCIFEROL) 1.25 MG (50000 UNIT) PO CAPS: 50000.0000 [IU] | ORAL_CAPSULE | ORAL | 0 refills | Status: DC

## 2018-12-25 NOTE — Progress Notes (Signed)
Office: [phone]  /  Fax: [phone] TeleHealth Visit:  [name] has verbally consented to this TeleHealth visit today. The patient is located at home, the provider is located at the [facility] office. The participants in this visit include the listed provider and patient. The visit was conducted today via Webex.  HPI:  Chief Complaint: OBESITY [name] is here to discuss her progress with her obesity treatment plan. She is on the Category 3 plan and states she is following her eating plan approximately 80% of the time. She states she is exercising 0 minutes 0 times per week.  [name] states that her weight remains the same. She has been "trying to do more with the diet." She reports eating adequate protein.  Today's visit was #4 Starting weight: 315 lbs Starting date: 10/27/2018  Vitamin D deficiency [name] has a diagnosis of Vitamin D deficiency and is on prescription Vitamin D. No nausea, vomiting, or muscle weakness. She reports minimal sunlight exposure.  Other Hyperlipidemia [name] has hyperlipidemia and is on no medications. No abdominal pain.  ASSESSMENT AND PLAN:  Vitamin D deficiency  Other hyperlipidemia  Class 3 severe obesity with serious comorbidity and body mass index (BMI) of 50.0 to 59.9 in adult, unspecified obesity type ([place])  PLAN:  Vitamin D Deficiency [name] was informed that low Vitamin D levels contributes to fatigue and are associated with obesity, breast, and colon cancer. She agrees to continue to take prescription Vit D @ 50,000 IU every week #5 with 0 refills and will follow-up for routine testing of Vitamin D, at least 2-3 times per year. She was informed of the risk of over-replacement of Vitamin D and agrees to not increase her dose unless she discusses this with [place] first. [name] agrees to follow-up with our clinic in 3 weeks.  Other Hyperlipidemia Intensive lifestyle modifications as the first line treatment for  hyperlipidemia. We discussed many lifestyle modifications today and [name] will decrease trans and saturated fats, increase PUFA's and MUFA's. She will continue to work on diet, exercise and weight loss efforts.  Obesity [name] is currently in the action stage of change. As such, her goal is to continue with weight loss efforts. She has agreed to follow the Category 3 plan. [name] will work on meal planning, intentional eating, and increasing her water intake. [name] has been instructed to add in walking for weight loss and overall health benefits. We discussed the following Behavioral Modification Strategies today: increasing lean protein intake, decreasing simple carbohydrates, increasing vegetables, increase H20 intake, decrease eating out, no skipping meals, work on meal planning and easy cooking plans, keeping healthy foods in the home, and planning for success.  [name] has agreed to follow-up with our clinic in 3 weeks. She was informed of the importance of frequent follow-up visits to maximize her success with intensive lifestyle modifications for her multiple health conditions.  ALLERGIES: Allergies  Allergen Reactions  . Codeine     STOMACH CRAMPING  . Doxycycline Nausea And Vomiting    MEDICATIONS: Current Outpatient Medications on File Prior to Visit  Medication Sig Dispense Refill  . busPIRone (BUSPAR) 10 MG tablet Take 1 tablet (10 mg total) by mouth 3 (three) times daily. For mood control 270 tablet 0  . celecoxib (CELEBREX) 200 MG capsule Take 200 mg by mouth daily.    Marland Kitchen FLUoxetine (PROZAC) 20 MG capsule Take 3 capsules (60 mg total) by mouth daily. Mood control 270 capsule 0   No current facility-administered medications  on file prior to visit.    PAST MEDICAL HISTORY: Past Medical History:  Diagnosis Date  . Anxiety   . Back pain   . Bilateral swelling of feet   . Chest pain   . Cluster headaches   . Depression   . Eczema   . GERD (gastroesophageal reflux  disease)   . Gestational diabetes 2012, 2016  . Hyperlipidemia   . Hypertension    no meds currently  . Joint pain   . Osteoarthritis   . PCOS (polycystic ovarian syndrome)   . Pre-diabetes   . Sciatica   . Shortness of breath   . Sleep apnea   . Snoring    sleep study 02/2012 with min AHI events  . Vitamin D deficiency     PAST SURGICAL HISTORY: Past Surgical History:  Procedure Laterality Date  . CESAREAN SECTION  09 & 12   x's 2  . CESAREAN SECTION WITH BILATERAL TUBAL LIGATION Bilateral 12/18/2014   Procedure: REPEAT CESAREAN SECTION WITH BILATERAL TUBAL LIGATION;  Surgeon: Crawford Givens, MD;  Location: [facility];  Service: Obstetrics;  Laterality: Bilateral;  . COLPOSCOPY    . DENTAL SURGERY    . Ganglion removal from (L) wrist  1998  . LAPAROSCOPIC UNILATERAL SALPINGECTOMY Left 01/26/2015   Procedure: LAPAROSCOPIC UNILATERAL SALPINGECTOMY;  Surgeon: Crawford Givens, MD;  Location: [facility];  Service: Gynecology;  Laterality: Left;  . WISDOM TOOTH EXTRACTION      SOCIAL HISTORY: Social History   Tobacco Use  . Smoking status: Current Every Day Smoker    Packs/day: 0.25    Years: 15.00    Pack years: 3.75    Types: Cigarettes  . Smokeless tobacco: Never Used  Substance Use Topics  . Alcohol use: Yes    Comment: once a month or less  . Drug use: No    FAMILY HISTORY: Family History  Problem Relation Age of Onset  . Arthritis Mother   . Alcohol abuse Mother   . Mental illness Mother   . Cancer Mother 17       squamous - unknown primary  . Coronary artery disease Mother 30       MI with stent  . Anxiety disorder Mother   . Depression Mother   . Diabetes Mother   . Obesity Mother   . Arthritis Father   . Alcohol abuse Father   . Hyperlipidemia Father   . Heart disease Father   . Kidney disease Father   . Stroke Father   . Mental illness Father   . Drug abuse Father   . Hypertension Father   . Depression Father   . Alcoholism Father   . Diabetes Maternal  Grandmother   . Heart disease Maternal Grandmother   . Stroke Maternal Grandfather   . Hypertension Paternal Grandfather   . Hyperlipidemia Paternal Grandfather   . Arthritis Other   . Alcohol abuse Other   . Breast cancer Paternal Aunt   . Anxiety disorder Sister    ROS: Review of Systems  Gastrointestinal: Negative for abdominal pain, nausea and vomiting.  Musculoskeletal:       Negative for muscle weakness.   PHYSICAL EXAM: unknown if currently breastfeeding. There is no height or weight on file to calculate BMI. Physical Exam: Pt in no acute distress.  RECENT LABS AND TESTS: BMET    Component Value Date/Time   NA 138 09/29/2018 1025   K 4.0 09/29/2018 1025   CL 103 09/29/2018 1025   CO2  29 09/29/2018 1025   GLUCOSE 98 09/29/2018 1025   BUN 11 09/29/2018 1025   CREATININE 0.91 09/29/2018 1025   CALCIUM 9.3 09/29/2018 1025   GFRNONAA >60 09/15/2017 1612   GFRAA >60 09/15/2017 1612   Lab Results  Component Value Date   HGBA1C 6.1 09/29/2018   HGBA1C 5.9 08/14/2017   HGBA1C 5.8 03/13/2016   HGBA1C 5.5 05/16/2015   HGBA1C 6.1 01/07/2014   Lab Results  Component Value Date   INSULIN 15.4 10/27/2018   CBC    Component Value Date/Time   WBC 6.7 09/29/2018 1025   RBC 4.33 09/29/2018 1025   HGB 12.6 09/29/2018 1025   HCT 37.8 09/29/2018 1025   PLT 292.0 09/29/2018 1025   MCV 87.3 09/29/2018 1025   MCH 29.3 09/15/2017 1612   MCHC 33.3 09/29/2018 1025   RDW 14.0 09/29/2018 1025   LYMPHSABS 1.5 09/29/2018 1025   MONOABS 0.3 09/29/2018 1025   EOSABS 0.1 09/29/2018 1025   BASOSABS 0.0 09/29/2018 1025   Iron/TIBC/Ferritin/ %Sat No results found for: IRON, TIBC, FERRITIN, IRONPCTSAT Lipid Panel     Component Value Date/Time   CHOL 192 08/14/2017 0851   TRIG 150.0 (H) 08/14/2017 0851   TRIG 104 03/13/2016 0958   HDL 33.90 (L) 08/14/2017 0851   HDL 52 03/13/2016 0958   CHOLHDL 6 08/14/2017 0851   VLDL 30.0 08/14/2017 0851   LDLCALC 128 (H) 08/14/2017 0851    LDLDIRECT 111.5 09/09/2012 0739   Hepatic Function Panel     Component Value Date/Time   PROT 6.9 09/29/2018 1025   ALBUMIN 4.0 09/29/2018 1025   AST 13 09/29/2018 1025   ALT 18 09/29/2018 1025   ALKPHOS 91 09/29/2018 1025   BILITOT 0.4 09/29/2018 1025   BILIDIR 0.0 09/09/2012 0739      Component Value Date/Time   TSH 3.37 09/29/2018 1025   TSH 2.51 08/14/2017 0851   TSH 1.34 11/03/2015 1536    OBESITY BEHAVIORAL INTERVENTION VISIT DOCUMENTATION FOR INSURANCE (~15 minutes)  I, Michaelene Song, am acting as Location manager for [employer], DO  I have reviewed the above documentation for accuracy and completeness, and I agree with the above. Jearld Lesch, DO

## 2018-12-29 ENCOUNTER — Ambulatory Visit (HOSPITAL_COMMUNITY): Payer: Federal, State, Local not specified - PPO | Admitting: Psychology

## 2018-12-29 ENCOUNTER — Other Ambulatory Visit: Payer: Self-pay

## 2018-12-29 ENCOUNTER — Encounter (HOSPITAL_COMMUNITY): Payer: Self-pay | Admitting: Psychology

## 2018-12-29 NOTE — Progress Notes (Signed)
[name] is a 38 y.o. female patient who didn't show for her virtual appointment.  Pt was notified by email and informed of next appointment already scheduled. Marland Kitchen        [name], [facility]

## 2019-01-14 ENCOUNTER — Encounter (INDEPENDENT_AMBULATORY_CARE_PROVIDER_SITE_OTHER): Payer: Self-pay | Admitting: Bariatrics

## 2019-01-14 ENCOUNTER — Telehealth (INDEPENDENT_AMBULATORY_CARE_PROVIDER_SITE_OTHER): Payer: Federal, State, Local not specified - PPO | Admitting: Bariatrics

## 2019-01-14 ENCOUNTER — Other Ambulatory Visit: Payer: Self-pay

## 2019-01-14 DIAGNOSIS — Z6841 Body Mass Index (BMI) 40.0 and over, adult: Secondary | ICD-10-CM | POA: Diagnosis not present

## 2019-01-14 DIAGNOSIS — E559 Vitamin D deficiency, unspecified: Secondary | ICD-10-CM | POA: Diagnosis not present

## 2019-01-14 DIAGNOSIS — R7303 Prediabetes: Secondary | ICD-10-CM | POA: Diagnosis not present

## 2019-01-19 ENCOUNTER — Ambulatory Visit (INDEPENDENT_AMBULATORY_CARE_PROVIDER_SITE_OTHER): Payer: Federal, State, Local not specified - PPO | Admitting: Psychology

## 2019-01-19 ENCOUNTER — Other Ambulatory Visit: Payer: Self-pay

## 2019-01-19 DIAGNOSIS — F411 Generalized anxiety disorder: Secondary | ICD-10-CM | POA: Diagnosis not present

## 2019-01-19 DIAGNOSIS — F330 Major depressive disorder, recurrent, mild: Secondary | ICD-10-CM

## 2019-01-19 NOTE — Progress Notes (Signed)
Virtual Visit via Video Note  I connected with [name] on 01/19/19 at 12:30 PM EST by a video enabled telemedicine application and verified that I am speaking with the correct person using two identifiers.   I discussed the limitations of evaluation and management by telemedicine and the availability of in person appointments. The patient expressed understanding and agreed to proceed.  History of Present Illness:    Observations/Objective:   Assessment and Plan:   Follow Up Instructions:    I discussed the assessment and treatment plan with the patient. The patient was provided an opportunity to ask questions and all were answered. The patient agreed with the plan and demonstrated an understanding of the instructions.   The patient was advised to call back or seek an in-person evaluation if the symptoms worsen or if the condition fails to improve as anticipated.  I provided 36 minutes of non-face-to-face time during this encounter.   [name] [facility]    THERAPIST PROGRESS NOTE  Session Time: 12.36pm-1.12pm  Participation Level: Active  Behavioral Response: Well GroomedAlertaffect wnl  Type of Therapy: Individual Therapy  Treatment Goals addressed: Diagnosis: GAd, MDD and goal 1.  Interventions: CBT and Strength-based  Summary: [name] is a 39 y.o. female who presents with affect wnl.  Pt distraction of son present.  Pt reported that had parent part of his assessment today- so that is moving forward.  Pt reported that she had some stressors last month- w/ car needing work and expense associated.  Pt reported that good new is that just received stimulus check so can get the work done and also good news that able to stay in house leasing and closing of landlord's father's estate will not impact.  Pt reported that they did have a good Christmas and she enjoyed time off.  Pt reports she is still getting things done around the house and feels good about  progress there.  Pt reports no motivation to begin walking as recommended by weightloss support.  Pt discussed potential of beginning w/ change to increased movement in home.  Pt acknowledges w/ son present not able to get fully into emotions and thoughts today- make efforts to have that space next time.  Suicidal/Homicidal: Nowithout intent/plan  Therapist Response: Assessed pt current functioning per pt report. Processed w/pt coping w/ stressors as well as positives that have occurred.  Explored w/pt continued steps making towards goals and discussed ways of making next steps towards others.  Plan: Return again in 3-4 weeks, via webex.  F/u as scheduled w/ Dr. Doyne Keel.  Diagnosis: GAD, MDD  [name], [facility] 01/19/2019

## 2019-01-21 NOTE — Progress Notes (Signed)
TeleHealth Visit:  Due to the COVID-19 pandemic, this visit was completed with telemedicine (audio/video) technology to reduce patient and provider exposure as well as to preserve personal protective equipment.   [name] has verbally consented to this TeleHealth visit. The patient is located at home, the provider is located at the [employer] [facility] office. The participants in this visit include the listed provider and patient. The visit was conducted today via Webex (20 minutes ).  Chief Complaint: OBESITY [name] is here to discuss her progress with her obesity treatment plan along with follow-up of her obesity related diagnoses. [name] is on the Category 3 Plan and states she is following her eating plan approximately 90% of the time. [name] states she is exercising 0 minutes 0 times per week.  Today's visit was #: 5 Starting weight: 315 lbs Starting date: 10/27/2018  Interim History: [name] states that her weight remains the same. She states that she has been under more stress and struggled over the holidays. She is doing okay with protein.  Subjective:   Prediabetes. [name] has a diagnosis of prediabetes based on her elevated HgA1c and was informed this puts her at greater risk of developing diabetes. She continues to work on diet and exercise to decrease her risk of diabetes. She denies nausea or hypoglycemia. She reports a normal appetite. Last A1c 6.1 on 09/29/2018 with an insulin of 15.4 on 10/27/2018.  Vitamin D deficiency. No nausea, vomiting, or muscle weakness. Last Vitamin D level 20.59 on 09/29/2018.  Assessment/Plan:   Prediabetes. [name] will continue to work on weight loss, exercise, increasing protein and healthy fats, and decreasing simple carbohydrates to help decrease the risk of diabetes.   Vitamin D deficiency. Low Vitamin D level contributes to fatigue and are associated with obesity, breast, and colon cancer. She agrees to  continue taking Vitamin D and will follow-up for routine testing of vitamin D, at least 2-3 times per year to avoid over-replacement.  Class 3 severe obesity due to excess calories with serious comorbidity and body mass index (BMI) of 50.0 to 59.9 in adult [facility]).  [name] is currently in the action stage of change. As such, her goal is to continue with weight loss efforts. She has agreed to on the Category 3 Plan.   She will work on meal planning, intentional eating, and increasing her water intake.  We discussed the following exercise goals today: [name] will start doing afternoon walks.  We discussed the following behavioral modification strategies today: increasing lean protein intake, decreasing simple carbohydrates, increasing vegetables, increasing water intake, decreasing eating out, no skipping meals, meal planning and cooking strategies, keeping healthy foods in the home and planning for success.  [name] has agreed to follow-up with our clinic in 2-3 weeks. She was informed of the importance of frequent follow-up visits to maximize her success with intensive lifestyle modifications for her multiple health conditions.  Objective:   VITALS: Per patient if applicable, see vitals. GENERAL: Alert and in no acute distress. CARDIOPULMONARY: No increased WOB. Speaking in clear sentences.  PSYCH: Pleasant and cooperative. Speech normal rate and rhythm. Affect is appropriate. Insight and judgement are appropriate. Attention is focused, linear, and appropriate.  NEURO: Oriented as arrived to appointment on time with no prompting.   Lab Results  Component Value Date   CREATININE 0.91 09/29/2018   BUN 11 09/29/2018   NA 138 09/29/2018   K 4.0 09/29/2018   CL 103 09/29/2018  CO2 29 09/29/2018   Lab Results  Component Value Date   ALT 18 09/29/2018   AST 13 09/29/2018   ALKPHOS 91 09/29/2018   BILITOT 0.4 09/29/2018   Lab Results  Component Value Date   HGBA1C 6.1  09/29/2018   HGBA1C 5.9 08/14/2017   HGBA1C 5.8 03/13/2016   HGBA1C 5.5 05/16/2015   HGBA1C 6.1 01/07/2014   Lab Results  Component Value Date   INSULIN 15.4 10/27/2018   Lab Results  Component Value Date   TSH 3.37 09/29/2018   Lab Results  Component Value Date   CHOL 192 08/14/2017   HDL 33.90 (L) 08/14/2017   LDLCALC 128 (H) 08/14/2017   LDLDIRECT 111.5 09/09/2012   TRIG 150.0 (H) 08/14/2017   CHOLHDL 6 08/14/2017   Lab Results  Component Value Date   WBC 6.7 09/29/2018   HGB 12.6 09/29/2018   HCT 37.8 09/29/2018   MCV 87.3 09/29/2018   PLT 292.0 09/29/2018   No results found for: IRON, TIBC, FERRITIN  Attestation Statements:   Reviewed by clinician on day of visit: allergies, medications, problem list, medical history, surgical history, family history, social history, and previous encounter notes.  Migdalia Dk, am acting as Location manager for [employer], DO   I have reviewed the above documentation for accuracy and completeness, and I agree with the above. Jearld Lesch, DO

## 2019-01-29 ENCOUNTER — Ambulatory Visit (INDEPENDENT_AMBULATORY_CARE_PROVIDER_SITE_OTHER): Payer: Federal, State, Local not specified - PPO | Admitting: Bariatrics

## 2019-02-11 ENCOUNTER — Telehealth (INDEPENDENT_AMBULATORY_CARE_PROVIDER_SITE_OTHER): Payer: Federal, State, Local not specified - PPO | Admitting: Adult Health

## 2019-02-11 ENCOUNTER — Encounter: Payer: Self-pay | Admitting: Adult Health

## 2019-02-11 DIAGNOSIS — G4733 Obstructive sleep apnea (adult) (pediatric): Secondary | ICD-10-CM

## 2019-02-11 DIAGNOSIS — Z9989 Dependence on other enabling machines and devices: Secondary | ICD-10-CM | POA: Diagnosis not present

## 2019-02-11 NOTE — Patient Instructions (Signed)
Continue on CPAP At bedtime  .  Keep up the good work Work on [employer]  Do not drive if sleepy  Follow up with Dr. Elsworth Soho  In 1 year and As needed

## 2019-02-11 NOTE — Progress Notes (Signed)
Virtual Visit via Video Note  I connected with [name] on 02/11/19 at  9:00 AM EST by a video enabled telemedicine application and verified that I am speaking with the correct person using two identifiers.  Location: Patient: Home Provider: Office   I discussed the limitations of evaluation and management by telemedicine and the availability of in person appointments. The patient expressed understanding and agreed to proceed.  History of Present Illness: 39 year old female followed for obstructive sleep apnea on nocturnal CPAP  Today's video visit is a 1 year follow-up for sleep apnea.  Patient says she is doing very well on CPAP at bedtime.  Says she wears it every night and does not go without it.  She says she is feeling very rested and feels that she benefits from CPAP.  CPAP download shows excellent compliance with daily average usage at 9.5 hours.  Patient is on auto CPAP 7 to 12 cm H2O.  AHI 1.9. We discussed CPAP cleaning.  Discussed healthy weight loss.  Patient Active Problem List   Diagnosis Date Noted  . Low back pain 07/04/2018  . 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 Neck pain 08/14/2017  . Pre-diabetes 08/14/2017  . Nicotine dependence, cigarettes, uncomplicated [id number]  . Fatigue 11/03/2015  . Allergic rhinitis 05/16/2015  . S/P C-section 12/18/2014  . Hyperlipidemia 12/16/2014  . Hx of herpes simplex infection 11/29/2014  . Major depressive disorder, recurrent, severe without psychotic features ([place]) 09/23/2014  . GAD (generalized anxiety disorder) 09/23/2014  . Social anxiety disorder 09/23/2014  . Routine general medical examination at a health care facility 01/07/2014  . OSA on CPAP 11/26/2011  . Morbid obesity ([place]) 05/17/2011  . Patellofemoral syndrome of right knee 02/16/2011  . Depression 02/16/2011  . Hx gestational diabetes 02/16/2011  . Eczema  02/16/2011   Current Outpatient Medications on File Prior to Visit  Medication Sig Dispense Refill  . busPIRone (BUSPAR) 10 MG tablet Take 1 tablet (10 mg total) by mouth 3 (three) times daily. For mood control 270 tablet 0  . calcium-vitamin D (OSCAL WITH D) 500-200 MG-UNIT tablet Take 1 tablet by mouth. Patient taking 5000 units per day    . celecoxib (CELEBREX) 200 MG capsule Take 200 mg by mouth 2 (two) times daily.     . Cholecalciferol (VITAMIN D3) 1.25 MG (50000 UT) CAPS Take by mouth.    Marland Kitchen FLUoxetine (PROZAC) 20 MG capsule Take 3 capsules (60 mg total) by mouth daily. Mood control 270 capsule 0  . Multiple Vitamin (MULTIVITAMIN) capsule Take 1 capsule by mouth daily.    . vitamin B-12 (CYANOCOBALAMIN) 500 MCG tablet Take 1,000 mcg by mouth daily. Patient reports taking 5000 mcg per day     No current facility-administered medications on file prior to visit.      Observations/Objective: Appears well with no apparent distress  Assessment and Plan: Obstructive sleep apnea excellent compliance and control on nocturnal CPAP  Obesity-encouraged on healthy weight loss  Plan  Patient Instructions  Continue on CPAP At bedtime  .  Keep up the good work Work on [employer]  Do not drive if sleepy  Follow up with Dr. Elsworth Soho  In 1 year and As needed        Follow Up Instructions: Follow-up in 1 year and as needed   I discussed the assessment and treatment plan with the patient. The patient was provided an opportunity to ask questions and  all were answered. The patient agreed with the plan and demonstrated an understanding of the instructions.   The patient was advised to call back or seek an in-person evaluation if the symptoms worsen or if the condition fails to improve as anticipated.  I provided 21 minutes of non-face-to-face time during this encounter.   Rexene Edison, NP

## 2019-02-12 ENCOUNTER — Other Ambulatory Visit: Payer: Self-pay

## 2019-02-12 ENCOUNTER — Ambulatory Visit (INDEPENDENT_AMBULATORY_CARE_PROVIDER_SITE_OTHER): Payer: Federal, State, Local not specified - PPO | Admitting: Bariatrics

## 2019-02-12 VITALS — BP 139/80 | HR 80 | Temp 98.6°F | Ht 63.0 in | Wt 309.0 lb

## 2019-02-12 DIAGNOSIS — Z6841 Body Mass Index (BMI) 40.0 and over, adult: Secondary | ICD-10-CM

## 2019-02-12 DIAGNOSIS — E7849 Other hyperlipidemia: Secondary | ICD-10-CM

## 2019-02-12 DIAGNOSIS — R7303 Prediabetes: Secondary | ICD-10-CM | POA: Diagnosis not present

## 2019-02-16 ENCOUNTER — Encounter (INDEPENDENT_AMBULATORY_CARE_PROVIDER_SITE_OTHER): Payer: Self-pay | Admitting: Bariatrics

## 2019-02-16 ENCOUNTER — Other Ambulatory Visit: Payer: Self-pay

## 2019-02-16 ENCOUNTER — Ambulatory Visit (INDEPENDENT_AMBULATORY_CARE_PROVIDER_SITE_OTHER): Payer: Federal, State, Local not specified - PPO | Admitting: Psychology

## 2019-02-16 DIAGNOSIS — F330 Major depressive disorder, recurrent, mild: Secondary | ICD-10-CM | POA: Diagnosis not present

## 2019-02-16 DIAGNOSIS — F411 Generalized anxiety disorder: Secondary | ICD-10-CM | POA: Diagnosis not present

## 2019-02-16 NOTE — Progress Notes (Signed)
Chief Complaint:   OBESITY [name] is here to discuss her progress with her obesity treatment plan along with follow-up of her obesity related diagnoses. [name] is on the Category 3 Plan and states she is following her eating plan approximately 75% of the time. [name] states she is exercising 0 minutes 0 times per week.  Today's visit was #: 6 Starting weight: 315 lbs Starting date: 10/27/2018 Today's weight: 309 lbs Today's date: 02/12/2019 Total lbs lost to date: 6 Total lbs lost since last in-office visit: 0  Interim History: [name]'s weight remains the same. She is making better choices. She states she is struggling with her water intake.  Subjective:   Prediabetes. [name] has a diagnosis of prediabetes based on her elevated HgA1c and was informed this puts her at greater risk of developing diabetes. She continues to work on diet and exercise to decrease her risk of diabetes. She denies nausea or hypoglycemia. She is on no medications.  Lab Results  Component Value Date   HGBA1C 6.1 09/29/2018   Lab Results  Component Value Date   INSULIN 15.4 10/27/2018   Other hyperlipidemia. [name] has hyperlipidemia and has been trying to improve her cholesterol levels with intensive lifestyle modification including a low saturated fat diet, exercise and weight loss. She denies any chest pain, claudication or myalgias. [name] is on no medications.  Lab Results  Component Value Date   ALT 18 09/29/2018   AST 13 09/29/2018   ALKPHOS 91 09/29/2018   BILITOT 0.4 09/29/2018   Lab Results  Component Value Date   CHOL 192 08/14/2017   HDL 33.90 (L) 08/14/2017   LDLCALC 128 (H) 08/14/2017   LDLDIRECT 111.5 09/09/2012   TRIG 150.0 (H) 08/14/2017   CHOLHDL 6 08/14/2017   Assessment/Plan:   Prediabetes. [name] will continue to work on weight loss, exercise, increasing healthy fats and protein, and decreasing simple carbohydrates to help decrease the risk of diabetes.    Other hyperlipidemia. Cardiovascular risk and specific lipid/LDL goals reviewed.  We discussed several lifestyle modifications today and [name] will continue to work on diet, exercise and weight loss efforts. Orders and follow up as documented in patient record. She will continue to decrease trans and saturated fats and will increase MUFA's and PUFA's.  Counseling Intensive lifestyle modifications are the first line treatment for this issue. . Dietary changes: Increase soluble fiber. Decrease simple carbohydrates. . Exercise changes: Moderate to vigorous-intensity aerobic activity 150 minutes per week if tolerated. . Lipid-lowering medications: see documented in medical record.   Class 3 severe obesity with serious comorbidity and body mass index (BMI) of 50.0 to 59.9 in adult, unspecified obesity type ([place]).  [name] is currently in the action stage of change. As such, her goal is to continue with weight loss efforts. She has agreed to the Category 3 Plan.   She will work on meal planning, intentional eating, will be more adherent to the plan, and will increase her water intake.  Exercise goals: [name] will increase her walking very slowly.  Behavioral modification strategies: increasing lean protein intake, decreasing simple carbohydrates, increasing vegetables, increasing water intake, decreasing eating out, no skipping meals, meal planning and cooking strategies, keeping healthy foods in the home and planning for success.  [name] has agreed to follow-up with our clinic in 2 weeks. She was informed of the importance of frequent follow-up visits to maximize her success with intensive lifestyle modifications for her multiple health conditions.   Objective:   Blood  pressure 139/80, pulse 80, temperature 98.6 F (37 C), height 5\' 3"  (1.6 m), weight (!) 309 lb (140.2 kg), SpO2 97 %, unknown if currently breastfeeding. Body mass index is 54.74 kg/m.  General: Cooperative, alert, well  developed, in no acute distress. HEENT: Conjunctivae and lids unremarkable. Cardiovascular: Regular rhythm.  Lungs: Normal work of breathing. Neurologic: No focal deficits.   Lab Results  Component Value Date   CREATININE 0.91 09/29/2018   BUN 11 09/29/2018   NA 138 09/29/2018   K 4.0 09/29/2018   CL 103 09/29/2018   CO2 29 09/29/2018   Lab Results  Component Value Date   ALT 18 09/29/2018   AST 13 09/29/2018   ALKPHOS 91 09/29/2018   BILITOT 0.4 09/29/2018   Lab Results  Component Value Date   HGBA1C 6.1 09/29/2018   HGBA1C 5.9 08/14/2017   HGBA1C 5.8 03/13/2016   HGBA1C 5.5 05/16/2015   HGBA1C 6.1 01/07/2014   Lab Results  Component Value Date   INSULIN 15.4 10/27/2018   Lab Results  Component Value Date   TSH 3.37 09/29/2018   Lab Results  Component Value Date   CHOL 192 08/14/2017   HDL 33.90 (L) 08/14/2017   LDLCALC 128 (H) 08/14/2017   LDLDIRECT 111.5 09/09/2012   TRIG 150.0 (H) 08/14/2017   CHOLHDL 6 08/14/2017   Lab Results  Component Value Date   WBC 6.7 09/29/2018   HGB 12.6 09/29/2018   HCT 37.8 09/29/2018   MCV 87.3 09/29/2018   PLT 292.0 09/29/2018   No results found for: IRON, TIBC, FERRITIN  Attestation Statements:   Reviewed by clinician on day of visit: allergies, medications, problem list, medical history, surgical history, family history, social history, and previous encounter notes.  Time spent on visit including pre-visit chart review and post-visit care was 20 minutes.   Migdalia Dk, am acting as Location manager for [employer], DO   I have reviewed the above documentation for accuracy and completeness, and I agree with the above. Jearld Lesch, DO

## 2019-02-16 NOTE — Progress Notes (Signed)
Virtual Visit via Video Note  I connected with [name] on 02/16/19 at 12:30 PM EST by a video enabled telemedicine application and verified that I am speaking with the correct person using two identifiers.   I discussed the limitations of evaluation and management by telemedicine and the availability of in person appointments. The patient expressed understanding and agreed to proceed.    I discussed the assessment and treatment plan with the patient. The patient was provided an opportunity to ask questions and all were answered. The patient agreed with the plan and demonstrated an understanding of the instructions.   The patient was advised to call back or seek an in-person evaluation if the symptoms worsen or if the condition fails to improve as anticipated.  I provided 45 minutes of non-face-to-face time during this encounter.   [name] [facility]    THERAPIST PROGRESS NOTE  Session Time: 12.30pm-1.15pm  Participation Level: Active  Behavioral Response: Well GroomedAlertaffect wnl  Type of Therapy: Individual Therapy  Treatment Goals addressed: Diagnosis: GAD, MDD and goal 1.  Interventions: CBT, Strength-based and Supportive  Summary: [name] is a 39 y.o. female who presents with affect wnl.  Pt reported that thing are going pretty well w/ mood.  Pt reports progress w/ finances w/ stimulus check and now starting savings- sees path for homeownership.  Pt reports that work is going well and managing w/ home stressors.  Pt reported that she is losing weight and learning to create new routines- see area for continued progress.  Pt reported that she has been thinking more about past relationship problems w/ her husband and feeling hurt and reemerging feelings of not good enough.  Pt is able to have insight into how may be impacted that not a lot of couple time in pandemic and that not a lot of communication when around each other for the whole day.  Pt was able to  identify need to express feelings and ways to focus in present on relationship health with time together- home dates, intention of communication together.     Suicidal/Homicidal: Nowithout intent/plan  Therapist Response: Assessed pt current functioning per pt report.  Processed w/pt coping w/ stressors and rumination on past relationship problems.  Discussed how to assert her feelings and focus on present relationship interactions and growth.  Plan: Return again in 4 weeks, via webex.  Diagnosis: GAD, MDD   [name], [facility] 02/16/2019

## 2019-02-20 DIAGNOSIS — G4733 Obstructive sleep apnea (adult) (pediatric): Secondary | ICD-10-CM | POA: Diagnosis not present

## 2019-02-26 ENCOUNTER — Ambulatory Visit (INDEPENDENT_AMBULATORY_CARE_PROVIDER_SITE_OTHER): Payer: Federal, State, Local not specified - PPO | Admitting: Psychiatry

## 2019-02-26 ENCOUNTER — Other Ambulatory Visit: Payer: Self-pay

## 2019-02-26 ENCOUNTER — Encounter (HOSPITAL_COMMUNITY): Payer: Self-pay | Admitting: Psychiatry

## 2019-02-26 DIAGNOSIS — F17210 Nicotine dependence, cigarettes, uncomplicated: Secondary | ICD-10-CM | POA: Diagnosis not present

## 2019-02-26 DIAGNOSIS — F411 Generalized anxiety disorder: Secondary | ICD-10-CM

## 2019-02-26 DIAGNOSIS — F332 Major depressive disorder, recurrent severe without psychotic features: Secondary | ICD-10-CM

## 2019-02-26 MED ORDER — BUSPIRONE HCL 10 MG PO TABS: 10.0000 mg | ORAL_TABLET | Freq: Three times a day (TID) | ORAL | 0 refills | Status: DC

## 2019-02-26 MED ORDER — FLUOXETINE HCL 20 MG PO CAPS: 60.0000 mg | ORAL_CAPSULE | Freq: Every day | ORAL | 0 refills | Status: DC

## 2019-02-26 NOTE — Progress Notes (Signed)
  Virtual Visit via Telephone Note  I connected with [name]  on 02/26/19 at  8:30 AM EST by telephone and verified that I am speaking with the correct person using two identifiers.  Location: Patient: home Provider: office   I discussed the limitations, risks, security and privacy concerns of performing an evaluation and management service by telephone and the availability of in person appointments. I also discussed with the patient that there may be a patient responsible charge related to this service. The patient expressed understanding and agreed to proceed.   History of Present Illness: [name] reports she is doing ok. She is doing better about taking the afternoon dose of Buspar. She is still working on better compliance on her PM dose. She dose note a difference when she takes the Buspar as prescribed. [name] stills easily irritated but is able to step away and recovers faster. Her sleep is good with her CPAP. Her depression is manageable. [name] denies worthlessness and hopelessness. She denies SI/HI. Her health is starting to get better. 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    Observations/Objective: I spoke with [name] on the phone.  Pt was calm, pleasant and cooperative.  Pt was engaged in the conversation and answered questions appropriately.  Speech was clear and coherent with normal rate, tone and volume.  Mood is euthymic and affect is full. Thought processes are coherent, goal oriented and intact.  Thought content is logical.  Pt denies SI/HI.   Pt denies auditory and visual hallucinations and did not appear to be responding to internal stimuli.  Memory and concentration are good.  Fund of knowledge and use of language are average.  Insight and judgment are fair.  I am unable to comment on psychomotor activity, general appearance, hygiene, or eye contact as I was unable to physically see the patient on the phone.  Vital  signs not available since interview conducted virtually.    I reviewed the information below 02/26/19 and have updated it Assessment and Plan: MDD-recurrent, severe without psychotic features; GAD; Social anxiety disorder; Nicotine use do   Status of current symptoms: overall stable depression and anxiety; still smoking and not yet ready to quit   Buspar 10mg  po TID for GAD and MDD   Prozac 60mg  po qD for MDD, GAD and social anxiety   Discussed smoking cessation in detail- I recommended she call 1-800-quitline when she is ready to quit. Wellbutrin was ineffective.       Follow Up Instructions: In 3 months or sooner if needed   I discussed the assessment and treatment plan with the patient. The patient was provided an opportunity to ask questions and all were answered. The patient agreed with the plan and demonstrated an understanding of the instructions.   The patient was advised to call back or seek an in-person evaluation if the symptoms worsen or if the condition fails to improve as anticipated.  I provided 15 minutes of non-face-to-face time during this encounter.   Charlcie Cradle, MD

## 2019-03-05 ENCOUNTER — Encounter (INDEPENDENT_AMBULATORY_CARE_PROVIDER_SITE_OTHER): Payer: Self-pay | Admitting: Bariatrics

## 2019-03-05 ENCOUNTER — Ambulatory Visit (INDEPENDENT_AMBULATORY_CARE_PROVIDER_SITE_OTHER): Payer: Federal, State, Local not specified - PPO | Admitting: Bariatrics

## 2019-03-05 ENCOUNTER — Other Ambulatory Visit: Payer: Self-pay

## 2019-03-05 VITALS — BP 117/81 | HR 84 | Temp 98.8°F | Ht 63.0 in | Wt 299.0 lb

## 2019-03-05 DIAGNOSIS — E559 Vitamin D deficiency, unspecified: Secondary | ICD-10-CM | POA: Diagnosis not present

## 2019-03-05 DIAGNOSIS — R7303 Prediabetes: Secondary | ICD-10-CM

## 2019-03-05 DIAGNOSIS — Z9189 Other specified personal risk factors, not elsewhere classified: Secondary | ICD-10-CM | POA: Diagnosis not present

## 2019-03-05 DIAGNOSIS — Z6841 Body Mass Index (BMI) 40.0 and over, adult: Secondary | ICD-10-CM

## 2019-03-05 NOTE — Progress Notes (Signed)
Chief Complaint:   OBESITY [name] is here to discuss her progress with her obesity treatment plan along with follow-up of her obesity related diagnoses. [name] is on the Category 3 Plan and states she is following her eating plan approximately 50% of the time. [name] states she is exercising 0 minutes 0 times per week.  Today's visit was #: 7 Starting weight: 315 lbs Starting date: 10/27/2018 Today's weight: 299 lbs Today's date: 03/05/2019 Total lbs lost to date: 16 Total lbs lost since last in-office visit: 10  Interim History: [name] is down 10 lbs and doing well overall. She stopped her protein shake. She is not getting adequate water. She reports eating more protein.  Subjective:   Prediabetes. [name] has a diagnosis of prediabetes based on her elevated HgA1c and was informed this puts her at greater risk of developing diabetes. She continues to work on diet and exercise to decrease her risk of diabetes. She denies nausea or hypoglycemia.  Lab Results  Component Value Date   HGBA1C 6.1 09/29/2018   Lab Results  Component Value Date   INSULIN 15.4 10/27/2018    Vitamin D deficiency. [name] is taking Vitamin D. Last Vitamin D 20.59 on 09/29/2018.  At risk for hypoglycemia. [name] is at increased risk for hypoglycemia due to increased protein and healthy fats.  Assessment/Plan:   Prediabetes. [name] will continue to work on weight loss, exercise, and decreasing simple carbohydrates to help decrease the risk of diabetes. Hemoglobin A1c, Insulin, random, Comprehensive metabolic panel labs ordered.  Vitamin D deficiency. Low Vitamin D level contributes to fatigue and are associated with obesity, breast, and colon cancer. She agrees to continue to take Vitamin D and VITAMIN D 25 Hydroxy (Vit-D Deficiency, Fractures) level was ordered.  At risk for hypoglycemia. [name] was given approximately 15 minutes of counseling today regarding prevention of  hypoglycemia. She was advised of symptoms of hypoglycemia. [name] was instructed to avoid skipping meals, eat regular protein rich meals and schedule low calorie snacks as needed.   Repetitive spaced learning was employed today to elicit superior memory formation and behavioral change.  Class 3 severe obesity with serious comorbidity and body mass index (BMI) of 50.0 to 59.9 in adult, unspecified obesity type ([place]).  [name] is currently in the action stage of change. As such, her goal is to continue with weight loss efforts. She has agreed to the Category 3 Plan.   She will work on meal planning, intentional eating, eating all breakfasts, and increasing her water intake.  Exercise goals: All adults should avoid inactivity. Some physical activity is better than none, and adults who participate in any amount of physical activity gain some health benefits.  Behavioral modification strategies: increasing lean protein intake, decreasing simple carbohydrates, increasing vegetables, increasing water intake, decreasing eating out, no skipping meals, meal planning and cooking strategies, keeping healthy foods in the home and planning for success.  [name] has agreed to follow-up with our clinic in 2 weeks. She was informed of the importance of frequent follow-up visits to maximize her success with intensive lifestyle modifications for her multiple health conditions.   [name] was informed we would discuss her lab results at her next visit unless there is a critical issue that needs to be addressed sooner. [name] agreed to keep her next visit at the agreed upon time to discuss these results.  Objective:   Blood pressure 117/81, pulse 84, temperature 98.8 F (37.1 C), height 5\' 3"  (1.6 m), weight 299  lb (135.6 kg), last menstrual period 03/04/2019, SpO2 97 %, unknown if currently breastfeeding. Body mass index is 52.97 kg/m.  General: Cooperative, alert, well developed, in no acute distress. HEENT:  Conjunctivae and lids unremarkable. Cardiovascular: Regular rhythm.  Lungs: Normal work of breathing. Neurologic: No focal deficits.   Lab Results  Component Value Date   CREATININE 0.91 09/29/2018   BUN 11 09/29/2018   NA 138 09/29/2018   K 4.0 09/29/2018   CL 103 09/29/2018   CO2 29 09/29/2018   Lab Results  Component Value Date   ALT 18 09/29/2018   AST 13 09/29/2018   ALKPHOS 91 09/29/2018   BILITOT 0.4 09/29/2018   Lab Results  Component Value Date   HGBA1C 6.1 09/29/2018   HGBA1C 5.9 08/14/2017   HGBA1C 5.8 03/13/2016   HGBA1C 5.5 05/16/2015   HGBA1C 6.1 01/07/2014   Lab Results  Component Value Date   INSULIN 15.4 10/27/2018   Lab Results  Component Value Date   TSH 3.37 09/29/2018   Lab Results  Component Value Date   CHOL 192 08/14/2017   HDL 33.90 (L) 08/14/2017   LDLCALC 128 (H) 08/14/2017   LDLDIRECT 111.5 09/09/2012   TRIG 150.0 (H) 08/14/2017   CHOLHDL 6 08/14/2017   Lab Results  Component Value Date   WBC 6.7 09/29/2018   HGB 12.6 09/29/2018   HCT 37.8 09/29/2018   MCV 87.3 09/29/2018   PLT 292.0 09/29/2018   No results found for: IRON, TIBC, FERRITIN  Attestation Statements:   Reviewed by clinician on day of visit: allergies, medications, problem list, medical history, surgical history, family history, social history, and previous encounter notes.  Migdalia Dk, am acting as Location manager for [employer], DO   I have reviewed the above documentation for accuracy and completeness, and I agree with the above. Jearld Lesch, DO

## 2019-03-07 LAB — COMPREHENSIVE METABOLIC PANEL
ALT: 16 IU/L (ref 0–32)
AST: 16 IU/L (ref 0–40)
Albumin/Globulin Ratio: 1.9 (ref 1.2–2.2)
Albumin: 4.3 g/dL (ref 3.8–4.8)
Alkaline Phosphatase: 105 IU/L (ref 39–117)
BUN/Creatinine Ratio: 19 (ref 9–23)
BUN: 14 mg/dL (ref 6–20)
Bilirubin Total: 0.3 mg/dL (ref 0.0–1.2)
CO2: 23 mmol/L (ref 20–29)
Calcium: 9.3 mg/dL (ref 8.7–10.2)
Chloride: 105 mmol/L (ref 96–106)
Creatinine, Ser: 0.75 mg/dL (ref 0.57–1.00)
GFR calc Af Amer: 116 mL/min/{1.73_m2} (ref >59)
GFR calc non Af Amer: 101 mL/min/{1.73_m2} (ref >59)
Globulin, Total: 2.3 g/dL (ref 1.5–4.5)
Glucose: 102 mg/dL — ABNORMAL HIGH (ref 65–99)
Potassium: 4.4 mmol/L (ref 3.5–5.2)
Sodium: 142 mmol/L (ref 134–144)
Total Protein: 6.6 g/dL (ref 6.0–8.5)

## 2019-03-07 LAB — HEMOGLOBIN A1C
Est. average glucose Bld gHb Est-mCnc: 114 mg/dL
Hgb A1c MFr Bld: 5.6 % (ref 4.8–5.6)

## 2019-03-07 LAB — VITAMIN D 25 HYDROXY (VIT D DEFICIENCY, FRACTURES): Vit D, 25-Hydroxy: 39.5 ng/mL (ref 30.0–100.0)

## 2019-03-07 LAB — INSULIN, RANDOM: INSULIN: 14.9 u[IU]/mL (ref 2.6–24.9)

## 2019-03-16 ENCOUNTER — Ambulatory Visit (HOSPITAL_COMMUNITY): Payer: Federal, State, Local not specified - PPO | Admitting: Psychology

## 2019-03-16 ENCOUNTER — Encounter (HOSPITAL_COMMUNITY): Payer: Self-pay | Admitting: Psychology

## 2019-03-16 ENCOUNTER — Other Ambulatory Visit: Payer: Self-pay

## 2019-03-16 NOTE — Progress Notes (Signed)
[name] is a 39 y.o. female patient who connected to her virtual webex appointment and informed she would have to cancel.  Pt was at the car dealership- still awaiting repairs- had thought she would have been home by now.  Pt doesn't have a private space for appointment.  Pt is already scheduled for next month.[name], [facility]

## 2019-03-19 ENCOUNTER — Ambulatory Visit (INDEPENDENT_AMBULATORY_CARE_PROVIDER_SITE_OTHER): Payer: Federal, State, Local not specified - PPO | Admitting: Bariatrics

## 2019-03-19 ENCOUNTER — Other Ambulatory Visit: Payer: Self-pay

## 2019-03-19 ENCOUNTER — Encounter (INDEPENDENT_AMBULATORY_CARE_PROVIDER_SITE_OTHER): Payer: Self-pay | Admitting: Bariatrics

## 2019-03-19 VITALS — BP 133/80 | HR 76 | Temp 98.3°F | Ht 63.0 in | Wt 302.0 lb

## 2019-03-19 DIAGNOSIS — E7849 Other hyperlipidemia: Secondary | ICD-10-CM | POA: Diagnosis not present

## 2019-03-19 DIAGNOSIS — E559 Vitamin D deficiency, unspecified: Secondary | ICD-10-CM

## 2019-03-19 DIAGNOSIS — Z9189 Other specified personal risk factors, not elsewhere classified: Secondary | ICD-10-CM | POA: Diagnosis not present

## 2019-03-19 DIAGNOSIS — Z6841 Body Mass Index (BMI) 40.0 and over, adult: Secondary | ICD-10-CM

## 2019-03-19 DIAGNOSIS — G4733 Obstructive sleep apnea (adult) (pediatric): Secondary | ICD-10-CM | POA: Diagnosis not present

## 2019-03-19 MED ORDER — VITAMIN D3 1.25 MG (50000 UT) PO CAPS: 1.0000 | ORAL_CAPSULE | ORAL | 0 refills | Status: DC

## 2019-03-19 NOTE — Progress Notes (Signed)
Chief Complaint:   OBESITY [name] is here to discuss her progress with her obesity treatment plan along with follow-up of her obesity related diagnoses. [name] is on the Category 3 Plan and states she is following her eating plan approximately 50% of the time. [name] states she is exercising 0 minutes 0 times per week.  Today's visit was #: 8 Starting weight: 315 lbs Starting date: 10/27/2018 Today's weight: 302 lbs Today's date: 03/19/2019 Total lbs lost to date: 13 Total lbs lost since last in-office visit: 0  Interim History: [name] is up 3 lbs. She was not following her plan as well.  Subjective:   Other hyperlipidemia. [name] has hyperlipidemia and has been trying to improve her cholesterol levels with intensive lifestyle modification including a low saturated fat diet, exercise and weight loss. She denies any chest pain, claudication or myalgias. [name] is on no medications.   Lab Results  Component Value Date   ALT 16 03/05/2019   AST 16 03/05/2019   ALKPHOS 105 03/05/2019   BILITOT 0.3 03/05/2019   Lab Results  Component Value Date   CHOL 192 08/14/2017   HDL 33.90 (L) 08/14/2017   LDLCALC 128 (H) 08/14/2017   LDLDIRECT 111.5 09/09/2012   TRIG 150.0 (H) 08/14/2017   CHOLHDL 6 08/14/2017    OSA (obstructive sleep apnea), CPAP. [name] is using a CPAP and reports restful sleep.  Vitamin D deficiency. No nausea, vomiting, or muscle weakness. Last Vitamin D 39.5 on 03/05/2019 (up from 20).  At risk for heart disease. [name] is at a higher than average risk for cardiovascular disease due to obesity and hyperlipidemia. Reviewed: no chest pain on exertion, no dyspnea on exertion, and no swelling of ankles.  Assessment/Plan:   Other hyperlipidemia. Cardiovascular risk and specific lipid/LDL goals reviewed.  We discussed several lifestyle modifications today and [name] will continue to work on diet, exercise and weight loss efforts. Orders and follow  up as documented in patient record. She will decrease trans and saturated fats and will increase PUFA's and MUFA's.  Counseling Intensive lifestyle modifications are the first line treatment for this issue. . Dietary changes: Increase soluble fiber. Decrease simple carbohydrates. . Exercise changes: Moderate to vigorous-intensity aerobic activity 150 minutes per week if tolerated. . Lipid-lowering medications: see documented in medical record.  OSA (obstructive sleep apnea), CPAP. Intensive lifestyle modifications are the first line treatment for this issue. We discussed several lifestyle modifications today and she will continue to work on diet, exercise and weight loss efforts. We will continue to monitor. Orders and follow up as documented in patient record. [name] will continue to use her CPAP.  Counseling  Sleep apnea is a condition in which breathing pauses or becomes shallow during sleep. This happens over and over during the night. This disrupts your sleep and keeps your body from getting the rest that it needs, which can cause tiredness and lack of energy (fatigue) during the day.  Sleep apnea treatment: If you were given a device to open your airway while you sleep, USE IT!  Sleep hygiene:   Limit or avoid alcohol, caffeinated beverages, and cigarettes, especially close to bedtime.   Do not eat a large meal or eat spicy foods right before bedtime. This can lead to digestive discomfort that can make it hard for you to sleep.  Keep a sleep diary to help you and your health care provider figure out what could be causing your insomnia.  . Make your bedroom a  dark, comfortable place where it is easy to fall asleep. ? Put up shades or blackout curtains to block light from outside. ? Use a white noise machine to block noise. ? Keep the temperature cool. . Limit screen use before bedtime. This includes: ? Watching TV. ? Using your smartphone, tablet, or computer. . Stick to a routine  that includes going to bed and waking up at the same times every day and night. This can help you fall asleep faster. Consider making a quiet activity, such as reading, part of your nighttime routine. . Try to avoid taking naps during the day so that you sleep better at night. . Get out of bed if you are still awake after 15 minutes of trying to sleep. Keep the lights down, but try reading or doing a quiet activity. When you feel sleepy, go back to bed.  Vitamin D deficiency. Low Vitamin D level contributes to fatigue and are associated with obesity, breast, and colon cancer. She was given a prescription for Cholecalciferol (VITAMIN D3) 1.25 MG (50000 UT) CAPS every week #4 with 0 refills and will follow-up for routine testing of Vitamin D, at least 2-3 times per year to avoid over-replacement.     At risk for heart disease. [name] was given approximately 15 minutes of coronary artery disease prevention counseling today. She is 39 y.o. female and has risk factors for heart disease including obesity. We discussed intensive lifestyle modifications today with an emphasis on specific weight loss instructions and strategies.   Repetitive spaced learning was employed today to elicit superior memory formation and behavioral change.  Class 3 severe obesity with serious comorbidity and body mass index (BMI) of 50.0 to 59.9 in adult, unspecified obesity type ([place]).  [name] is currently in the action stage of change. As such, her goal is to continue with weight loss efforts. She has agreed to the Category 3 Plan.   She will work on meal planning and intentional eating.  Handout was given on "Eating Out."  Labs from 03/05/2019 were reviewed with the patient including CMP, Vitamin D, A1c, and insulin.  Exercise goals: [name] will start walking.  Behavioral modification strategies: increasing lean protein intake, decreasing simple carbohydrates, increasing vegetables, increasing water intake, decreasing  eating out, meal planning and cooking strategies, keeping healthy foods in the home and planning for success.  [name] has agreed to follow-up with our clinic in 2 weeks. She was informed of the importance of frequent follow-up visits to maximize her success with intensive lifestyle modifications for her multiple health conditions.   Objective:   Blood pressure 133/80, pulse 76, temperature 98.3 F (36.8 C), height 5\' 3"  (1.6 m), weight (!) 302 lb (137 kg), last menstrual period 03/04/2019, SpO2 97 %, unknown if currently breastfeeding. Body mass index is 53.5 kg/m.  General: Cooperative, alert, well developed, in no acute distress. HEENT: Conjunctivae and lids unremarkable. Cardiovascular: Regular rhythm.  Lungs: Normal work of breathing. Neurologic: No focal deficits.   Lab Results  Component Value Date   CREATININE 0.75 03/05/2019   BUN 14 03/05/2019   NA 142 03/05/2019   K 4.4 03/05/2019   CL 105 03/05/2019   CO2 23 03/05/2019   Lab Results  Component Value Date   ALT 16 03/05/2019   AST 16 03/05/2019   ALKPHOS 105 03/05/2019   BILITOT 0.3 03/05/2019   Lab Results  Component Value Date   HGBA1C 5.6 03/05/2019   HGBA1C 6.1 09/29/2018   HGBA1C 5.9 08/14/2017  HGBA1C 5.8 03/13/2016   HGBA1C 5.5 05/16/2015   Lab Results  Component Value Date   INSULIN 14.9 03/05/2019   INSULIN 15.4 10/27/2018   Lab Results  Component Value Date   TSH 3.37 09/29/2018   Lab Results  Component Value Date   CHOL 192 08/14/2017   HDL 33.90 (L) 08/14/2017   LDLCALC 128 (H) 08/14/2017   LDLDIRECT 111.5 09/09/2012   TRIG 150.0 (H) 08/14/2017   CHOLHDL 6 08/14/2017   Lab Results  Component Value Date   WBC 6.7 09/29/2018   HGB 12.6 09/29/2018   HCT 37.8 09/29/2018   MCV 87.3 09/29/2018   PLT 292.0 09/29/2018   No results found for: IRON, TIBC, FERRITIN  Attestation Statements:   Reviewed by clinician on day of visit: allergies, medications, problem list, medical history,  surgical history, family history, social history, and previous encounter notes.  Time spent on visit including pre-visit chart review and post-visit charting and care was 20 minutes.   Migdalia Dk, am acting as Location manager for [employer], DO   I have reviewed the above documentation for accuracy and completeness, and I agree with the above. Jearld Lesch, DO

## 2019-04-07 ENCOUNTER — Other Ambulatory Visit: Payer: Self-pay

## 2019-04-07 ENCOUNTER — Encounter: Payer: Self-pay | Admitting: Family

## 2019-04-07 ENCOUNTER — Ambulatory Visit (INDEPENDENT_AMBULATORY_CARE_PROVIDER_SITE_OTHER): Payer: Federal, State, Local not specified - PPO | Admitting: Bariatrics

## 2019-04-07 ENCOUNTER — Ambulatory Visit (INDEPENDENT_AMBULATORY_CARE_PROVIDER_SITE_OTHER): Payer: Federal, State, Local not specified - PPO | Admitting: Family

## 2019-04-07 ENCOUNTER — Encounter: Payer: Federal, State, Local not specified - PPO | Admitting: Family

## 2019-04-07 VITALS — BP 122/86 | HR 83 | Temp 98.8°F | Ht 63.0 in | Wt 307.1 lb

## 2019-04-07 DIAGNOSIS — Z0000 Encounter for general adult medical examination without abnormal findings: Secondary | ICD-10-CM | POA: Diagnosis not present

## 2019-04-07 LAB — CBC WITH DIFFERENTIAL/PLATELET
Basophils Absolute: 0 10*3/uL (ref 0.0–0.1)
Basophils Relative: 0.5 % (ref 0.0–3.0)
Eosinophils Absolute: 0.1 10*3/uL (ref 0.0–0.7)
Eosinophils Relative: 1.9 % (ref 0.0–5.0)
HCT: 37.2 % (ref 36.0–46.0)
Hemoglobin: 12.5 g/dL (ref 12.0–15.0)
Lymphocytes Relative: 25.1 % (ref 12.0–46.0)
Lymphs Abs: 1.7 10*3/uL (ref 0.7–4.0)
MCHC: 33.7 g/dL (ref 30.0–36.0)
MCV: 86.2 fl (ref 78.0–100.0)
Monocytes Absolute: 0.3 10*3/uL (ref 0.1–1.0)
Monocytes Relative: 4.8 % (ref 3.0–12.0)
Neutro Abs: 4.7 10*3/uL (ref 1.4–7.7)
Neutrophils Relative %: 67.7 % (ref 43.0–77.0)
Platelets: 256 10*3/uL (ref 150.0–400.0)
RBC: 4.32 Mil/uL (ref 3.87–5.11)
RDW: 14.8 % (ref 11.5–15.5)
WBC: 6.9 10*3/uL (ref 4.0–10.5)

## 2019-04-07 LAB — LIPID PANEL
Cholesterol: 197 mg/dL (ref 0–200)
HDL: 32.9 mg/dL — ABNORMAL LOW (ref >39.00)
LDL Cholesterol: 145 mg/dL — ABNORMAL HIGH (ref 0–99)
NonHDL: 164.15
Total CHOL/HDL Ratio: 6
Triglycerides: 97 mg/dL (ref 0.0–149.0)
VLDL: 19.4 mg/dL (ref 0.0–40.0)

## 2019-04-07 LAB — TSH: TSH: 2.81 u[IU]/mL (ref 0.35–4.50)

## 2019-04-07 NOTE — Progress Notes (Signed)
[name] is a 39 y.o. female with the following history as recorded in EpicCare:  Patient Active Problem List   Diagnosis Date Noted  . Low back pain 07/04/2018  . 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 Neck pain 08/14/2017  . Pre-diabetes 08/14/2017  . Nicotine dependence, cigarettes, uncomplicated [id number]  . Fatigue 11/03/2015  . Allergic rhinitis 05/16/2015  . S/P C-section 12/18/2014  . Hyperlipidemia 12/16/2014  . Hx of herpes simplex infection 11/29/2014  . Major depressive disorder, recurrent, severe without psychotic features ([place]) 09/23/2014  . GAD (generalized anxiety disorder) 09/23/2014  . Social anxiety disorder 09/23/2014  . Routine general medical examination at a health care facility 01/07/2014  . OSA on CPAP 11/26/2011  . Morbid obesity ([place]) 05/17/2011  . Patellofemoral syndrome of right knee 02/16/2011  . Depression 02/16/2011  . Hx gestational diabetes 02/16/2011  . Eczema 02/16/2011    Current Outpatient Medications  Medication Sig Dispense Refill  . busPIRone (BUSPAR) 10 MG tablet Take 1 tablet (10 mg total) by mouth 3 (three) times daily. For mood control 270 tablet 0  . calcium-vitamin D (OSCAL WITH D) 500-200 MG-UNIT tablet Take 1 tablet by mouth. Patient taking 5000 units per day    . celecoxib (CELEBREX) 200 MG capsule Take 200 mg by mouth 2 (two) times daily.     . Cholecalciferol (VITAMIN D3) 1.25 MG (50000 UT) CAPS Take 1 capsule by mouth every 7 (seven) days. 4 capsule 0  . FLUoxetine (PROZAC) 20 MG capsule Take 3 capsules (60 mg total) by mouth daily. Mood control 270 capsule 0  . Multiple Vitamin (MULTIVITAMIN) capsule Take 1 capsule by mouth daily.    . vitamin B-12 (CYANOCOBALAMIN) 500 MCG tablet Take 1,000 mcg by mouth daily. Patient reports taking 5000 mcg per day    . baclofen (LIORESAL) 10 MG tablet baclofen 10 mg tablet  TAKE 1 TABLET BY  MOUTH TWICE A DAY AS NEEDED FOR MUSCLE SPASMS    . buPROPion (WELLBUTRIN XL) 300 MG 24 hr tablet bupropion HCl XL 300 mg 24 hr tablet, extended release  TAKE 1 TABLET BY MOUTH EVERY DAY    . ergocalciferol (VITAMIN D2) 1.25 MG (50000 UT) capsule ergocalciferol (vitamin D2) 1,250 mcg (50,000 unit) capsule  TAKE 1 CAPSULE BY MOUTH EVERY 7 DAYS FOR 12 DOSES    . ibuprofen (ADVIL) 800 MG tablet ibuprofen 800 mg tablet  TAKE 1 TABLET BY MOUTH THREE TIMES A DAY WITH FOOD OR MILK AS NEEDED     No current facility-administered medications for this visit.    Allergies: Codeine and Doxycycline  Past Medical History:  Diagnosis Date  . Anxiety   . Back pain   . Bilateral swelling of feet   . Chest pain   . Cluster headaches   . Depression   . Eczema   . GERD (gastroesophageal reflux disease)   . Gestational diabetes 2012, 2016  . Hyperlipidemia   . Hypertension    no meds currently  . Joint pain   . Osteoarthritis   . PCOS (polycystic ovarian syndrome)   . Pre-diabetes   . Sciatica   . Shortness of breath   . Sleep apnea   . Snoring    sleep study 02/2012 with min AHI events  . Vitamin D deficiency     Past Surgical History:  Procedure Laterality Date  . CESAREAN SECTION  09 & 11   x's 2  .  CESAREAN SECTION WITH BILATERAL TUBAL LIGATION Bilateral 12/18/2014   Procedure: REPEAT CESAREAN SECTION WITH BILATERAL TUBAL LIGATION;  Surgeon: Crawford Givens, MD;  Location: [facility];  Service: Obstetrics;  Laterality: Bilateral;  . COLPOSCOPY    . DENTAL SURGERY    . Ganglion removal from (L) wrist  1998  . LAPAROSCOPIC UNILATERAL SALPINGECTOMY Left 01/26/2015   Procedure: LAPAROSCOPIC UNILATERAL SALPINGECTOMY;  Surgeon: Crawford Givens, MD;  Location: [facility];  Service: Gynecology;  Laterality: Left;  . WISDOM TOOTH EXTRACTION      Family History  Problem Relation Age of Onset  . Arthritis Mother   . Alcohol abuse Mother   . Mental illness Mother   . Cancer Mother 12       squamous -  unknown primary  . Coronary artery disease Mother 12       MI with stent  . Anxiety disorder Mother   . Depression Mother   . Diabetes Mother   . Obesity Mother   . Arthritis Father   . Alcohol abuse Father   . Hyperlipidemia Father   . Heart disease Father   . Kidney disease Father   . Stroke Father   . Mental illness Father   . Drug abuse Father   . Hypertension Father   . Depression Father   . Alcoholism Father   . Diabetes Maternal Grandmother   . Heart disease Maternal Grandmother   . Stroke Maternal Grandfather   . Hypertension Paternal Grandfather   . Hyperlipidemia Paternal Grandfather   . Arthritis Other   . Alcohol abuse Other   . Breast cancer Paternal Aunt   . Anxiety disorder Sister     Social History   Tobacco Use  . Smoking status: Current Every Day Smoker    Packs/day: 0.25    Years: 15.00    Pack years: 3.75    Types: Cigarettes  . Smokeless tobacco: Never Used  Substance Use Topics  . Alcohol use: Yes    Comment: once a month or less    Subjective:  Presents for yearly CPE; is working with [facility] [employer] [facility]- admits she has not been following her plan and knows she needs to work on healthy eating/ exercise; started at 321- down to 307;  Scheduled to see GYN- LMP 3/32/21; BTL  Up to date on eye doctor and dentist;   Review of Systems  Constitutional: Positive for weight loss.       Working with weight loss specialist  HENT: Negative.   Eyes: Negative.   Respiratory: Negative.   Cardiovascular: Negative.   Gastrointestinal: Negative.   Genitourinary: Negative.   Musculoskeletal: Negative.   Skin: Negative.   Neurological: Negative.   Endo/Heme/Allergies: Negative.   Psychiatric/Behavioral: The patient is nervous/anxious.        Under care of behavioral health     Objective:  Vitals:   04/07/19 0903  BP: 122/86  Pulse: 83  Temp: 98.8 F (37.1 C)  TempSrc: Oral  Weight: (!) 307 lb 2 oz (139.3 kg)  Height: 5\' 3"  (1.6 m)     General: Well developed, well nourished, in no acute distress  Skin : Warm and dry.  Head: Normocephalic and atraumatic  Eyes: Sclera and conjunctiva clear; pupils round and reactive to light; extraocular movements intact  Ears: External normal; canals clear; tympanic membranes normal  Oropharynx: Pink, supple. No suspicious lesions  Neck: Supple without thyromegaly, adenopathy  Lungs: Respirations unlabored; clear to auscultation bilaterally without wheeze, rales, rhonchi  CVS exam:  normal rate and regular rhythm.  Abdomen: Soft; nontender; nondistended; normoactive bowel sounds; no masses or hepatosplenomegaly  Musculoskeletal: No deformities; no active joint inflammation  Extremities: No edema, cyanosis, clubbing  Vessels: Symmetric bilaterally  Neurologic: Alert and oriented; speech intact; face symmetrical; moves all extremities well; CNII-XII intact without focal deficit  Assessment:  1. Annual physical exam     Plan:  Age appropriate preventive healthcare needs addressed; encouraged regular eye doctor and dental exams; encouraged regular exercise and weight loss; encouraged to start working on quitting smoking; follow-up with counselor for CBT to help with her motivation with weight loss goals. Will update labs and refills as needed today; follow-up to be determined;  This visit occurred during the SARS-CoV-2 public health emergency.  Safety protocols were in place, including screening questions prior to the visit, additional usage of staff PPE, and extensive cleaning of exam room while observing appropriate contact time as indicated for disinfecting solutions.      No follow-ups on file.  Orders Placed This Encounter  Procedures  . Lipid panel  . TSH  . CBC with Differential/Platelet    Requested Prescriptions    No prescriptions requested or ordered in this encounter

## 2019-04-08 ENCOUNTER — Encounter: Payer: Federal, State, Local not specified - PPO | Admitting: Family

## 2019-04-13 ENCOUNTER — Ambulatory Visit (INDEPENDENT_AMBULATORY_CARE_PROVIDER_SITE_OTHER): Payer: Federal, State, Local not specified - PPO | Admitting: Psychology

## 2019-04-13 ENCOUNTER — Other Ambulatory Visit: Payer: Self-pay

## 2019-04-13 DIAGNOSIS — F330 Major depressive disorder, recurrent, mild: Secondary | ICD-10-CM

## 2019-04-13 DIAGNOSIS — F411 Generalized anxiety disorder: Secondary | ICD-10-CM | POA: Diagnosis not present

## 2019-04-13 NOTE — Progress Notes (Signed)
Virtual Visit via Video Note  I connected with [name] on 04/13/19 at 12:30 PM EDT by a video enabled telemedicine application , pt wasn't able to join w/ video today,and verified that I am speaking with the correct person using two identifiers.   I discussed the limitations of evaluation and management by telemedicine and the availability of in person appointments. The patient expressed understanding and agreed to proceed.   I discussed the assessment and treatment plan with the patient. The patient was provided an opportunity to ask questions and all were answered. The patient agreed with the plan and demonstrated an understanding of the instructions.   The patient was advised to call back or seek an in-person evaluation if the symptoms worsen or if the condition fails to improve as anticipated.  I provided 25 minutes of non-face-to-face time during this encounter.   [name], [facility]    THERAPIST PROGRESS NOTE  Session Time: 12.32pm-12.57pm  Participation Level: Active  Behavioral Response: NAAlertaffect wnl  Type of Therapy: Individual Therapy  Treatment Goals addressed: Diagnosis: GAD, MDD and goal 1.  Interventions: CBT  Summary: [name] is a 39 y.o. female who presents with affect wnl.  Pt reported that she is exhausted from her weekend travels and going into a busy week w/ work and training on top of that.  Pt reported that was positive to get away and see family she hadn't in awhile.  Pt reported that her son has been going through evaluations- dx w/ ADHD receiving Speech therapy, OT and has been referred for assessment of potential ASD.  Pt reported that feels good about steps forward but that a lot to manage as well.  Pt reported that did have a [place] and he ran off when she turned her head.  Pt reported cause a lot of panic and still feels recovering from anxiety caused.  Pt focused on strengths and how managing through given  stressors. Suicidal/Homicidal: Nowithout intent/plan  Therapist Response: Assessed pt current functioning per pt report.  Processed w/pt coping w/ stressors recent and ways she is managing w/ coping skills.    Plan: Return again in 4 weeks, via webex.  F/u as scheduled w/ Dr. Doyne Keel.  Diagnosis: GAD, MDD  [name], [facility] 04/13/2019

## 2019-04-14 ENCOUNTER — Encounter (INDEPENDENT_AMBULATORY_CARE_PROVIDER_SITE_OTHER): Payer: Self-pay | Admitting: Family Medicine

## 2019-04-14 ENCOUNTER — Ambulatory Visit (INDEPENDENT_AMBULATORY_CARE_PROVIDER_SITE_OTHER): Payer: Federal, State, Local not specified - PPO | Admitting: Family Medicine

## 2019-04-14 ENCOUNTER — Other Ambulatory Visit: Payer: Self-pay

## 2019-04-14 VITALS — BP 127/79 | HR 81 | Temp 98.1°F | Ht 63.0 in | Wt 305.0 lb

## 2019-04-14 DIAGNOSIS — R7303 Prediabetes: Secondary | ICD-10-CM

## 2019-04-14 DIAGNOSIS — Z6841 Body Mass Index (BMI) 40.0 and over, adult: Secondary | ICD-10-CM

## 2019-04-14 NOTE — Progress Notes (Signed)
Chief Complaint:   OBESITY [name] is here to discuss her progress with her obesity treatment plan along with follow-up of her obesity related diagnoses. [name] is on the Category 3 Plan and states she is following her eating plan approximately 50% of the time. [name] states she is doing 0 minutes 0 times per week.  Today's visit was #: 9 Starting weight: 315 lbs Starting date: 10/27/2018 Today's weight: 305 lbs Today's date: 04/14/2019 Total lbs lost to date: 10 Total lbs lost since last in-office visit: 0  Interim History: [name] notes she has been cooking less which causes her to be off the plan. She has struggled with sticking to the plan over the past few months due to things being very busy at her house. The temptation of [employer] [employer] food has also her to be off the plan.  Subjective:   1. Pre-diabetes [name] is not on metformin. She denies polyphagia or emotional eating. Last A1c was 5.6.  Assessment/Plan:   1. Pre-diabetes [name] will continue her meal plan, and will continue to work on weight loss, exercise, and decreasing simple carbohydrates to help decrease the risk of diabetes.   2. Class 3 severe obesity with serious comorbidity and body mass index (BMI) of 50.0 to 59.9 in adult, unspecified obesity type ([name]) [name] is currently in the action stage of change. As such, her goal is to continue with weight loss efforts. She has agreed to the Category 3 Plan with additional lunch options.   Handout given today: Protein Equivalents. [name] will start meal planning more.  Exercise goals: No exercise has been prescribed at this time.  Behavioral modification strategies: increasing lean protein intake, decreasing simple carbohydrates, meal planning and cooking strategies and planning for success.  [name] has agreed to follow-up with our clinic in 2 to 3 weeks. She was informed of the importance of frequent follow-up visits to maximize her success with intensive  lifestyle modifications for her multiple health conditions.   Objective:   Blood pressure 127/79, pulse 81, temperature 98.1 F (36.7 C), temperature source Oral, height 5\' 3"  (1.6 m), weight (!) 305 lb (138.3 kg), last menstrual period 03/31/2019, SpO2 98 %, unknown if currently breastfeeding. Body mass index is 54.03 kg/m.  General: Cooperative, alert, well developed, in no acute distress. HEENT: Conjunctivae and lids unremarkable. Cardiovascular: Regular rhythm.  Lungs: Normal work of breathing. Neurologic: No focal deficits.   Lab Results  Component Value Date   CREATININE 0.75 03/05/2019   BUN 14 03/05/2019   NA 142 03/05/2019   K 4.4 03/05/2019   CL 105 03/05/2019   CO2 23 03/05/2019   Lab Results  Component Value Date   ALT 16 03/05/2019   AST 16 03/05/2019   ALKPHOS 105 03/05/2019   BILITOT 0.3 03/05/2019   Lab Results  Component Value Date   HGBA1C 5.6 03/05/2019   HGBA1C 6.1 09/29/2018   HGBA1C 5.9 08/14/2017   HGBA1C 5.8 03/13/2016   HGBA1C 5.5 05/16/2015   Lab Results  Component Value Date   INSULIN 14.9 03/05/2019   INSULIN 15.4 10/27/2018   Lab Results  Component Value Date   TSH 2.81 04/07/2019   Lab Results  Component Value Date   CHOL 197 04/07/2019   HDL 32.90 (L) 04/07/2019   LDLCALC 145 (H) 04/07/2019   LDLDIRECT 111.5 09/09/2012   TRIG 97.0 04/07/2019   CHOLHDL 6 04/07/2019   Lab Results  Component Value Date   WBC 6.9 04/07/2019   HGB 12.5 04/07/2019  HCT 37.2 04/07/2019   MCV 86.2 04/07/2019   PLT 256.0 04/07/2019   No results found for: IRON, TIBC, FERRITIN  Attestation Statements:   Reviewed by clinician on day of visit: allergies, medications, problem list, medical history, surgical history, family history, social history, and previous encounter notes.   Wilhemena Durie, am acting as Location manager for [employer], FNP-C.  I have reviewed the above documentation for accuracy and completeness, and I agree with  the above. -  Georgianne Fick, FNP

## 2019-04-26 ENCOUNTER — Other Ambulatory Visit (HOSPITAL_COMMUNITY): Payer: Self-pay | Admitting: Psychiatry

## 2019-04-26 DIAGNOSIS — F332 Major depressive disorder, recurrent severe without psychotic features: Secondary | ICD-10-CM

## 2019-04-26 DIAGNOSIS — F411 Generalized anxiety disorder: Secondary | ICD-10-CM

## 2019-05-04 DIAGNOSIS — Z8269 Family history of other diseases of the musculoskeletal system and connective tissue: Secondary | ICD-10-CM | POA: Diagnosis not present

## 2019-05-04 DIAGNOSIS — M791 Myalgia, unspecified site: Secondary | ICD-10-CM | POA: Diagnosis not present

## 2019-05-04 DIAGNOSIS — M2550 Pain in unspecified joint: Secondary | ICD-10-CM | POA: Diagnosis not present

## 2019-05-04 DIAGNOSIS — G894 Chronic pain syndrome: Secondary | ICD-10-CM | POA: Diagnosis not present

## 2019-05-05 ENCOUNTER — Ambulatory Visit (INDEPENDENT_AMBULATORY_CARE_PROVIDER_SITE_OTHER): Payer: Federal, State, Local not specified - PPO | Admitting: Bariatrics

## 2019-05-11 ENCOUNTER — Ambulatory Visit (HOSPITAL_COMMUNITY): Payer: Federal, State, Local not specified - PPO | Admitting: Psychology

## 2019-05-11 ENCOUNTER — Other Ambulatory Visit: Payer: Self-pay

## 2019-05-11 DIAGNOSIS — Z124 Encounter for screening for malignant neoplasm of cervix: Secondary | ICD-10-CM | POA: Diagnosis not present

## 2019-05-11 DIAGNOSIS — Z01419 Encounter for gynecological examination (general) (routine) without abnormal findings: Secondary | ICD-10-CM | POA: Diagnosis not present

## 2019-05-11 DIAGNOSIS — R102 Pelvic and perineal pain: Secondary | ICD-10-CM | POA: Diagnosis not present

## 2019-05-12 ENCOUNTER — Ambulatory Visit (INDEPENDENT_AMBULATORY_CARE_PROVIDER_SITE_OTHER): Payer: Federal, State, Local not specified - PPO | Admitting: Bariatrics

## 2019-05-12 ENCOUNTER — Encounter (INDEPENDENT_AMBULATORY_CARE_PROVIDER_SITE_OTHER): Payer: Self-pay | Admitting: Bariatrics

## 2019-05-12 ENCOUNTER — Other Ambulatory Visit: Payer: Self-pay

## 2019-05-12 VITALS — BP 125/80 | HR 73 | Temp 98.5°F | Ht 63.0 in | Wt 305.0 lb

## 2019-05-12 DIAGNOSIS — E559 Vitamin D deficiency, unspecified: Secondary | ICD-10-CM | POA: Diagnosis not present

## 2019-05-12 DIAGNOSIS — Z9189 Other specified personal risk factors, not elsewhere classified: Secondary | ICD-10-CM

## 2019-05-12 DIAGNOSIS — E7849 Other hyperlipidemia: Secondary | ICD-10-CM

## 2019-05-12 DIAGNOSIS — Z6841 Body Mass Index (BMI) 40.0 and over, adult: Secondary | ICD-10-CM

## 2019-05-12 MED ORDER — VITAMIN D3 1.25 MG (50000 UT) PO CAPS: 1.0000 | ORAL_CAPSULE | ORAL | 0 refills | Status: DC

## 2019-05-12 NOTE — Progress Notes (Signed)
Chief Complaint:   OBESITY [name] is here to discuss her progress with her obesity treatment plan along with follow-up of her obesity related diagnoses. [name] is on the Category 3 Plan and states she is following her eating plan approximately 50% of the time. [name] states she is exercising 0 minutes 0 times per week.  Today's visit was #: 10 Starting weight: 315 lbs Starting date: 10/27/2018 Today's weight: 305 lbs Today's date: 05/12/2019 Total lbs lost to date: 10 Total lbs lost since last in-office visit: 0  Interim History: [name]'s weight remains the same.  Subjective:   Other hyperlipidemia. [name] has hyperlipidemia and has been trying to improve her cholesterol levels with intensive lifestyle modification including a low saturated fat diet, exercise and weight loss. She denies any chest pain, claudication or myalgias. [name] is on no medication.  Lab Results  Component Value Date   ALT 16 03/05/2019   AST 16 03/05/2019   ALKPHOS 105 03/05/2019   BILITOT 0.3 03/05/2019   Lab Results  Component Value Date   CHOL 197 04/07/2019   HDL 32.90 (L) 04/07/2019   LDLCALC 145 (H) 04/07/2019   LDLDIRECT 111.5 09/09/2012   TRIG 97.0 04/07/2019   CHOLHDL 6 04/07/2019   Vitamin D deficiency. No nausea, vomiting, or muscle weakness. Last Vitamin D 39.5 on 03/05/2019.  At risk for osteoporosis. [name] is at higher risk of osteopenia and osteoporosis due to Vitamin D deficiency.   Assessment/Plan:   Other hyperlipidemia. Cardiovascular risk and specific lipid/LDL goals reviewed.  We discussed several lifestyle modifications today and [name] will continue to work on diet, exercise and weight loss efforts. Orders and follow up as documented in patient record. She will decrease saturated fats, avoid trans fats, and increase PUFA's and MUFA's.  Counseling Intensive lifestyle modifications are the first line treatment for this issue. . Dietary changes: Increase  soluble fiber. Decrease simple carbohydrates. . Exercise changes: Moderate to vigorous-intensity aerobic activity 150 minutes per week if tolerated. . Lipid-lowering medications: see documented in medical record.  Vitamin D deficiency. Low Vitamin D level contributes to fatigue and are associated with obesity, breast, and colon cancer. She was given a prescription for Cholecalciferol (VITAMIN D3) 1.25 MG (50000 UT) CAPS every week #4 with 0 refills and will follow-up for routine testing of Vitamin D, at least 2-3 times per year to avoid over-replacement.     At risk for osteoporosis. [name] was given approximately 15 minutes of osteoporosis prevention counseling today. [name] is at risk for osteopenia and osteoporosis due to her Vitamin D deficiency. She was encouraged to take her Vitamin D and follow her higher calcium diet and increase strengthening exercise to help strengthen her bones and decrease her risk of osteopenia and osteoporosis.  Repetitive spaced learning was employed today to elicit superior memory formation and behavioral change.  Class 3 severe obesity with serious comorbidity and body mass index (BMI) of 50.0 to 59.9 in adult, unspecified obesity type ([place]).  [name] is currently in the action stage of change. As such, her goal is to continue with weight loss efforts. She has agreed to the Category 3 Plan.   She will work on meal planning, intentional eating, being more adherent to the plan, and increasing her water intake.  Exercise goals: [name] will start walking 30 minutes 2 times per week.  Behavioral modification strategies: increasing lean protein intake, decreasing simple carbohydrates, increasing vegetables, increasing water intake, decreasing eating out, no skipping meals, meal planning and  cooking strategies, keeping healthy foods in the home and planning for success.  [name] has agreed to follow-up with our clinic in 2 weeks. She was informed of the importance of  frequent follow-up visits to maximize her success with intensive lifestyle modifications for her multiple health conditions.   Objective:   Blood pressure 125/80, pulse 73, temperature 98.5 F (36.9 C), height 5\' 3"  (1.6 m), weight (!) 305 lb (138.3 kg), last menstrual period 04/27/2019, SpO2 97 %, unknown if currently breastfeeding. Body mass index is 54.03 kg/m.  General: Cooperative, alert, well developed, in no acute distress. HEENT: Conjunctivae and lids unremarkable. Cardiovascular: Regular rhythm.  Lungs: Normal work of breathing. Neurologic: No focal deficits.   Lab Results  Component Value Date   CREATININE 0.75 03/05/2019   BUN 14 03/05/2019   NA 142 03/05/2019   K 4.4 03/05/2019   CL 105 03/05/2019   CO2 23 03/05/2019   Lab Results  Component Value Date   ALT 16 03/05/2019   AST 16 03/05/2019   ALKPHOS 105 03/05/2019   BILITOT 0.3 03/05/2019   Lab Results  Component Value Date   HGBA1C 5.6 03/05/2019   HGBA1C 6.1 09/29/2018   HGBA1C 5.9 08/14/2017   HGBA1C 5.8 03/13/2016   HGBA1C 5.5 05/16/2015   Lab Results  Component Value Date   INSULIN 14.9 03/05/2019   INSULIN 15.4 10/27/2018   Lab Results  Component Value Date   TSH 2.81 04/07/2019   Lab Results  Component Value Date   CHOL 197 04/07/2019   HDL 32.90 (L) 04/07/2019   LDLCALC 145 (H) 04/07/2019   LDLDIRECT 111.5 09/09/2012   TRIG 97.0 04/07/2019   CHOLHDL 6 04/07/2019   Lab Results  Component Value Date   WBC 6.9 04/07/2019   HGB 12.5 04/07/2019   HCT 37.2 04/07/2019   MCV 86.2 04/07/2019   PLT 256.0 04/07/2019   No results found for: IRON, TIBC, FERRITIN  Attestation Statements:   Reviewed by clinician on day of visit: allergies, medications, problem list, medical history, surgical history, family history, social history, and previous encounter notes.  Migdalia Dk, am acting as Location manager for [employer], DO   I have reviewed the above documentation for accuracy and  completeness, and I agree with the above. Jearld Lesch, DO

## 2019-05-14 ENCOUNTER — Other Ambulatory Visit: Payer: Self-pay

## 2019-05-14 ENCOUNTER — Telehealth (INDEPENDENT_AMBULATORY_CARE_PROVIDER_SITE_OTHER): Payer: Federal, State, Local not specified - PPO | Admitting: Psychiatry

## 2019-05-14 ENCOUNTER — Encounter (HOSPITAL_COMMUNITY): Payer: Self-pay | Admitting: Psychiatry

## 2019-05-14 DIAGNOSIS — F332 Major depressive disorder, recurrent severe without psychotic features: Secondary | ICD-10-CM

## 2019-05-14 DIAGNOSIS — F17210 Nicotine dependence, cigarettes, uncomplicated: Secondary | ICD-10-CM | POA: Diagnosis not present

## 2019-05-14 DIAGNOSIS — F411 Generalized anxiety disorder: Secondary | ICD-10-CM | POA: Diagnosis not present

## 2019-05-14 MED ORDER — BUSPIRONE HCL 10 MG PO TABS: 10.0000 mg | ORAL_TABLET | Freq: Three times a day (TID) | ORAL | 0 refills | Status: DC

## 2019-05-14 MED ORDER — FLUOXETINE HCL 20 MG PO CAPS: ORAL_CAPSULE | ORAL | 0 refills | Status: DC

## 2019-05-14 NOTE — BH Specialist Note (Signed)
Virtual Visit via Telephone Note  I connected with [name] on 05/14/19 at  9:30 AM EDT by telephone and verified that I am speaking with the correct person using two identifiers.  Location: Patient: home Provider: office   I discussed the limitations, risks, security and privacy concerns of performing an evaluation and management service by telephone and the availability of in person appointments. I also discussed with the patient that there may be a patient responsible charge related to this service. The patient expressed understanding and agreed to proceed.   History of Present Illness: [name] shares that she is doing ok. She is busy with work, family and other day to day activities. The family has a couple of day trips this summer. She is feeling better energy and "life in general". Her energy is not as good as it should be but it is improving. She is more motivated. Her depression has not been noticeable lately. She denies anhedonia and irritability. She denies SI/HI. Sleep is good. Anxiety is very mild and she is handling stressors much better. Most days she is able to get at least 2 doses of Buspar a day. She has trouble taking the PM dose but has some ideas how to improve. She has no concerns at this time. She is smoking a little more- about 6-7 cigs/day   Observations/Objective:  General Appearance: unable to assess  Eye Contact:  unable to assess  Speech:  Clear and Coherent and Normal Rate  Volume:  Normal  Mood:  Euthymic  Affect:  Full Range  Thought Process:  Goal Directed, Linear and Descriptions of Associations: Intact  Orientation:  Full (Time, Place, and Person)  Thought Content:  Logical  Suicidal Thoughts:  No  Homicidal Thoughts:  No  Memory:  Immediate;   Good  Judgement:  Good  Insight:  Good  Psychomotor Activity: unable to assess  Concentration:  Concentration: Good  Recall:  Good  Fund of Knowledge:  Good  Language:  Good  Akathisia:  unable to  assess  Handed:  Right  AIMS (if indicated):     Assets:  Communication Skills Desire for Improvement Financial Resources/Insurance Housing Intimacy Resilience Social Support Talents/Skills Transportation Vocational/Educational  ADL's:  unable to assess  Cognition:  WNL  Sleep:          I reviewed the information below on 05/14/19 and have updated it Assessment and Plan:  MDD-recurrent, severe without psychotic features; GAD; Social anxiety disorder; Nicotine use do   Status of current symptoms: stable   Buspar 10mg  po TID for GAD and MDD   Prozac 60mg  po qD for MDD, GAD and social anxiety   Discussed smoking cessation in detail- I recommended she call 1-800-quitline when she is ready to quit. Wellbutrin was ineffective.  - working with therapist  Follow Up Instructions: In 2-3 months or sooner if needed   I discussed the assessment and treatment plan with the patient. The patient was provided an opportunity to ask questions and all were answered. The patient agreed with the plan and demonstrated an understanding of the instructions.   The patient was advised to call back or seek an in-person evaluation if the symptoms worsen or if the condition fails to improve as anticipated.  I provided 20 minutes of non-face-to-face time during this encounter.   Charlcie Cradle, MD

## 2019-05-21 DIAGNOSIS — G4733 Obstructive sleep apnea (adult) (pediatric): Secondary | ICD-10-CM | POA: Diagnosis not present

## 2019-05-28 ENCOUNTER — Ambulatory Visit (INDEPENDENT_AMBULATORY_CARE_PROVIDER_SITE_OTHER): Payer: Federal, State, Local not specified - PPO | Admitting: Psychology

## 2019-05-28 ENCOUNTER — Other Ambulatory Visit: Payer: Self-pay

## 2019-05-28 DIAGNOSIS — F330 Major depressive disorder, recurrent, mild: Secondary | ICD-10-CM | POA: Diagnosis not present

## 2019-05-28 DIAGNOSIS — F411 Generalized anxiety disorder: Secondary | ICD-10-CM

## 2019-05-28 NOTE — Progress Notes (Signed)
Virtual Visit via Video Note  I connected with [name] on 05/28/19 at 10:00 AM EDT by a video enabled telemedicine application and verified that I am speaking with the correct person using two identifiers.   I discussed the limitations of evaluation and management by telemedicine and the availability of in person appointments. The patient expressed understanding and agreed to proceed.     I discussed the assessment and treatment plan with the patient. The patient was provided an opportunity to ask questions and all were answered. The patient agreed with the plan and demonstrated an understanding of the instructions.   The patient was advised to call back or seek an in-person evaluation if the symptoms worsen or if the condition fails to improve as anticipated.  I provided 45 minutes of non-face-to-face time during this encounter.   [name] [facility]    THERAPIST PROGRESS NOTE  Session Time: 10.05am-10.50am  Participation Level: Active  Behavioral Response: Well GroomedAlertaffect wnl  Type of Therapy: Individual Therapy  Treatment Goals addressed: Diagnosis: GAD, MDD and goal 1.  Interventions: CBT and Strength-based  Summary: [name] is a 39 y.o. female who presents with affect wnl.  Pt reported that a lot of stressors going on recent.  They bought a new/used car as other car engine going.  Pt reported that working out w/ her recent raise and other financial changes.  Pt reported that they ocntinue to make progress w/ credit and this is good to see.  Pt reported that her mother in law had a heart attack was in the hospital for a week and now has more appointments, meds and some home therapy starting. Pt reported that positives of her son being accepted into preschool that wanted and will start in the fall and he continues w/ his tx.  Pt reported she is doing well taking care of self and taking approach of day by day. Pt discussed choice to continue w/ current  therapist at her new practice.     Suicidal/Homicidal: Nowithout intent/plan  Therapist Response: Assessed pt current functioning per pt report. Processed w/pt stressors, impact playing and how she is managing through w/ strengths, coping skills and acceptance.  Discussed plan of tx and pt options for continued care.    Plan: Return again in 4 weeks.  Pt choice to continue w/ Counselor at [facility] and pt will call to set up.  F/u w/ Dr. Doyne Keel as scheduled.  Diagnosis:    GAD, MDD   [name], [facility] 05/28/2019

## 2019-06-02 ENCOUNTER — Ambulatory Visit (INDEPENDENT_AMBULATORY_CARE_PROVIDER_SITE_OTHER): Payer: Federal, State, Local not specified - PPO | Admitting: Bariatrics

## 2019-06-02 ENCOUNTER — Encounter (INDEPENDENT_AMBULATORY_CARE_PROVIDER_SITE_OTHER): Payer: Self-pay | Admitting: Bariatrics

## 2019-06-02 ENCOUNTER — Other Ambulatory Visit: Payer: Self-pay

## 2019-06-02 VITALS — BP 120/75 | HR 79 | Temp 98.4°F | Ht 63.0 in | Wt 303.0 lb

## 2019-06-02 DIAGNOSIS — R7303 Prediabetes: Secondary | ICD-10-CM | POA: Diagnosis not present

## 2019-06-02 DIAGNOSIS — Z6841 Body Mass Index (BMI) 40.0 and over, adult: Secondary | ICD-10-CM

## 2019-06-02 DIAGNOSIS — E559 Vitamin D deficiency, unspecified: Secondary | ICD-10-CM

## 2019-06-02 NOTE — Progress Notes (Signed)
Chief Complaint:   OBESITY [name] is here to discuss her progress with her obesity treatment plan along with follow-up of her obesity related diagnoses. [name] is on the Category 3 Plan and states she is following her eating plan approximately 70% of the time. [name] states she is increasing her activities.   Today's visit was #: 11 Starting weight: 315 lbs Starting date: 10/27/2018 Today's weight: 303 lbs Today's date: 06/02/2019 Total lbs lost to date: 12 Total lbs lost since last in-office visit: 2  Interim History: [name] is down 2 lbs and is doing well overall.  Subjective:   Prediabetes. [name] has a diagnosis of prediabetes based on her elevated HgA1c and was informed this puts her at greater risk of developing diabetes. She continues to work on diet and exercise to decrease her risk of diabetes. She denies nausea or hypoglycemia.  Lab Results  Component Value Date   HGBA1C 5.6 03/05/2019   Lab Results  Component Value Date   INSULIN 14.9 03/05/2019   INSULIN 15.4 10/27/2018   Vitamin D deficiency. [name] is taking Vitamin D supplementation. Last Vitamin D 39.5 on 03/05/2019.  Assessment/Plan:   Prediabetes. [name] will continue to work on weight loss, exercise, increasing healthy fats and protein, and decreasing simple carbohydrates to help decrease the risk of diabetes. Hemoglobin A1c, Insulin, random labs ordered today.  Vitamin D deficiency. Low Vitamin D level contributes to fatigue and are associated with obesity, breast, and colon cancer. She agrees to continue to take Vitamin D and VITAMIN D 25 Hydroxy (Vit-D Deficiency, Fractures) level ordered today.  Class 3 severe obesity with serious comorbidity and body mass index (BMI) of 50.0 to 59.9 in adult, unspecified obesity type ([place]).  She will work on meal planning and intentional eating.   [name] is currently in the action stage of change. As such, her goal is to continue with weight  loss efforts. She has agreed to the Category 3 Plan.   Exercise goals: All adults should avoid inactivity. Some physical activity is better than none, and adults who participate in any amount of physical activity gain some health benefits.  Behavioral modification strategies: increasing lean protein intake, decreasing simple carbohydrates, increasing vegetables, increasing water intake, decreasing eating out, no skipping meals, meal planning and cooking strategies, keeping healthy foods in the home and planning for success.  [name] has agreed to follow-up with our clinic in 2 weeks. She was informed of the importance of frequent follow-up visits to maximize her success with intensive lifestyle modifications for her multiple health conditions.   [name] was informed we would discuss her lab results at her next visit unless there is a critical issue that needs to be addressed sooner. [name] agreed to keep her next visit at the agreed upon time to discuss these results.  Objective:   Blood pressure 120/75, pulse 79, temperature 98.4 F (36.9 C), height 5\' 3"  (1.6 m), weight (!) 303 lb (137.4 kg), SpO2 96 %, unknown if currently breastfeeding. Body mass index is 53.67 kg/m.  General: Cooperative, alert, well developed, in no acute distress. HEENT: Conjunctivae and lids unremarkable. Cardiovascular: Regular rhythm.  Lungs: Normal work of breathing. Neurologic: No focal deficits.   Lab Results  Component Value Date   CREATININE 0.75 03/05/2019   BUN 14 03/05/2019   NA 142 03/05/2019   K 4.4 03/05/2019   CL 105 03/05/2019   CO2 23 03/05/2019   Lab Results  Component Value Date   ALT 16  03/05/2019   AST 16 03/05/2019   ALKPHOS 105 03/05/2019   BILITOT 0.3 03/05/2019   Lab Results  Component Value Date   HGBA1C 5.6 03/05/2019   HGBA1C 6.1 09/29/2018   HGBA1C 5.9 08/14/2017   HGBA1C 5.8 03/13/2016   HGBA1C 5.5 05/16/2015   Lab Results  Component Value Date   INSULIN 14.9  03/05/2019   INSULIN 15.4 10/27/2018   Lab Results  Component Value Date   TSH 2.81 04/07/2019   Lab Results  Component Value Date   CHOL 197 04/07/2019   HDL 32.90 (L) 04/07/2019   LDLCALC 145 (H) 04/07/2019   LDLDIRECT 111.5 09/09/2012   TRIG 97.0 04/07/2019   CHOLHDL 6 04/07/2019   Lab Results  Component Value Date   WBC 6.9 04/07/2019   HGB 12.5 04/07/2019   HCT 37.2 04/07/2019   MCV 86.2 04/07/2019   PLT 256.0 04/07/2019   No results found for: IRON, TIBC, FERRITIN  Attestation Statements:   Reviewed by clinician on day of visit: allergies, medications, problem list, medical history, surgical history, family history, social history, and previous encounter notes.  Migdalia Dk, am acting as Location manager for [employer], DO   I have reviewed the above documentation for accuracy and completeness, and I agree with the above. Jearld Lesch, DO

## 2019-06-03 ENCOUNTER — Telehealth: Payer: Self-pay | Admitting: Family

## 2019-06-03 LAB — INSULIN, RANDOM: INSULIN: 19 u[IU]/mL (ref 2.6–24.9)

## 2019-06-03 LAB — HEMOGLOBIN A1C
Est. average glucose Bld gHb Est-mCnc: 114 mg/dL
Hgb A1c MFr Bld: 5.6 % (ref 4.8–5.6)

## 2019-06-03 LAB — VITAMIN D 25 HYDROXY (VIT D DEFICIENCY, FRACTURES): Vit D, 25-Hydroxy: 53.7 ng/mL (ref 30.0–100.0)

## 2019-06-03 MED ORDER — AMOXICILLIN 875 MG PO TABS: 875.0000 mg | ORAL_TABLET | Freq: Two times a day (BID) | ORAL | 0 refills | Status: DC

## 2019-06-03 NOTE — Telephone Encounter (Signed)
New Message:   Pt is calling and states both of her children have Strep throat and she is starting to feel the same way they do. She would like to know if something can be called in for her. Please advise.

## 2019-06-03 NOTE — Telephone Encounter (Signed)
I will go ahead and call in Amoxicillin for her since we are not allowed to do Strep test at this time. She should change her toothbrush after 24 hours and again when she finishes her antibiotics. If the symptoms persist, she will need to be seen.

## 2019-06-03 NOTE — Telephone Encounter (Signed)
Spoke with patient and info given 

## 2019-06-05 ENCOUNTER — Other Ambulatory Visit (INDEPENDENT_AMBULATORY_CARE_PROVIDER_SITE_OTHER): Payer: Self-pay | Admitting: Bariatrics

## 2019-06-05 DIAGNOSIS — E559 Vitamin D deficiency, unspecified: Secondary | ICD-10-CM

## 2019-06-30 ENCOUNTER — Ambulatory Visit (INDEPENDENT_AMBULATORY_CARE_PROVIDER_SITE_OTHER): Payer: Federal, State, Local not specified - PPO | Admitting: Bariatrics

## 2019-06-30 ENCOUNTER — Encounter (INDEPENDENT_AMBULATORY_CARE_PROVIDER_SITE_OTHER): Payer: Self-pay

## 2019-07-07 DIAGNOSIS — R102 Pelvic and perineal pain: Secondary | ICD-10-CM | POA: Diagnosis not present

## 2019-07-14 DIAGNOSIS — N939 Abnormal uterine and vaginal bleeding, unspecified: Secondary | ICD-10-CM | POA: Diagnosis not present

## 2019-08-20 ENCOUNTER — Other Ambulatory Visit: Payer: Self-pay

## 2019-08-20 ENCOUNTER — Telehealth (INDEPENDENT_AMBULATORY_CARE_PROVIDER_SITE_OTHER): Payer: Federal, State, Local not specified - PPO | Admitting: Psychiatry

## 2019-08-20 DIAGNOSIS — F332 Major depressive disorder, recurrent severe without psychotic features: Secondary | ICD-10-CM

## 2019-08-20 DIAGNOSIS — G4733 Obstructive sleep apnea (adult) (pediatric): Secondary | ICD-10-CM | POA: Diagnosis not present

## 2019-08-20 DIAGNOSIS — F411 Generalized anxiety disorder: Secondary | ICD-10-CM

## 2019-08-20 DIAGNOSIS — F17210 Nicotine dependence, cigarettes, uncomplicated: Secondary | ICD-10-CM

## 2019-08-20 MED ORDER — BUSPIRONE HCL 10 MG PO TABS: 10.0000 mg | ORAL_TABLET | Freq: Three times a day (TID) | ORAL | 0 refills | Status: DC

## 2019-08-20 MED ORDER — FLUOXETINE HCL 20 MG PO CAPS: ORAL_CAPSULE | ORAL | 0 refills | Status: DC

## 2019-08-20 NOTE — Progress Notes (Signed)
Virtual Visit via Video Note  I connected with [name] on 08/20/19 at  9:30 AM EDT by a video enabled telemedicine application and verified that I am speaking with the correct person using two identifiers.  Location: Patient: home Provider: office   I discussed the limitations of evaluation and management by telemedicine and the availability of in person appointments. The patient expressed understanding and agreed to proceed.  History of Present Illness: "I am doing ok". Her son is starting school in a couple of weeks which means she will have some alone time that she is looking forward to. Her depression is stable. She does not think it is overwhelming. [name] has been more motivated and is doing more around the house. It makes her feel good. Her main concern is her anxiety. She often forgets to take her Buspar nighttime dose. Despite having an alarm she gets distracted and forgets. When she doesn't take it she is more anxious, nightmares. [name] notices a marked difference when she takes it as prescribed. She denies SIB/SI/HI. [name] admits that she has been smoking more. Her husband is also smoking with her. They plan to quit together at some point in the future.    Observations/Objective: Psychiatric Specialty Exam: unknown if currently breastfeeding.There is no height or weight on file to calculate BMI.  General Appearance: Casual  Eye Contact:  Good  Speech:  Clear and Coherent and Normal Rate  Volume:  Normal  Mood:  Anxious  Affect:  Full Range  Thought Process:  Goal Directed and Descriptions of Associations: Intact  Orientation:  Full (Time, Place, and Person)  Thought Content:  Logical  Suicidal Thoughts:  No  Homicidal Thoughts:  No  Memory:  Immediate;   Good  Judgement:  Good  Insight:  Good  Psychomotor Activity:  Normal  Concentration:  Concentration: Good  Recall:  Good  Fund of Knowledge:  Good  Language:  Good  Akathisia:  No  Handed:  Right  AIMS  (if indicated):     Assets:  Communication Skills Desire for Improvement Financial Resources/Insurance Housing Intimacy Leisure Time Resilience Social Support Talents/Skills Transportation Vocational/Educational  ADL's:  Intact  Cognition:  WNL  Sleep:        I reviewed the information below on 08/20/19 and have updated it. Assessment and Plan:  MDD-recurrent, severe without psychotic features; GAD; Social anxiety disorder; Nicotine use do   Status of current symptoms: ongoing anxiety   Buspar 10mg  po TID for GAD and MDD. Stratigized ways to improve compliance.    Prozac 60mg  po qD for MDD, GAD and social anxiety   Discussed smoking cessation in detail- I recommended she call 1-800-quitline when she is ready to quit. Wellbutrin was ineffective.   - working with therapist    Follow Up Instructions: In 2-3 months or sooner if needed   I discussed the assessment and treatment plan with the patient. The patient was provided an opportunity to ask questions and all were answered. The patient agreed with the plan and demonstrated an understanding of the instructions.   The patient was advised to call back or seek an in-person evaluation if the symptoms worsen or if the condition fails to improve as anticipated.  I provided 15 minutes of non-face-to-face time during this encounter.   Charlcie Cradle, MD

## 2019-09-25 DIAGNOSIS — Z20822 Contact with and (suspected) exposure to covid-19: Secondary | ICD-10-CM | POA: Diagnosis not present

## 2019-10-02 DIAGNOSIS — Z20822 Contact with and (suspected) exposure to covid-19: Secondary | ICD-10-CM | POA: Diagnosis not present

## 2019-11-10 DIAGNOSIS — M2550 Pain in unspecified joint: Secondary | ICD-10-CM | POA: Diagnosis not present

## 2019-11-10 DIAGNOSIS — E559 Vitamin D deficiency, unspecified: Secondary | ICD-10-CM | POA: Diagnosis not present

## 2019-11-10 DIAGNOSIS — G894 Chronic pain syndrome: Secondary | ICD-10-CM | POA: Diagnosis not present

## 2019-11-10 DIAGNOSIS — M150 Primary generalized (osteo)arthritis: Secondary | ICD-10-CM | POA: Diagnosis not present

## 2019-11-10 DIAGNOSIS — Z79899 Other long term (current) drug therapy: Secondary | ICD-10-CM | POA: Diagnosis not present

## 2019-11-10 DIAGNOSIS — M791 Myalgia, unspecified site: Secondary | ICD-10-CM | POA: Diagnosis not present

## 2019-11-12 ENCOUNTER — Telehealth (INDEPENDENT_AMBULATORY_CARE_PROVIDER_SITE_OTHER): Payer: Federal, State, Local not specified - PPO | Admitting: Psychiatry

## 2019-11-12 ENCOUNTER — Other Ambulatory Visit: Payer: Self-pay

## 2019-11-12 DIAGNOSIS — F411 Generalized anxiety disorder: Secondary | ICD-10-CM | POA: Diagnosis not present

## 2019-11-12 DIAGNOSIS — F332 Major depressive disorder, recurrent severe without psychotic features: Secondary | ICD-10-CM

## 2019-11-12 MED ORDER — BUSPIRONE HCL 10 MG PO TABS: 10.0000 mg | ORAL_TABLET | Freq: Three times a day (TID) | ORAL | 0 refills | Status: DC

## 2019-11-12 MED ORDER — FLUOXETINE HCL 20 MG PO CAPS: ORAL_CAPSULE | ORAL | 0 refills | Status: DC

## 2019-11-12 NOTE — Progress Notes (Signed)
Virtual Visit via Telephone Note  I connected with [name] on 11/12/19 at  8:30 AM EDT by telephone and verified that I am speaking with the correct person using two identifiers.  Location: Patient: home Provider: office   I discussed the limitations, risks, security and privacy concerns of performing an evaluation and management service by telephone and the availability of in person appointments. I also discussed with the patient that there may be a patient responsible charge related to this service. The patient expressed understanding and agreed to proceed.   History of Present Illness: [name] was forced to move. It was stressful. The rent is higher so the financial strain is causing anxiety. She is struggling with meds vs wants. She admits she is missing her morning dose of meds and will end up taking it in the late morning. This causes her to skip her afternoon dose. She is not sleeping as well on nights she misses meds. [name] denies depression. She denies SI/HI. Work is going well and she is waiting on her bonus to buy Christmas presents. Overall she feels her meds are working well.    Observations/Objective:  General Appearance: unable to assess  Eye Contact:  unable to assess  Speech:  Clear and Coherent and Normal Rate  Volume:  Normal  Mood:  Euthymic  Affect:  Full Range  Thought Process:  Goal Directed, Linear and Descriptions of Associations: Intact  Orientation:  Full (Time, Place, and Person)  Thought Content:  Logical  Suicidal Thoughts:  No  Homicidal Thoughts:  No  Memory:  Immediate;   Good  Judgement:  Good  Insight:  Good  Psychomotor Activity: unable to assess  Concentration:  Concentration: Good  Recall:  Good  Fund of Knowledge:  Good  Language:  Good  Akathisia:  unable to assess  Handed:  Right  AIMS (if indicated):     Assets:  Communication Skills Desire for Improvement Financial Resources/Insurance Housing Intimacy Leisure  Time Resilience Social Support Talents/Skills Transportation Vocational/Educational  ADL's:  unable to assess  Cognition:  WNL  Sleep:         Assessment and Plan:  1. GAD (generalized anxiety disorder) - busPIRone (BUSPAR) 10 MG tablet; Take 1 tablet (10 mg total) by mouth 3 (three) times daily. For mood control  Dispense: 270 tablet; Refill: 0 - FLUoxetine (PROZAC) 20 MG capsule; TAKE 3 CAPSULES BY MOUTH DAILY FOR MOOD CONTROL  Dispense: 270 capsule; Refill: 0  2. Severe episode of recurrent major depressive disorder, without psychotic features (HCC) - FLUoxetine (PROZAC) 20 MG capsule; TAKE 3 CAPSULES BY MOUTH DAILY FOR MOOD CONTROL  Dispense: 270 capsule; Refill: 0     Follow Up Instructions: In 3 months or sooner if needed   I discussed the assessment and treatment plan with the patient. The patient was provided an opportunity to ask questions and all were answered. The patient agreed with the plan and demonstrated an understanding of the instructions.   The patient was advised to call back or seek an in-person evaluation if the symptoms worsen or if the condition fails to improve as anticipated.  I provided 15 minutes of non-face-to-face time during this encounter.   Charlcie Cradle, MD

## 2019-11-18 DIAGNOSIS — G4733 Obstructive sleep apnea (adult) (pediatric): Secondary | ICD-10-CM | POA: Diagnosis not present

## 2020-02-11 ENCOUNTER — Telehealth (INDEPENDENT_AMBULATORY_CARE_PROVIDER_SITE_OTHER): Payer: Federal, State, Local not specified - PPO | Admitting: Psychiatry

## 2020-02-11 ENCOUNTER — Other Ambulatory Visit: Payer: Self-pay

## 2020-02-11 DIAGNOSIS — F332 Major depressive disorder, recurrent severe without psychotic features: Secondary | ICD-10-CM | POA: Diagnosis not present

## 2020-02-11 DIAGNOSIS — F411 Generalized anxiety disorder: Secondary | ICD-10-CM | POA: Diagnosis not present

## 2020-02-11 MED ORDER — FLUOXETINE HCL 20 MG PO CAPS: ORAL_CAPSULE | ORAL | 0 refills | Status: DC

## 2020-02-11 MED ORDER — BUSPIRONE HCL 10 MG PO TABS: 10.0000 mg | ORAL_TABLET | Freq: Three times a day (TID) | ORAL | 0 refills | Status: DC

## 2020-02-11 NOTE — Progress Notes (Signed)
Virtual Visit via Telephone Note  I connected with [name] on 02/11/20 at  8:30 AM EST by telephone and verified that I am speaking with the correct person using two identifiers.  Location: Patient: home Provider: office   I discussed the limitations, risks, security and privacy concerns of performing an evaluation and management service by telephone and the availability of in person appointments. I also discussed with the patient that there may be a patient responsible charge related to this service. The patient expressed understanding and agreed to proceed.   History of Present Illness: [name] shares that she is stressed. They are having financial stressors and need to move. With money being tight she is anxious. Her sleep is disturbed. Her depression is manageable. She denies SI/HI.    Observations/Objective:  General Appearance: unable to assess  Eye Contact:  unable to assess  Speech:  Clear and Coherent and Normal Rate  Volume:  Normal  Mood:  Anxious  Affect:  Full Range  Thought Process:  Goal Directed, Linear and Descriptions of Associations: Intact  Orientation:  Full (Time, Place, and Person)  Thought Content:  Logical  Suicidal Thoughts:  No  Homicidal Thoughts:  No  Memory:  Immediate;   Good  Judgement:  Good  Insight:  Good  Psychomotor Activity: unable to assess  Concentration:  Concentration: Good  Recall:  Good  Fund of Knowledge:  Good  Language:  Good  Akathisia:  unable to assess  Handed:  Right  AIMS (if indicated):     Assets:  Communication Skills Desire for Improvement Financial Resources/Insurance Housing Resilience Social Support Talents/Skills Transportation Vocational/Educational  ADL's:  unable to assess  Cognition:  WNL  Sleep:         Assessment and Plan: 1. GAD (generalized anxiety disorder) - busPIRone (BUSPAR) 10 MG tablet; Take 1 tablet (10 mg total) by mouth 3 (three) times daily. For mood control  Dispense: 270  tablet; Refill: 0 - FLUoxetine (PROZAC) 20 MG capsule; TAKE 3 CAPSULES BY MOUTH DAILY FOR MOOD CONTROL  Dispense: 270 capsule; Refill: 0  2. Severe episode of recurrent major depressive disorder, without psychotic features (HCC) - FLUoxetine (PROZAC) 20 MG capsule; TAKE 3 CAPSULES BY MOUTH DAILY FOR MOOD CONTROL  Dispense: 270 capsule; Refill: 0    Follow Up Instructions: In 3 months or sooner if needed   I discussed the assessment and treatment plan with the patient. The patient was provided an opportunity to ask questions and all were answered. The patient agreed with the plan and demonstrated an understanding of the instructions.   The patient was advised to call back or seek an in-person evaluation if the symptoms worsen or if the condition fails to improve as anticipated.  I provided 10 minutes of non-face-to-face time during this encounter.   Charlcie Cradle, MD

## 2020-02-17 DIAGNOSIS — G4733 Obstructive sleep apnea (adult) (pediatric): Secondary | ICD-10-CM | POA: Diagnosis not present

## 2020-04-11 ENCOUNTER — Encounter: Payer: Federal, State, Local not specified - PPO | Admitting: Family

## 2020-04-27 ENCOUNTER — Encounter: Payer: Federal, State, Local not specified - PPO | Admitting: Family

## 2020-05-05 ENCOUNTER — Telehealth (INDEPENDENT_AMBULATORY_CARE_PROVIDER_SITE_OTHER): Payer: Federal, State, Local not specified - PPO | Admitting: Psychiatry

## 2020-05-05 ENCOUNTER — Other Ambulatory Visit: Payer: Self-pay

## 2020-05-05 DIAGNOSIS — F411 Generalized anxiety disorder: Secondary | ICD-10-CM

## 2020-05-05 DIAGNOSIS — F332 Major depressive disorder, recurrent severe without psychotic features: Secondary | ICD-10-CM

## 2020-05-05 MED ORDER — FLUOXETINE HCL 20 MG PO CAPS: ORAL_CAPSULE | ORAL | 1 refills | Status: DC

## 2020-05-05 MED ORDER — BUSPIRONE HCL 10 MG PO TABS: 10.0000 mg | ORAL_TABLET | Freq: Three times a day (TID) | ORAL | 1 refills | Status: DC

## 2020-05-05 NOTE — Progress Notes (Addendum)
Virtual Visit via Video Note  I connected with [name] on 05/05/20 at  9:00 AM EDT by a video enabled telemedicine application and verified that I am speaking with the correct person using two identifiers.  Location: Patient: home Provider: office   I discussed the limitations of evaluation and management by telemedicine and the availability of in person appointments. The patient expressed understanding and agreed to proceed.  History of Present Illness: "Things are good right now. I'm good". She was able to pay off a loan and she got rid of a car that wasn't working. This has resulted in better finances. Her stress level has improved. She is working extra for credit hours. [name] needs more help around the house but is frustrated because she is not getting much. [name] is working on getting her house together. It is overwhelming. Her goal is to start on her hobby of art once her house is set. Her social anxiety is a little worse especially around one of her son's Biomedical scientist. Other than that her general anxiety is mild and management. Her depression is mild. Over the last 14 days she has been depressed only about 2 days and didn't feel like doing anything. Her sleep, appetite and energy are good. She denies SI/HI.    Observations/Objective:  This was a video visit- the correct MSE is below  Psychiatric Specialty Exam: ROS  unknown if currently breastfeeding.There is no height or weight on file to calculate BMI.  General Appearance: Casual, Fairly Groomed and Neat  Eye Contact:  Good  Speech:  Clear and Coherent and Normal Rate  Volume:  Normal  Mood:  Euthymic  Affect:  Full Range  Thought Process:  Goal Directed, Linear and Descriptions of Associations: Intact  Orientation:  Full (Time, Place, and Person)  Thought Content:  Logical  Suicidal Thoughts:  No  Homicidal Thoughts:  No  Memory:  Immediate;   Good  Judgement:  Good  Insight:  Good  Psychomotor  Activity:  Normal  Concentration:  Concentration: Good  Recall:  Good  Fund of Knowledge:  Good  Language:  Good  Akathisia:  No  Handed:  Right  AIMS (if indicated):     Assets:  Communication Skills Desire for Improvement Financial Resources/Insurance Housing Intimacy Leisure Time Resilience Social Support Talents/Skills Transportation Vocational/Educational  ADL's:  Intact  Cognition:  WNL  Sleep:          Assessment and Plan: Depression screen [facility] 2/9 05/05/2020 04/07/2019 10/27/2018 02/09/2015 10/19/2014  Decreased Interest 0 0 3 1 0  Down, Depressed, Hopeless 1 0 2 1 1   PHQ - 2 Score 1 0 5 2 1   Altered sleeping 0 0 3 0 -  Tired, decreased energy 0 1 3 2  -  Change in appetite 0 0 2 0 -  Feeling bad or failure about yourself  0 0 3 1 -  Trouble concentrating 0 0 1 0 -  Moving slowly or fidgety/restless 0 0 3 0 -  Suicidal thoughts 0 0 1 - -  PHQ-9 Score 1 1 21 5  -  Difficult doing work/chores Not difficult at all Not difficult at all Very difficult Not difficult at all -   Flowsheet Row Video Visit from 05/05/2020 in [facility] Admission (Discharged) from 09/16/2017 in [facility] ED from 09/15/2017 in [place]  C-SSRS RISK CATEGORY No Risk High Risk High Risk     She has had  better compliance.   1. GAD (generalized anxiety disorder) - busPIRone (BUSPAR) 10 MG tablet; Take 1 tablet (10 mg total) by mouth 3 (three) times daily. For mood control  Dispense: 270 tablet; Refill: 1 - FLUoxetine (PROZAC) 20 MG capsule; TAKE 3 CAPSULES BY MOUTH DAILY FOR MOOD CONTROL  Dispense: 270 capsule; Refill: 1  2. Severe episode of recurrent major depressive disorder, without psychotic features (HCC) - FLUoxetine (PROZAC) 20 MG capsule; TAKE 3 CAPSULES BY MOUTH DAILY FOR MOOD CONTROL  Dispense: 270 capsule; Refill: 1    Follow Up Instructions: In 4-5 months or sooner if  needed   I discussed the assessment and treatment plan with the patient. The patient was provided an opportunity to ask questions and all were answered. The patient agreed with the plan and demonstrated an understanding of the instructions.   The patient was advised to call back or seek an in-person evaluation if the symptoms worsen or if the condition fails to improve as anticipated.    Charlcie Cradle, MD

## 2020-05-17 DIAGNOSIS — G4733 Obstructive sleep apnea (adult) (pediatric): Secondary | ICD-10-CM | POA: Diagnosis not present

## 2020-08-15 DIAGNOSIS — G4733 Obstructive sleep apnea (adult) (pediatric): Secondary | ICD-10-CM | POA: Diagnosis not present

## 2020-09-20 ENCOUNTER — Encounter: Payer: Federal, State, Local not specified - PPO | Admitting: Internal Medicine

## 2020-09-22 ENCOUNTER — Ambulatory Visit (INDEPENDENT_AMBULATORY_CARE_PROVIDER_SITE_OTHER): Payer: Federal, State, Local not specified - PPO

## 2020-09-22 ENCOUNTER — Other Ambulatory Visit: Payer: Self-pay

## 2020-09-22 ENCOUNTER — Ambulatory Visit (INDEPENDENT_AMBULATORY_CARE_PROVIDER_SITE_OTHER): Payer: Federal, State, Local not specified - PPO | Admitting: Nurse Practitioner

## 2020-09-22 VITALS — BP 118/80 | HR 66 | Temp 98.4°F | Ht 63.0 in | Wt 317.0 lb

## 2020-09-22 DIAGNOSIS — M1990 Unspecified osteoarthritis, unspecified site: Secondary | ICD-10-CM | POA: Diagnosis not present

## 2020-09-22 DIAGNOSIS — R201 Hypoesthesia of skin: Secondary | ICD-10-CM | POA: Diagnosis not present

## 2020-09-22 DIAGNOSIS — R519 Headache, unspecified: Secondary | ICD-10-CM

## 2020-09-22 DIAGNOSIS — Z0001 Encounter for general adult medical examination with abnormal findings: Secondary | ICD-10-CM

## 2020-09-22 DIAGNOSIS — M5441 Lumbago with sciatica, right side: Secondary | ICD-10-CM

## 2020-09-22 DIAGNOSIS — E559 Vitamin D deficiency, unspecified: Secondary | ICD-10-CM

## 2020-09-22 DIAGNOSIS — R7303 Prediabetes: Secondary | ICD-10-CM

## 2020-09-22 DIAGNOSIS — Z23 Encounter for immunization: Secondary | ICD-10-CM | POA: Diagnosis not present

## 2020-09-22 DIAGNOSIS — Z8639 Personal history of other endocrine, nutritional and metabolic disease: Secondary | ICD-10-CM | POA: Diagnosis not present

## 2020-09-22 DIAGNOSIS — Z809 Family history of malignant neoplasm, unspecified: Secondary | ICD-10-CM

## 2020-09-22 DIAGNOSIS — E782 Mixed hyperlipidemia: Secondary | ICD-10-CM | POA: Diagnosis not present

## 2020-09-22 DIAGNOSIS — M5442 Lumbago with sciatica, left side: Secondary | ICD-10-CM

## 2020-09-22 DIAGNOSIS — M5136 Other intervertebral disc degeneration, lumbar region: Secondary | ICD-10-CM | POA: Diagnosis not present

## 2020-09-22 DIAGNOSIS — F17210 Nicotine dependence, cigarettes, uncomplicated: Secondary | ICD-10-CM

## 2020-09-22 DIAGNOSIS — M47812 Spondylosis without myelopathy or radiculopathy, cervical region: Secondary | ICD-10-CM | POA: Diagnosis not present

## 2020-09-22 DIAGNOSIS — R42 Dizziness and giddiness: Secondary | ICD-10-CM

## 2020-09-22 DIAGNOSIS — G8929 Other chronic pain: Secondary | ICD-10-CM | POA: Diagnosis not present

## 2020-09-22 DIAGNOSIS — M47816 Spondylosis without myelopathy or radiculopathy, lumbar region: Secondary | ICD-10-CM | POA: Diagnosis not present

## 2020-09-22 DIAGNOSIS — Z1159 Encounter for screening for other viral diseases: Secondary | ICD-10-CM | POA: Diagnosis not present

## 2020-09-22 LAB — COMPREHENSIVE METABOLIC PANEL
ALT: 21 U/L (ref 0–35)
AST: 15 U/L (ref 0–37)
Albumin: 4.1 g/dL (ref 3.5–5.2)
Alkaline Phosphatase: 85 U/L (ref 39–117)
BUN: 11 mg/dL (ref 6–23)
CO2: 29 mEq/L (ref 19–32)
Calcium: 9.4 mg/dL (ref 8.4–10.5)
Chloride: 102 mEq/L (ref 96–112)
Creatinine, Ser: 0.84 mg/dL (ref 0.40–1.20)
GFR: 86.56 mL/min (ref >60.00)
Glucose, Bld: 111 mg/dL — ABNORMAL HIGH (ref 70–99)
Potassium: 4.2 mEq/L (ref 3.5–5.1)
Sodium: 137 mEq/L (ref 135–145)
Total Bilirubin: 0.4 mg/dL (ref 0.2–1.2)
Total Protein: 7.1 g/dL (ref 6.0–8.3)

## 2020-09-22 LAB — CBC WITH DIFFERENTIAL/PLATELET
Basophils Absolute: 0 10*3/uL (ref 0.0–0.1)
Basophils Relative: 0.3 % (ref 0.0–3.0)
Eosinophils Absolute: 0.1 10*3/uL (ref 0.0–0.7)
Eosinophils Relative: 1.5 % (ref 0.0–5.0)
HCT: 39.1 % (ref 36.0–46.0)
Hemoglobin: 12.9 g/dL (ref 12.0–15.0)
Lymphocytes Relative: 29 % (ref 12.0–46.0)
Lymphs Abs: 2.2 10*3/uL (ref 0.7–4.0)
MCHC: 33 g/dL (ref 30.0–36.0)
MCV: 88 fl (ref 78.0–100.0)
Monocytes Absolute: 0.4 10*3/uL (ref 0.1–1.0)
Monocytes Relative: 5.9 % (ref 3.0–12.0)
Neutro Abs: 4.7 10*3/uL (ref 1.4–7.7)
Neutrophils Relative %: 63.3 % (ref 43.0–77.0)
Platelets: 276 10*3/uL (ref 150.0–400.0)
RBC: 4.44 Mil/uL (ref 3.87–5.11)
RDW: 14 % (ref 11.5–15.5)
WBC: 7.4 10*3/uL (ref 4.0–10.5)

## 2020-09-22 LAB — HEMOGLOBIN A1C: Hgb A1c MFr Bld: 6.1 % (ref 4.6–6.5)

## 2020-09-22 LAB — HEPATITIS C ANTIBODY
Hepatitis C Ab: NONREACTIVE
SIGNAL TO CUT-OFF: 0.01 (ref <1.00)

## 2020-09-22 LAB — LIPID PANEL
Cholesterol: 224 mg/dL — ABNORMAL HIGH (ref 0–200)
HDL: 36.2 mg/dL — ABNORMAL LOW (ref >39.00)
LDL Cholesterol: 159 mg/dL — ABNORMAL HIGH (ref 0–99)
NonHDL: 187.96
Total CHOL/HDL Ratio: 6
Triglycerides: 144 mg/dL (ref 0.0–149.0)
VLDL: 28.8 mg/dL (ref 0.0–40.0)

## 2020-09-22 LAB — TSH: TSH: 3.38 u[IU]/mL (ref 0.35–5.50)

## 2020-09-22 LAB — VITAMIN D 25 HYDROXY (VIT D DEFICIENCY, FRACTURES): VITD: 21.95 ng/mL — ABNORMAL LOW (ref 30.00–100.00)

## 2020-09-22 IMAGING — DX DG LUMBAR SPINE COMPLETE 4+V
5 series · 5 of 5 positions shown · non-contrast
Comparison: Lumbar radiographs [DATE].

CLINICAL DATA: low back pain

EXAM:
LUMBAR SPINE - COMPLETE 4+ VIEW

[l-spine ap]
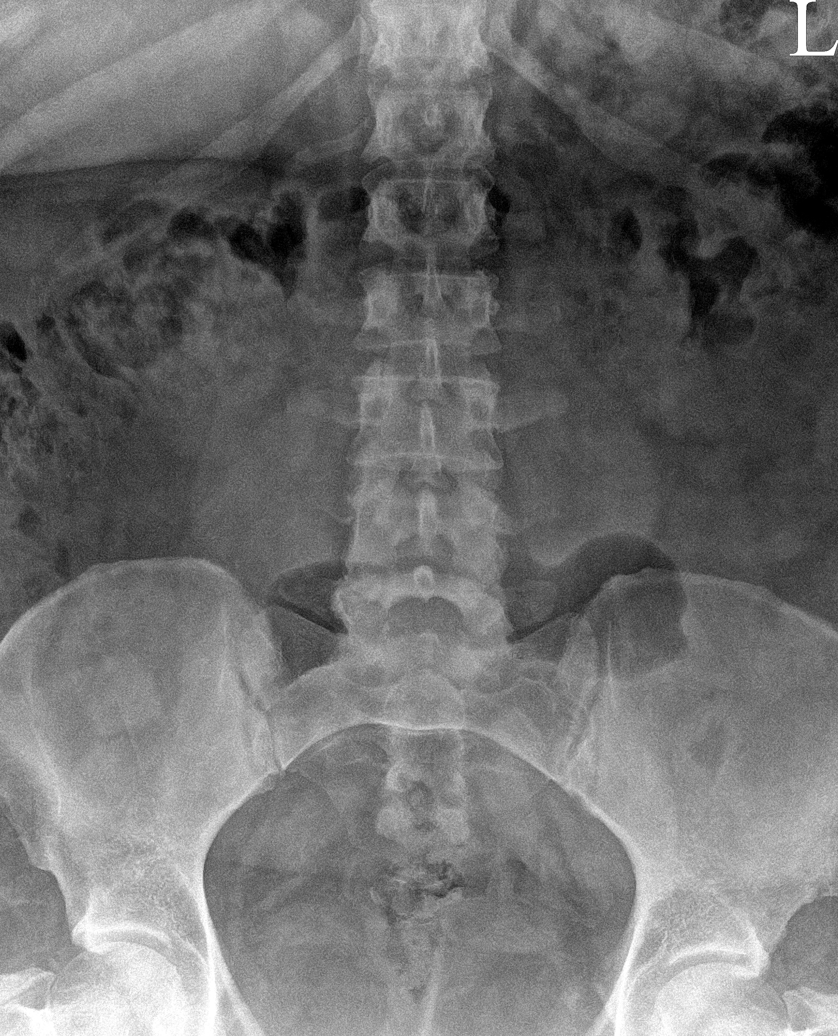

[l-spine obl (1 of 2)]
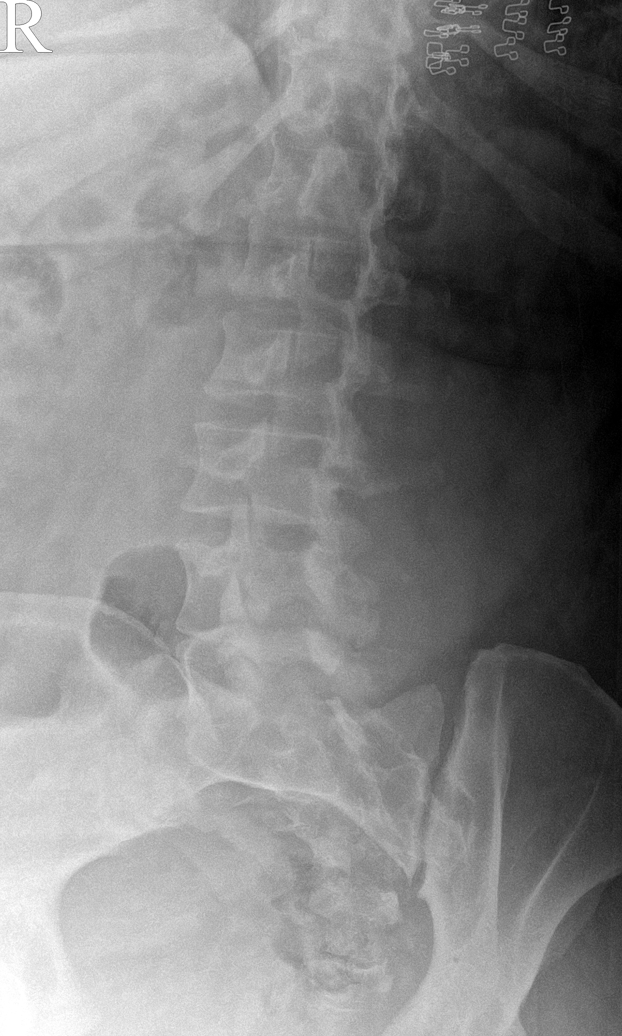

[l-spine obl (2 of 2)]
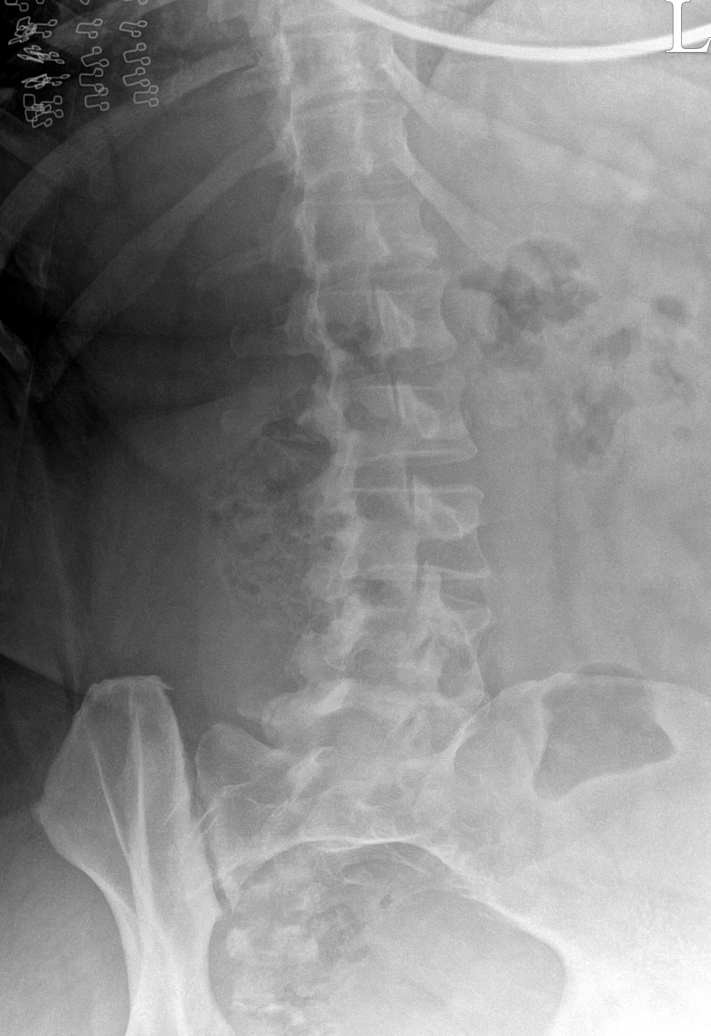

[l-spine lateral]
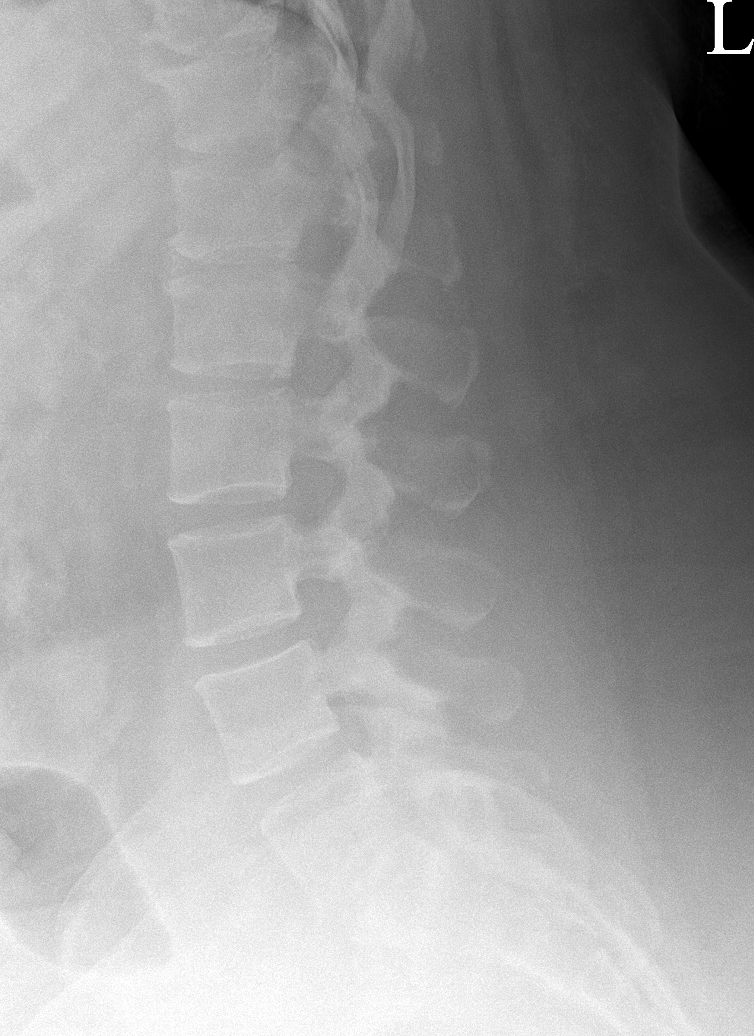

[l-spine spot]
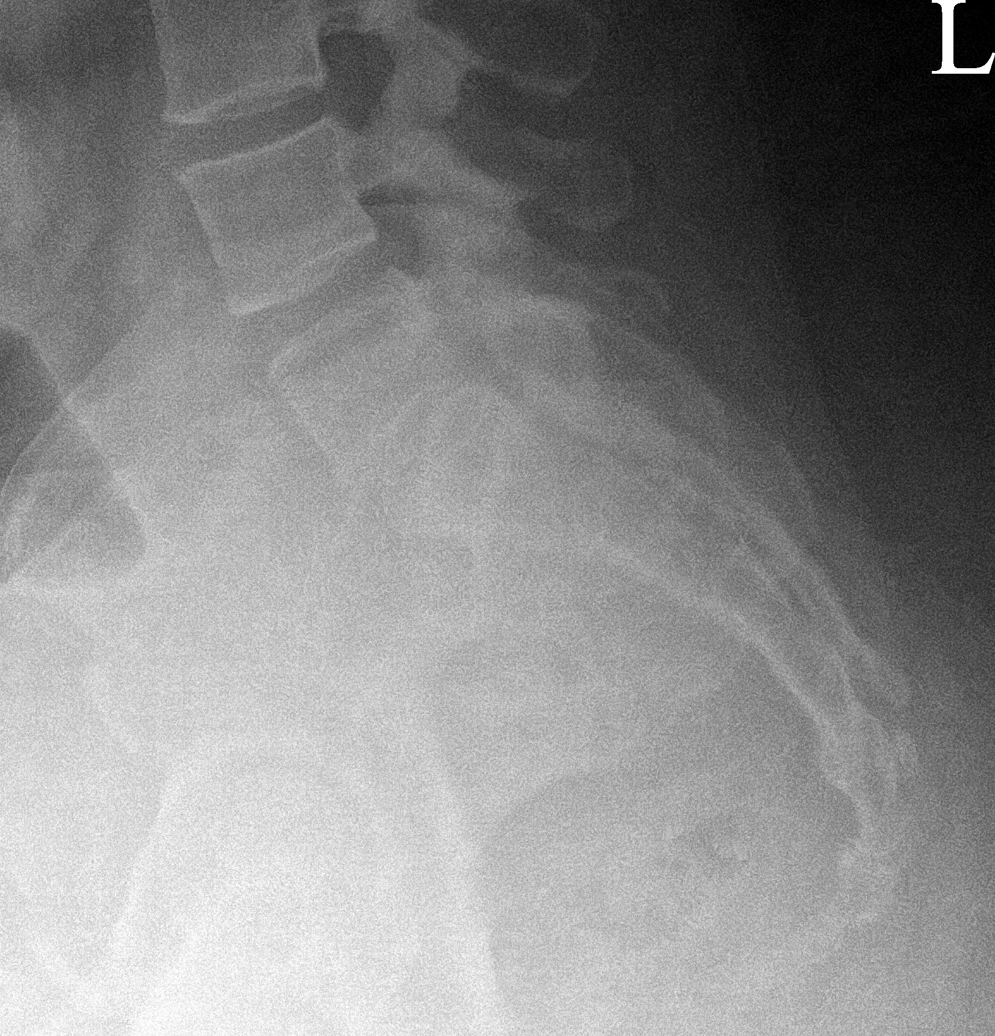

[5 of 5 positions shown; findings below may reference images not displayed]

FINDINGS: Degenerative disc disease at L5-S1 disc height loss and endplate
irregularity. Milder degenerative disc disease in the upper lumbar
spine. Lower lumbar facet arthropathy. No vertebral body height loss
to suggest acute fracture. No sagittal subluxation.
IMPRESSION: Degenerative disc disease at L5-S1 with disc height loss and
endplate irregularity. Lower lumbar facet arthropathy. An MRI could
further characterize if clinically indicated.

## 2020-09-22 IMAGING — DX DG CERVICAL SPINE COMPLETE 4+V
6 series · 6 of 6 positions shown · non-contrast
Comparison: None.

CLINICAL DATA: sensation changes in extremities

EXAM:
CERVICAL SPINE - COMPLETE 4+ VIEW

[c-spine lat]
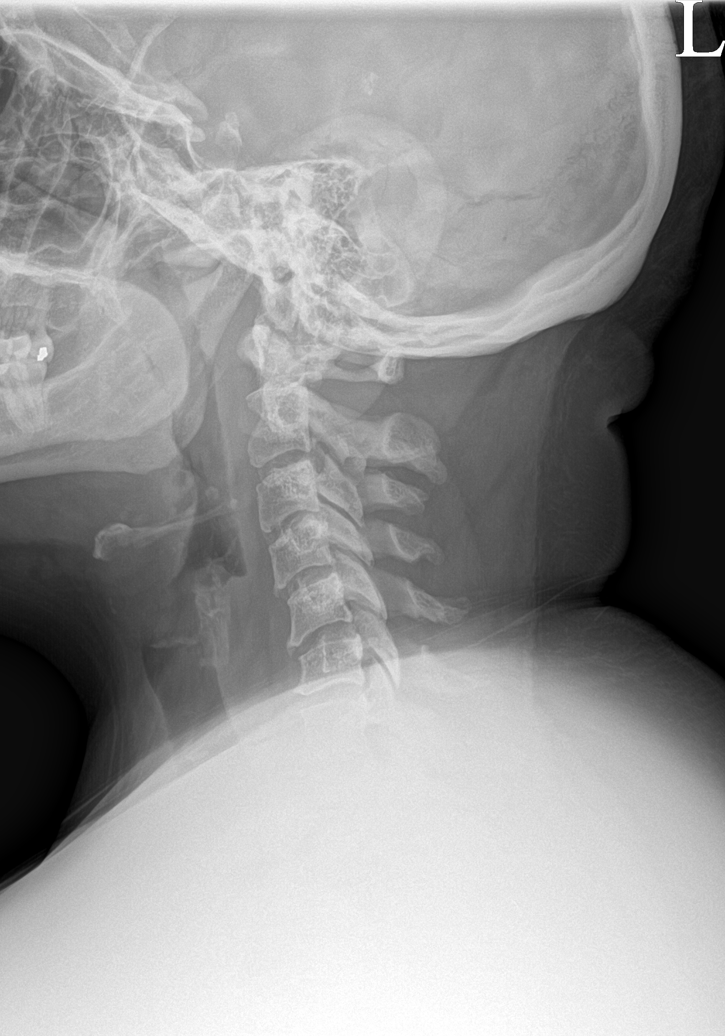

[c-spine obl (1 of 2)]
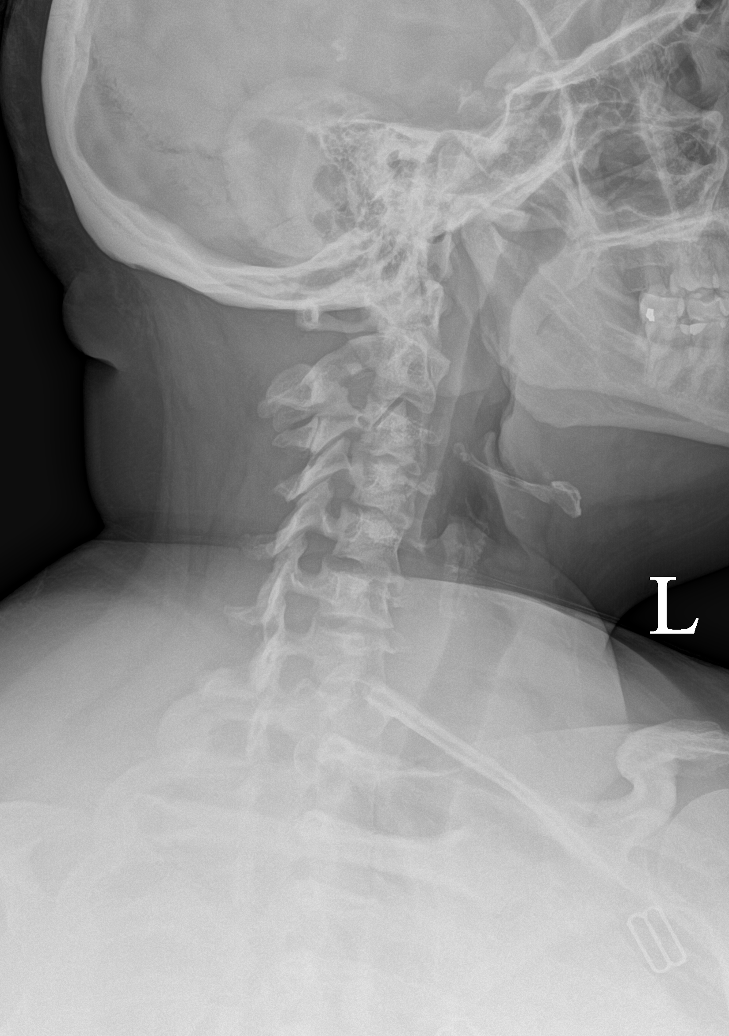

[c-spine obl (2 of 2)]
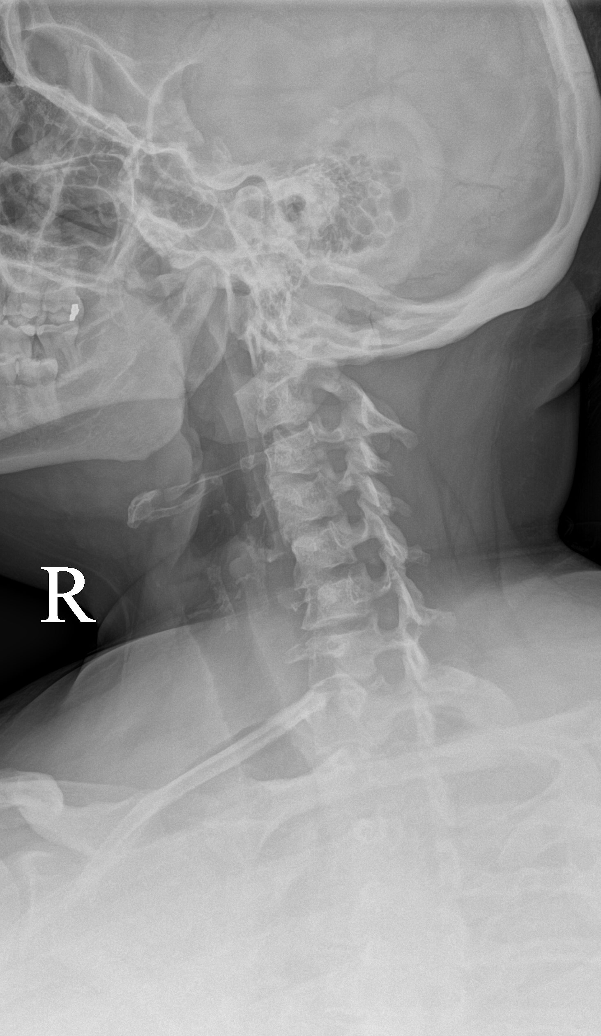

[c-spine ap]
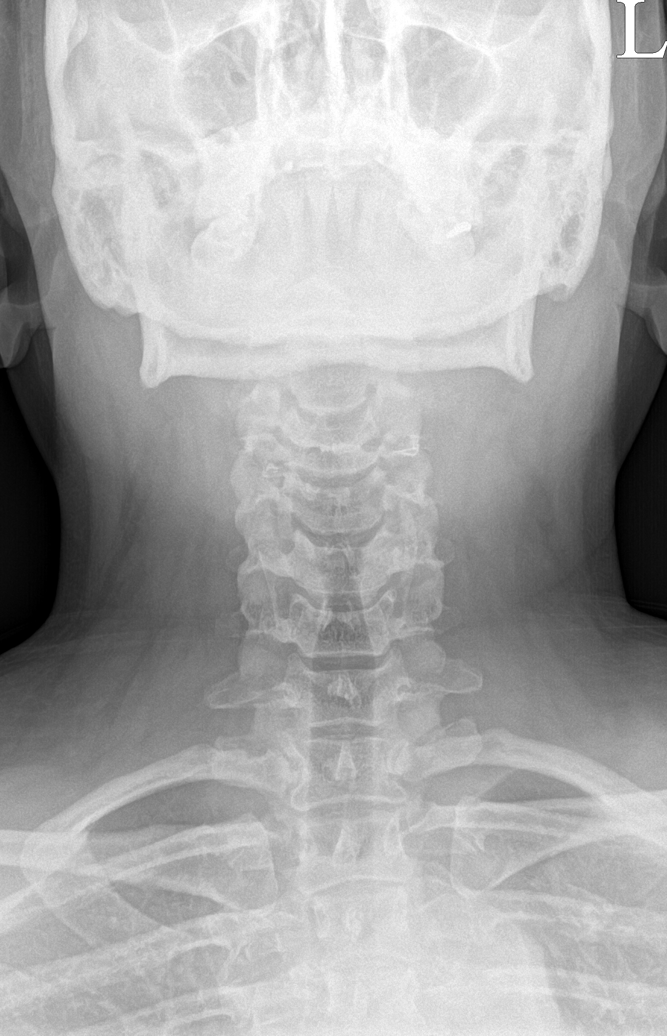

[ct-spine swimmers]
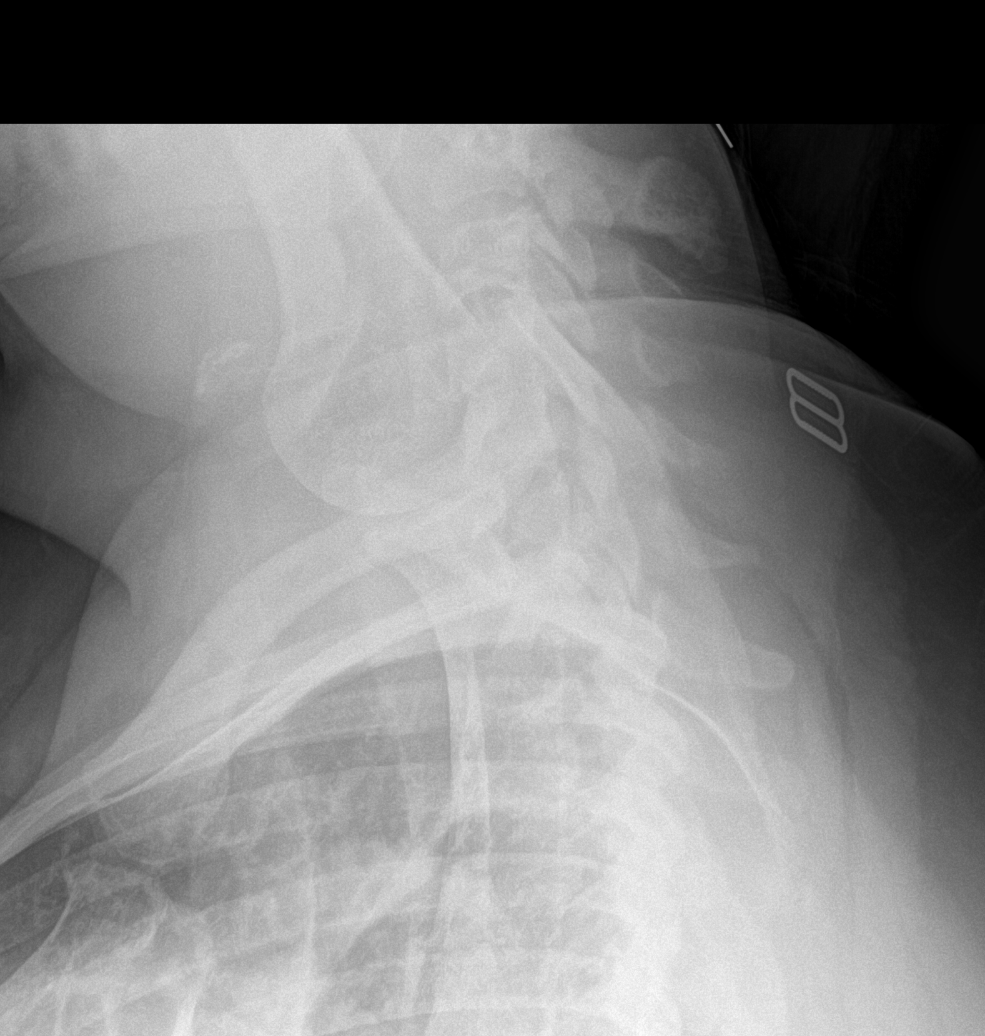

[c-spine open mouth]
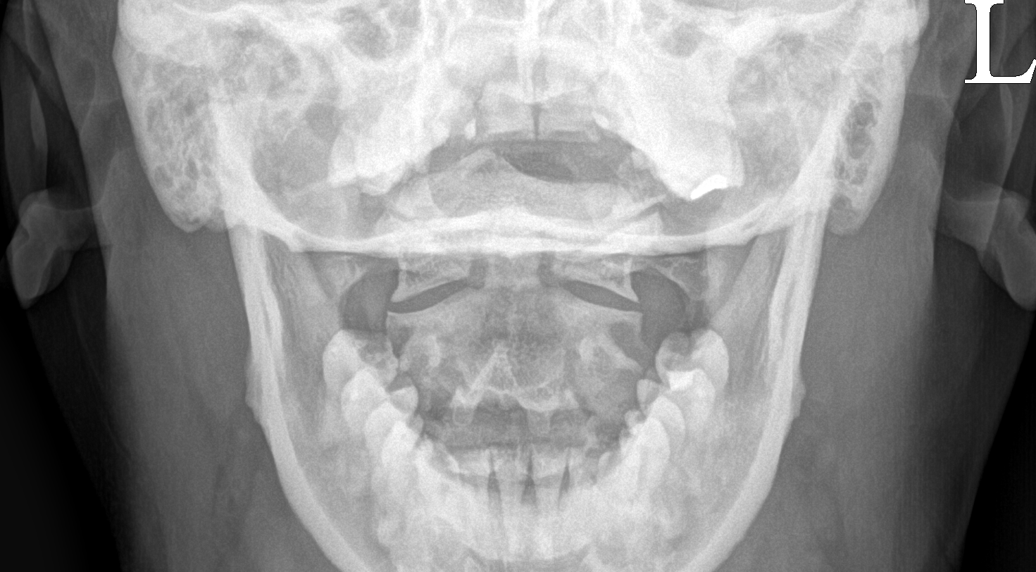

[6 of 6 positions shown; findings below may reference images not displayed]

FINDINGS: Mild reversal of the normal cervical lordosis with slight
anterolisthesis of C5 on C6. Lateral masses of C1 and C2 are
appropriately aligned on the open-mouth odontoid view. Mild
intervertebral disc height loss at multiple levels. Multilevel facet
arthropathy with probable foraminal narrowing on the left at C3-C4.
Prevertebral soft tissues are within normal limits. The visualized
lung apices are clear.
IMPRESSION: 1. No radiographic evidence of acute fracture.
2. Mild reversal of the normal cervical lordosis with slight
anterolisthesis of C5 on C6.
3. Multilevel facet arthropathy with probable foraminal narrowing on
the left at C3-C4. An MRI of the cervical spine could better
characterize the canal and foramina if clinically indicated.

## 2020-09-22 MED ORDER — CELECOXIB 200 MG PO CAPS: 200.0000 mg | ORAL_CAPSULE | Freq: Two times a day (BID) | ORAL | 1 refills | Status: DC

## 2020-09-22 NOTE — Progress Notes (Signed)
Subjective:  Patient ID: [name], female    DOB: [birth year]  Age: 40 y.o. MRN: [id number]  CC:  Chief Complaint  Patient presents with   Annual Exam   Other    Cheek tenderness, dizziness      HPI  This patient arrives today for the above.  Annual exam: She is due for flu shot as well as COVID-19 booster.  She is also due for breast exam.  She tells me her last Pap smear was completed about a year and a half ago and she does this with her OB/GYN.  She tells me it was normal.  She is due for hepatitis C screening.  Cheek tenderness: For the last 4 to 3 months has been experiencing tenderness to her left cheek.  She tells me is always tender to touch and sometimes to be tender without palpation.  She does not know of any triggers that cause it.  She does not feel like it is in her mandible/temporal joint.  Her main concern is that her mother had squamous cell carcinoma which was located in her mother's shoulder joint in the past.  She has not noticed any masses, enlarged lymph nodes, or skin color changes to the area but would like to be evaluated for this.  Dizziness: She is also been experiencing intermittent dizziness that seems to occur spontaneously but only last for a few seconds.  It sounds like it may occur with position changes.  She denies any acute visual changes but does experience tinnitus intermittently.  She denies any ear pain.  She does experience chest tightness intermittently as well but this does not seem to be occurring very frequently and she does not note any triggering factors such as activity.  She is asking for refill on her Celebrex which she takes for osteoarthritis.  Past Medical History:  Diagnosis Date   Anxiety    Back pain    Bilateral swelling of feet    Chest pain    Cluster headaches    Depression    Eczema    GERD (gastroesophageal reflux disease)    Gestational diabetes 2012, 2016   Hyperlipidemia    Hypertension    no meds  currently   Joint pain    Osteoarthritis    PCOS (polycystic ovarian syndrome)    Pre-diabetes    Sciatica    Shortness of breath    Sleep apnea    Snoring    sleep study 02/2012 with min AHI events   Vitamin D deficiency       Family History  Problem Relation Age of Onset   Arthritis Mother    Alcohol abuse Mother    Mental illness Mother    Cancer Mother 65       squamous - unknown primary   Coronary artery disease Mother 48       MI with stent   Anxiety disorder Mother    Depression Mother    Diabetes Mother    Obesity Mother    Arthritis Father    Alcohol abuse Father    Hyperlipidemia Father    Heart disease Father    Kidney disease Father    Stroke Father    Mental illness Father    Drug abuse Father    Hypertension Father    Depression Father    Alcoholism Father    Diabetes Maternal Grandmother    Heart disease Maternal Grandmother    Stroke Maternal Grandfather  Hypertension Paternal Grandfather    Hyperlipidemia Paternal Grandfather    Arthritis Other    Alcohol abuse Other    Breast cancer Paternal Aunt    Anxiety disorder Sister     Social History   Social History Narrative   Network engineer   Married, lives with spouse and 2 kids   36 to [facility] summer 2011 to be near mom - originally from [place]   Denies abuse and feels safe at home.   Social History   Tobacco Use   Smoking status: Every Day    Packs/day: 0.25    Years: 15.00    Pack years: 3.75    Types: Cigarettes   Smokeless tobacco: Never  Substance Use Topics   Alcohol use: Yes    Comment: once a month or less     Current Meds  Medication Sig   busPIRone (BUSPAR) 10 MG tablet Take 1 tablet (10 mg total) by mouth 3 (three) times daily. For mood control   FLUoxetine (PROZAC) 20 MG capsule TAKE 3 CAPSULES BY MOUTH DAILY FOR MOOD CONTROL   [DISCONTINUED] celecoxib (CELEBREX) 200 MG capsule Take 200 mg by mouth 2 (two) times daily.     ROS:  Review of Systems  Constitutional:   Positive for malaise/fatigue. Negative for fever and weight loss.  HENT:  Positive for tinnitus. Negative for hearing loss.   Eyes:  Negative for blurred vision and double vision.  Respiratory:  Negative for cough and shortness of breath.   Cardiovascular:  Negative for chest pain and palpitations.  Gastrointestinal:  Negative for abdominal pain and blood in stool.  Genitourinary:  Negative for hematuria.  Neurological:  Positive for dizziness and headaches.  Psychiatric/Behavioral:  Positive for hallucinations (auditory). Negative for suicidal ideas. The patient is not nervous/anxious.     Objective:   Today's Vitals: BP 118/80   Pulse 66   Temp 98.4 F (36.9 C) (Oral)   Ht '5\' 3"'$  (1.6 m)   Wt (!) 317 lb (143.8 kg)   LMP 09/07/2020   SpO2 98%   BMI 56.15 kg/m  Vitals with BMI 09/22/2020 06/02/2019 05/12/2019  Height '5\' 3"'$  '5\' 3"'$  '5\' 3"'$   Weight 317 lbs 303 lbs 305 lbs  BMI 56.17 123456 [id number]  Systolic 123456 [id number]  Diastolic 80 75 80  Pulse 66 79 73  Some encounter information is confidential and restricted. Go to Review Flowsheets activity to see all data.     Physical Exam Vitals reviewed. Exam conducted with a chaperone present.  Constitutional:      Appearance: Normal appearance.  HENT:     Head: Normocephalic and atraumatic. No masses.     Jaw: Tenderness present. No swelling.     Salivary Glands: Right salivary gland is not diffusely enlarged or tender. Left salivary gland is not diffusely enlarged or tender.     Right Ear: Hearing, tympanic membrane, ear canal and external ear normal.     Left Ear: Hearing, tympanic membrane, ear canal and external ear normal.     Nose:     Right Sinus: No maxillary sinus tenderness or frontal sinus tenderness.     Left Sinus: No maxillary sinus tenderness or frontal sinus tenderness.  Eyes:     General:        Right eye: No discharge.        Left eye: No discharge.     Extraocular Movements: Extraocular movements intact.      Conjunctiva/sclera: Conjunctivae normal.     Pupils:  Pupils are equal, round, and reactive to light.  Neck:     Vascular: No carotid bruit.  Cardiovascular:     Rate and Rhythm: Normal rate and regular rhythm.     Pulses: Normal pulses.     Heart sounds: Normal heart sounds. No murmur heard. Pulmonary:     Effort: Pulmonary effort is normal.     Breath sounds: Normal breath sounds.  Chest:  Breasts:    Breasts are symmetrical.     Right: Normal. No inverted nipple, mass, skin change or tenderness.     Left: Normal. No inverted nipple, mass, skin change or tenderness.  Abdominal:     General: Abdomen is flat. Bowel sounds are normal. There is no distension.     Palpations: Abdomen is soft. There is no mass.     Tenderness: There is no abdominal tenderness.  Musculoskeletal:        General: No tenderness.     Cervical back: Neck supple. No muscular tenderness.     Right lower leg: No edema.     Left lower leg: No edema.  Lymphadenopathy:     Cervical: No cervical adenopathy.     Right cervical: No superficial cervical adenopathy.    Left cervical: No superficial cervical adenopathy.     Upper Body:     Right upper body: No supraclavicular adenopathy.     Left upper body: No supraclavicular adenopathy.  Skin:    General: Skin is warm and dry.  Neurological:     General: No focal deficit present.     Mental Status: She is alert and oriented to person, place, and time.     Sensory: Sensory deficit (left upper extremity and lower extremity had reduced sensation on exam) present.     Motor: No weakness.     Gait: Gait normal.  Psychiatric:        Mood and Affect: Mood normal.        Behavior: Behavior normal.        Judgment: Judgment normal.         Assessment and Plan   1. Encounter for general adult medical examination with abnormal findings   2. Need for influenza vaccination   3. Encounter for hepatitis C screening test for low risk patient   4. Prediabetes   5.  Mixed hyperlipidemia   6. History of vitamin D deficiency   7. Arthritis   8. Pain of cheek   9. Family history of squamous cell carcinoma   10. Chronic bilateral low back pain with bilateral sciatica   11. Reduced sensation   12. Dizziness   13. Cigarette nicotine dependence without complication      Plan: 1.-3.  We will have flu shot Mr. Today.  We did discuss again the COVID-19 booster and she will consider this in the future.  Breast exam completed today no abnormalities noted.  We will screen for hepatitis C. 4.-6.  We will check blood work today for further evaluation. 7.  Celebrex refill ordered and sent to patient's pharmacy. 8., 9.  Exam reassuring no lymphadenopathy noted, no mass noted, no color changes or new lesions noted.  However patient is quite concerned and for further evaluation will get ultrasound of the area and refer to dermatology for further assistance with evaluating.  10-11.  We will get x-ray of cervical spine as well is lumbar spine because she did have mildly reduced sensation on her left upper extremity and left lower extremity in addition  with chronic low back pain.  May consider head imaging if x-ray comes back normal. 12.  We will discuss at follow-up further, appears to be transient and only occurs for a few moments at a time.  Most likely benign cause, but will have to discuss in further detail at next office visit. 13.  She is encouraged to consider quitting smoking we did discuss options such as nicotine gum or nicotine patches.  She will consider this.    Tests ordered Orders Placed This Encounter  Procedures   US Soft Tissue Head/Neck (NON-THYROID)   DG Cervical Spine Complete   DG Lumbar Spine Complete   Flu Vaccine QUAD 20moIM (Fluarix, Fluzone & Alfiuria Quad PF)   TSH   Hemoglobin A1c   Lipid panel   Comprehensive metabolic panel   CBC with Differential/Platelet   Vitamin D (25 hydroxy)   Hepatitis C antibody   Ambulatory referral to  Dermatology      Meds ordered this encounter  Medications   celecoxib (CELEBREX) 200 MG capsule    Sig: Take 1 capsule (200 mg total) by mouth 2 (two) times daily.    Dispense:  180 capsule    Refill:  1    Order Specific Question:   Supervising Provider    Answer:   BBinnie Rail[[id number]   Patient to follow-up in 2 weeks or sooner as needed.  In addition to performing her annual physical exam I also performed an office visit to address her concerns.    SAilene Ards NP

## 2020-09-23 ENCOUNTER — Ambulatory Visit
Admission: RE | Admit: 2020-09-23 | Discharge: 2020-09-23 | Disposition: A | Discharge status: Home / Self Care | Payer: Federal, State, Local not specified - PPO | Source: Ambulatory Visit | Attending: Nurse Practitioner | Admitting: Nurse Practitioner

## 2020-09-23 ENCOUNTER — Other Ambulatory Visit: Payer: Self-pay | Admitting: Nurse Practitioner

## 2020-09-23 ENCOUNTER — Encounter: Payer: Self-pay | Admitting: Nurse Practitioner

## 2020-09-23 DIAGNOSIS — R519 Headache, unspecified: Secondary | ICD-10-CM

## 2020-09-23 DIAGNOSIS — R201 Hypoesthesia of skin: Secondary | ICD-10-CM

## 2020-09-23 DIAGNOSIS — R6884 Jaw pain: Secondary | ICD-10-CM | POA: Diagnosis not present

## 2020-09-23 DIAGNOSIS — R937 Abnormal findings on diagnostic imaging of other parts of musculoskeletal system: Secondary | ICD-10-CM

## 2020-09-23 DIAGNOSIS — M47816 Spondylosis without myelopathy or radiculopathy, lumbar region: Secondary | ICD-10-CM

## 2020-09-23 DIAGNOSIS — M1990 Unspecified osteoarthritis, unspecified site: Secondary | ICD-10-CM

## 2020-09-23 DIAGNOSIS — G8929 Other chronic pain: Secondary | ICD-10-CM

## 2020-09-23 IMAGING — US US SOFT TISSUE HEAD/NECK
1 series · 14 of 25 positions shown · non-contrast
Comparison: None.

CLINICAL DATA: Cheek pain.

EXAM:
ULTRASOUND OF HEAD/NECK SOFT TISSUES
TECHNIQUE: Ultrasound examination of the head and neck soft tissues was
performed in the area of clinical concern.

[Series 1: us soft tissue head/neck · 0.06mm/px · 14 of 28 slices shown]
[im 1/28]
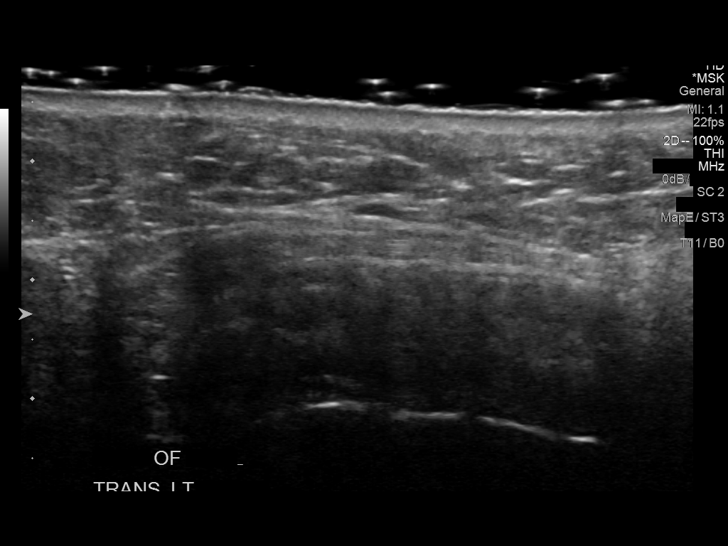
[im 3/28]
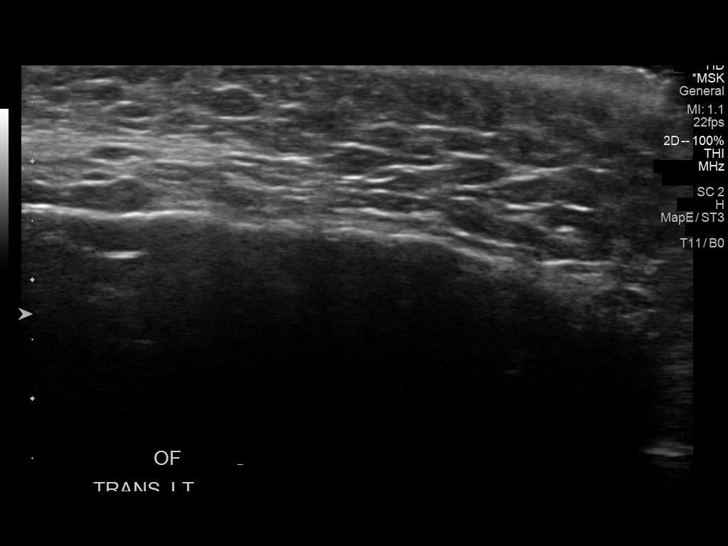
[im 5/28]
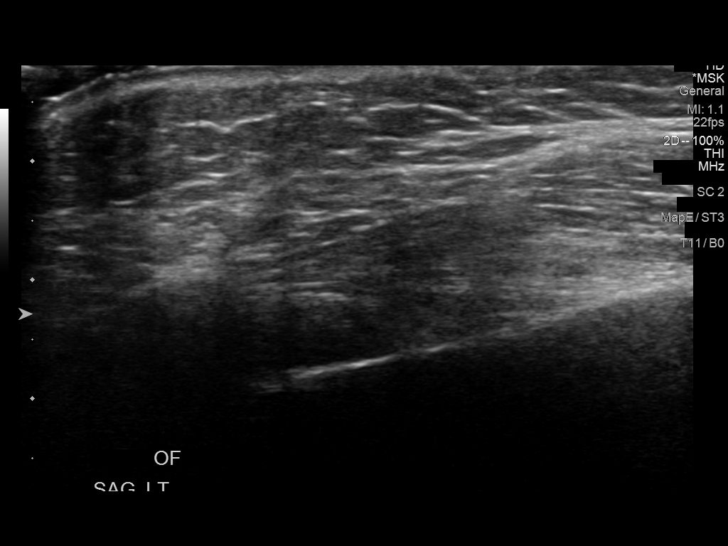
[im 7/28]
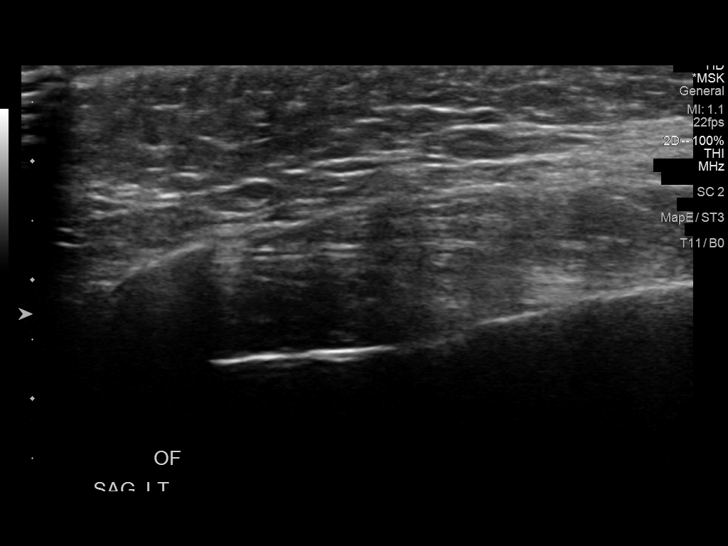
[im 10/28]
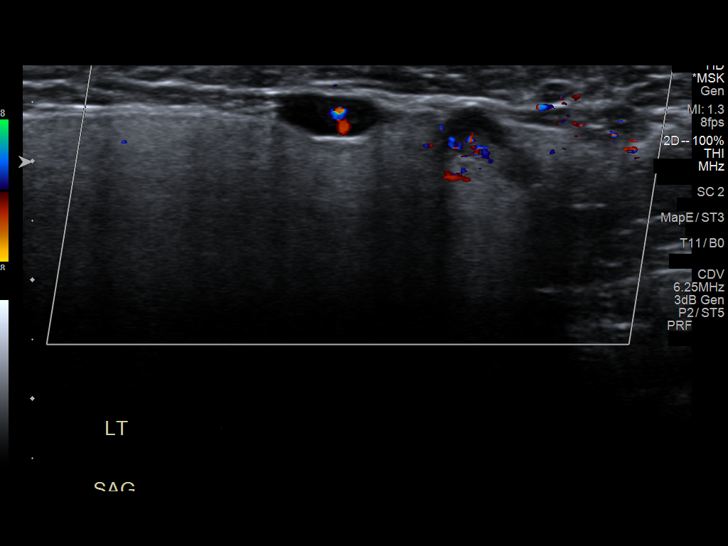
[im 11/28]
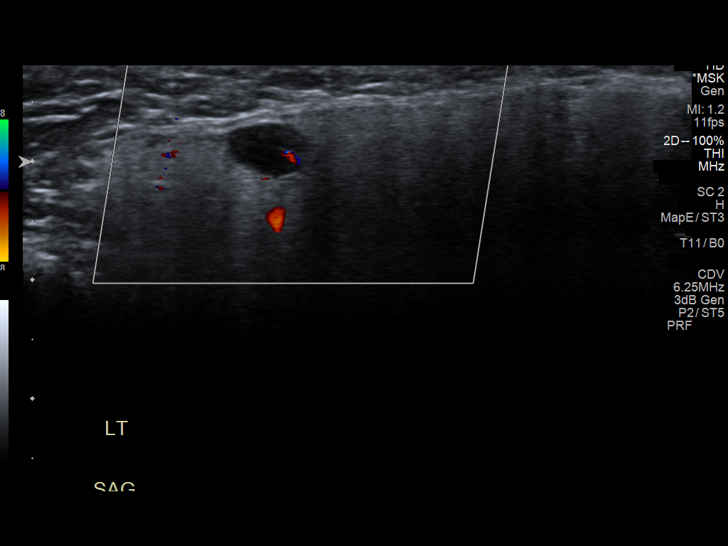
[im 13/28]
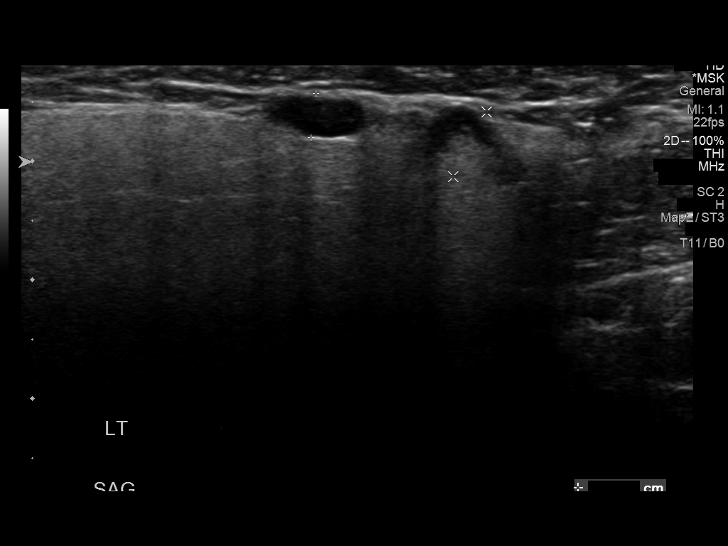
[im 15/28]
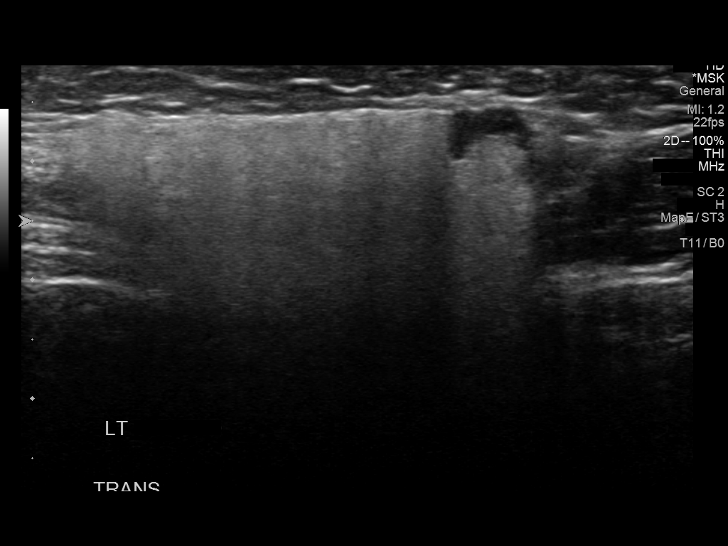
[im 17/28]
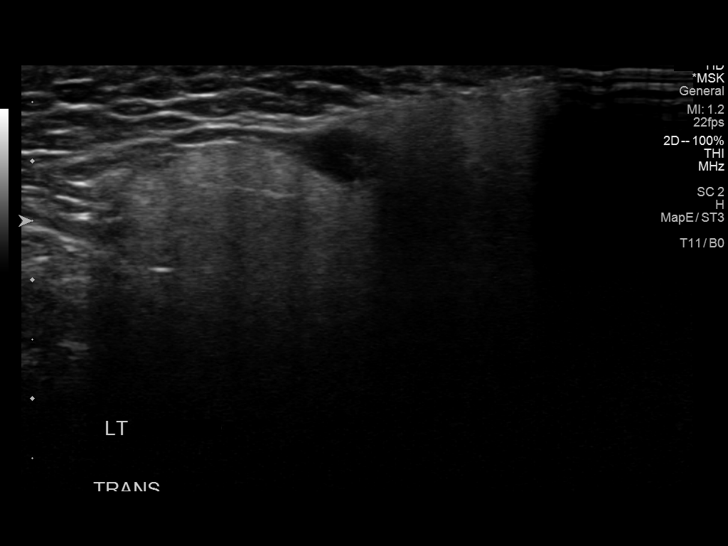
[im 19/28]
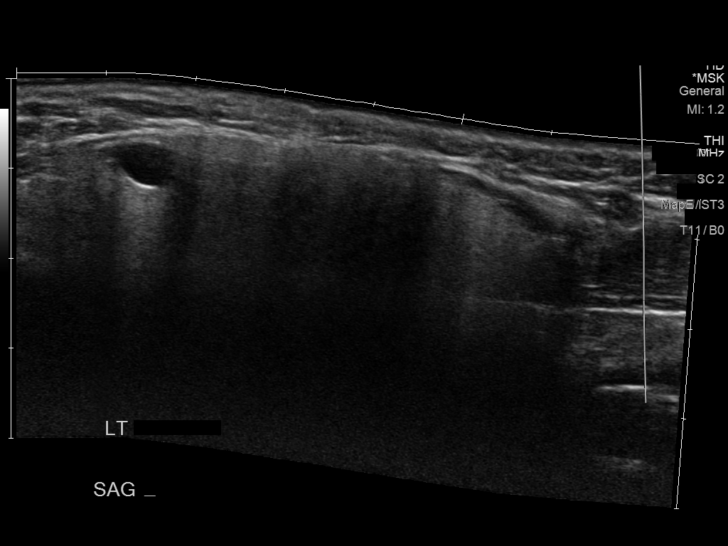
[im 21/28]
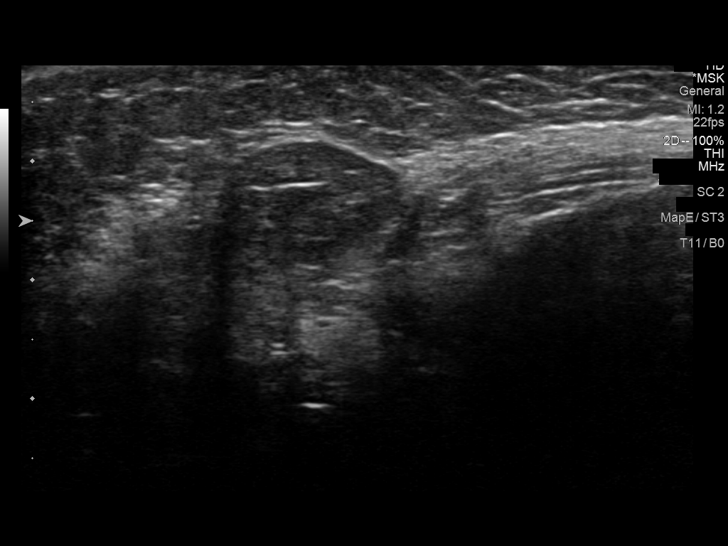
[im 23/28]
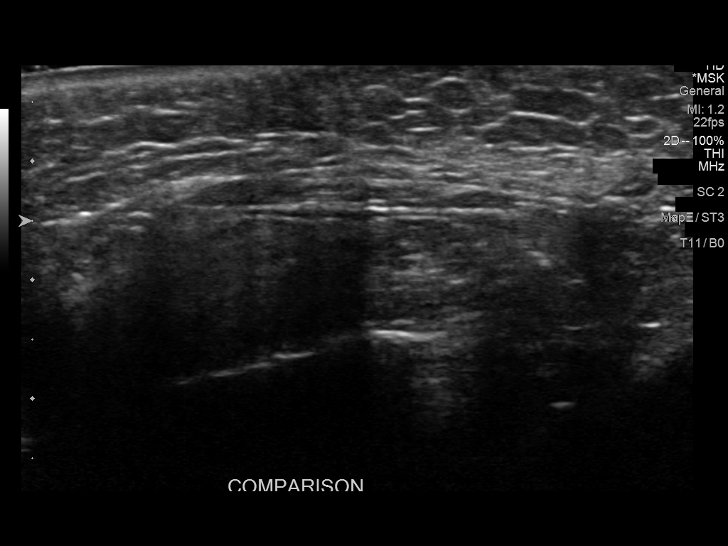
[im 25/28]
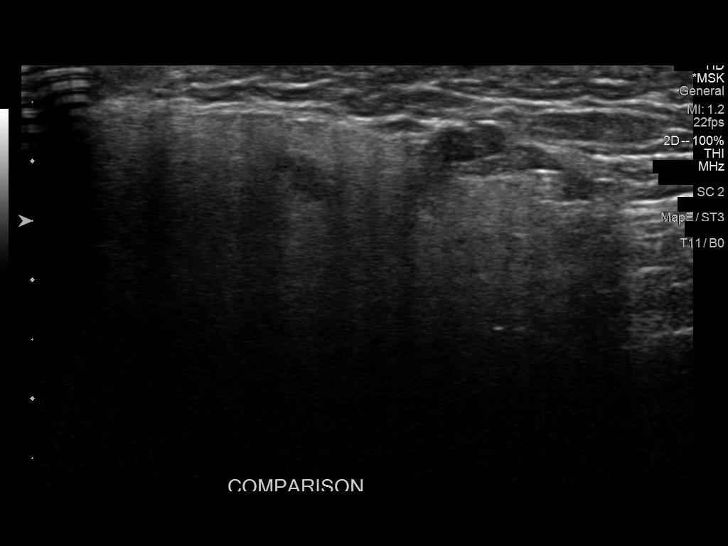
[im 28/28]
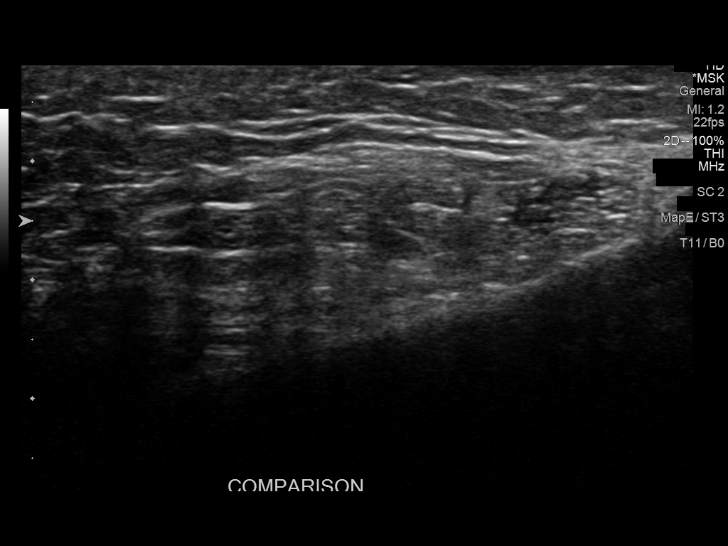

[14 of 25 positions shown; findings below may reference images not displayed]

FINDINGS: Focused ultrasound along the area of palpable concern, at the LEFT
cheek.

Normal subcutaneous structures, including the LEFT parotid gland and
intraparotid lymph nodes. A correlate sonogram of the RIGHT parotid
gland for comparison was acquired and is normal.

No abnormal subcutaneous mass or fluid collection is demonstrated.
No cervical adenopathy within the imaged neck.

Of note, per the sonographer the patient's reported pain was
adjacent to the parotid gland.
IMPRESSION: 1. No sonographic evidence of abnormal subcutaneous mass, adenopathy
or fluid collection within the imaged LEFT neck.
2. Symptomatic discomfort reported over the LEFT parotid gland
without sonographic correlate. Correlate for potential parotitis.

## 2020-09-23 MED ORDER — CELECOXIB 200 MG PO CAPS: 200.0000 mg | ORAL_CAPSULE | Freq: Two times a day (BID) | ORAL | 1 refills | Status: DC

## 2020-09-29 ENCOUNTER — Telehealth (INDEPENDENT_AMBULATORY_CARE_PROVIDER_SITE_OTHER): Payer: Federal, State, Local not specified - PPO | Admitting: Psychiatry

## 2020-09-29 ENCOUNTER — Other Ambulatory Visit: Payer: Self-pay

## 2020-09-29 DIAGNOSIS — F411 Generalized anxiety disorder: Secondary | ICD-10-CM

## 2020-09-29 DIAGNOSIS — F332 Major depressive disorder, recurrent severe without psychotic features: Secondary | ICD-10-CM

## 2020-09-29 MED ORDER — FLUOXETINE HCL 20 MG PO CAPS: ORAL_CAPSULE | ORAL | 1 refills | Status: DC

## 2020-09-29 MED ORDER — BUSPIRONE HCL 10 MG PO TABS: 10.0000 mg | ORAL_TABLET | Freq: Three times a day (TID) | ORAL | 1 refills | Status: DC

## 2020-09-29 NOTE — Progress Notes (Signed)
Virtual Visit via Telephone Note  I connected with [name] on 09/29/20 at  8:30 AM EDT by telephone and verified that I am speaking with the correct person using two identifiers. We attempted to connect by video several times but it was working.  Location: Patient: home Provider: office   I discussed the limitations, risks, security and privacy concerns of performing an evaluation and management service by telephone and the availability of in person appointments. I also discussed with the patient that there may be a patient responsible charge related to this service. The patient expressed understanding and agreed to proceed.   History of Present Illness: [name] shares that she has a lot going on but is managing. Her depression is stable. [name] states her anxiety is manageable. She is good about taking her AM and PM meds. She often misses her afternoon dose of Buspar. She defiantly notices when she misses her afternoon dose of Buspar. She has an alarm and is now putting a bottle of Buspar on her work desk to see if that will help. Her sleep is good. [name] is able to fall asleep easily but does admit that she wakes up due to pain but is able to fall back asleep quickly. Her energy is fair.  Her appetite is good. [name] states her motivation is better over the last few months. She denies anhedonia. She denies SI/HI.    Observations/Objective:  General Appearance: unable to assess  Eye Contact:  unable to assess  Speech:  Clear and Coherent and Normal Rate  Volume:  Normal  Mood:  Euthymic  Affect:  Full Range  Thought Process:  Goal Directed, Linear, and Descriptions of Associations: Intact  Orientation:  Full (Time, Place, and Person)  Thought Content:  Logical  Suicidal Thoughts:  No  Homicidal Thoughts:  No  Memory:  Immediate;   Good  Judgement:  Good  Insight:  Good  Psychomotor Activity: unable to assess  Concentration:  Concentration: Good  Recall:  Good  Fund of  Knowledge:  Good  Language:  Good  Akathisia:  unable to assess  Handed:  unable to assess  AIMS (if indicated):     Assets:  Communication Skills Desire for Improvement Financial Resources/Insurance Housing Intimacy Leisure Time Resilience Social Support Talents/Skills Transportation Vocational/Educational  ADL's:  unable to assess  Cognition:  WNL  Sleep:        Assessment and Plan: Depression screen [facility] 2/9 09/29/2020 09/22/2020 05/05/2020 04/07/2019 10/27/2018  Decreased Interest 0 0 0 0 3  Down, Depressed, Hopeless 0 0 1 0 2  PHQ - 2 Score 0 0 1 0 5  Altered sleeping - - 0 0 3  Tired, decreased energy - - 0 1 3  Change in appetite - - 0 0 2  Feeling bad or failure about yourself  - - 0 0 3  Trouble concentrating - - 0 0 1  Moving slowly or fidgety/restless - - 0 0 3  Suicidal thoughts - - 0 0 1  PHQ-9 Score - - 1 1 21   Difficult doing work/chores - - Not difficult at all Not difficult at all Very difficult  Some recent data might be hidden    Flowsheet Row Video Visit from 09/29/2020 in [facility] Video Visit from 05/05/2020 in [facility] Admission (Discharged) from 09/16/2017 in [facility]  C-SSRS RISK CATEGORY No Risk No Risk High Risk      1. GAD (  generalized anxiety disorder) - busPIRone (BUSPAR) 10 MG tablet; Take 1 tablet (10 mg total) by mouth 3 (three) times daily. For mood control  Dispense: 270 tablet; Refill: 1 - FLUoxetine (PROZAC) 20 MG capsule; TAKE 3 CAPSULES BY MOUTH DAILY FOR MOOD CONTROL  Dispense: 270 capsule; Refill: 1  2. Severe episode of recurrent major depressive disorder, without psychotic features (HCC) - FLUoxetine (PROZAC) 20 MG capsule; TAKE 3 CAPSULES BY MOUTH DAILY FOR MOOD CONTROL  Dispense: 270 capsule; Refill: 1   Follow Up Instructions: In 5-6 months or sooner if needed   I discussed the assessment and treatment  plan with the patient. The patient was provided an opportunity to ask questions and all were answered. The patient agreed with the plan and demonstrated an understanding of the instructions.   The patient was advised to call back or seek an in-person evaluation if the symptoms worsen or if the condition fails to improve as anticipated.  I provided 9 minutes of non-face-to-face time during this encounter.   Charlcie Cradle, MD

## 2020-10-03 DIAGNOSIS — M5416 Radiculopathy, lumbar region: Secondary | ICD-10-CM | POA: Diagnosis not present

## 2020-10-03 DIAGNOSIS — M5412 Radiculopathy, cervical region: Secondary | ICD-10-CM | POA: Diagnosis not present

## 2020-10-06 ENCOUNTER — Other Ambulatory Visit: Payer: Self-pay | Admitting: Neurosurgery

## 2020-10-06 ENCOUNTER — Other Ambulatory Visit: Payer: Self-pay | Admitting: Nurse Practitioner

## 2020-10-06 ENCOUNTER — Encounter: Payer: Self-pay | Admitting: Nurse Practitioner

## 2020-10-06 ENCOUNTER — Ambulatory Visit (INDEPENDENT_AMBULATORY_CARE_PROVIDER_SITE_OTHER): Payer: Federal, State, Local not specified - PPO | Admitting: Nurse Practitioner

## 2020-10-06 ENCOUNTER — Other Ambulatory Visit: Payer: Self-pay

## 2020-10-06 VITALS — BP 128/90 | HR 82 | Temp 98.2°F | Resp 18 | Ht 63.0 in | Wt 316.0 lb

## 2020-10-06 DIAGNOSIS — E785 Hyperlipidemia, unspecified: Secondary | ICD-10-CM | POA: Diagnosis not present

## 2020-10-06 DIAGNOSIS — H8110 Benign paroxysmal vertigo, unspecified ear: Secondary | ICD-10-CM | POA: Diagnosis not present

## 2020-10-06 DIAGNOSIS — E559 Vitamin D deficiency, unspecified: Secondary | ICD-10-CM

## 2020-10-06 DIAGNOSIS — M5412 Radiculopathy, cervical region: Secondary | ICD-10-CM

## 2020-10-06 DIAGNOSIS — R7303 Prediabetes: Secondary | ICD-10-CM

## 2020-10-06 DIAGNOSIS — M26622 Arthralgia of left temporomandibular joint: Secondary | ICD-10-CM

## 2020-10-06 DIAGNOSIS — M5416 Radiculopathy, lumbar region: Secondary | ICD-10-CM

## 2020-10-06 MED ORDER — MECLIZINE HCL 25 MG PO TABS: 25.0000 mg | ORAL_TABLET | Freq: Three times a day (TID) | ORAL | 1 refills | Status: DC | PRN

## 2020-10-06 NOTE — Progress Notes (Signed)
Subjective:  Patient ID: [name], female    DOB: [birth year]  Age: 40 y.o. MRN: [id number]  CC:  Chief Complaint  Patient presents with   Dizziness    Ongoing issue. Started in June. She stated that her dizziness comes in waves.       HPI  This patient arrives today for the above.  Left cheek pain: At last office visit we discussed this briefly as she was a new patient of mine.  This is been going on for some time and she is very concerned that it could be a undiagnosed skin cancer.  The pain only occurs when she palpates the area.  She denies any history of Sjogren's syndrome or dry eyes, dry mouth, or multiple dental caries.  She was referred to dermatology and has an appointment coming up in March.  No lesion noted on the cheek itself.  She also underwent ultrasound which did not show any acute abnormality.  Dizziness: Her dizziness has been ongoing for a few years.  She tells me about 4 years ago she had a episode where her dizziness lasted a couple of days but eventually went away.  Now, she experiences dizziness that lasts briefly for a few moments and seems to be exacerbated with quick head movements or position changes.  She has not passed out or felt like she is going to pass out with the episodes.  She also denies any double vision, weakness, or sensory changes throughout her body during the episodes.  Blood work was also collected at last office visit which did show hyperlipidemia, prediabetes, and vitamin D deficiency.  Past Medical History:  Diagnosis Date   Anxiety    Back pain    Bilateral swelling of feet    Chest pain    Cluster headaches    Depression    Eczema    GERD (gastroesophageal reflux disease)    Gestational diabetes 2012, 2016   Hyperlipidemia    Hypertension    no meds currently   Joint pain    Osteoarthritis    PCOS (polycystic ovarian syndrome)    Pre-diabetes    Sciatica    Shortness of breath    Sleep apnea    Snoring    sleep  study 02/2012 with min AHI events   Vitamin D deficiency       Family History  Problem Relation Age of Onset   Arthritis Mother    Alcohol abuse Mother    Mental illness Mother    Cancer Mother 107       squamous - unknown primary   Coronary artery disease Mother 43       MI with stent   Anxiety disorder Mother    Depression Mother    Diabetes Mother    Obesity Mother    Arthritis Father    Alcohol abuse Father    Hyperlipidemia Father    Heart disease Father    Kidney disease Father    Stroke Father    Mental illness Father    Drug abuse Father    Hypertension Father    Depression Father    Alcoholism Father    Diabetes Maternal Grandmother    Heart disease Maternal Grandmother    Stroke Maternal Grandfather    Hypertension Paternal Grandfather    Hyperlipidemia Paternal Grandfather    Arthritis Other    Alcohol abuse Other    Breast cancer Paternal Aunt    Anxiety disorder Sister  Social 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   Social History   Tobacco Use   Smoking status: Every Day    Packs/day: 0.25    Years: 15.00    Pack years: 3.75    Types: Cigarettes   Smokeless tobacco: Never  Substance Use Topics   Alcohol use: Yes    Comment: once a month or less     Current Meds  Medication Sig   busPIRone (BUSPAR) 10 MG tablet Take 1 tablet (10 mg total) by mouth 3 (three) times daily. For mood control   celecoxib (CELEBREX) 200 MG capsule Take 1 capsule (200 mg total) by mouth 2 (two) times daily.   Cholecalciferol 25 MCG (1000 UT) TBDP Take 1 capsule by mouth daily.   FLUoxetine (PROZAC) 20 MG capsule TAKE 3 CAPSULES BY MOUTH DAILY FOR MOOD CONTROL   meclizine (ANTIVERT) 25 MG tablet Take 1 tablet (25 mg total) by mouth 3 (three) times daily as needed for dizziness.    ROS:  Review of Systems   Eyes:  Positive for blurred vision. Negative for double vision.       (+) floaters  Neurological:  Positive for dizziness. Negative for sensory change and weakness.    Objective:   Today's Vitals: BP 128/90   Pulse 82   Temp 98.2 F (36.8 C) (Oral)   Resp 18   Ht 5\' 3"  (1.6 m)   Wt (!) 316 lb (143.3 kg)   LMP 09/07/2020   SpO2 98%   BMI 55.98 kg/m  Vitals with BMI 10/06/2020 09/22/2020 06/02/2019  Height 5\' 3"  5\' 3"  5\' 3"   Weight 316 lbs 317 lbs 303 lbs  BMI 55.99 88.50 27.74  Systolic 128 786 767  Diastolic 90 80 75  Pulse 82 66 79  Some encounter information is confidential and restricted. Go to Review Flowsheets activity to see all data.     Physical Exam Vitals reviewed.  Constitutional:      General: She is not in acute distress.    Appearance: Normal appearance.  HENT:     Head: Normocephalic and atraumatic.  Neck:     Vascular: No carotid bruit.  Cardiovascular:     Rate and Rhythm: Normal rate and regular rhythm.     Pulses: Normal pulses.     Heart sounds: Normal heart sounds.  Pulmonary:     Effort: Pulmonary effort is normal.     Breath sounds: Normal breath sounds.  Skin:    General: Skin is warm and dry.  Neurological:     General: No focal deficit present.     Mental Status: She is alert and oriented to person, place, and time.     Cranial Nerves: Cranial nerves are intact.     Sensory: Sensation is intact.     Motor: Motor function is intact.     Coordination: Coordination is intact.     Gait: Gait is intact. Tandem walk normal.  Psychiatric:        Mood and Affect: Mood normal.        Behavior: Behavior normal.        Judgment: Judgment normal.         Assessment and Plan   1. Benign paroxysmal positional vertigo, unspecified laterality   2. Arthralgia of left  temporomandibular joint   3. Hyperlipidemia, unspecified hyperlipidemia type   4. Vitamin D deficiency   5. Prediabetes      Plan: 1.  I think her dizziness is most  likely BPPV.  We will prescribe meclizine that she can take as needed.  She was told about red flag symptoms such as double vision, feeling like she is going to pass out, numbness or sensory changes during the episodes and if these were to occur to notify us right away.  She tells me she understands. 2.  Unsure of etiology of the pain in her cheek.  We will try to obtain MRI for further evaluation. 3.-5.  We did discuss her blood work and I recommended she try to focus first on lifestyle changes specifically her diet.  I recommended the Mediterranean diet but generally to focus on whole foods, eat mostly plant-based, and for protein sources choose lean cuts of meat.  I also recommend she start vitamin D3 supplement I recommend she take 1000 IUs of vitamin D3 over-the-counter daily to treat her low vitamin D levels.  She tells me she understands.   Tests ordered Orders Placed This Encounter  Procedures   MR FACE TRIGEMINAL W CONTRAST      Meds ordered this encounter  Medications   meclizine (ANTIVERT) 25 MG tablet    Sig: Take 1 tablet (25 mg total) by mouth 3 (three) times daily as needed for dizziness.    Dispense:  30 tablet    Refill:  1    Order Specific Question:   Supervising Provider    Answer:   Binnie Rail [id number]    Patient to follow-up in 3 to 6 months or sooner as needed.  Ailene Ards, NP

## 2020-10-10 NOTE — Addendum Note (Signed)
Addended by: Jeralyn Ruths E on: 10/10/2020 02:29 PM   Modules accepted: Orders

## 2020-10-20 ENCOUNTER — Ambulatory Visit: Payer: Federal, State, Local not specified - PPO | Admitting: Nurse Practitioner

## 2020-10-22 ENCOUNTER — Encounter: Payer: Self-pay | Admitting: Nurse Practitioner

## 2020-10-26 ENCOUNTER — Other Ambulatory Visit: Payer: Self-pay

## 2020-10-26 ENCOUNTER — Ambulatory Visit
Admission: RE | Admit: 2020-10-26 | Discharge: 2020-10-26 | Disposition: A | Discharge status: Home / Self Care | Payer: Federal, State, Local not specified - PPO | Source: Ambulatory Visit | Attending: Nurse Practitioner | Admitting: Nurse Practitioner

## 2020-10-26 ENCOUNTER — Ambulatory Visit
Admission: RE | Admit: 2020-10-26 | Discharge: 2020-10-26 | Disposition: A | Discharge status: Home / Self Care | Payer: Federal, State, Local not specified - PPO | Source: Ambulatory Visit | Attending: Neurosurgery | Admitting: Neurosurgery

## 2020-10-26 DIAGNOSIS — M4312 Spondylolisthesis, cervical region: Secondary | ICD-10-CM | POA: Diagnosis not present

## 2020-10-26 DIAGNOSIS — M4722 Other spondylosis with radiculopathy, cervical region: Secondary | ICD-10-CM | POA: Diagnosis not present

## 2020-10-26 DIAGNOSIS — M26622 Arthralgia of left temporomandibular joint: Secondary | ICD-10-CM

## 2020-10-26 DIAGNOSIS — R6884 Jaw pain: Secondary | ICD-10-CM | POA: Diagnosis not present

## 2020-10-26 DIAGNOSIS — M48061 Spinal stenosis, lumbar region without neurogenic claudication: Secondary | ICD-10-CM | POA: Diagnosis not present

## 2020-10-26 DIAGNOSIS — R519 Headache, unspecified: Secondary | ICD-10-CM | POA: Diagnosis not present

## 2020-10-26 DIAGNOSIS — M5416 Radiculopathy, lumbar region: Secondary | ICD-10-CM

## 2020-10-26 DIAGNOSIS — M50122 Cervical disc disorder at C5-C6 level with radiculopathy: Secondary | ICD-10-CM | POA: Diagnosis not present

## 2020-10-26 DIAGNOSIS — M5011 Cervical disc disorder with radiculopathy,  high cervical region: Secondary | ICD-10-CM | POA: Diagnosis not present

## 2020-10-26 DIAGNOSIS — M5412 Radiculopathy, cervical region: Secondary | ICD-10-CM

## 2020-10-26 IMAGING — MR MR LUMBAR SPINE W/O CM
4 of 6 series · 23 of 48 positions shown · non-contrast
Comparison: None.

CLINICAL DATA: Lumbar radiculopathy [PV] ([PV]-CM)

EXAM:
MRI LUMBAR SPINE WITHOUT CONTRAST
TECHNIQUE: Multiplanar, multisequence MR imaging of the lumbar spine was
performed. No intravenous contrast was administered.

[Series 7: T1 · sagittal · 4.0mm · 0.73mm/px · 6 of 15 slices shown (1 of 2)]
[im 1/15]
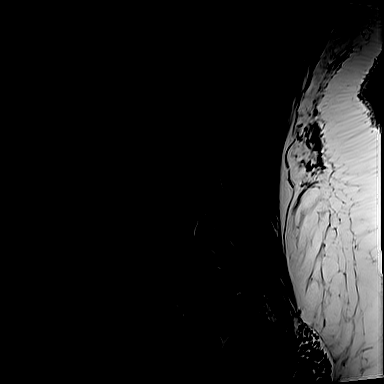
[im 3/15]
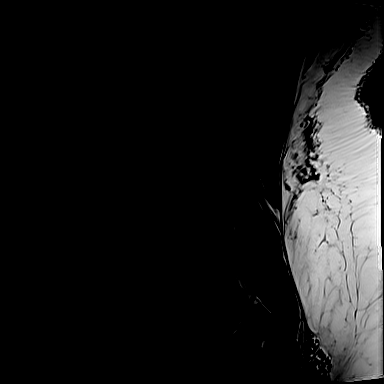
[im 6/15]
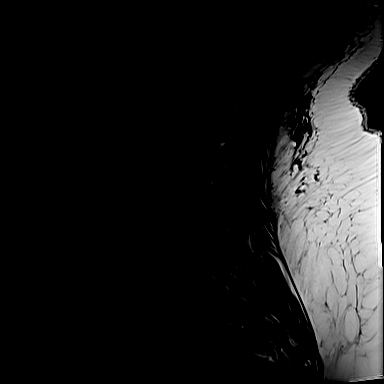
[im 9/15]
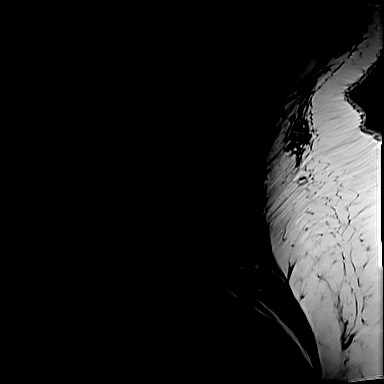
[im 12/15]
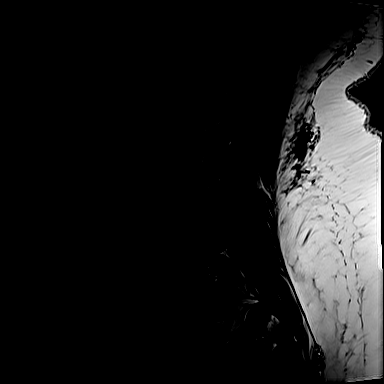
[im 15/15]
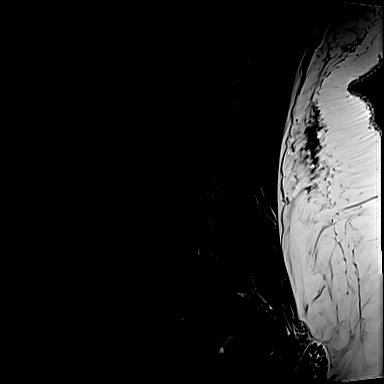

[Series 9: T2 · sagittal · 4.0mm · 0.88mm/px · 5 of 15 slices shown (1 of 2)]
[im 1/15]
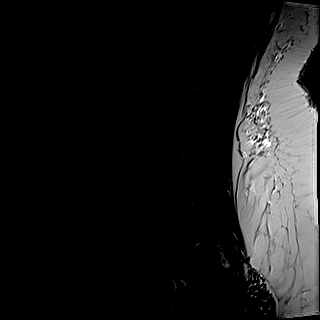
[im 4/15]
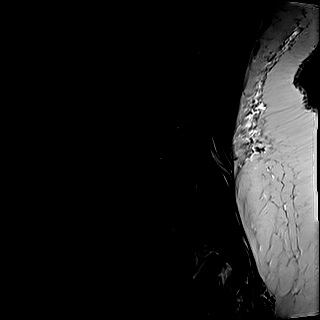
[im 8/15]
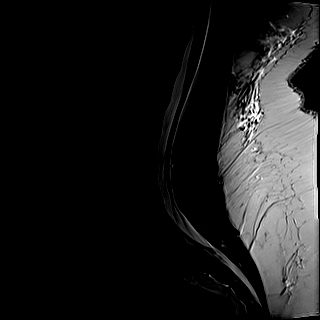
[im 11/15]
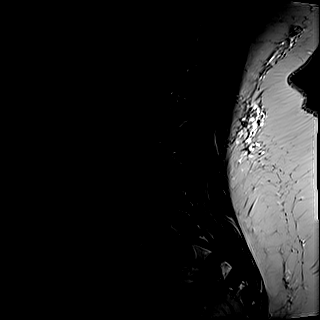
[im 15/15]
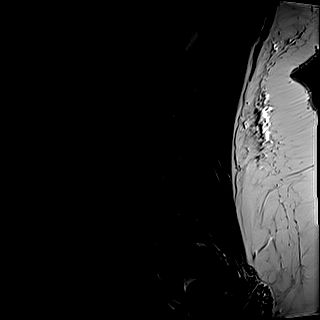

[Series 10: T1 · axial · 4.0mm · 0.35mm/px · z∈[-484,-386]mm · 4 of 11 slices shown (2 of 2)]
[im 1/11]
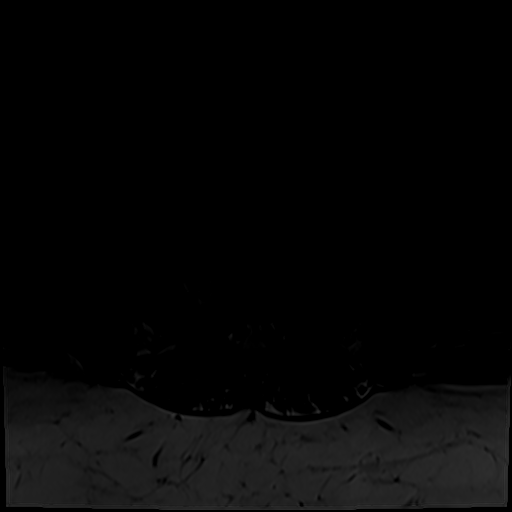
[im 4/11]
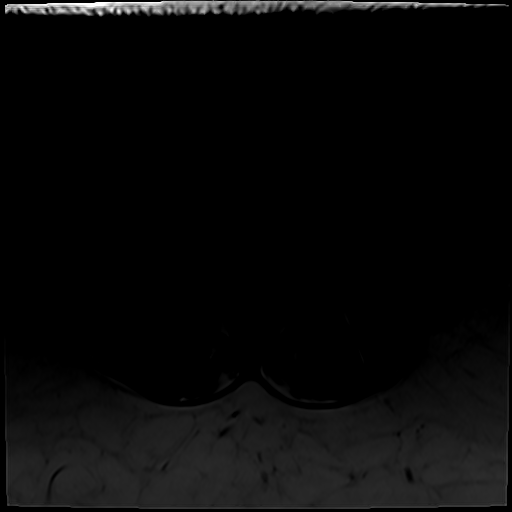
[im 7/11]
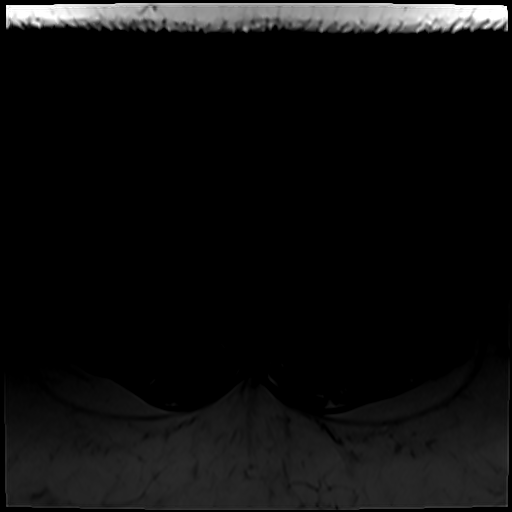
[im 11/11]
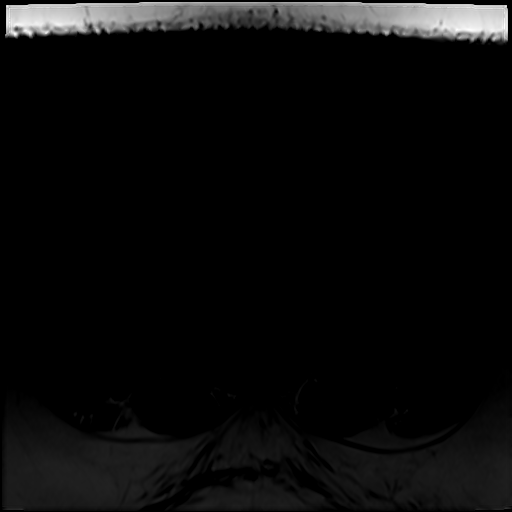

[Series 15: T2 · axial · 4.0mm · 0.35mm/px · z∈[-484,-268]mm · 8 of 40 slices shown (2 of 2)]
[im 1/40]
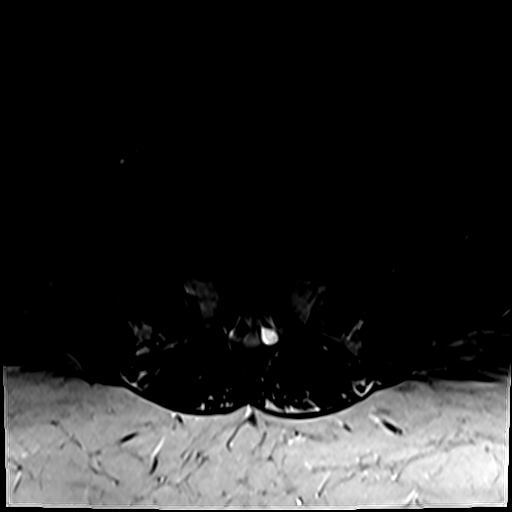
[im 7/40]
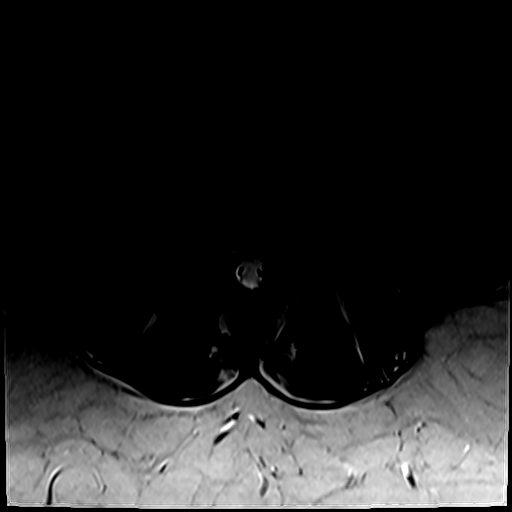
[im 13/40]
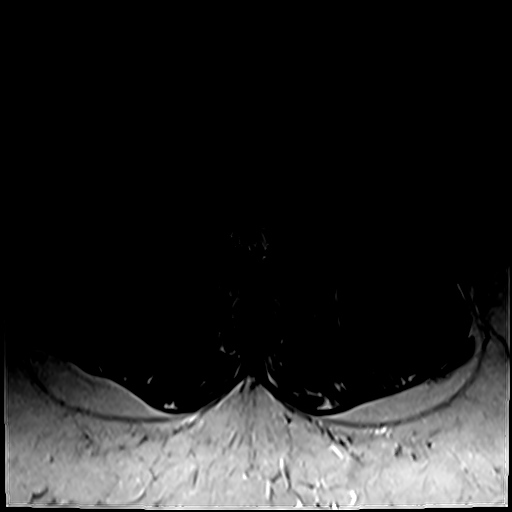
[im 19/40]
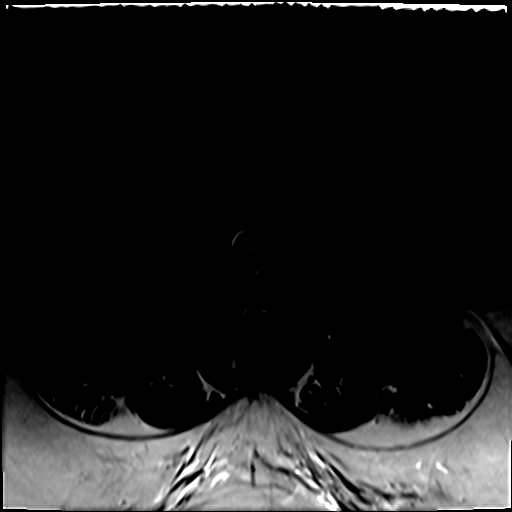
[im 22/40]
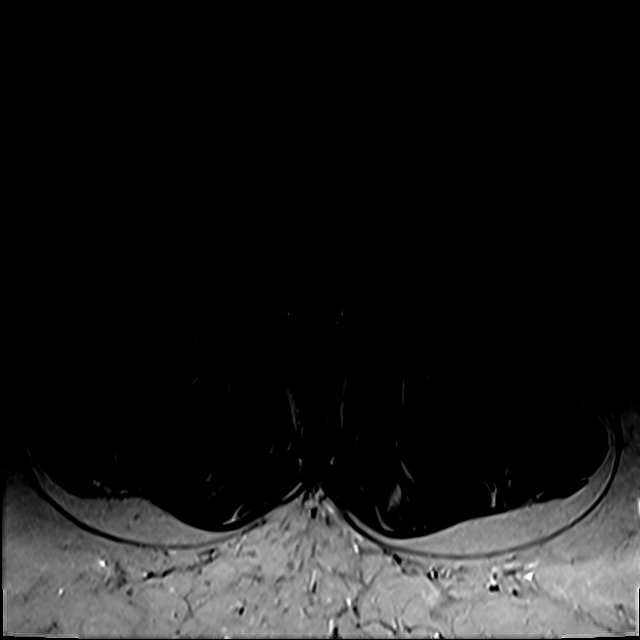
[im 28/40]
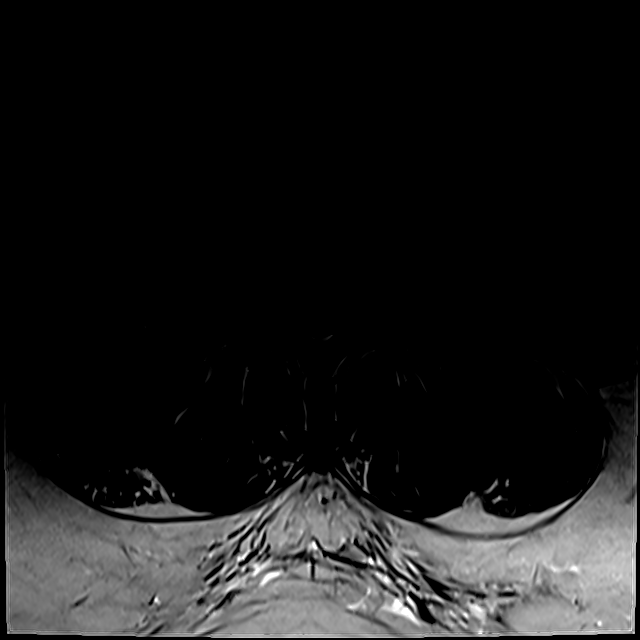
[im 34/40]
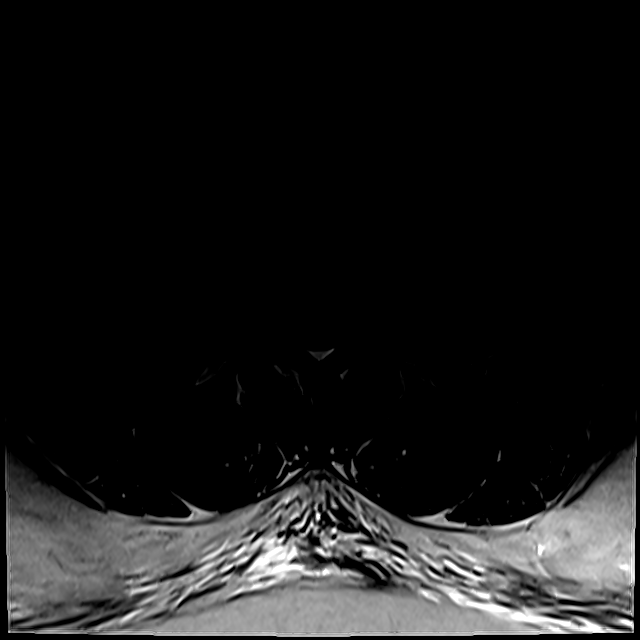
[im 40/40]
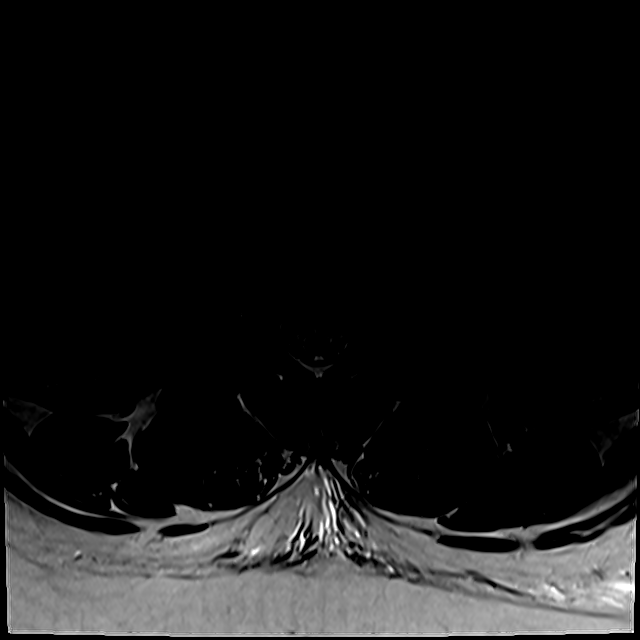

[23 of 48 positions shown; findings below may reference images not displayed]

FINDINGS: Segmentation: Standard segmentation is assumed. Inferior-most fully
formed intervertebral disc labeled L5-S1.

Alignment:  No substantial sagittal subluxation.

Vertebrae: Vertebral body heights are maintained. No visible bone
marrow edema to suggest acute fracture or discitis/osteomyelitis.

Conus medullaris and cauda equina: Conus extends to the T12-L1
level. Conus appears normal.

Paraspinal and other soft tissues: Unremarkable.

Disc levels:

T12-L1: No significant disc protrusion, foraminal stenosis, or canal
stenosis.

L1-L2: No significant disc protrusion, foraminal stenosis, or canal
stenosis.

L2-L3: Mild facet arthropathy without significant stenosis.

L3-L4: Mild facet arthropathy without significant stenosis.

L4-L5: Moderate bilateral facet arthropathy with facet joint
effusions. Resulting mild left foraminal stenosis without
significant canal stenosis.

L5-S1: Moderate right greater than left facet arthropathy without
significant canal or foraminal stenosis.
IMPRESSION: Lower lumbar facet arthropathy, greatest at L4-L5. Mild left
foraminal stenosis at L4-L5. No significant canal stenosis.

## 2020-10-26 IMAGING — MR MR CERVICAL SPINE W/O CM
4 of 5 series · 29 of 48 positions shown · non-contrast
Comparison: [DATE] cervical MRI

CLINICAL DATA: Cervical radiculopathy with infection suspected neck
pain for 15 years with left-sided numbness.

EXAM:
MRI CERVICAL SPINE WITHOUT CONTRAST
TECHNIQUE: Multiplanar, multisequence MR imaging of the cervical spine was
performed. No intravenous contrast was administered.

[Series 5: T2 · sagittal · 3.0mm · 0.55mm/px · 8 of 15 slices shown (1 of 2)]
[im 1/15]
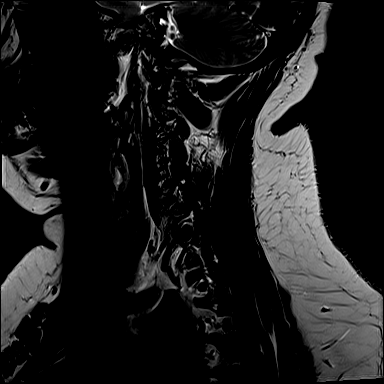
[im 3/15]
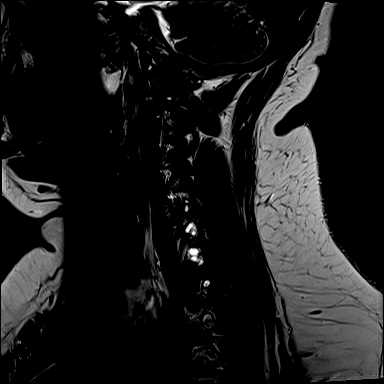
[im 5/15]
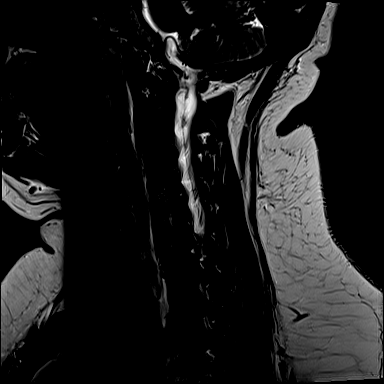
[im 7/15]
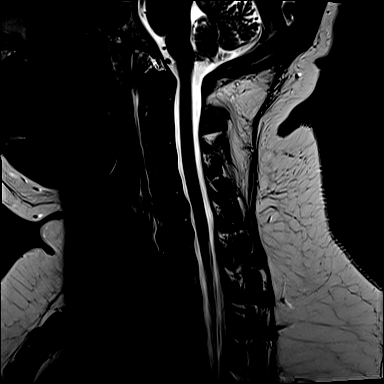
[im 9/15]
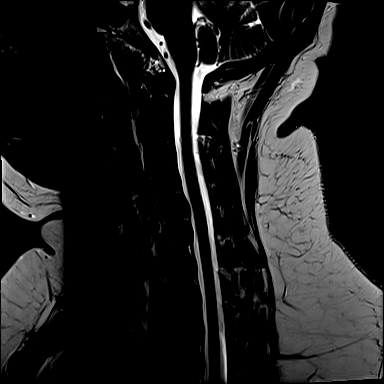
[im 11/15]
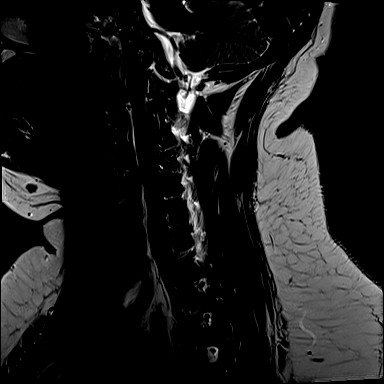
[im 13/15]
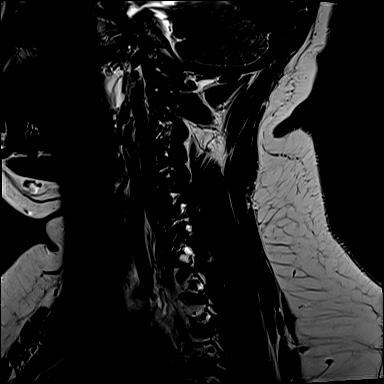
[im 15/15]
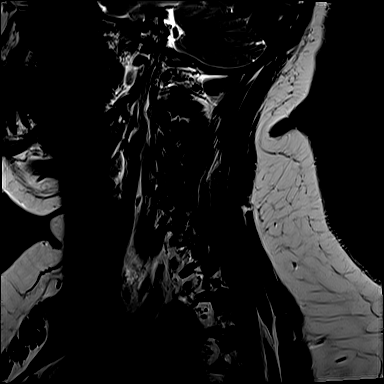

[Series 6: T1 · sagittal · 3.0mm · 0.66mm/px · 7 of 15 slices shown]
[im 1/15]
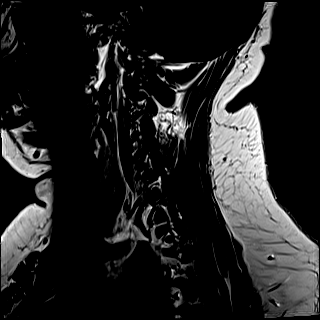
[im 3/15]
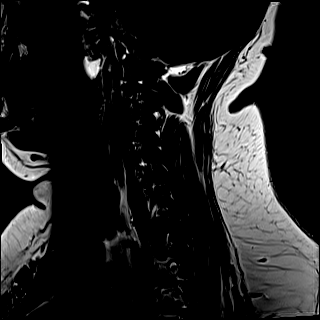
[im 5/15]
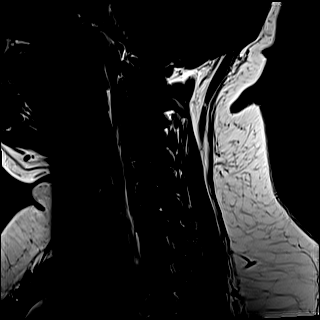
[im 8/15]
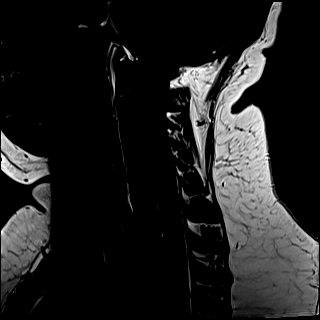
[im 10/15]
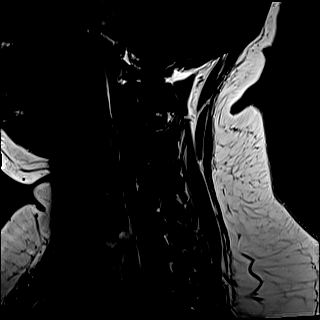
[im 12/15]
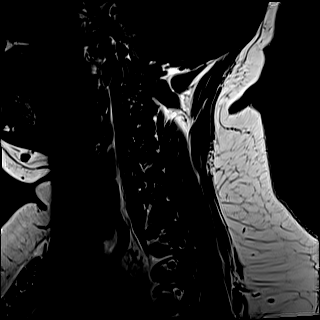
[im 15/15]
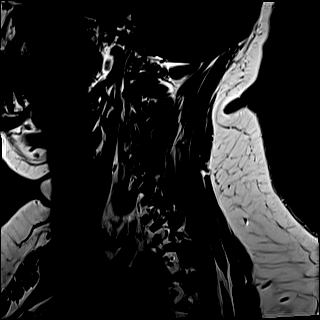

[Series 7: STIR · sagittal · 3.0mm · 0.33mm/px · 5 of 15 slices shown]
[im 1/15]
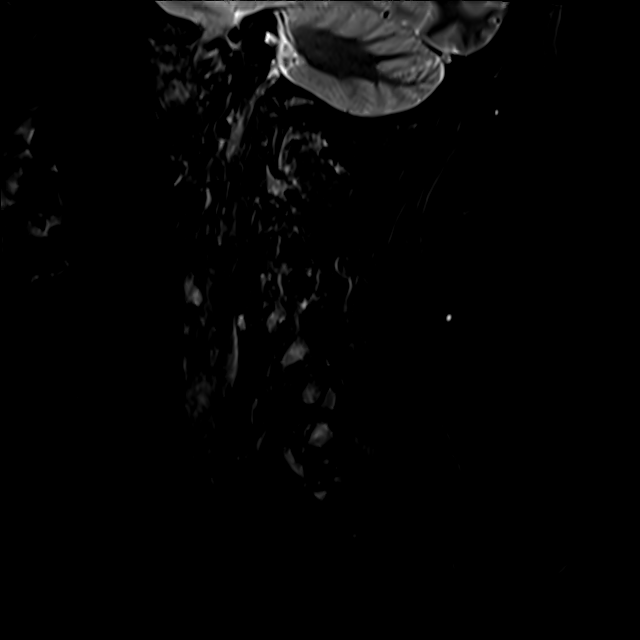
[im 3/15]
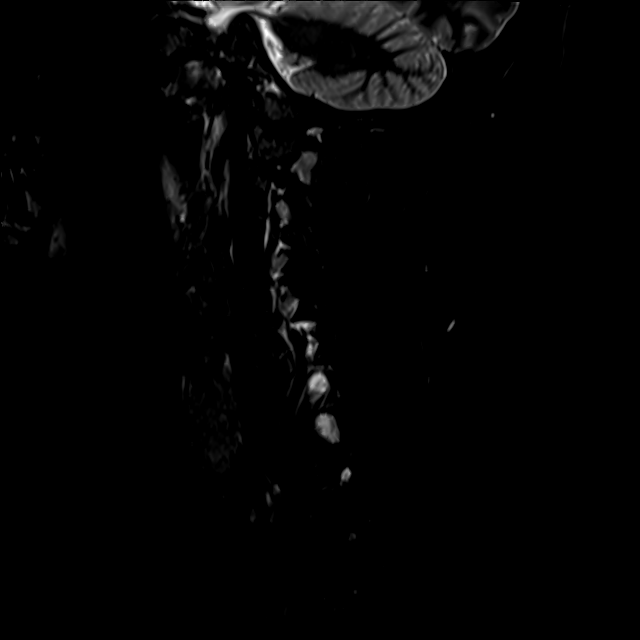
[im 5/15]
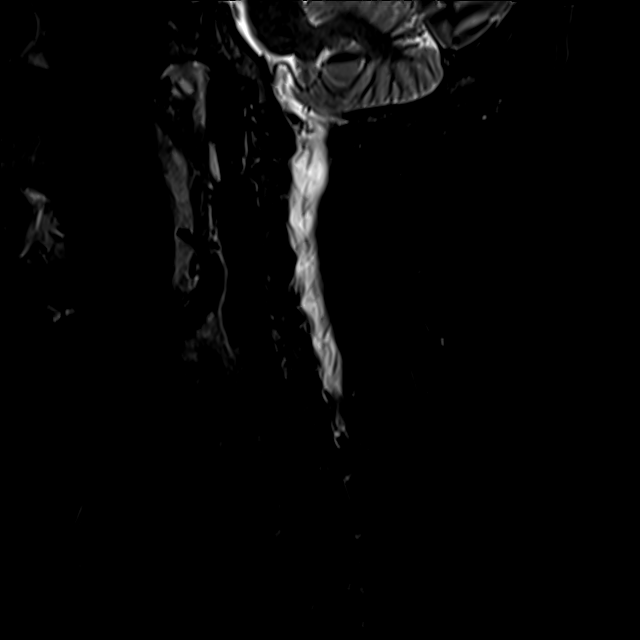
[im 8/15]
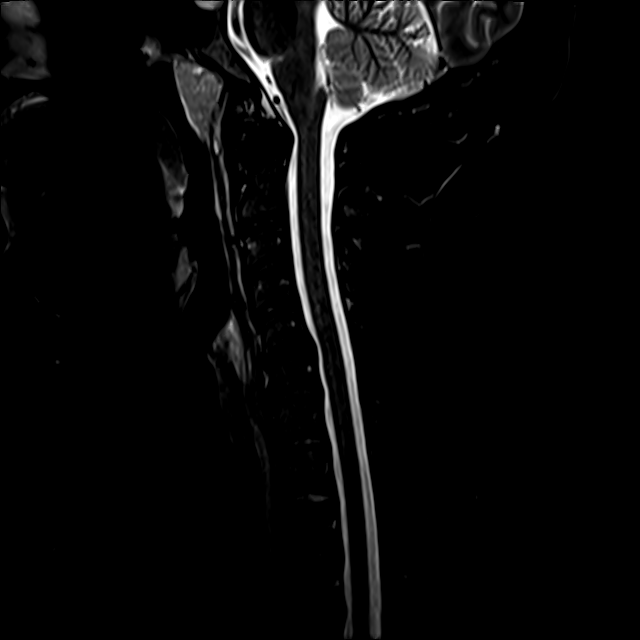
[im 12/15]
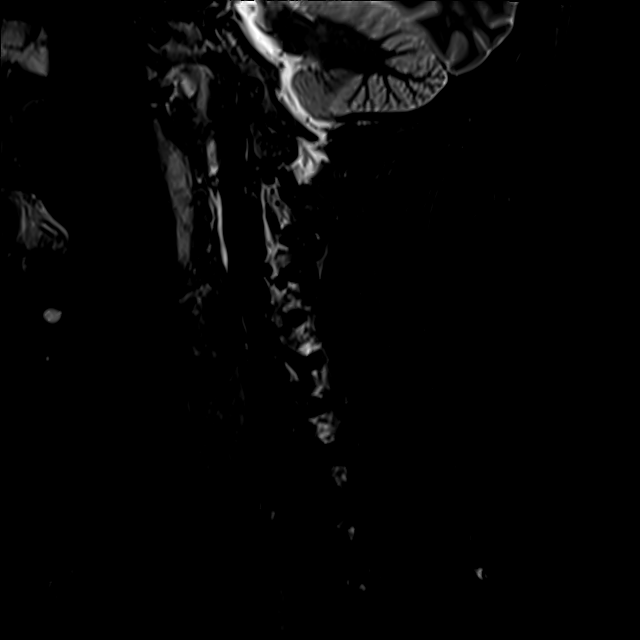

[Series 8: T2 · axial · 3.0mm · 0.50mm/px · z∈[-44,+44]mm · 9 of 28 slices shown (2 of 2)]
[im 1/28]
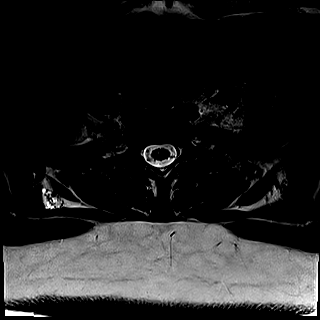
[im 5/28]
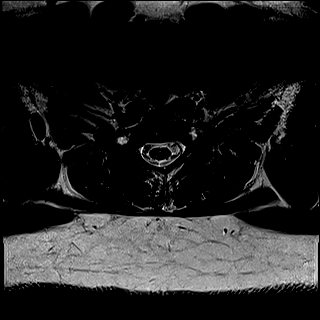
[im 10/28]
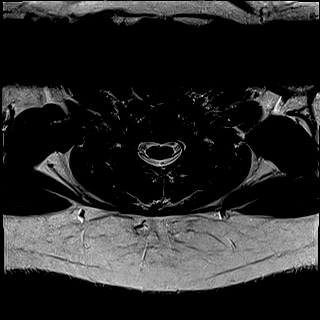
[im 12/28]
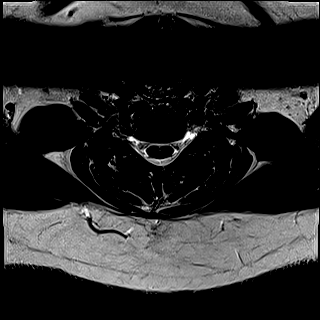
[im 14/28]
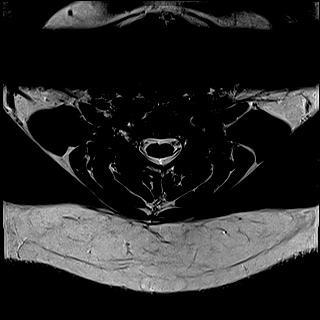
[im 16/28]
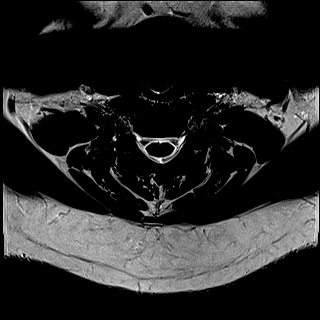
[im 19/28]
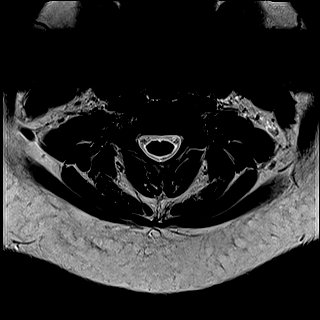
[im 23/28]
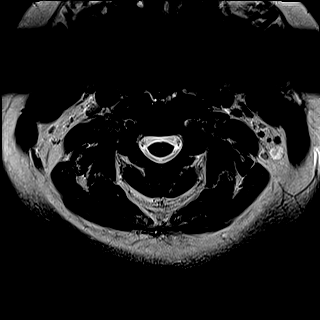
[im 28/28]
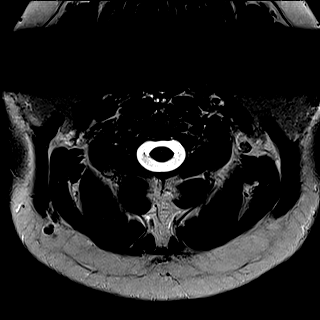

[29 of 48 positions shown; findings below may reference images not displayed]

FINDINGS: Alignment: Slight anterolisthesis at C5-6

Vertebrae: No fracture, evidence of discitis, or bone lesion.

Cord: Normal signal and morphology.

Posterior Fossa, vertebral arteries, paraspinal tissues: Negative.
Bilateral likely incidental root sleeve cysts in the lower cervical
spine.

Disc levels:

C2-3: Unremarkable.

C3-4: Mild foraminal disc bulging/spurring

C4-5: Unremarkable.

C5-6: Minor disc bulging and ventral spurring

C6-7: Minor disc bulging and ventral spurring

C7-T1:Unremarkable.
IMPRESSION: Early degenerative changes without impingement or visible
inflammation.

## 2020-10-26 IMAGING — MR MR FACE/TRIGEMINAL WO/W CM
6 of 8 series · 35 of 48 positions shown · IV contrast (multihance)
Comparison: No pertinent prior exam.

CLINICAL DATA: TMJ pain or limited movement.  Left cheek pain.

EXAM:
MRI FACE TRIGEMINAL WITHOUT AND WITH CONTRAST
TECHNIQUE: Multiplanar, multi-echo pulse sequences of the face and surrounding
structures, including thin-slice imaging of the trigeminal nerves,
were acquired before and after intravenous contrast administration.
CONTRAST:  20mL MULTIHANCE GADOBENATE DIMEGLUMINE 529 MG/ML IV SOLN

[Series 5: T1 · sagittal · 4.0mm · 0.70mm/px · 6 of 30 slices shown (1 of 3)]
[im 1/30]
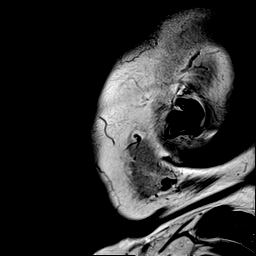
[im 6/30]
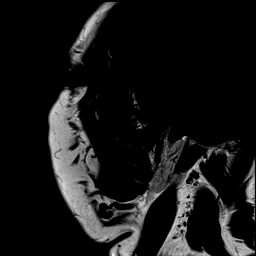
[im 12/30]
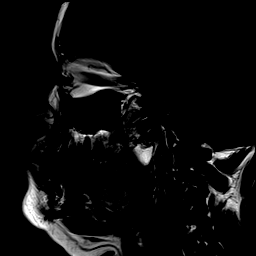
[im 18/30]
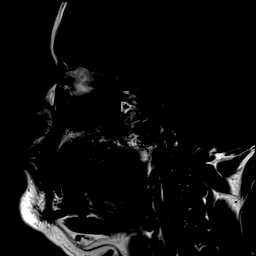
[im 24/30]
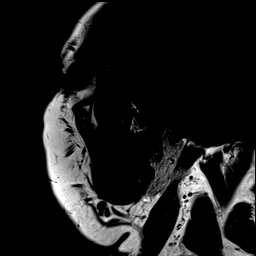
[im 30/30]
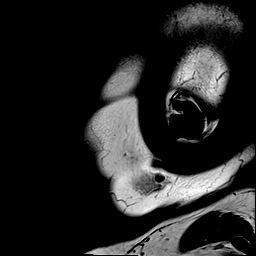

[Series 6: T2 · coronal · 3.0mm · 0.56mm/px · 6 of 34 slices shown]
[im 1/34]
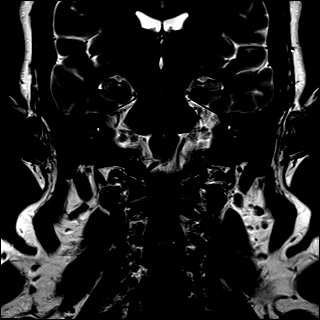
[im 7/34]
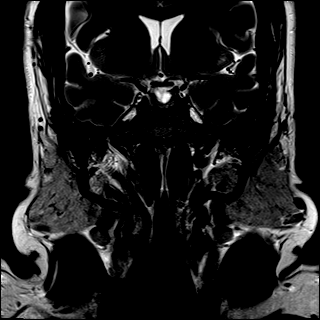
[im 14/34]
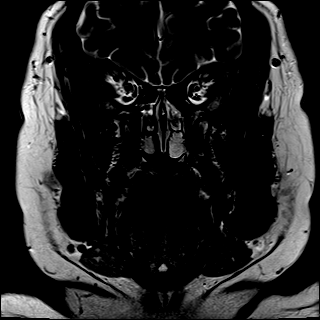
[im 20/34]
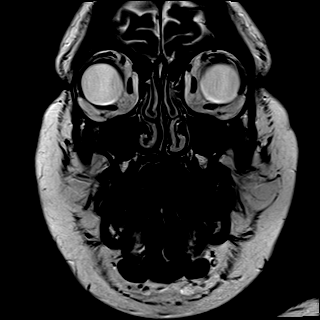
[im 27/34]
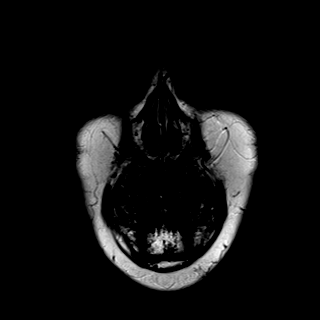
[im 34/34]
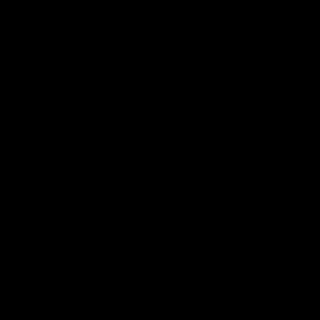

[Series 9: T1 · axial · 3.0mm · 0.31mm/px · z∈[+12,+144]mm · 6 of 41 slices shown (2 of 3)]
[im 1/41]
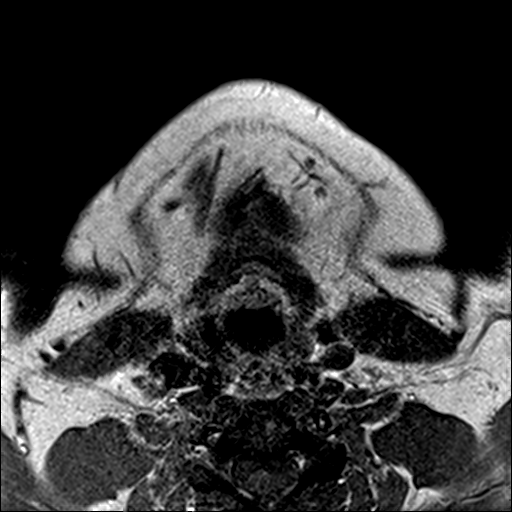
[im 9/41]
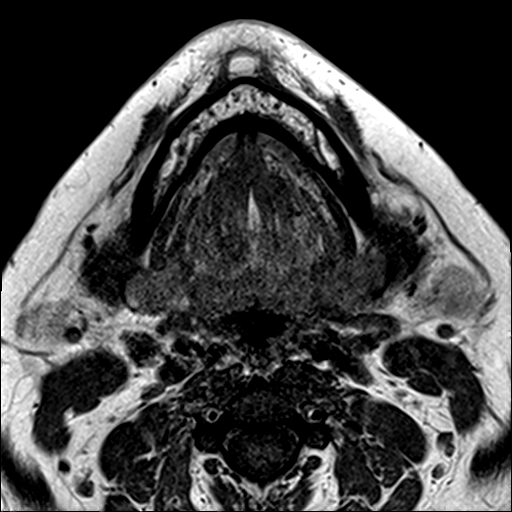
[im 17/41]
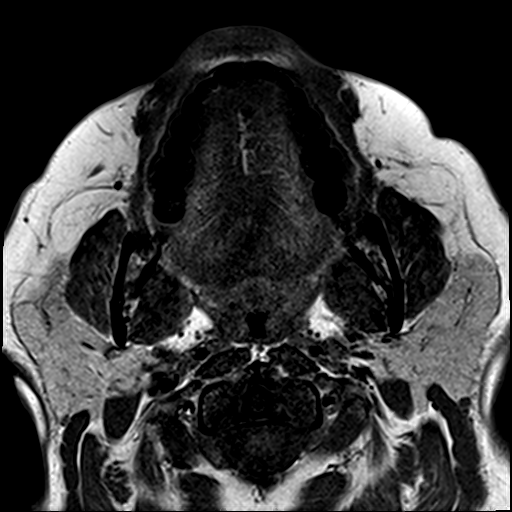
[im 25/41]
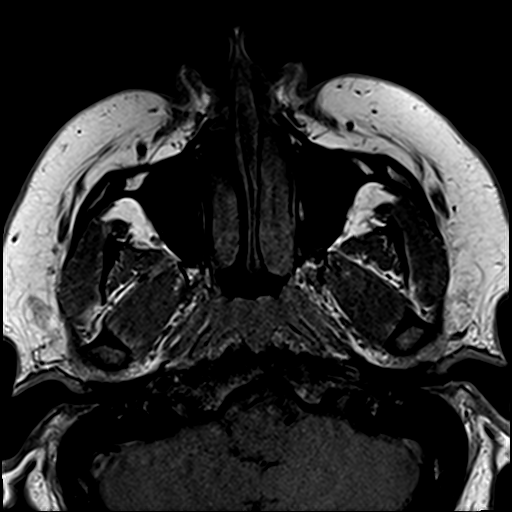
[im 33/41]
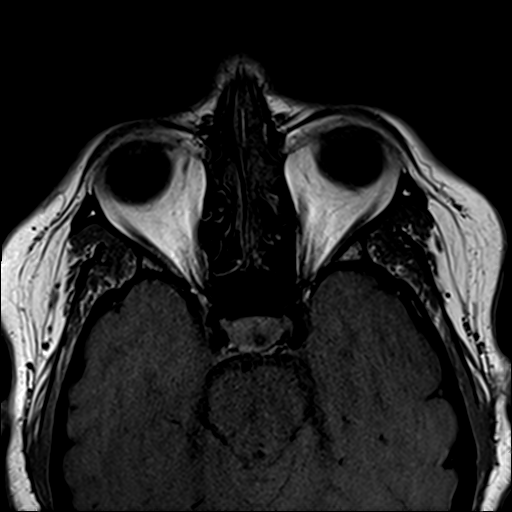
[im 41/41]
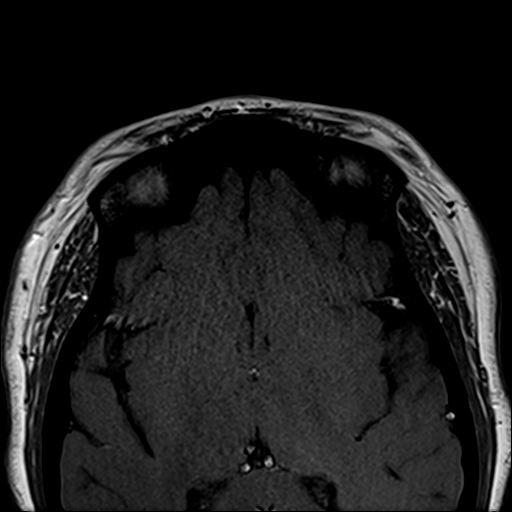

[Series 10: T1 · coronal · 3.0mm · 0.70mm/px · 6 of 41 slices shown (3 of 3)]
[im 1/41]
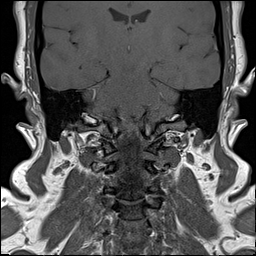
[im 9/41]
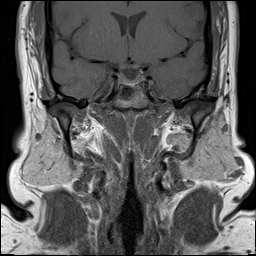
[im 17/41]
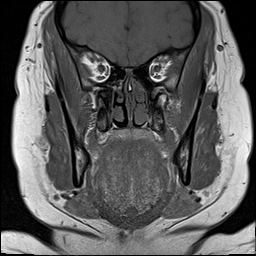
[im 25/41]
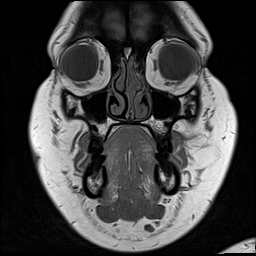
[im 33/41]
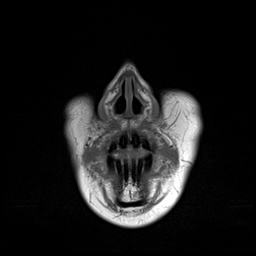
[im 41/41]
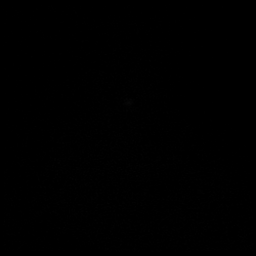

[Series 11: T1 post-contrast · coronal · 3.0mm · 0.70mm/px · 6 of 41 slices shown]
[im 1/41]
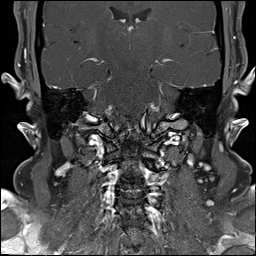
[im 9/41]
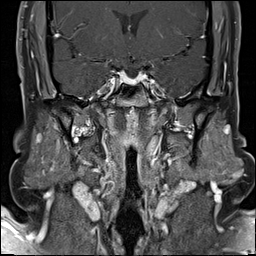
[im 17/41]
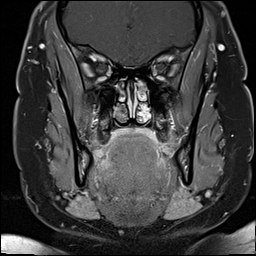
[im 25/41]
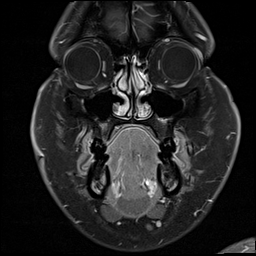
[im 33/41]
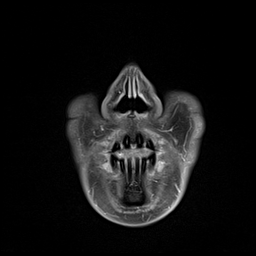
[im 41/41]
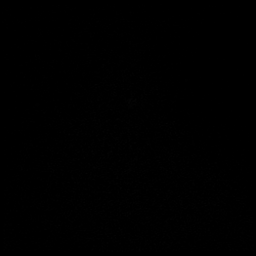

[Series 12: T1 fat-sat post-contrast · axial · 3.0mm · 0.31mm/px · z∈[+12,+118]mm · 5 of 41 slices shown]
[im 1/41]
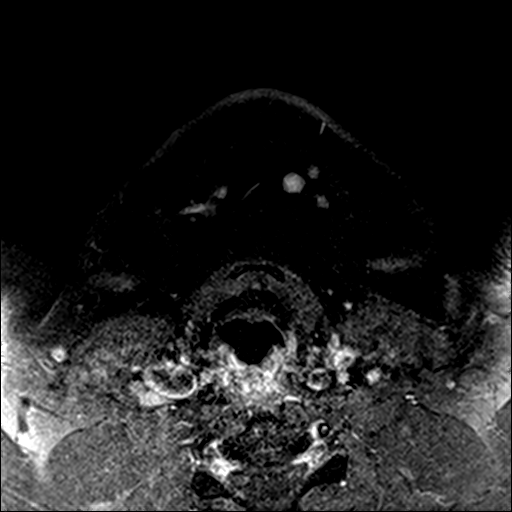
[im 9/41]
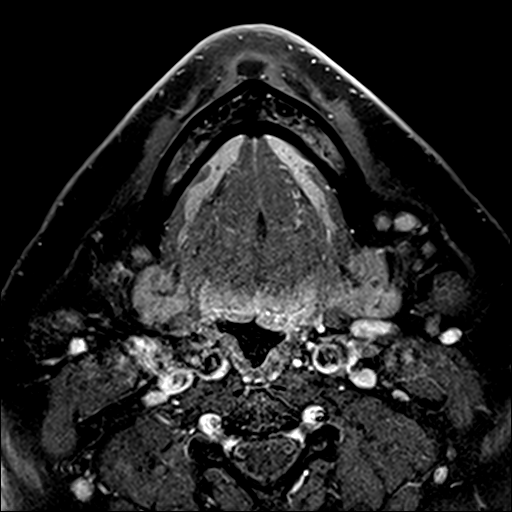
[im 17/41]
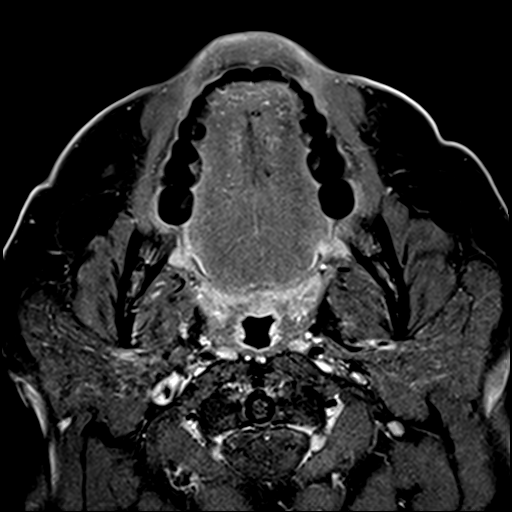
[im 25/41]
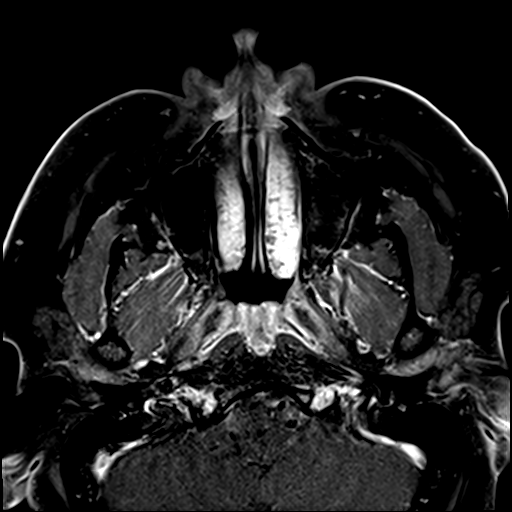
[im 33/41]
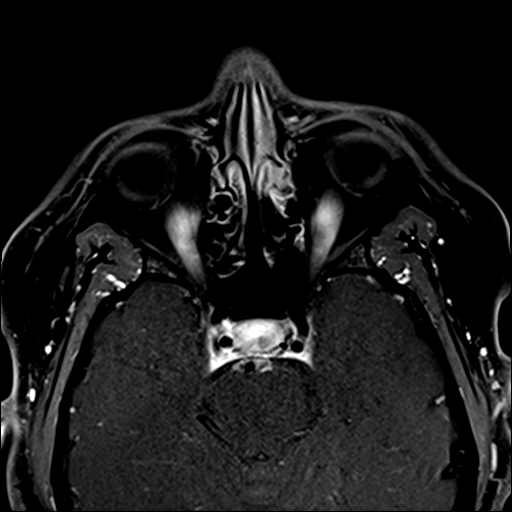

[35 of 48 positions shown; findings below may reference images not displayed]

FINDINGS: Limited intracranial/Trigeminal nerves: No visible acute infarct,
hemorrhage, hydrocephalus, extra-axial collection or mass lesion.
Specifically, no mass is identified along the course of the
trigeminal nerves on thin-slice imaging. No evidence of
neurovascular compression.

Vascular: Normal flow voids.

Sinuses/Orbits: Clear sinuses and negative orbits.

Soft tissues: Normal.

Osseous: Normal marrow signal without focal lesion. No TMJ erosion
or degenerative changes.
IMPRESSION: Negative exam.  No explanation for symptoms.

## 2020-10-26 MED ORDER — GADOBENATE DIMEGLUMINE 529 MG/ML IV SOLN
20.0000 mL | Freq: Once | INTRAVENOUS | Status: AC | PRN
  Administered 2020-10-26: 20 mL via INTRAVENOUS

## 2020-10-26 NOTE — Telephone Encounter (Signed)
Called and made pt an OV.

## 2020-10-28 ENCOUNTER — Ambulatory Visit: Payer: Federal, State, Local not specified - PPO | Admitting: Internal Medicine

## 2020-11-01 ENCOUNTER — Encounter: Payer: Self-pay | Admitting: Internal Medicine

## 2020-11-01 ENCOUNTER — Ambulatory Visit: Payer: Federal, State, Local not specified - PPO | Admitting: Internal Medicine

## 2020-11-01 ENCOUNTER — Other Ambulatory Visit: Payer: Self-pay

## 2020-11-01 VITALS — BP 132/78 | HR 88 | Ht 63.0 in | Wt 318.0 lb

## 2020-11-01 DIAGNOSIS — H6983 Other specified disorders of Eustachian tube, bilateral: Secondary | ICD-10-CM

## 2020-11-01 DIAGNOSIS — E559 Vitamin D deficiency, unspecified: Secondary | ICD-10-CM | POA: Diagnosis not present

## 2020-11-01 DIAGNOSIS — R030 Elevated blood-pressure reading, without diagnosis of hypertension: Secondary | ICD-10-CM | POA: Diagnosis not present

## 2020-11-01 DIAGNOSIS — R7303 Prediabetes: Secondary | ICD-10-CM

## 2020-11-01 DIAGNOSIS — J309 Allergic rhinitis, unspecified: Secondary | ICD-10-CM

## 2020-11-01 MED ORDER — TRIAMCINOLONE ACETONIDE 55 MCG/ACT NA AERO: 2.0000 | INHALATION_SPRAY | Freq: Every day | NASAL | 12 refills | Status: DC

## 2020-11-01 MED ORDER — CETIRIZINE HCL 10 MG PO TABS: 10.0000 mg | ORAL_TABLET | Freq: Every day | ORAL | 11 refills | Status: DC

## 2020-11-01 MED ORDER — CHOLECALCIFEROL 50 MCG (2000 UT) PO TABS: ORAL_TABLET | ORAL | 99 refills | Status: DC

## 2020-11-01 MED ORDER — GUAIFENESIN ER 600 MG PO TB12: 1200.0000 mg | ORAL_TABLET | Freq: Two times a day (BID) | ORAL | 5 refills | Status: DC | PRN

## 2020-11-01 NOTE — Progress Notes (Signed)
Patient ID: [name], female   DOB: 09-Mar-1980, 40 y.o.   MRN: [id number]        Chief Complaint: follow up dizziness       HPI:  [name] is a 40 y.o. female here with c/o right > left ear fullness, popping, crackling with sometimes muffled hearing, sometimes worse with head movement, nothing else seems to make better or worse, x 2 wks.  Denies HA, fever, chills, sinus pain, ST, cough and Pt denies chest pain, increased sob or doe, wheezing, orthopnea, PND, increased LE swelling, palpitations, dizziness or syncope.   Pt denies polydipsia, polyuria, or new focal neuro s/s.  Aunt has fibromuscular dysplasia and wondering if she is at risk.  BP < 140/90 at home but borderline today.  Not taking Vit D. Does have several wks ongoing nasal allergy symptoms with clearish congestion, itch and sneezing, without fever, pain, ST, cough, swelling or wheezing.  Pt denies recent wt loss, night sweats, loss of appetite, or other constitutional symptoms  Denies worsening depressive symptoms, suicidal ideation, or panic; has ongoing anxiety  Wt Readings from Last 3 Encounters:  11/01/20 (!) 318 lb (144.2 kg)  10/06/20 (!) 316 lb (143.3 kg)  09/22/20 (!) 317 lb (143.8 kg)   BP Readings from Last 3 Encounters:  11/01/20 132/78  10/06/20 128/90  09/22/20 118/80         Past Medical History:  Diagnosis Date   Anxiety    Back pain    Bilateral swelling of feet    Chest pain    Cluster headaches    Depression    Eczema    GERD (gastroesophageal reflux disease)    Gestational diabetes 2012, 2016   Hyperlipidemia    Hypertension    no meds currently   Joint pain    Osteoarthritis    PCOS (polycystic ovarian syndrome)    Pre-diabetes    Sciatica    Shortness of breath    Sleep apnea    Snoring    sleep study 02/2012 with min AHI events   Vitamin D deficiency    Past Surgical History:  Procedure Laterality Date   CESAREAN SECTION  09 & 12   x's 2   CESAREAN SECTION WITH BILATERAL  TUBAL LIGATION Bilateral 12/18/2014   Procedure: REPEAT CESAREAN SECTION WITH BILATERAL TUBAL LIGATION;  Surgeon: Crawford Givens, MD;  Location: [facility];  Service: Obstetrics;  Laterality: Bilateral;   COLPOSCOPY     DENTAL SURGERY     Ganglion removal from (L) wrist  1998   LAPAROSCOPIC UNILATERAL SALPINGECTOMY Left 01/26/2015   Procedure: LAPAROSCOPIC UNILATERAL SALPINGECTOMY;  Surgeon: Crawford Givens, MD;  Location: [facility];  Service: Gynecology;  Laterality: Left;   WISDOM TOOTH EXTRACTION      reports that she has been smoking cigarettes. She has a 3.75 pack-year smoking history. She has never used smokeless tobacco. She reports current alcohol use. She reports that she does not use drugs. family history includes Alcohol abuse in her father, mother, and another family member; Alcoholism in her father; Anxiety disorder in her mother and sister; Arthritis in her father, mother, and another family member; Breast cancer in her paternal aunt; Cancer (age of onset: 80) in her mother; Coronary artery disease (age of onset: 56) in her mother; Depression in her father and mother; Diabetes in her maternal grandmother and mother; Drug abuse in her father; Heart disease in her father and maternal grandmother; Hyperlipidemia in her father and paternal grandfather; Hypertension  in her father and paternal grandfather; Kidney disease in her father; Mental illness in her father and mother; Obesity in her mother; Stroke in her father and maternal grandfather. Allergies  Allergen Reactions   Codeine     STOMACH CRAMPING   Doxycycline Nausea And Vomiting   Current Outpatient Medications on File Prior to Visit  Medication Sig Dispense Refill   busPIRone (BUSPAR) 10 MG tablet Take 1 tablet (10 mg total) by mouth 3 (three) times daily. For mood control 270 tablet 1   celecoxib (CELEBREX) 200 MG capsule Take 1 capsule (200 mg total) by mouth 2 (two) times daily. 180 capsule 1   FLUoxetine (PROZAC) 20 MG capsule TAKE 3  CAPSULES BY MOUTH DAILY FOR MOOD CONTROL 270 capsule 1   meclizine (ANTIVERT) 25 MG tablet Take 1 tablet (25 mg total) by mouth 3 (three) times daily as needed for dizziness. 30 tablet 1   No current facility-administered medications on file prior to visit.        ROS:  All others reviewed and negative.  Objective        PE:  BP 132/78 (BP Location: Left Arm, Patient Position: Sitting, Cuff Size: Large)   Pulse 88   Ht 5\' 3"  (1.6 m)   Wt (!) 318 lb (144.2 kg)   SpO2 98%   BMI 56.33 kg/m                 Constitutional: Pt appears in NAD               HENT: Head: NCAT.                Right Ear: External ear normal.                 Left Ear: External ear normal.  Bilat tm's with mild erythema.  Max sinus areas none tender.  Pharynx with mild erythema, no exudate               Eyes: . Pupils are equal, round, and reactive to light. Conjunctivae and EOM are normal               Nose: without d/c or deformity               Neck: Neck supple. Gross normal ROM               Cardiovascular: Normal rate and regular rhythm.                 Pulmonary/Chest: Effort normal and breath sounds without rales or wheezing.                Abd:  Soft, NT, ND, + BS, no organomegaly               Neurological: Pt is alert. At baseline orientation, motor grossly intact               Skin: Skin is warm. No rashes, no other new lesions, LE edema - none               Psychiatric: Pt behavior is normal without agitation   Micro: none  Cardiac tracings I have personally interpreted today:  none  Pertinent Radiological findings (summarize): none   Lab Results  Component Value Date   WBC 7.4 09/22/2020   HGB 12.9 09/22/2020   HCT 39.1 09/22/2020   PLT 276.0 09/22/2020   GLUCOSE 111 (H) 09/22/2020   CHOL 224 (H)  09/22/2020   TRIG 144.0 09/22/2020   HDL 36.20 (L) 09/22/2020   LDLDIRECT 111.5 09/09/2012   LDLCALC 159 (H) 09/22/2020   ALT 21 09/22/2020   AST 15 09/22/2020   NA 137 09/22/2020   K 4.2  09/22/2020   CL 102 09/22/2020   CREATININE 0.84 09/22/2020   BUN 11 09/22/2020   CO2 29 09/22/2020   TSH 3.38 09/22/2020   HGBA1C 6.1 09/22/2020   Assessment/Plan:  [name] is a 40 y.o. Other or two or more races [6] female with  has a past medical history of Anxiety, Back pain, Bilateral swelling of feet, Chest pain, Cluster headaches, Depression, Eczema, GERD (gastroesophageal reflux disease), Gestational diabetes (2012, 2016), Hyperlipidemia, Hypertension, Joint pain, Osteoarthritis, PCOS (polycystic ovarian syndrome), Pre-diabetes, Sciatica, Shortness of breath, Sleep apnea, Snoring, and Vitamin D deficiency.  Blood pressure elevated without history of HTN Borderline today only, ok to continue to follow, will hold on renal artery u/s for now  Acute dysfunction of Eustachian tube, bilateral Likely due to ongoing allergies, for mucinex bid prn  Vitamin D deficiency Last vitamin D Lab Results  Component Value Date   VD25OH 21.95 (L) 09/22/2020   Low, to start oral replacement   Prediabetes Lab Results  Component Value Date   HGBA1C 6.1 09/22/2020   Stable, pt to continue current medical treatment  - diet  Allergic rhinitis Mild, for restart zyrtec, nasacort  Followup: No follow-ups on file.  Cathlean Cower, MD 11/08/2020 4:33 PM [facility]

## 2020-11-01 NOTE — Patient Instructions (Signed)
Please take all new medication as prescribed - the zyrtec, nasacort, and mucinex as needed  Please take OTC Vitamin D3 at 2000 units per day, indefinitely  Please continue all other medications as before, and refills have been done if requested.  Please have the pharmacy call with any other refills you may need.  Please continue your efforts at being more active, low cholesterol diet, and weight control.  Please keep your appointments with your specialists as you may have planned

## 2020-11-08 ENCOUNTER — Encounter: Payer: Self-pay | Admitting: Internal Medicine

## 2020-11-08 DIAGNOSIS — H6983 Other specified disorders of Eustachian tube, bilateral: Secondary | ICD-10-CM | POA: Insufficient documentation

## 2020-11-08 DIAGNOSIS — R030 Elevated blood-pressure reading, without diagnosis of hypertension: Secondary | ICD-10-CM | POA: Insufficient documentation

## 2020-11-08 NOTE — Assessment & Plan Note (Addendum)
Mild, for restart zyrtec, nasacort

## 2020-11-08 NOTE — Assessment & Plan Note (Signed)
Borderline today only, ok to continue to follow, will hold on renal artery u/s for now

## 2020-11-08 NOTE — Assessment & Plan Note (Signed)
Lab Results  Component Value Date   HGBA1C 6.1 09/22/2020   Stable, pt to continue current medical treatment  - diet

## 2020-11-08 NOTE — Assessment & Plan Note (Signed)
Last vitamin D Lab Results  Component Value Date   VD25OH 21.95 (L) 09/22/2020   Low, to start oral replacement

## 2020-11-08 NOTE — Assessment & Plan Note (Signed)
Likely due to ongoing allergies, for mucinex bid prn

## 2020-11-14 DIAGNOSIS — G4733 Obstructive sleep apnea (adult) (pediatric): Secondary | ICD-10-CM | POA: Diagnosis not present

## 2020-12-05 DIAGNOSIS — R030 Elevated blood-pressure reading, without diagnosis of hypertension: Secondary | ICD-10-CM | POA: Diagnosis not present

## 2020-12-05 DIAGNOSIS — M549 Dorsalgia, unspecified: Secondary | ICD-10-CM | POA: Diagnosis not present

## 2020-12-05 DIAGNOSIS — Z6841 Body Mass Index (BMI) 40.0 and over, adult: Secondary | ICD-10-CM | POA: Diagnosis not present

## 2021-01-04 ENCOUNTER — Other Ambulatory Visit: Payer: Self-pay

## 2021-01-04 ENCOUNTER — Ambulatory Visit: Payer: Federal, State, Local not specified - PPO | Admitting: Podiatry

## 2021-01-04 ENCOUNTER — Encounter: Payer: Self-pay | Admitting: Podiatry

## 2021-01-04 DIAGNOSIS — B351 Tinea unguium: Secondary | ICD-10-CM

## 2021-01-04 MED ORDER — TERBINAFINE HCL 250 MG PO TABS: 250.0000 mg | ORAL_TABLET | Freq: Every day | ORAL | 2 refills | Status: AC

## 2021-01-04 NOTE — Progress Notes (Signed)
°  Subjective:  Patient ID: [name], female    DOB: 07-Apr-1980,   MRN: [id number]  Chief Complaint  Patient presents with   Nail Problem    BL hallux toenails not growing and not healthy x 1 yr ago dropped boxes on toes and were bruised - Tx: none    40 y.o. female presents for bilatearl hallux nails have been growing in thickened and discolored. Relates she is concerned for fungus. Relates she dropped something on her toes about a year ago. . Denies any other pedal complaints. Denies n/v/f/c.   Past Medical History:  Diagnosis Date   Anxiety    Back pain    Bilateral swelling of feet    Chest pain    Cluster headaches    Depression    Eczema    GERD (gastroesophageal reflux disease)    Gestational diabetes 2012, 2016   Hyperlipidemia    Hypertension    no meds currently   Joint pain    Osteoarthritis    PCOS (polycystic ovarian syndrome)    Pre-diabetes    Sciatica    Shortness of breath    Sleep apnea    Snoring    sleep study 02/2012 with min AHI events   Vitamin D deficiency     Objective:  Physical Exam: Vascular: DP/PT pulses 2/4 bilateral. CFT <3 seconds. Normal hair growth on digits. No edema.  Skin. No lacerations or abrasions bilateral feet. Hallux nails thickened and discolored.  Musculoskeletal: MMT 5/5 bilateral lower extremities in DF, PF, Inversion and Eversion. Deceased ROM in DF of ankle joint.  Neurological: Sensation intact to light touch.   Assessment:  No diagnosis found.   Plan:  Patient was evaluated and treated and all questions answered. -Examined patient -Discussed treatment options for painful dystrophic nails  -Discussed fungal nail treatment options including oral, topical, and laser treatments.  -LFTS normal limits  -Prescription for lamisil provdied  Follow-up in 3 months for nail evaluation.    Lorenda Peck, DPM

## 2021-02-14 DIAGNOSIS — G4733 Obstructive sleep apnea (adult) (pediatric): Secondary | ICD-10-CM | POA: Diagnosis not present

## 2021-03-09 ENCOUNTER — Encounter (HOSPITAL_COMMUNITY): Payer: Self-pay | Admitting: Psychiatry

## 2021-03-09 ENCOUNTER — Telehealth (HOSPITAL_BASED_OUTPATIENT_CLINIC_OR_DEPARTMENT_OTHER): Payer: Federal, State, Local not specified - PPO | Admitting: Psychiatry

## 2021-03-09 ENCOUNTER — Other Ambulatory Visit: Payer: Self-pay

## 2021-03-09 DIAGNOSIS — F411 Generalized anxiety disorder: Secondary | ICD-10-CM

## 2021-03-09 DIAGNOSIS — F332 Major depressive disorder, recurrent severe without psychotic features: Secondary | ICD-10-CM

## 2021-03-09 MED ORDER — FLUOXETINE HCL 20 MG PO CAPS: ORAL_CAPSULE | ORAL | 1 refills | Status: DC

## 2021-03-09 MED ORDER — BUSPIRONE HCL 10 MG PO TABS: 10.0000 mg | ORAL_TABLET | Freq: Three times a day (TID) | ORAL | 1 refills | Status: DC

## 2021-03-09 NOTE — Progress Notes (Signed)
.Virtual Visit via Video Note ? ?I connected with [name] on 03/09/21 at  8:30 AM EST by a video enabled telemedicine application and verified that I am speaking with the correct person using two identifiers. ? ?Location: ?Patient: home ?Provider: office ?  ?I discussed the limitations of evaluation and management by telemedicine and the availability of in person appointments. The patient expressed understanding and agreed to proceed. ? ?History of Present Illness: ?"Pretty good". She is starting to get some of motivation back and has been working on setting up her house. [name] enjoys arts and crafts and has been more interested in engaging in her hobbies. She has made a goal to finish unpacking before working on hobbies. She denies depression in the last 2 weeks. [name] felt down 1 night about 1 month ago after reading a sad story. She is active and social. Her sleep and appetite are good. [name] is working on her food choices and trying to be healthy. She denies hopelessness. She denies SI/HI. Her anxiety is mild and mostly situational.  ?  ? ? ?Observations/Objective: ?Psychiatric Specialty Exam: ?ROS  ?unknown if currently breastfeeding.There is no height or weight on file to calculate BMI.  ?General Appearance: Casual  ?Eye Contact:  Good  ?Speech:  Clear and Coherent and Normal Rate  ?Volume:  Normal  ?Mood:  Euthymic  ?Affect:  Full Range  ?Thought Process:  Goal Directed, Linear, and Descriptions of Associations: Intact  ?Orientation:  Full (Time, Place, and Person)  ?Thought Content:  Logical  ?Suicidal Thoughts:  No  ?Homicidal Thoughts:  No  ?Memory:  Immediate;   Good  ?Judgement:  Good  ?Insight:  Good  ?Psychomotor Activity:  Normal  ?Concentration:  Concentration: Good  ?Recall:  Good  ?Fund of Knowledge:  Good  ?Language:  Good  ?Akathisia:  No  ?Handed:  Right  ?AIMS (if indicated):     ?Assets:  Communication Skills ?Desire for Improvement ?Financial  Resources/Insurance ?Housing ?Intimacy ?Leisure Time ?Resilience ?Social Support ?Talents/Skills ?Transportation ?Vocational/Educational  ?ADL's:  Intact  ?Cognition:  WNL  ?Sleep:     ? ? ? ?Assessment and Plan: ?Depression screen [facility] 2/9 03/09/2021 10/06/2020 09/29/2020 09/22/2020 05/05/2020  ?Decreased Interest 0 0 0 0 0  ?Down, Depressed, Hopeless 0 0 0 0 1  ?PHQ - 2 Score 0 0 0 0 1  ?Altered sleeping - 0 - - 0  ?Tired, decreased energy - 0 - - 0  ?Change in appetite - 0 - - 0  ?Feeling bad or failure about yourself  - 0 - - 0  ?Trouble concentrating - 0 - - 0  ?Moving slowly or fidgety/restless - 0 - - 0  ?Suicidal thoughts - 0 - - 0  ?PHQ-9 Score - 0 - - 1  ?Difficult doing work/chores - - - - Not difficult at all  ?Some recent data might be hidden  ? ? ?Flowsheet Row Video Visit from 03/09/2021 in [facility] Video Visit from 09/29/2020 in [facility] Video Visit from 05/05/2020 in [facility]  ?C-SSRS RISK CATEGORY No Risk No Risk No Risk  ? ?  ? ? ?Referred to the l-800- quitline for smoking cessation ? ?1. GAD (generalized anxiety disorder) ?- busPIRone (BUSPAR) 10 MG tablet; Take 1 tablet (10 mg total) by mouth 3 (three) times daily. For mood control  Dispense: 270 tablet; Refill: 1 ?- FLUoxetine (PROZAC) 20 MG capsule; TAKE 3 CAPSULES BY MOUTH DAILY FOR  MOOD CONTROL  Dispense: 270 capsule; Refill: 1 ? 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 ? ? ? ?Follow Up Instructions: ?In 5-6 months or sooner if needed ?  ?I discussed the assessment and treatment plan with the patient. The patient was provided an opportunity to ask questions and all were answered. The patient agreed with the plan and demonstrated an understanding of the  instructions. ?  ?The patient was advised to call back or seek an in-person evaluation if the symptoms worsen or if the condition fails to improve as anticipated. ? ?I provided 17 minutes of non-face-to-face time during this encounter. ? ? ?Charlcie Cradle, MD ? ?

## 2021-03-16 ENCOUNTER — Other Ambulatory Visit: Payer: Self-pay

## 2021-03-16 ENCOUNTER — Encounter: Payer: Self-pay | Admitting: Adult Health

## 2021-03-16 ENCOUNTER — Ambulatory Visit: Payer: Federal, State, Local not specified - PPO | Admitting: Adult Health

## 2021-03-16 DIAGNOSIS — G4733 Obstructive sleep apnea (adult) (pediatric): Secondary | ICD-10-CM

## 2021-03-16 DIAGNOSIS — F17210 Nicotine dependence, cigarettes, uncomplicated: Secondary | ICD-10-CM

## 2021-03-16 DIAGNOSIS — Z9989 Dependence on other enabling machines and devices: Secondary | ICD-10-CM

## 2021-03-16 DIAGNOSIS — Z6841 Body Mass Index (BMI) 40.0 and over, adult: Secondary | ICD-10-CM

## 2021-03-16 NOTE — Patient Instructions (Addendum)
Continue on CPAP At bedtime  .  ?Keep up the good work ?Work on healthy weight  ?Do not drive if sleepy  ?Work on quitting smoking  ?Follow up with Dr. Elsworth Soho  or Tacia Hindley NP  In 1 year and As needed   ? ?

## 2021-03-16 NOTE — Assessment & Plan Note (Signed)
Discussed smoking cessation in detail.  Several helpful hints were discussed ?

## 2021-03-16 NOTE — Addendum Note (Signed)
Addended by: Ronney Lion, Earnest Bailey L on: 03/16/2021 10:09 AM ? ? Modules accepted: Orders ? ?

## 2021-03-16 NOTE — Assessment & Plan Note (Signed)
Healthy weight loss discussed in detail.  We discussed referral to healthy weight and wellness unfortunately this program is too expensive for her at this time she is encouraged to continue on her healthy diet and exercise ?

## 2021-03-16 NOTE — Assessment & Plan Note (Signed)
Excellent control compliance on nocturnal CPAP ? ?Plan  ?Patient Instructions  ?Continue on CPAP At bedtime  .  ?Keep up the good work ?Work on healthy weight  ?Do not drive if sleepy  ?Work on quitting smoking  ?Follow up with Dr. Elsworth Soho  or Skylar Priest NP  In 1 year and As needed   ? ?  ? ?

## 2021-03-16 NOTE — Progress Notes (Signed)
? ?'@Patient'$  ID: [name], female    DOB: 1980-09-18, 41 y.o.   MRN: [id number] ? ?Chief Complaint  ?Patient presents with  ? Follow-up  ? ? ?Referring provider: ?Ailene Ards, NP ? ?HPI: ?41 year old female smoker followed for obstructive sleep apnea on nocturnal CPAP ?Started on CPAP in 2018 ? ?TEST/EVENTS :  ?PSG  02/2012 - 263 pounds. Total sleep time was 350 minutes, AHI was 1.2/hour with few RERAs. 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 ?  ?PSG 02/2016 280 lbs ? AHI 12/h ?  ? ?03/16/2021 Follow up : OSA  ?Patient returns for a follow-up visit.  She was last seen via video visit February 2021.  She has underlying mild obstructive sleep apnea on nocturnal CPAP.  Patient says she continues to wear her CPAP every single night.  She cannot sleep without it.  Feels that she benefits from CPAP.  Usually gets in 8 to 9 hours.  CPAP download shows 100% compliance with daily average usage at 9.5 hours.  Patient is on auto CPAP 7 to 12 cm H2O.  AHI is 1.2/hour.  Has minimum leaks. ?Patient continues to smoke.  We discussed smoking cessation. ?Patient has morbid obesity.  Weight has been trending up over the last couple years.  She is at 317 pounds with a BMI of 56.  We discussed healthy weight loss.  We discussed a referral to healthy weight and wellness. Has been to the clinic before but cant afford to go.  ?Starting to work on [employer] and exercise. Working with her sister.  ?Uses [facility] [facility] .  ? ?Allergies  ?Allergen Reactions  ? Codeine   ?  STOMACH CRAMPING  ? Doxycycline Nausea And Vomiting  ? ? ?Immunization History  ?Administered Date(s) Administered  ? Influenza Whole 10/30/2011  ? Influenza, Seasonal, Injecte, Preservative Fre 09/28/2014  ? Influenza,inj,Quad PF,6+ Mos 10/21/2012, 10/03/2017, 09/22/2020  ? Influenza-Unspecified 10/08/2013, 11/02/2015  ? Moderna Sars-Covid-2 Vaccination 05/28/2019, 06/25/2019  ? Td 01/08/2005  ? Tdap 09/28/2014  ? ? ?Past Medical History:  ?Diagnosis  Date  ? Anxiety   ? Back pain   ? Bilateral swelling of feet   ? Chest pain   ? Cluster headaches   ? Depression   ? Eczema   ? GERD (gastroesophageal reflux disease)   ? Gestational diabetes 2012, 2016  ? Hyperlipidemia   ? Hypertension   ? no meds currently  ? Joint pain   ? Osteoarthritis   ? PCOS (polycystic ovarian syndrome)   ? Pre-diabetes   ? Sciatica   ? Shortness of breath   ? Sleep apnea   ? Snoring   ? sleep study 02/2012 with min AHI events  ? Vitamin D deficiency   ? ? ?Tobacco History: ?Social History  ? ?Tobacco Use  ?Smoking Status Every Day  ? Packs/day: 0.50  ? Years: 15.00  ? Pack years: 7.50  ? Types: Cigarettes  ?Smokeless Tobacco Never  ?Tobacco Comments  ? Around 8 cigs a day 03/16/21  ? ?Ready to quit: No ?Counseling given: Yes ?Tobacco comments: Around 8 cigs a day 03/16/21 ? ? ?Outpatient Medications Prior to Visit  ?Medication Sig Dispense Refill  ? Biotin 5000 MCG CAPS Take 1 capsule by mouth.    ? busPIRone (BUSPAR) 10 MG tablet Take 1 tablet (10 mg total) by mouth 3 (three) times daily. For mood control 270 tablet 1  ? celecoxib (CELEBREX) 200 MG capsule Take 1 capsule (200 mg total)  by mouth 2 (two) times daily. 180 capsule 1  ? cetirizine (ZYRTEC) 10 MG tablet Take 1 tablet (10 mg total) by mouth daily. 30 tablet 11  ? Cholecalciferol 50 MCG (2000 UT) TABS 1 tab by mouth once daily 30 tablet 99  ? FLUoxetine (PROZAC) 20 MG capsule TAKE 3 CAPSULES BY MOUTH DAILY FOR MOOD CONTROL 270 capsule 1  ? guaiFENesin (MUCINEX) 600 MG 12 hr tablet Take 2 tablets (1,200 mg total) by mouth 2 (two) times daily as needed. 60 tablet 5  ? meclizine (ANTIVERT) 25 MG tablet Take 1 tablet (25 mg total) by mouth 3 (three) times daily as needed for dizziness. 30 tablet 1  ? OVER THE COUNTER MEDICATION Take 2 capsules by mouth daily. Women's best fat burner    ? terbinafine (LAMISIL) 250 MG tablet Take 1 tablet (250 mg total) by mouth daily. 30 tablet 2  ? ?No facility-administered medications prior to visit.   ? ? ? ?Review of Systems:  ? ?Constitutional:   No  weight loss, night sweats,  Fevers, chills, fatigue, or  lassitude. ? ?HEENT:   No headaches,  Difficulty swallowing,  Tooth/dental problems, or  Sore throat,  ?              No sneezing, itching, ear ache, nasal congestion, post nasal drip,  ? ?CV:  No chest pain,  Orthopnea, PND, swelling in lower extremities, anasarca, dizziness, palpitations, syncope.  ? ?GI  No heartburn, indigestion, abdominal pain, nausea, vomiting, diarrhea, change in bowel habits, loss of appetite, bloody stools.  ? ?Resp: No shortness of breath with exertion or at rest.  No excess mucus, no productive cough,  No non-productive cough,  No coughing up of blood.  No change in color of mucus.  No wheezing.  No chest wall deformity ? ?Skin: no rash or lesions. ? ?GU: no dysuria, change in color of urine, no urgency or frequency.  No flank pain, no hematuria  ? ?MS:  No joint pain or swelling.  No decreased range of motion.  No back pain. ? ? ? ?Physical Exam ? ?BP 122/78 (BP Location: Left Arm, Cuff Size: Large)   Pulse 76   Ht '5\' 3"'$  (1.6 m)   Wt (!) 317 lb (143.8 kg)   SpO2 98%   BMI 56.15 kg/m?  ? ?GEN: A/Ox3; pleasant , NAD, well nourished  ?  ?HEENT:  [place]/AT,   NOSE-clear, THROAT-clear, no lesions, no postnasal drip or exudate noted. Class 3 MP airway  ? ?NECK:  Supple w/ fair ROM; no JVD; normal carotid impulses w/o bruits; no thyromegaly or nodules palpated; no lymphadenopathy.   ? ?RESP  Clear  P & A; w/o, wheezes/ rales/ or rhonchi. no accessory muscle use, no dullness to percussion ? ?CARD:  RRR, no m/r/g, no peripheral edema, pulses intact, no cyanosis or clubbing. ? ?GI:   Soft & nt; nml bowel sounds; no organomegaly or masses detected.  ? ?Musco: Warm bil, no deformities or joint swelling noted.  ? ?Neuro: alert, no focal deficits noted.   ? ?Skin: Warm, no lesions or rashes ? ? ? ? ? ?BNP ?No results found for: BNP ? ?ProBNP ?No results found for: PROBNP ? ?Imaging: ?No  results found. ? ? ? ?No flowsheet data found. ? ?No results found for: NITRICOXIDE ? ? ? ? ? ?Assessment & Plan:  ? ?OSA on CPAP ?Excellent control compliance on nocturnal CPAP ? ?Plan  ?Patient Instructions  ?Continue on CPAP At bedtime  .  ?  Keep up the good work ?Work on healthy weight  ?Do not drive if sleepy  ?Work on quitting smoking  ?Follow up with Dr. Elsworth Soho  or Glennon Kopko NP  In 1 year and As needed   ? ?  ? ? ?Class 3 severe obesity with serious comorbidity and body mass index (BMI) of 50.0 to 59.9 in adult [facility]) ?Healthy weight loss discussed in detail.  We discussed referral to healthy weight and wellness unfortunately this program is too expensive for her at this time she is encouraged to continue on her healthy diet and exercise ? ?Nicotine dependence, cigarettes, uncomplicated ?Discussed smoking cessation in detail.  Several helpful hints were discussed ? ? ? ? ?Rexene Edison, NP ?03/16/2021 ? ?

## 2021-03-23 ENCOUNTER — Ambulatory Visit: Payer: Federal, State, Local not specified - PPO | Admitting: Nurse Practitioner

## 2021-03-29 ENCOUNTER — Ambulatory Visit: Payer: Federal, State, Local not specified - PPO | Admitting: Physician Assistant

## 2021-03-29 ENCOUNTER — Other Ambulatory Visit: Payer: Self-pay | Admitting: Physician Assistant

## 2021-03-29 ENCOUNTER — Other Ambulatory Visit: Payer: Self-pay

## 2021-03-29 DIAGNOSIS — D485 Neoplasm of uncertain behavior of skin: Secondary | ICD-10-CM

## 2021-03-29 DIAGNOSIS — D2239 Melanocytic nevi of other parts of face: Secondary | ICD-10-CM | POA: Diagnosis not present

## 2021-03-29 DIAGNOSIS — D2222 Melanocytic nevi of left ear and external auricular canal: Secondary | ICD-10-CM

## 2021-03-29 DIAGNOSIS — Z1283 Encounter for screening for malignant neoplasm of skin: Secondary | ICD-10-CM

## 2021-03-29 DIAGNOSIS — Z808 Family history of malignant neoplasm of other organs or systems: Secondary | ICD-10-CM

## 2021-03-29 NOTE — Patient Instructions (Signed)

## 2021-03-30 ENCOUNTER — Ambulatory Visit: Payer: Federal, State, Local not specified - PPO | Admitting: Nurse Practitioner

## 2021-04-04 ENCOUNTER — Ambulatory Visit: Payer: Federal, State, Local not specified - PPO | Admitting: Podiatry

## 2021-04-12 ENCOUNTER — Encounter: Payer: Self-pay | Admitting: Podiatry

## 2021-04-12 ENCOUNTER — Ambulatory Visit: Payer: Federal, State, Local not specified - PPO | Admitting: Podiatry

## 2021-04-12 DIAGNOSIS — B351 Tinea unguium: Secondary | ICD-10-CM

## 2021-04-12 NOTE — Progress Notes (Signed)
?  Subjective:  ?Patient ID: [name], female    DOB: Jun 21, 1980,   MRN: [id number] ? ?Chief Complaint  ?Patient presents with  ? Nail Problem  ?   ?Fungal nail follow up  ? ? ?41 y.o. female presents for bilatearl hallux nails follow-up. Has been taking lamisil and doing well. No side effects. Relates nails are improving.  . Denies any other pedal complaints. Denies n/v/f/c.  ? ?Past Medical History:  ?Diagnosis Date  ? Anxiety   ? Back pain   ? Bilateral swelling of feet   ? Chest pain   ? Cluster headaches   ? Depression   ? Eczema   ? GERD (gastroesophageal reflux disease)   ? Gestational diabetes 2012, 2016  ? Hyperlipidemia   ? Hypertension   ? no meds currently  ? Joint pain   ? Osteoarthritis   ? PCOS (polycystic ovarian syndrome)   ? Pre-diabetes   ? Sciatica   ? Shortness of breath   ? Sleep apnea   ? Snoring   ? sleep study 02/2012 with min AHI events  ? Vitamin D deficiency   ? ? ?Objective:  ?Physical Exam: ?Vascular: DP/PT pulses 2/4 bilateral. CFT <3 seconds. Normal hair growth on digits. No edema.  ?Skin. No lacerations or abrasions bilateral feet. Hallux nails thickened covered with nail polish today so unable to evaluate but patient relates improvement.  ?Musculoskeletal: MMT 5/5 bilateral lower extremities in DF, PF, Inversion and Eversion. Deceased ROM in DF of ankle joint.  ?Neurological: Sensation intact to light touch.  ? ?Assessment:  ? ?1. Onychomycosis   ? ? ? ?Plan:  ?Patient was evaluated and treated and all questions answered. ?-Examined patient ?-Discussed treatment options for painful dystrophic nails  ?-Nails have been improving.  ?-Finish out lamisil ?-Should continue to improve.  ?Patient to return as needed.  ? ? ?Lorenda Peck, DPM  ? ? ?

## 2021-04-19 ENCOUNTER — Encounter: Payer: Self-pay | Admitting: Physician Assistant

## 2021-04-19 NOTE — Progress Notes (Signed)
? ?New Patient ?  ?Subjective  ?[name] is a 41 y.o. female who presents for the following: Annual Exam (Patient wants to discuss her moles. She says they start out as moles and then grow. She has one on her left ear that is irritated. /No history of skin cancer. Family history of non mole skin cancer. Mother has SCC. ). ? ? ?The following portions of the chart were reviewed this encounter and updated as appropriate:  Tobacco  Allergies  Meds  Problems  Med Hx  Surg Hx  Fam Hx   ?  ? ?Objective  ?Well appearing patient in no apparent distress; mood and affect are within normal limits. ? ?All skin waist up examined. ? ?Left Anterior Lobule ?Black, crusted papule ? ? ? ? ?Left Alar Crease ?Pink papule ? ? ? ? ?Left Ala Nasi ?Dark papule ? ? ? ? ?Mid Tip of Nose ?Skin toned papule ? ? ? ? ?Right Nasal Sidewall ?Skin toned papule ? ? ? ? ? ?Assessment & Plan  ?Neoplasm of uncertain behavior of skin (5) ?Left Anterior Lobule ? ?Skin / nail biopsy ?Type of biopsy: tangential   ?Informed consent: discussed and consent obtained   ?Timeout: patient name, date of birth, surgical site, and procedure verified   ?Anesthesia: the lesion was anesthetized in a standard fashion   ?Anesthetic:  1% lidocaine w/ epinephrine 1-100,000 local infiltration ?Instrument used: flexible razor blade   ?Hemostasis achieved with: aluminum chloride and electrodesiccation   ?Outcome: patient tolerated procedure well   ?Post-procedure details: wound care instructions given   ? ?Specimen 1 - Surgical pathology ?Differential Diagnosis: nevus ? ?Check Margins: No ? ?Left Alar Crease ? ?Skin / nail biopsy ?Type of biopsy: tangential   ?Informed consent: discussed and consent obtained   ?Timeout: patient name, date of birth, surgical site, and procedure verified   ?Procedure prep:  Patient was prepped and draped in usual sterile fashion (Non sterile) ?Prep type:  Chlorhexidine ?Anesthesia: the lesion was anesthetized in a standard  fashion   ?Anesthetic:  1% lidocaine w/ epinephrine 1-100,000 local infiltration ?Instrument used: flexible razor blade   ?Outcome: patient tolerated procedure well   ?Post-procedure details: wound care instructions given   ? ?Specimen 2 - Surgical pathology ?Differential Diagnosis: nevus ? ?Check Margins: No ? ?Left Ala Nasi ? ?Skin / nail biopsy ?Type of biopsy: tangential   ?Informed consent: discussed and consent obtained   ?Timeout: patient name, date of birth, surgical site, and procedure verified   ?Anesthesia: the lesion was anesthetized in a standard fashion   ?Anesthetic:  1% lidocaine w/ epinephrine 1-100,000 local infiltration ?Instrument used: flexible razor blade   ?Hemostasis achieved with: aluminum chloride and electrodesiccation   ?Outcome: patient tolerated procedure well   ?Post-procedure details: wound care instructions given   ? ?Specimen 3 - Surgical pathology ?Differential Diagnosis: nevus ? ?Check Margins: No ? ?Mid Tip of Nose ? ?Skin / nail biopsy ?Type of biopsy: tangential   ?Informed consent: discussed and consent obtained   ?Timeout: patient name, date of birth, surgical site, and procedure verified   ?Anesthesia: the lesion was anesthetized in a standard fashion   ?Anesthetic:  1% lidocaine w/ epinephrine 1-100,000 local infiltration ?Instrument used: flexible razor blade   ?Hemostasis achieved with: aluminum chloride and electrodesiccation   ?Outcome: patient tolerated procedure well   ?Post-procedure details: wound care instructions given   ? ?Specimen 4 - Surgical pathology ?Differential Diagnosis: nevus ? ?Check Margins: No ? ?Right Nasal Sidewall ? ?  Skin / nail biopsy ?Type of biopsy: tangential   ?Informed consent: discussed and consent obtained   ?Timeout: patient name, date of birth, surgical site, and procedure verified   ?Anesthesia: the lesion was anesthetized in a standard fashion   ?Anesthetic:  1% lidocaine w/ epinephrine 1-100,000 local infiltration ?Instrument used:  flexible razor blade   ?Hemostasis achieved with: aluminum chloride and electrodesiccation   ?Outcome: patient tolerated procedure well   ?Post-procedure details: wound care instructions given   ? ?Specimen 5 - Surgical pathology ?Differential Diagnosis: nevus ? ?Check Margins: No ? ? ?No atypical nevi noted at the time of the visit. ? ?I, Decarlo Rivet, PA-C, have reviewed all documentation's for this visit.  The documentation on 04/19/21 for the exam, diagnosis, procedures and orders are all accurate and complete. ?

## 2021-04-20 ENCOUNTER — Ambulatory Visit: Payer: Federal, State, Local not specified - PPO | Admitting: Nurse Practitioner

## 2021-04-20 ENCOUNTER — Other Ambulatory Visit: Payer: Self-pay | Admitting: Nurse Practitioner

## 2021-04-20 ENCOUNTER — Encounter: Payer: Self-pay | Admitting: Nurse Practitioner

## 2021-04-20 VITALS — BP 120/80 | HR 80 | Temp 98.4°F | Ht 63.0 in | Wt 312.0 lb

## 2021-04-20 DIAGNOSIS — R7303 Prediabetes: Secondary | ICD-10-CM

## 2021-04-20 DIAGNOSIS — E559 Vitamin D deficiency, unspecified: Secondary | ICD-10-CM

## 2021-04-20 DIAGNOSIS — E785 Hyperlipidemia, unspecified: Secondary | ICD-10-CM | POA: Diagnosis not present

## 2021-04-20 DIAGNOSIS — R079 Chest pain, unspecified: Secondary | ICD-10-CM

## 2021-04-20 LAB — BASIC METABOLIC PANEL
BUN: 12 mg/dL (ref 6–23)
CO2: 30 mEq/L (ref 19–32)
Calcium: 9.4 mg/dL (ref 8.4–10.5)
Chloride: 101 mEq/L (ref 96–112)
Creatinine, Ser: 0.82 mg/dL (ref 0.40–1.20)
GFR: 88.74 mL/min (ref >60.00)
Glucose, Bld: 86 mg/dL (ref 70–99)
Potassium: 4.1 mEq/L (ref 3.5–5.1)
Sodium: 136 mEq/L (ref 135–145)

## 2021-04-20 LAB — LIPID PANEL
Cholesterol: 215 mg/dL — ABNORMAL HIGH (ref 0–200)
HDL: 34.2 mg/dL — ABNORMAL LOW (ref >39.00)
LDL Cholesterol: 144 mg/dL — ABNORMAL HIGH (ref 0–99)
NonHDL: 180.7
Total CHOL/HDL Ratio: 6
Triglycerides: 184 mg/dL — ABNORMAL HIGH (ref 0.0–149.0)
VLDL: 36.8 mg/dL (ref 0.0–40.0)

## 2021-04-20 LAB — VITAMIN D 25 HYDROXY (VIT D DEFICIENCY, FRACTURES): VITD: 34.08 ng/mL (ref 30.00–100.00)

## 2021-04-20 LAB — HEMOGLOBIN A1C: Hgb A1c MFr Bld: 6.3 % (ref 4.6–6.5)

## 2021-04-20 MED ORDER — METFORMIN HCL 500 MG PO TABS: ORAL_TABLET | ORAL | 1 refills | Status: DC

## 2021-04-20 NOTE — Patient Instructions (Signed)
Health Maintenance Due  ?Topic Date Due  ? COVID-19 Vaccine (3 - Moderna risk series) 07/23/2019  ? PAP SMEAR-Modifier  03/08/2021  ? ? ? ? Row Labels 03/09/2021  ?  8:39 AM 10/06/2020  ?  8:43 AM 09/29/2020  ?  8:42 AM  ?Depression screen PHQ 2/9   Section Header. No data exists in this row.     ?Decreased Interest    0   ?Down, Depressed, Hopeless    0   ?PHQ - 2 Score    0   ?Altered sleeping    0   ?Tired, decreased energy    0   ?Change in appetite    0   ?Feeling bad or failure about yourself     0   ?Trouble concentrating    0   ?Moving slowly or fidgety/restless    0   ?Suicidal thoughts    0   ?PHQ-9 Score    0   ?  ? Information is confidential and restricted. Go to Review Flowsheets to unlock data.  ? ? ?

## 2021-04-20 NOTE — Progress Notes (Signed)
? ? ? ?Subjective:  ?Patient ID: [name], female    DOB: Jul 15, 1980  Age: 41 y.o. MRN: [id number] ? ?CC:  ?Chief Complaint  ?Patient presents with  ? Follow-up  ? Chest Pain  ?  ? ? ?HPI  ?This patient arrives today for the above. ? ?Chest Pain/HLD/prediabetes: ASCVD risk were approximate [place]an percent.  Patient is a smoker.  She has a history of cardiovascular disease in her sister (diagnosed at age 33), father (history of 2 MIs, first being in 43s or 51s), mother (MI age 5), and grandmother with heart disease.  Patient reports intermittent chest pain that will occur with or without exercise.  It will last for a few minutes and spontaneously resolved.  She thinks it may be related to reflux but does not note any specific foods that trigger it.  She denies any burping.  Since last office visit she has been making lifestyle changes and that her diet.  She is been eating less carbohydrates, she also bought an air [place].  She has not been using the air [place] but plans on doing this soon to reduce her intake of oils.  Last A1c was collected about 6 months ago and was 6.1.  She is not currently on any medication for her blood sugar. ? ?Vitamin D deficiency: Last time serum level was checked it was 21.9.  She was not on any vitamin D supplement at that time.  She is now on 2000 IUs of vitamin D3 daily.  She is tolerating this well. ? ?Dizziness: She continues to have intermittent episodes of dizziness.  They last for a few moments and then stop spontaneously. ? ? ? ?Past Medical History:  ?Diagnosis Date  ? Anxiety   ? Back pain   ? Bilateral swelling of feet   ? Chest pain   ? Cluster headaches   ? Depression   ? Eczema   ? GERD (gastroesophageal reflux disease)   ? Gestational diabetes 2012, 2016  ? Hyperlipidemia   ? Hypertension   ? no meds currently  ? Joint pain   ? Osteoarthritis   ? PCOS (polycystic ovarian syndrome)   ? Pre-diabetes   ? Sciatica   ? Shortness of breath   ? Sleep apnea   ? Snoring   ?  sleep study 02/2012 with min AHI events  ? Vitamin D deficiency   ? ? ? ? ?Family History  ?Problem Relation Age of Onset  ? Arthritis Mother   ? Alcohol abuse Mother   ? Mental illness Mother   ? Cancer Mother 65  ?     squamous - unknown primary  ? Coronary artery disease Mother 25  ?     MI with stent  ? Anxiety disorder Mother   ? Depression Mother   ? Diabetes Mother   ? Obesity Mother   ? Arthritis Father   ? Alcohol abuse Father   ? Hyperlipidemia Father   ? Heart disease Father   ? Kidney disease Father   ? Stroke Father   ? Mental illness Father   ? Drug abuse Father   ? Hypertension Father   ? Depression Father   ? Alcoholism Father   ? Diabetes Maternal Grandmother   ? Heart disease Maternal Grandmother   ? Stroke Maternal Grandfather   ? Hypertension Paternal Grandfather   ? Hyperlipidemia Paternal Grandfather   ? Arthritis Other   ? Alcohol abuse Other   ? Breast cancer Paternal Aunt   ?  Anxiety disorder Sister   ? ? ?Social History  ? 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 Married, lives with spouse and 2 kids  ? Moved to [place] summer 2011 to be near mom - originally from [place]  ? Denies abuse and feels safe at home.  ? ?Social History  ? ?Tobacco Use  ? Smoking status: Every Day  ?  Packs/day: 0.50  ?  Years: 15.00  ?  Pack years: 7.50  ?  Types: Cigarettes  ? Smokeless tobacco: Never  ? Tobacco comments:  ?  Around 8 cigs a day 03/16/21  ?Substance Use Topics  ? Alcohol use: Yes  ?  Comment: once a month or less  ? ? ? ?No outpatient medications have been marked as taking for the 04/20/21 encounter (Office Visit) with Ailene Ards, NP.  ? ? ?ROS:  ?Review of Systems  ?Respiratory:  Negative for shortness of breath.   ?Cardiovascular:  Positive for chest pain. Negative for palpitations.  ?Neurological:  Positive for dizziness. Negative for seizures.  ? ? ?Objective:  ? ?Today's Vitals: BP 120/80   Pulse 80   Temp 98.4 ?F (36.9 ?C) (Oral)   Ht '5\' 3"'$  (1.6 m)   Wt (!) 312 lb (141.5 kg)   SpO2 98%   BMI  55.27 kg/m?  ? ?  04/20/2021  ?  9:48 AM 03/16/2021  ?  9:41 AM 03/16/2021  ?  9:38 AM  ?Vitals with BMI  ?Height '5\' 3"'$   '5\' 3"'$   ?Weight 312 lbs  317 lbs  ?BMI 55.28  56.17  ?Systolic 546 503 546  ?Diastolic 80 78 78  ?Pulse 80  76  ?  ? ?Physical Exam ?Vitals reviewed.  ?Constitutional:   ?   General: She is not in acute distress. ?   Appearance: Normal appearance.  ?HENT:  ?   Head: Normocephalic and atraumatic.  ?Neck:  ?   Vascular: No carotid bruit.  ?Cardiovascular:  ?   Rate and Rhythm: Normal rate and regular rhythm.  ?   Pulses: Normal pulses.  ?   Heart sounds: Normal heart sounds.  ?Pulmonary:  ?   Effort: Pulmonary effort is normal.  ?   Breath sounds: Normal breath sounds.  ?Skin: ?   General: Skin is warm and dry.  ?Neurological:  ?   General: No focal deficit present.  ?   Mental Status: She is alert and oriented to person, place, and time.  ?Psychiatric:     ?   Mood and Affect: Mood normal.     ?   Behavior: Behavior normal.     ?   Judgment: Judgment normal.  ? ? ? ? ? ? ? ?Assessment and Plan  ? ?1. Chest pain, unspecified type   ?2. Hyperlipidemia, unspecified hyperlipidemia type   ?3. Vitamin D deficiency   ?4. Prediabetes   ? ? ? ?Plan: ?See plan via problem list below. ? ? ?Tests ordered ?Orders Placed This Encounter  ?Procedures  ? Vitamin D (25 hydroxy)  ? Basic Metabolic Panel (BMET)  ? HgB A1c  ? Lipid Profile  ? Ambulatory referral to Cardiology  ? ? ? ? ?No orders of the defined types were placed in this encounter. ? ? ?Patient to follow-up in 6 weeks or sooner as needed. ? ?Ailene Ards, NP ? ?

## 2021-04-20 NOTE — Assessment & Plan Note (Signed)
We will recheck A1c today.  We did discuss possibly starting metformin if A1c remains in prediabetic range.  She is open to this option, further recommendations be made based upon lab results.  Patient to continue focusing on diet lifestyle changes, she was encouraged with changes she is already made today. ?

## 2021-04-20 NOTE — Assessment & Plan Note (Signed)
Chronic, stable.  She has a strong family history for cardiovascular disease, in addition she has multiple personal risk factors including smoking, hyperlipidemia, and obesity.  Her chest pain does appear to be atypical but I did recommend referral to cardiology for work-up.  Referral made today.  We also discussed that her ASCVD risk score is around 11%, and she may be a candidate for statin therapy.  We will repeat lipid panel as she has made some dietary lifestyle changes since last office visit.  She has drank coffee with creamer, otherwise she has not eaten anything yet today.  Further recommendations may be made based upon these results. ?

## 2021-04-20 NOTE — Assessment & Plan Note (Signed)
Chronic, stable continue on 2000 IUs of vitamin D3 daily.  We will recheck serum level today as well as calcium level.  Further recommendations be made based upon these results. ?

## 2021-04-20 NOTE — Assessment & Plan Note (Signed)
Chronic, stable.  ASCVD risk score approximate 11%.  She is not on statin therapy currently.  We did discuss risks and benefits of statins.  We will check lipid panel today for further evaluation, further recommendations may be made based upon these results. ?

## 2021-05-01 ENCOUNTER — Ambulatory Visit: Payer: Federal, State, Local not specified - PPO | Admitting: Internal Medicine

## 2021-05-01 NOTE — Progress Notes (Deleted)
Cardiology Office Note:    Date:  05/01/2021   ID:  [name], DOB 11-Oct-1980, MRN [id number]  PCP:  Ailene Ards, NP   [facility] Providers Cardiologist:  None { Click to update primary MD,subspecialty MD or APP then REFRESH:1}    Referring MD: Ailene Ards, NP   No chief complaint on file. ***  History of Present Illness:    [name] is a 41 y.o. female with a hx of GERD, anxiety, PCOS, OSA, referral for chest pain    Past Medical History:  Diagnosis Date   Anxiety    Back pain    Bilateral swelling of feet    Chest pain    Cluster headaches    Depression    Eczema    GERD (gastroesophageal reflux disease)    Gestational diabetes 2012, 2016   Hyperlipidemia    Hypertension    no meds currently   Joint pain    Osteoarthritis    PCOS (polycystic ovarian syndrome)    Pre-diabetes    Sciatica    Shortness of breath    Sleep apnea    Snoring    sleep study 02/2012 with min AHI events   Vitamin D deficiency     Past Surgical History:  Procedure Laterality Date   CESAREAN SECTION  09 & 12   x's 2   CESAREAN SECTION WITH BILATERAL TUBAL LIGATION Bilateral 12/18/2014   Procedure: REPEAT CESAREAN SECTION WITH BILATERAL TUBAL LIGATION;  Surgeon: Crawford Givens, MD;  Location: [facility];  Service: Obstetrics;  Laterality: Bilateral;   COLPOSCOPY     DENTAL SURGERY     Ganglion removal from (L) wrist  1998   LAPAROSCOPIC UNILATERAL SALPINGECTOMY Left 01/26/2015   Procedure: LAPAROSCOPIC UNILATERAL SALPINGECTOMY;  Surgeon: Crawford Givens, MD;  Location: [facility];  Service: Gynecology;  Laterality: Left;   WISDOM TOOTH EXTRACTION      Current Medications: No outpatient medications have been marked as taking for the 05/01/21 encounter (Appointment) with Janina Mayo, MD.     Allergies:   Codeine and Doxycycline   Social History   Socioeconomic History   Marital status: Married    Spouse name: Not on file   Number of children: 3   Years of  education: 16   Highest education level: Not on file  Occupational History   Occupation: Group Network engineer  Tobacco Use   Smoking status: Every Day    Packs/day: 0.50    Years: 15.00    Pack years: 7.50    Types: Cigarettes   Smokeless tobacco: Never   Tobacco comments:    Around 8 cigs a day 03/16/21  Vaping Use   Vaping Use: Never used  Substance and Sexual Activity   Alcohol use: Yes    Comment: once a month or less   Drug use: No   Sexual activity: Yes    Partners: Male    Birth control/protection: None  Other Topics Concern   Not on file  Social History Narrative   Secretary   Married, lives with spouse and 2 kids   Moved to [facility] summer [place] to be near mom - originally from [place]   Denies abuse and feels safe at home.   Social Determinants of Health   Financial Resource Strain: Not on file  Food Insecurity: Not on file  Transportation Needs: Not on file  Physical Activity: Not on file  Stress: Not on file  Social Connections: Not on file  Family History: The patient's ***family history includes Alcohol abuse in her father, mother, and another family member; Alcoholism in her father; Anxiety disorder in her mother and sister; Arthritis in her father, mother, and another family member; Breast cancer in her paternal aunt; Cancer (age of onset: 36) in her mother; Coronary artery disease (age of onset: 34) in her mother; Depression in her father and mother; Diabetes in her maternal grandmother and mother; Drug abuse in her father; Heart disease in her father and maternal grandmother; Hyperlipidemia in her father and paternal grandfather; Hypertension in her father and paternal grandfather; Kidney disease in her father; Mental illness in her father and mother; Obesity in her mother; Stroke in her father and maternal grandfather.  ROS:   Please see the history of present illness.    *** All other systems reviewed and are negative.  EKGs/Labs/Other Studies Reviewed:    The  following studies were reviewed today: ***  EKG:  EKG is *** ordered today.  The ekg ordered today demonstrates ***  Recent Labs: 09/22/2020: ALT 21; Hemoglobin 12.9; Platelets 276.0; TSH 3.38 04/20/2021: BUN 12; Creatinine, Ser 0.82; Potassium 4.1; Sodium 136  Recent Lipid Panel    Component Value Date/Time   CHOL 215 (H) 04/20/2021 1122   TRIG 184.0 (H) 04/20/2021 1122   TRIG 104 03/13/2016 0958   HDL 34.20 (L) 04/20/2021 1122   HDL 52 03/13/2016 0958   CHOLHDL 6 04/20/2021 1122   VLDL 36.8 04/20/2021 1122   LDLCALC 144 (H) 04/20/2021 1122   LDLDIRECT 111.5 09/09/2012 0739     Risk Assessment/Calculations:   {Does this patient have ATRIAL FIBRILLATION?:[id number]}       Physical Exam:    VS:  There were no vitals taken for this visit.    Wt Readings from Last 3 Encounters:  04/20/21 (!) 312 lb (141.5 kg)  03/16/21 (!) 317 lb (143.8 kg)  11/01/20 (!) 318 lb (144.2 kg)     GEN: *** Well nourished, well developed in no acute distress HEENT: Normal NECK: No JVD; No carotid bruits LYMPHATICS: No lymphadenopathy CARDIAC: ***RRR, no murmurs, rubs, gallops RESPIRATORY:  Clear to auscultation without rales, wheezing or rhonchi  ABDOMEN: Soft, non-tender, non-distended MUSCULOSKELETAL:  No edema; No deformity  SKIN: Warm and dry NEUROLOGIC:  Alert and oriented x 3 PSYCHIATRIC:  Normal affect   ASSESSMENT:    No diagnosis found. PLAN:    In order of problems listed above:  ***      {Are you ordering a CV Procedure (e.g. stress test, cath, DCCV, TEE, etc)?   Press F2        :[id number]}    Medication Adjustments/Labs and Tests Ordered: Current medicines are reviewed at length with the patient today.  Concerns regarding medicines are outlined above.  No orders of the defined types were placed in this encounter.  No orders of the defined types were placed in this encounter.   There are no Patient Instructions on file for this visit.   Signed, Janina Mayo,  MD  05/01/2021 7:56 AM    [facility]

## 2021-05-08 ENCOUNTER — Ambulatory Visit: Payer: Federal, State, Local not specified - PPO | Admitting: Internal Medicine

## 2021-05-08 ENCOUNTER — Encounter: Payer: Self-pay | Admitting: Internal Medicine

## 2021-05-08 VITALS — BP 118/70 | HR 72 | Ht 63.0 in | Wt 316.2 lb

## 2021-05-08 DIAGNOSIS — R072 Precordial pain: Secondary | ICD-10-CM

## 2021-05-08 NOTE — Progress Notes (Signed)
?Cardiology Office Note:   ? ?Date:  05/08/2021  ? ?ID:  [name], DOB 09/05/1980, MRN [id number] ? ?PCP:  Ailene Ards, NP ?  ?[facility] Providers ?Cardiologist:  Janina Mayo, MD    ? ?Referring MD: Ailene Ards, NP  ? ?No chief complaint on file. ?Chronic atypical CP ? ?History of Present Illness:   ? ?[name] is a 41 y.o. female with a hx of HTN, depression, anxiety, GERD, OSA on CPAP, pre-DM2, PCOS referral for CP ? ?She reported chest pain with or without exercise; lasting a few minutes and spontaneously resolve. She is an active smoker. She notes once a week she has CP. She can be sitting. Its left sided.  ? ?Sister sees a cardiologist, unknown reason. She's 44. and father Mis 50s/60s. ? ?Body mass index is 56.01 kg/m?Marland Kitchen She saw a nutritionist.  ? ? ?Blood pressures are normal ? ?She is a smoker ? ?Noted to have atypical CP since 2017 ? ? ?Past Medical History:  ?Diagnosis Date  ? Anxiety   ? Back pain   ? Bilateral swelling of feet   ? Chest pain   ? Cluster headaches   ? Depression   ? Eczema   ? GERD (gastroesophageal reflux disease)   ? Gestational diabetes 2012, 2016  ? Hyperlipidemia   ? Hypertension   ? no meds currently  ? Joint pain   ? Osteoarthritis   ? PCOS (polycystic ovarian syndrome)   ? Pre-diabetes   ? Sciatica   ? Shortness of breath   ? Sleep apnea   ? Snoring   ? sleep study 02/2012 with min AHI events  ? Vitamin D deficiency   ? ? ?Past Surgical History:  ?Procedure Laterality Date  ? CESAREAN SECTION  09 & 12  ? x's 2  ? CESAREAN SECTION WITH BILATERAL TUBAL LIGATION Bilateral 12/18/2014  ? Procedure: REPEAT CESAREAN SECTION WITH BILATERAL TUBAL LIGATION;  Surgeon: Crawford Givens, MD;  Location: [facility];  Service: Obstetrics;  Laterality: Bilateral;  ? COLPOSCOPY    ? DENTAL SURGERY    ? Ganglion removal from (L) wrist  1998  ? LAPAROSCOPIC UNILATERAL SALPINGECTOMY Left 01/26/2015  ? Procedure: LAPAROSCOPIC UNILATERAL SALPINGECTOMY;  Surgeon: Crawford Givens, MD;   Location: [facility];  Service: Gynecology;  Laterality: Left;  ? WISDOM TOOTH EXTRACTION    ? ? ?Current Medications: ?Current Meds  ?Medication Sig  ? Biotin 5000 MCG CAPS Take 1 capsule by mouth.  ? busPIRone (BUSPAR) 10 MG tablet Take 1 tablet (10 mg total) by mouth 3 (three) times daily. For mood control  ? celecoxib (CELEBREX) 200 MG capsule Take 1 capsule (200 mg total) by mouth 2 (two) times daily.  ? cetirizine (ZYRTEC) 10 MG tablet Take 1 tablet (10 mg total) by mouth daily.  ? Cholecalciferol 50 MCG (2000 UT) TABS 1 tab by mouth once daily  ? FLUoxetine (PROZAC) 20 MG capsule TAKE 3 CAPSULES BY MOUTH DAILY FOR MOOD CONTROL  ? guaiFENesin (MUCINEX) 600 MG 12 hr tablet Take 2 tablets (1,200 mg total) by mouth 2 (two) times daily as needed.  ? meclizine (ANTIVERT) 25 MG tablet Take 1 tablet (25 mg total) by mouth 3 (three) times daily as needed for dizziness.  ? metFORMIN (GLUCOPHAGE) 500 MG tablet Take 1/2 tablet by mouth daily with your first meal for 2 weeks. Then take 1 tablet by mouth daily with your first meal.  ? OVER THE COUNTER MEDICATION Take 2 capsules  by mouth daily. Women's best fat burner  ?  ? ?Allergies:   Codeine and Doxycycline  ? ?Social History  ? ?Socioeconomic History  ? Marital status: Married  ?  Spouse name: Not on file  ? Number of children: 3  ? Years of education: 65  ? Highest education level: Not on file  ?Occupational History  ? Occupation: Group Network engineer  ?Tobacco Use  ? Smoking status: Every Day  ?  Packs/day: 0.50  ?  Years: 15.00  ?  Pack years: 7.50  ?  Types: Cigarettes  ? Smokeless tobacco: Never  ? Tobacco comments:  ?  Around 8 cigs a day 03/16/21  ?Vaping Use  ? Vaping Use: Never used  ?Substance and Sexual Activity  ? Alcohol use: Yes  ?  Comment: once a month or less  ? Drug use: No  ? Sexual activity: Yes  ?  Partners: Male  ?  Birth control/protection: None  ?Other Topics Concern  ? 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 Moved to [place] summer 2011 to be near mom - originally from [place]  ? Denies abuse and feels safe at home.  ? ?Social Determinants of Health  ? ?Financial Resource Strain: Not on file  ?Food Insecurity: Not on file  ?Transportation Needs: Not on file  ?Physical Activity: Not on file  ?Stress: Not on file  ?Social Connections: Not on file  ?  ? ?Family History: ?The patient's family history includes Alcohol abuse in her father, mother, and another family member; Alcoholism in her father; Anxiety disorder in her mother and sister; Arthritis in her father, mother, and another family member; Breast cancer in her paternal aunt; Cancer (age of onset: 1) in her mother; Coronary artery disease (age of onset: 49) in her mother; Depression in her father and mother; Diabetes in her maternal grandmother and mother; Drug abuse in her father; Heart disease in her father and maternal grandmother; Hyperlipidemia in her father and paternal grandfather; Hypertension in her father and paternal grandfather; Kidney disease in her father; Mental illness in her father and mother; Obesity in her mother; Stroke in her father and maternal grandfather. ? ?ROS:   ?Please see the history of present illness.    ? All other systems reviewed and are negative. ? ?EKGs/Labs/Other Studies Reviewed:   ? ?The following studies were reviewed today: ? ? ?EKG:  EKG is  ordered today.  The ekg ordered today demonstrates  ? ?EKG- 5/1 NSR, poor r wave progression ( likely in the setting of habitus) ? ?Recent Labs: ?09/22/2020: ALT 21; Hemoglobin 12.9; Platelets 276.0; TSH 3.38 ?04/20/2021: BUN 12; Creatinine, Ser 0.82; Potassium 4.1; Sodium 136  ?Recent Lipid Panel ?   ?Component Value Date/Time  ? CHOL 215 (H) 04/20/2021 1122  ? TRIG 184.0 (H) 04/20/2021 1122  ? TRIG 104 03/13/2016 0958  ? HDL 34.20 (L) 04/20/2021 1122  ? HDL 52 03/13/2016 0958  ? CHOLHDL 6 04/20/2021 1122  ? VLDL 36.8 04/20/2021 1122  ? LDLCALC 144 (H) 04/20/2021 1122  ? LDLDIRECT 111.5  09/09/2012 0739  ? ? ? ?Risk Assessment/Calculations:   ?  ? ?    ? ?Physical Exam:   ? ?VS:  ? ?Vitals:  ? 05/08/21 1033  ?BP: 118/70  ?Pulse: 72  ?SpO2: 97%  ? ? ? ?Wt Readings from Last 3 Encounters:  ?05/08/21 (!) 316 lb 3.2 oz (143.4 kg)  ?04/20/21 Marland Kitchen)  312 lb (141.5 kg)  ?03/16/21 (!) 317 lb (143.8 kg)  ?  ? ?GEN: Obese,  Well nourished, well developed in no acute distress ?HEENT: Normal ?NECK: No JVD; No carotid bruits ?LYMPHATICS: No lymphadenopathy ?CARDIAC: RRR, no murmurs, rubs, gallops ?RESPIRATORY:  Clear to auscultation without rales, wheezing or rhonchi  ?ABDOMEN: Soft, non-tender, non-distended ?MUSCULOSKELETAL:  No edema; No deformity  ?SKIN: Warm and dry ?NEUROLOGIC:  Alert and oriented x 3 ?PSYCHIATRIC:  Normal affect  ? ?ASSESSMENT:   ? ?Atypical Chest pain: The nature of the pain is very atypical. Her EKG today was normal. Consider the low pretest probability and atypical pain will not recommend further testing. Recommend to continue with lifestyle modification and CVD risk mitigation (yearly A1c, lipid monitoring); otherwise he does not have an indication to continue to follow with cardiology at this time. Consider re-engagement with nutritionist and aim for  bariatric surgery  ? ?PLAN:   ? ?In order of problems listed above: ? ?Follow up as needed ? ?   ? ?  ?Medication Adjustments/Labs and Tests Ordered: ?Current medicines are reviewed at length with the patient today.  Concerns regarding medicines are outlined above.  ?No orders of the defined types were placed in this encounter. ? ?No orders of the defined types were placed in this encounter. ? ? ?Patient Instructions  ?Medication Instructions:  ?No Changes In Medications at this time.  ?*If you need a refill on your cardiac medications before your next appointment, please call your pharmacy* ? ?Lab Work: ?None Ordered At This Time.  ?If you have labs (blood work) drawn today and your tests are completely normal, you will receive your  results only by: ?MyChart Message (if you have MyChart) OR ?A paper copy in the mail ?If you have any lab test that is abnormal or we need to change your treatment, we will call you to review the results. ? ?Test

## 2021-05-08 NOTE — Patient Instructions (Signed)

## 2021-05-16 DIAGNOSIS — G4733 Obstructive sleep apnea (adult) (pediatric): Secondary | ICD-10-CM | POA: Diagnosis not present

## 2021-06-09 ENCOUNTER — Ambulatory Visit: Payer: Federal, State, Local not specified - PPO | Admitting: Nurse Practitioner

## 2021-06-13 DIAGNOSIS — Z01419 Encounter for gynecological examination (general) (routine) without abnormal findings: Secondary | ICD-10-CM | POA: Diagnosis not present

## 2021-06-13 DIAGNOSIS — Z01411 Encounter for gynecological examination (general) (routine) with abnormal findings: Secondary | ICD-10-CM | POA: Diagnosis not present

## 2021-06-13 DIAGNOSIS — N926 Irregular menstruation, unspecified: Secondary | ICD-10-CM | POA: Diagnosis not present

## 2021-06-13 DIAGNOSIS — Z1239 Encounter for other screening for malignant neoplasm of breast: Secondary | ICD-10-CM | POA: Diagnosis not present

## 2021-06-13 DIAGNOSIS — Z6841 Body Mass Index (BMI) 40.0 and over, adult: Secondary | ICD-10-CM | POA: Diagnosis not present

## 2021-06-13 DIAGNOSIS — Z1231 Encounter for screening mammogram for malignant neoplasm of breast: Secondary | ICD-10-CM | POA: Diagnosis not present

## 2021-07-19 ENCOUNTER — Encounter: Payer: Self-pay | Admitting: Nurse Practitioner

## 2021-07-19 DIAGNOSIS — M1990 Unspecified osteoarthritis, unspecified site: Secondary | ICD-10-CM

## 2021-07-19 MED ORDER — CELECOXIB 200 MG PO CAPS: 200.0000 mg | ORAL_CAPSULE | Freq: Two times a day (BID) | ORAL | 1 refills | Status: DC

## 2021-07-20 DIAGNOSIS — K080 Exfoliation of teeth due to systemic causes: Secondary | ICD-10-CM | POA: Diagnosis not present

## 2021-07-28 ENCOUNTER — Ambulatory Visit: Payer: Federal, State, Local not specified - PPO | Admitting: Nurse Practitioner

## 2021-08-04 DIAGNOSIS — N939 Abnormal uterine and vaginal bleeding, unspecified: Secondary | ICD-10-CM | POA: Diagnosis not present

## 2021-08-04 DIAGNOSIS — Z3040 Encounter for surveillance of contraceptives, unspecified: Secondary | ICD-10-CM | POA: Diagnosis not present

## 2021-08-15 DIAGNOSIS — G4733 Obstructive sleep apnea (adult) (pediatric): Secondary | ICD-10-CM | POA: Diagnosis not present

## 2021-08-16 ENCOUNTER — Encounter (INDEPENDENT_AMBULATORY_CARE_PROVIDER_SITE_OTHER): Payer: Self-pay

## 2021-08-24 ENCOUNTER — Telehealth (HOSPITAL_COMMUNITY): Payer: Self-pay | Admitting: Psychiatry

## 2021-08-31 ENCOUNTER — Telehealth (HOSPITAL_BASED_OUTPATIENT_CLINIC_OR_DEPARTMENT_OTHER): Payer: Federal, State, Local not specified - PPO | Admitting: Psychiatry

## 2021-08-31 ENCOUNTER — Encounter (HOSPITAL_COMMUNITY): Payer: Self-pay | Admitting: Psychiatry

## 2021-08-31 DIAGNOSIS — F411 Generalized anxiety disorder: Secondary | ICD-10-CM | POA: Diagnosis not present

## 2021-08-31 DIAGNOSIS — F332 Major depressive disorder, recurrent severe without psychotic features: Secondary | ICD-10-CM

## 2021-08-31 MED ORDER — FLUOXETINE HCL 20 MG PO CAPS: ORAL_CAPSULE | ORAL | 1 refills | Status: DC

## 2021-08-31 MED ORDER — BUSPIRONE HCL 10 MG PO TABS: 10.0000 mg | ORAL_TABLET | Freq: Three times a day (TID) | ORAL | 1 refills | Status: DC

## 2021-08-31 NOTE — Progress Notes (Signed)
Virtual Visit via Video Note  I connected with [name] on 08/31/21 at  8:30 AM EDT by   a video enabled telemedicine application and verified that I am speaking with the correct person using two identifiers.  Location: Patient: home Provider: office   I discussed the limitations of evaluation and management by telemedicine and the availability of in person appointments. The patient expressed understanding and agreed to proceed.  History of Present Illness: [name] is doing well. She was recently promoted and is very happy with the new position and the raise in pay. Home life is good. Her anxiety is well controlled with Buspar. The anxiety is mostly situational or if she skips a dose of Buspar. It passes quickly and distraction helps. She denies depression, anhedonia and hopelessness. [name] denies SI/HI. Her sleep is great with her CPAP. She has rare bad dreams.   Observations/Objective: Psychiatric Specialty Exam: ROS  unknown if currently breastfeeding.There is no height or weight on file to calculate BMI.  General Appearance: Casual and Fairly Groomed  Eye Contact:  Good  Speech:  Clear and Coherent and Normal Rate  Volume:  Normal  Mood:  Euthymic  Affect:  Full Range  Thought Process:  Goal Directed, Linear, and Descriptions of Associations: Intact  Orientation:  Full (Time, Place, and Person)  Thought Content:  Logical  Suicidal Thoughts:  No  Homicidal Thoughts:  No  Memory:  Immediate;   Good  Judgement:  Good  Insight:  Good  Psychomotor Activity:  Normal  Concentration:  Concentration: Good  Recall:  Good  Fund of Knowledge:  Good  Language:  Good  Akathisia:  No  Handed:  Right  AIMS (if indicated):     Assets:  Communication Skills Desire for Improvement Financial Resources/Insurance Housing Intimacy Leisure Time Resilience Social Support Talents/Skills Transportation Vocational/Educational  ADL's:  Intact  Cognition:  WNL  Sleep:         Assessment and Plan:     08/31/2021    8:39 AM 04/20/2021    9:49 AM 03/09/2021    8:39 AM 10/06/2020    8:43 AM 09/29/2020    8:42 AM  Depression screen PHQ 2/9  Decreased Interest 0 0 0 0 0  Down, Depressed, Hopeless 0 1 0 0 0  PHQ - 2 Score 0 1 0 0 0  Altered sleeping  2  0   Tired, decreased energy  2  0   Change in appetite  0  0   Feeling bad or failure about yourself   1  0   Trouble concentrating  1  0   Moving slowly or fidgety/restless  0  0   Suicidal thoughts  0  0   PHQ-9 Score  7  0   Difficult doing work/chores  Somewhat difficult       Flowsheet Row Video Visit from 08/31/2021 in [facility] Video Visit from 03/09/2021 in [facility] Video Visit from 09/29/2020 in [facility] No Risk No Risk No Risk         Pt is aware that these meds carry a teratogenic risk. Pt will discuss plan of action if she does or plans to become pregnant in the future.  Status of current problems: stable  Meds:  1. GAD (generalized anxiety disorder) - busPIRone (BUSPAR) 10 MG tablet; Take 1 tablet (10 mg total) by mouth 3 (three) times daily. For mood control  Dispense: 270 tablet; Refill: 1 - FLUoxetine (PROZAC) 20 MG capsule; TAKE 3 CAPSULES BY MOUTH DAILY FOR MOOD CONTROL  Dispense: 270 capsule; Refill: 1  2. Severe episode of recurrent major depressive disorder, without psychotic features (HCC) - FLUoxetine (PROZAC) 20 MG capsule; TAKE 3 CAPSULES BY MOUTH DAILY FOR MOOD CONTROL  Dispense: 270 capsule; Refill: 1     Labs: none    Therapy: brief supportive therapy provided.     Collaboration of Care: Other none  Patient/Guardian was advised Release of Information must be obtained prior to any record release in order to collaborate their care with an outside provider. Patient/Guardian was advised if they have not already done so to  contact the registration department to sign all necessary forms in order for [place] to release information regarding their care.   Consent: Patient/Guardian gives verbal consent for treatment and assignment of benefits for services provided during this visit. Patient/Guardian expressed understanding and agreed to proceed.       Follow Up Instructions: Follow up in 5-6 months or sooner if needed    I discussed the assessment and treatment plan with the patient. The patient was provided an opportunity to ask questions and all were answered. The patient agreed with the plan and demonstrated an understanding of the instructions.   The patient was advised to call back or seek an in-person evaluation if the symptoms worsen or if the condition fails to improve as anticipated.  I provided 13 minutes of non-face-to-face time during this encounter.   Charlcie Cradle, MD

## 2021-09-01 ENCOUNTER — Ambulatory Visit: Payer: Federal, State, Local not specified - PPO | Admitting: Nurse Practitioner

## 2021-09-07 ENCOUNTER — Ambulatory Visit: Payer: Federal, State, Local not specified - PPO | Admitting: Nurse Practitioner

## 2021-09-07 ENCOUNTER — Encounter: Payer: Self-pay | Admitting: Nurse Practitioner

## 2021-09-07 VITALS — BP 124/82 | HR 72 | Temp 98.6°F | Ht 63.0 in | Wt 308.1 lb

## 2021-09-07 DIAGNOSIS — G43909 Migraine, unspecified, not intractable, without status migrainosus: Secondary | ICD-10-CM | POA: Insufficient documentation

## 2021-09-07 DIAGNOSIS — R7303 Prediabetes: Secondary | ICD-10-CM | POA: Diagnosis not present

## 2021-09-07 DIAGNOSIS — A6000 Herpesviral infection of urogenital system, unspecified: Secondary | ICD-10-CM | POA: Insufficient documentation

## 2021-09-07 DIAGNOSIS — N921 Excessive and frequent menstruation with irregular cycle: Secondary | ICD-10-CM | POA: Insufficient documentation

## 2021-09-07 DIAGNOSIS — Z8659 Personal history of other mental and behavioral disorders: Secondary | ICD-10-CM | POA: Insufficient documentation

## 2021-09-07 DIAGNOSIS — I10 Essential (primary) hypertension: Secondary | ICD-10-CM | POA: Insufficient documentation

## 2021-09-07 DIAGNOSIS — K219 Gastro-esophageal reflux disease without esophagitis: Secondary | ICD-10-CM | POA: Insufficient documentation

## 2021-09-07 DIAGNOSIS — E785 Hyperlipidemia, unspecified: Secondary | ICD-10-CM

## 2021-09-07 DIAGNOSIS — R110 Nausea: Secondary | ICD-10-CM | POA: Insufficient documentation

## 2021-09-07 DIAGNOSIS — R079 Chest pain, unspecified: Secondary | ICD-10-CM

## 2021-09-07 DIAGNOSIS — N926 Irregular menstruation, unspecified: Secondary | ICD-10-CM | POA: Insufficient documentation

## 2021-09-07 DIAGNOSIS — G478 Other sleep disorders: Secondary | ICD-10-CM | POA: Insufficient documentation

## 2021-09-07 DIAGNOSIS — Z889 Allergy status to unspecified drugs, medicaments and biological substances status: Secondary | ICD-10-CM | POA: Insufficient documentation

## 2021-09-07 DIAGNOSIS — N898 Other specified noninflammatory disorders of vagina: Secondary | ICD-10-CM | POA: Insufficient documentation

## 2021-09-07 MED ORDER — ATORVASTATIN CALCIUM 10 MG PO TABS
10.0000 mg | ORAL_TABLET | Freq: Every day | ORAL | 1 refills | Status: DC
Start: 2021-09-07 — End: 2022-05-24

## 2021-09-07 NOTE — Assessment & Plan Note (Signed)
Risk versus benefit discussion of initiating statin therapy.  Patient agreeable to starting statin, atorvastatin 10 mg by mouth at bedtime prescribed.  Patient will follow-up in 6 weeks for lab draw to check fasting lipid panel as well as check metabolic panel.  Further recommendations may be made based on those results.  Patient told to notify me if she does not tolerate side effects.  Patient reports understanding.

## 2021-09-07 NOTE — Assessment & Plan Note (Signed)
Not occurring as frequently, patient encouraged to let me know if it still bothers her and she can have second opinion from cardiology if desired. encouraged patient to continue with healthy lifestyle including healthy diet and exercise aimed at losing weight. Patient reports understanding.

## 2021-09-07 NOTE — Progress Notes (Signed)
Established Patient Office Visit  Subjective   Patient ID: [name], female    DOB: [birth year]  Age: 41 y.o. MRN: [id number]  Chief Complaint  Patient presents with   Follow-up    3 month follow up    Hyperlipidemia    Prediabetes/obesity: Last A1c 6.3, patient is on metformin for off label treatment of this.  She is also been focusing on lifestyle with attempt to lose weight.  She just got back from work trip where she walked frequently.  She is also by near fryer.  She has seen nutritionist in the past and is not interested in revisiting this at this time.  Hyperlipidemia: ASCVD risk were greater than 10%.  Due to discussed statin therapy for risk modification today.  Last LDL 144.  Chest pain: Refer to cardiology, EKG completed which was normal.  No further work-up recommended at that time.  Patient reports she still does have chest pain and feels palpitations at times but the symptoms seem to be occurring less frequently and are not bothersome to her at this time.    Review of Systems  Constitutional:  Negative for fever.  Respiratory:  Negative for shortness of breath.   Cardiovascular:  Positive for chest pain and palpitations.      Objective:     BP 124/82 (BP Location: Left Arm, Patient Position: Sitting, Cuff Size: Large)   Pulse 72   Temp 98.6 F (37 C) (Oral)   Ht '5\' 3"'$  (1.6 m)   Wt (!) 308 lb 2 oz (139.8 kg)   LMP 08/17/2021   SpO2 98%   BMI 54.58 kg/m  BP Readings from Last 3 Encounters:  09/07/21 124/82  05/08/21 118/70  04/20/21 120/80   Wt Readings from Last 3 Encounters:  09/07/21 (!) 308 lb 2 oz (139.8 kg)  05/08/21 (!) 316 lb 3.2 oz (143.4 kg)  04/20/21 (!) 312 lb (141.5 kg)      Physical Exam   No results found for any visits on 09/07/21.    The 10-year ASCVD risk score (Arnett DK, et al., 2019) is: 11.9%    Assessment & Plan:   Problem List Items Addressed This Visit       Other   Hyperlipidemia    Risk versus benefit  discussion of initiating statin therapy.  Patient agreeable to starting statin, atorvastatin 10 mg by mouth at bedtime prescribed.  Patient will follow-up in 6 weeks for lab draw to check fasting lipid panel as well as check metabolic panel.  Further recommendations may be made based on those results.  Patient told to notify me if she does not tolerate side effects.  Patient reports understanding.      Relevant Medications   atorvastatin (LIPITOR) 10 MG tablet   Other Relevant Orders   Lipid panel   Comprehensive metabolic panel   Chest pain    Not occurring as frequently, patient encouraged to let me know if it still bothers her and she can have second opinion from cardiology if desired. encouraged patient to continue with healthy lifestyle including healthy diet and exercise aimed at losing weight. Patient reports understanding.       Prediabetes - Primary    Chronic, patient tolerating metformin well.  We will plan on rechecking A1c when she comes back in 6 weeks for lipid and metabolic panel check.  For now patient to continue on metformin 500 mg by mouth daily.      Relevant Orders   Hemoglobin A1c  Return in about 3 months (around 12/07/2021) for F/u with Judson Roch.    Ailene Ards, NP

## 2021-09-07 NOTE — Assessment & Plan Note (Signed)
Chronic, patient tolerating metformin well.  We will plan on rechecking A1c when she comes back in 6 weeks for lipid and metabolic panel check.  For now patient to continue on metformin 500 mg by mouth daily.

## 2021-09-13 ENCOUNTER — Encounter: Payer: Self-pay | Admitting: Nurse Practitioner

## 2021-10-19 ENCOUNTER — Other Ambulatory Visit: Payer: Federal, State, Local not specified - PPO

## 2021-10-22 ENCOUNTER — Other Ambulatory Visit: Payer: Self-pay | Admitting: Nurse Practitioner

## 2021-10-22 DIAGNOSIS — R7303 Prediabetes: Secondary | ICD-10-CM

## 2021-10-25 ENCOUNTER — Other Ambulatory Visit (INDEPENDENT_AMBULATORY_CARE_PROVIDER_SITE_OTHER): Payer: Federal, State, Local not specified - PPO

## 2021-10-25 DIAGNOSIS — E785 Hyperlipidemia, unspecified: Secondary | ICD-10-CM | POA: Diagnosis not present

## 2021-10-25 DIAGNOSIS — R7303 Prediabetes: Secondary | ICD-10-CM

## 2021-10-25 LAB — LIPID PANEL
Cholesterol: 184 mg/dL (ref 0–200)
HDL: 43.2 mg/dL (ref >39.00)
LDL Cholesterol: 113 mg/dL — ABNORMAL HIGH (ref 0–99)
NonHDL: 141.19
Total CHOL/HDL Ratio: 4
Triglycerides: 141 mg/dL (ref 0.0–149.0)
VLDL: 28.2 mg/dL (ref 0.0–40.0)

## 2021-10-25 LAB — COMPREHENSIVE METABOLIC PANEL
ALT: 22 U/L (ref 0–35)
AST: 15 U/L (ref 0–37)
Albumin: 4 g/dL (ref 3.5–5.2)
Alkaline Phosphatase: 71 U/L (ref 39–117)
BUN: 12 mg/dL (ref 6–23)
CO2: 26 mEq/L (ref 19–32)
Calcium: 9.4 mg/dL (ref 8.4–10.5)
Chloride: 104 mEq/L (ref 96–112)
Creatinine, Ser: 0.83 mg/dL (ref 0.40–1.20)
GFR: 87.14 mL/min (ref >60.00)
Glucose, Bld: 112 mg/dL — ABNORMAL HIGH (ref 70–99)
Potassium: 4.2 mEq/L (ref 3.5–5.1)
Sodium: 138 mEq/L (ref 135–145)
Total Bilirubin: 0.3 mg/dL (ref 0.2–1.2)
Total Protein: 6.9 g/dL (ref 6.0–8.3)

## 2021-10-25 LAB — HEMOGLOBIN A1C: Hgb A1c MFr Bld: 6.3 % (ref 4.6–6.5)

## 2021-11-09 ENCOUNTER — Encounter: Payer: Self-pay | Admitting: Nurse Practitioner

## 2021-11-10 ENCOUNTER — Other Ambulatory Visit: Payer: Self-pay

## 2021-11-10 DIAGNOSIS — M1990 Unspecified osteoarthritis, unspecified site: Secondary | ICD-10-CM

## 2021-11-10 DIAGNOSIS — H8110 Benign paroxysmal vertigo, unspecified ear: Secondary | ICD-10-CM

## 2021-11-10 MED ORDER — MECLIZINE HCL 25 MG PO TABS: 25.0000 mg | ORAL_TABLET | Freq: Three times a day (TID) | ORAL | 1 refills | Status: DC | PRN

## 2021-11-10 MED ORDER — CELECOXIB 200 MG PO CAPS: 200.0000 mg | ORAL_CAPSULE | Freq: Two times a day (BID) | ORAL | 1 refills | Status: DC

## 2021-11-10 NOTE — Telephone Encounter (Signed)
Medication sent to pharmacy and let pt know to call and pick

## 2021-11-13 DIAGNOSIS — G4733 Obstructive sleep apnea (adult) (pediatric): Secondary | ICD-10-CM | POA: Diagnosis not present

## 2021-11-21 ENCOUNTER — Other Ambulatory Visit: Payer: Self-pay | Admitting: *Deleted

## 2021-11-21 ENCOUNTER — Other Ambulatory Visit: Payer: Self-pay

## 2021-11-21 DIAGNOSIS — M1990 Unspecified osteoarthritis, unspecified site: Secondary | ICD-10-CM

## 2021-11-21 DIAGNOSIS — R7303 Prediabetes: Secondary | ICD-10-CM

## 2021-11-21 NOTE — Telephone Encounter (Signed)
Patient called to follow up on these 2 medications because the pharmacy said they still havent received them. Please look into and send in

## 2021-11-22 ENCOUNTER — Other Ambulatory Visit: Payer: Self-pay

## 2021-11-22 DIAGNOSIS — M1990 Unspecified osteoarthritis, unspecified site: Secondary | ICD-10-CM

## 2021-11-22 DIAGNOSIS — R7303 Prediabetes: Secondary | ICD-10-CM

## 2021-11-22 MED ORDER — METFORMIN HCL 500 MG PO TABS: ORAL_TABLET | ORAL | 1 refills | Status: DC

## 2021-11-22 MED ORDER — CELECOXIB 200 MG PO CAPS: 200.0000 mg | ORAL_CAPSULE | Freq: Two times a day (BID) | ORAL | 1 refills | Status: DC

## 2021-11-22 NOTE — Telephone Encounter (Signed)
Called pt. Pt stated she got her metformin but not the celecoxib, let pt know that her pharmacy is not accepting this medication through fax, let her know one provider sign the hard copy of it we will fax it to pharmacy that way

## 2021-11-23 NOTE — Telephone Encounter (Signed)
Sent signed hard copy Celebrex Rx to [facility] on [address], received confirmation fax

## 2021-12-07 ENCOUNTER — Ambulatory Visit (INDEPENDENT_AMBULATORY_CARE_PROVIDER_SITE_OTHER): Payer: Federal, State, Local not specified - PPO | Admitting: Nurse Practitioner

## 2021-12-07 VITALS — BP 126/84 | HR 76 | Temp 97.6°F | Ht 63.0 in | Wt 310.0 lb

## 2021-12-07 DIAGNOSIS — E785 Hyperlipidemia, unspecified: Secondary | ICD-10-CM | POA: Diagnosis not present

## 2021-12-07 DIAGNOSIS — F17200 Nicotine dependence, unspecified, uncomplicated: Secondary | ICD-10-CM

## 2021-12-07 DIAGNOSIS — Z6841 Body Mass Index (BMI) 40.0 and over, adult: Secondary | ICD-10-CM

## 2021-12-07 DIAGNOSIS — K219 Gastro-esophageal reflux disease without esophagitis: Secondary | ICD-10-CM

## 2021-12-07 DIAGNOSIS — R7303 Prediabetes: Secondary | ICD-10-CM

## 2021-12-07 NOTE — Progress Notes (Signed)
Established Patient Office Visit  Subjective   Patient ID: [name], female    DOB: [birth year]  Age: 41 y.o. MRN: [id number]  Chief Complaint  Patient presents with   Follow-up    Atorvastatin at night time has been hard for her and would like to see if she can take it in morning. Would also like lung cancer screening due to family hx of lung cancer and hx of her smoking   Hyperlipidemia    Patient her for the above:  HLD/Prediabetes/Obesity: on atorvastatin '10mg'$ /day, Often forgets to take it at night. LDL 113 at last check improvement from 144. A1C 6.3 which is stable. Weight has been stable.  Sore throat/GERD: Has noticed sore throat x 6 months. Has chronic GERD, not well controlled. Not taking medication for this as of right now. Lays down when resting at home on stomach with pillow under  neck, wonders if this could be contributing. No hoarseness, cough, unintentional weight loss.   Smoking: Current smoker, 0.33 PPD x 17 years (pack year 5.61). Concerned about lung cancer screening. No cough or shortness of breath. Has family history of lung cancer.     Review of Systems  Respiratory:  Negative for cough and shortness of breath.   Cardiovascular:  Negative for chest pain.  Gastrointestinal:  Positive for heartburn.      Objective:     BP 126/84 (BP Location: Left Arm, Patient Position: Sitting, Cuff Size: Large)   Pulse 76   Temp 97.6 F (36.4 C) (Temporal)   Ht '5\' 3"'$  (1.6 m)   Wt (!) 310 lb (140.6 kg)   SpO2 97%   BMI 54.91 kg/m  BP Readings from Last 3 Encounters:  12/07/21 126/84  09/07/21 124/82  05/08/21 118/70   Wt Readings from Last 3 Encounters:  12/07/21 (!) 310 lb (140.6 kg)  09/07/21 (!) 308 lb 2 oz (139.8 kg)  05/08/21 (!) 316 lb 3.2 oz (143.4 kg)      Physical Exam Vitals reviewed.  Constitutional:      General: She is not in acute distress.    Appearance: Normal appearance.  HENT:     Head: Normocephalic and atraumatic.  Neck:      Vascular: No carotid bruit.  Cardiovascular:     Rate and Rhythm: Normal rate and regular rhythm.     Pulses: Normal pulses.     Heart sounds: Normal heart sounds.  Pulmonary:     Effort: Pulmonary effort is normal.     Breath sounds: Normal breath sounds.  Skin:    General: Skin is warm and dry.  Neurological:     General: No focal deficit present.     Mental Status: She is alert and oriented to person, place, and time.  Psychiatric:        Mood and Affect: Mood normal.        Behavior: Behavior normal.        Judgment: Judgment normal.      No results found for any visits on 12/07/21.    The 10-year ASCVD risk score (Arnett DK, et al., 2019) is: 6.1%    Assessment & Plan:   Problem List Items Addressed This Visit       Digestive   Gastroesophageal reflux disease - Primary    Recommend OTC use of TUMS or pepcid as needed. Refer to GI to determine if she may be a candidate for endoscopy for further evaluation.       Relevant Orders  Ambulatory referral to Gastroenterology     Other   Class 3 severe obesity with serious comorbidity and body mass index (BMI) of 50.0 to 59.9 in adult [facility])    Focus on lifestyle modification aimed at weight loss.      Hyperlipidemia    Continue atorvastatin '10mg'$ /day, patient will start taking in the morning for compliance. Will check fasting lipid at next OV if LDL still > 100 consider increasing the dose. Patient to continue focusing on eating heathy diet as well.       Relevant Orders   Lipid panel   Hemoglobin A1c   Comprehensive metabolic panel   Prediabetes    Patient to focus on lifestyle modification, consider recheck at next OV.      Relevant Orders   Hemoglobin A1c   Smoker    Patient <33 years of age and pack year 5.61, no symptoms of lung cancer. Recommend against lung cancer screening at this time as she does not meet the USPSTF criteria. Reconsider if symptoms change.        Return in about 3 months  (around 03/08/2022) for F/u with Yama Nielson.    Ailene Ards, NP

## 2021-12-07 NOTE — Assessment & Plan Note (Signed)
Continue atorvastatin '10mg'$ /day, patient will start taking in the morning for compliance. Will check fasting lipid at next OV if LDL still > 100 consider increasing the dose. Patient to continue focusing on eating heathy diet as well.

## 2021-12-07 NOTE — Assessment & Plan Note (Signed)
Patient <42 years of age and pack year 5.61, no symptoms of lung cancer. Recommend against lung cancer screening at this time as she does not meet the USPSTF criteria. Reconsider if symptoms change.

## 2021-12-07 NOTE — Assessment & Plan Note (Signed)
Patient to focus on lifestyle modification, consider recheck at next OV.

## 2021-12-07 NOTE — Assessment & Plan Note (Signed)
Recommend OTC use of TUMS or pepcid as needed. Refer to GI to determine if she may be a candidate for endoscopy for further evaluation.

## 2021-12-07 NOTE — Assessment & Plan Note (Signed)
Focus on lifestyle modification aimed at weight loss.

## 2021-12-26 ENCOUNTER — Other Ambulatory Visit: Payer: Self-pay | Admitting: Internal Medicine

## 2022-01-30 ENCOUNTER — Other Ambulatory Visit: Payer: Self-pay | Admitting: Obstetrics and Gynecology

## 2022-01-30 DIAGNOSIS — N939 Abnormal uterine and vaginal bleeding, unspecified: Secondary | ICD-10-CM | POA: Diagnosis not present

## 2022-01-30 DIAGNOSIS — Z0130 Encounter for examination of blood pressure without abnormal findings: Secondary | ICD-10-CM | POA: Diagnosis not present

## 2022-02-09 ENCOUNTER — Other Ambulatory Visit: Payer: Self-pay | Admitting: Nurse Practitioner

## 2022-02-09 ENCOUNTER — Telehealth: Payer: Self-pay | Admitting: Nurse Practitioner

## 2022-02-09 DIAGNOSIS — M1990 Unspecified osteoarthritis, unspecified site: Secondary | ICD-10-CM

## 2022-02-09 MED ORDER — CELECOXIB 100 MG PO CAPS: 100.0000 mg | ORAL_CAPSULE | Freq: Two times a day (BID) | ORAL | 3 refills | Status: DC

## 2022-02-09 NOTE — Telephone Encounter (Signed)
Patient is informed.

## 2022-02-09 NOTE — Telephone Encounter (Signed)
Patient called states her insurance company said she has maxed out for the year for her celecoxib '200mg'$  prescription. She would like a paper prescription so she can find somewhere cheap to fill it for her. If any questions best callback number is [phone].

## 2022-02-12 DIAGNOSIS — G4733 Obstructive sleep apnea (adult) (pediatric): Secondary | ICD-10-CM | POA: Diagnosis not present

## 2022-02-15 ENCOUNTER — Telehealth (HOSPITAL_COMMUNITY): Payer: Federal, State, Local not specified - PPO | Admitting: Psychiatry

## 2022-02-22 ENCOUNTER — Telehealth (HOSPITAL_BASED_OUTPATIENT_CLINIC_OR_DEPARTMENT_OTHER): Payer: Federal, State, Local not specified - PPO | Admitting: Psychiatry

## 2022-02-22 DIAGNOSIS — F411 Generalized anxiety disorder: Secondary | ICD-10-CM

## 2022-02-22 DIAGNOSIS — F332 Major depressive disorder, recurrent severe without psychotic features: Secondary | ICD-10-CM

## 2022-02-22 MED ORDER — BUSPIRONE HCL 10 MG PO TABS: 10.0000 mg | ORAL_TABLET | Freq: Three times a day (TID) | ORAL | 1 refills | Status: DC

## 2022-02-22 MED ORDER — FLUOXETINE HCL 20 MG PO CAPS: ORAL_CAPSULE | ORAL | 1 refills | Status: DC

## 2022-02-22 NOTE — Progress Notes (Signed)
Virtual Visit via Video Note  I connected with [name] on 02/22/22 at  9:15 AM EST by  a video enabled telemedicine application and verified that I am speaking with the correct person using two identifiers.  Location: Patient: home Provider: office   I discussed the limitations of evaluation and management by telemedicine and the availability of in person appointments. The patient expressed understanding and agreed to proceed.  History of Present Illness: "I'm doing pretty well I think, as far as my depression and anxiety go". [name] states she has not been depressed in a while. The last time was in the beginning of December and it was situational. She sleeps well most night and usually gets about 7-8 hrs/night. About 1-2 times a week for the last several weeks she wakes up in the middle of the night and is unable to fall back asleep. A sound will usually wake her up. Her appetite and energy are good. Work is going good and her is focus is fine. She denies SI/HI. Her anxiety is usually well controlled unless she misses her afternoon and evening dose of Buspar. The alarms are no longer helping.     Observations/Objective: Psychiatric Specialty Exam: ROS  unknown if currently breastfeeding.There is no height or weight on file to calculate BMI.  General Appearance: Neat and Well Groomed  Eye Contact:  Good  Speech:  Clear and Coherent and Normal Rate  Volume:  Normal  Mood:  Anxious  Affect:  Full Range  Thought Process:  Goal Directed, Linear, and Descriptions of Associations: Intact  Orientation:  Full (Time, Place, and Person)  Thought Content:  Logical  Suicidal Thoughts:  No  Homicidal Thoughts:  No  Memory:  Immediate;   Good  Judgement:  Good  Insight:  Good  Psychomotor Activity:  Normal  Concentration:  Concentration: Good  Recall:  Good  Fund of Knowledge:  Good  Language:  Good  Akathisia:  No  Handed:  Right  AIMS (if indicated):     Assets:  Communication  Skills Desire for Improvement Financial Resources/Insurance [place] Talents/Skills Transportation Vocational/Educational  ADL's:  Intact  Cognition:  WNL  Sleep:        Assessment and Plan:     02/22/2022    9:32 AM 12/07/2021    8:10 AM 09/07/2021    8:29 AM 08/31/2021    8:39 AM 04/20/2021    9:49 AM  Depression screen PHQ 2/9  Decreased Interest 0 1 0 0 0  Down, Depressed, Hopeless 0 0 0 0 1  PHQ - 2 Score 0 1 0 0 1  Altered sleeping  1 0  2  Tired, decreased energy  1 1  2  $ Change in appetite  0 1  0  Feeling bad or failure about yourself   0 0  1  Trouble concentrating  0 0  1  Moving slowly or fidgety/restless  0 0  0  Suicidal thoughts  0 0  0  PHQ-9 Score  3 2  7  $ Difficult doing work/chores  Not difficult at all Not difficult at all  Somewhat difficult    Flowsheet Row Video Visit from 02/22/2022 in [facility] Video Visit from 08/31/2021 in [facility] Video Visit from 03/09/2021 in [place] No Risk No Risk No Risk          Pt is aware that these meds  carry a teratogenic risk. Pt will discuss plan of action if she does or plans to become pregnant in the future.  Status of current problems: anxiety when she misses doses of Buspar. Depression is well controlled.    Medication management with supportive therapy. Risks and benefits, side effects and alternative treatment options discussed with patient. Pt was given an opportunity to ask questions about medication, illness, and treatment. All current psychiatric medications have been reviewed and discussed with the patient and adjusted as clinically appropriate.  Pt verbalized understanding and verbal consent obtained for treatment.  Meds:  1. GAD (generalized anxiety disorder) - busPIRone (BUSPAR) 10 MG tablet; Take 1 tablet (10 mg  total) by mouth 3 (three) times daily. For mood control  Dispense: 270 tablet; Refill: 1 - FLUoxetine (PROZAC) 20 MG capsule; TAKE 3 CAPSULES BY MOUTH DAILY FOR MOOD CONTROL  Dispense: 270 capsule; Refill: 1  2. Severe episode of recurrent major depressive disorder, without psychotic features (HCC) - FLUoxetine (PROZAC) 20 MG capsule; TAKE 3 CAPSULES BY MOUTH DAILY FOR MOOD CONTROL  Dispense: 270 capsule; Refill: 1     Therapy: brief supportive therapy provided. Discussed psychosocial stressors in detail.     Strategized ways to improve med compliance    Collaboration of Care: Other n/a  Patient/Guardian was advised Release of Information must be obtained prior to any record release in order to collaborate their care with an outside provider. Patient/Guardian was advised if they have not already done so to contact the registration department to sign all necessary forms in order for [place] to release information regarding their care.   Consent: Patient/Guardian gives verbal consent for treatment and assignment of benefits for services provided during this visit. Patient/Guardian expressed understanding and agreed to proceed.      Follow Up Instructions: Follow up in 6 months or sooner if needed    I discussed the assessment and treatment plan with the patient. The patient was provided an opportunity to ask questions and all were answered. The patient agreed with the plan and demonstrated an understanding of the instructions.   The patient was advised to call back or seek an in-person evaluation if the symptoms worsen or if the condition fails to improve as anticipated.  I provided 10 minutes of non-face-to-face time during this encounter.   Charlcie Cradle, MD

## 2022-02-27 NOTE — Progress Notes (Unsigned)
02/27/2022 [name] [id number] 07/26/1980   CHIEF COMPLAINT: Acid reflux  HISTORY OF PRESENT ILLNESS: [name] is a 42 year old female with a past medical history of anxiety, depression, eczema, hypertension, hyperlipidemia, sleep apnea uses CPAP and GERD. She presents to our office today as referred by Jeralyn Ruths NP for further evaluation regarding GERD. She endorses having heartburn most days for the past 6 months with associated burping. She describes feeling as if acid backs up into her mouth. She denies having any dysphagia. No upper or lower abdominal pain but she does feel gassy at times.  She passes a round soft to loose stool most days. No rectal bleeding or black stools. She denies taking any acid reducing medications. She takes Celebrex 100 mg twice daily for arthritis for a few years. She rarely takes ibuprofen. She drinks 2 large cups of coffee daily. She stated 50% of her meals are fast foods.  Work stress level is high.  + Smoker.  Rare alcohol use.  She was seen by cardiologist Dr. Phineas Inches 05/08/2021 due to having atypical chest pain which was felt not to be cardiac. Her EKG was normal and lifestyle modifications with weight loss was recommended.  No further cardiac evaluation was required.      Latest Ref Rng & Units 02/28/2022    8:04 AM 10/25/2021    7:54 AM 04/20/2021   11:22 AM  CMP  Glucose 70 - 99 mg/dL 131  112  86   BUN 6 - 23 mg/dL 11  12  12   $ Creatinine 0.40 - 1.20 mg/dL 0.95  0.83  0.82   Sodium 135 - 145 mEq/L 141  138  136   Potassium 3.5 - 5.1 mEq/L 4.2  4.2  4.1   Chloride 96 - 112 mEq/L 105  104  101   CO2 19 - 32 mEq/L 25  26  30   $ Calcium 8.4 - 10.5 mg/dL 9.5  9.4  9.4   Total Protein 6.0 - 8.3 g/dL 6.8  6.9    Total Bilirubin 0.2 - 1.2 mg/dL 0.3  0.3    Alkaline Phos 39 - 117 U/L 83  71    AST 0 - 37 U/L 13  15    ALT 0 - 35 U/L 21  22         Latest Ref Rng & Units 09/22/2020    9:39 AM 04/07/2019    9:37 AM 09/29/2018    10:25 AM  CBC  WBC 4.0 - 10.5 K/uL 7.4  6.9  6.7   Hemoglobin 12.0 - 15.0 g/dL 12.9  12.5  12.6   Hematocrit 36.0 - 46.0 % 39.1  37.2  37.8   Platelets 150.0 - 400.0 K/uL 276.0  256.0  292.0        Past Medical History:  Diagnosis Date   Anxiety    Back pain    Bilateral swelling of feet    Chest pain    Cluster headaches    Depression    Eczema    GERD (gastroesophageal reflux disease)    Gestational diabetes 2012, 2016   Hyperlipidemia    Hypertension    no meds currently   Joint pain    Osteoarthritis    PCOS (polycystic ovarian syndrome)    Pre-diabetes    Sciatica    Shortness of breath    Sleep apnea    Snoring    sleep study 02/2012 with min AHI  events   Vitamin D deficiency    Past Surgical History:  Procedure Laterality Date   CESAREAN SECTION  09 & 12   x's 2   CESAREAN SECTION WITH BILATERAL TUBAL LIGATION Bilateral 12/18/2014   Procedure: REPEAT CESAREAN SECTION WITH BILATERAL TUBAL LIGATION;  Surgeon: Crawford Givens, MD;  Location: [facility];  Service: Obstetrics;  Laterality: Bilateral;   COLPOSCOPY     DENTAL SURGERY     Ganglion removal from (L) wrist  1998   LAPAROSCOPIC UNILATERAL SALPINGECTOMY Left 01/26/2015   Procedure: LAPAROSCOPIC UNILATERAL SALPINGECTOMY;  Surgeon: Crawford Givens, MD;  Location: [facility];  Service: Gynecology;  Laterality: Left;   WISDOM TOOTH EXTRACTION     Social History:  She smokes cigarettes 1/3 pack per day cigarettes on/off x 20 years. Rare alcohol use.  No drug use.  Family History: family history includes Alcohol abuse in her father, mother, and another family member; Alcoholism in her father; Anxiety disorder in her mother and sister; Arthritis in her father, mother, and another family member; Breast cancer in her paternal aunt; Cancer (age of onset: 81) in her mother; Coronary artery disease (age of onset: 51) in her mother; Depression in her father and mother; Diabetes in her maternal grandmother and mother; Drug abuse in her  father; Heart disease in her father and maternal grandmother; Hyperlipidemia in her father and paternal grandfather; Hypertension in her father and paternal grandfather; Kidney disease in her father; Mental illness in her father and mother; Obesity in her mother; Stroke in her father and maternal grandfather.  Allergies  Allergen Reactions   Codeine     STOMACH CRAMPING   Doxycycline Nausea And Vomiting      Outpatient Encounter Medications as of 02/28/2022  Medication Sig   atorvastatin (LIPITOR) 10 MG tablet Take 1 tablet (10 mg total) by mouth at bedtime.   Biotin 5000 MCG CAPS Take 1 capsule by mouth.   busPIRone (BUSPAR) 10 MG tablet Take 1 tablet (10 mg total) by mouth 3 (three) times daily. For mood control   celecoxib (CELEBREX) 100 MG capsule Take 1 capsule (100 mg total) by mouth 2 (two) times daily.   cetirizine (ZYRTEC) 10 MG tablet Take 1 tablet (10 mg total) by mouth daily.   Cholecalciferol (VITAMIN D3) 50 MCG (2000 UT) TABS TAKE 1 TABLET BY MOUTH EVERY DAY   FLUoxetine (PROZAC) 20 MG capsule TAKE 3 CAPSULES BY MOUTH DAILY FOR MOOD CONTROL   guaiFENesin (MUCINEX) 600 MG 12 hr tablet Take 2 tablets (1,200 mg total) by mouth 2 (two) times daily as needed.   meclizine (ANTIVERT) 25 MG tablet Take 1 tablet (25 mg total) by mouth 3 (three) times daily as needed for dizziness.   metFORMIN (GLUCOPHAGE) 500 MG tablet TAKE 1/2 TABLET BY DAILY WITH FIRST MEAL FOR 2 WEEKS.THEN TAKE 1 TABLET DAILY WITH FIRST MEAL   No facility-administered encounter medications on file as of 02/28/2022.     REVIEW OF SYSTEMS:  Gen: + Night sweats. No weight loss.  CV: Denies chest pain, palpitations or edema. Resp: Denies cough, shortness of breath of hemoptysis.  GI:See HPI.  GU : Denies urinary burning, blood in urine, increased urinary frequency or incontinence. MS: + Back pain.  Derm: Denies rash, itchiness, skin lesions or unhealing ulcers. Psych: + Anxiety and depression.  Heme: Denies  bruising, easy bleeding. Neuro:  Denies headaches, dizziness or paresthesias. Endo:  Denies any problems with DM, thyroid or adrenal function.  PHYSICAL EXAM: There were no vitals taken  for this visit. BP 120/84 (BP Location: Left Arm, Patient Position: Sitting, Cuff Size: Large)   Pulse 76   Ht 5' 2.75" (1.594 m) Comment: height measured without shoes  Wt (!) 306 lb 8 oz (139 kg)   LMP 02/20/2022   BMI 54.73 kg/m   General: 42 year old female in no acute distress. Head: Normocephalic and atraumatic. Eyes:  Sclerae non-icteric, conjunctive pink. Ears: Normal auditory acuity. Mouth: Dentition intact. No ulcers or lesions.  Neck: Supple, no lymphadenopathy or thyromegaly.  Lungs: Clear bilaterally to auscultation without wheezes, crackles or rhonchi. Heart: Regular rate and rhythm. No murmur, rub or gallop appreciated.  Abdomen: Soft, nontender, nondistended. No masses. No hepatosplenomegaly. Normoactive bowel sounds x 4 quadrants.  Rectal:  Musculoskeletal: Symmetrical with no gross deformities. Skin: Warm and dry. No rash or lesions on visible extremities. Extremities: No edema. Neurological: Alert oriented x 4, no focal deficits.  Psychological:  Alert and cooperative. Normal mood and affect.  ASSESSMENT AND PLAN:  54) 42 year old female with acid reflux symptoms x 6 months -GERD diet gust, handout provided -Exercise as tolerated, lose weight -Reduce caffeine intake -Omeprazole 40 mg daily, may add famotidine 20 mg 1 tab nightly OTC as needed -Patient to follow-up in the office in 4 to 6 weeks -I discussed scheduling an EGD to rule out GERD/Barrett's esophagus if her symptoms persist or worsen    CC:  Ailene Ards, NP

## 2022-02-28 ENCOUNTER — Encounter: Payer: Self-pay | Admitting: Nurse Practitioner

## 2022-02-28 ENCOUNTER — Ambulatory Visit: Payer: Federal, State, Local not specified - PPO | Admitting: Nurse Practitioner

## 2022-02-28 ENCOUNTER — Other Ambulatory Visit (INDEPENDENT_AMBULATORY_CARE_PROVIDER_SITE_OTHER): Payer: Federal, State, Local not specified - PPO

## 2022-02-28 VITALS — BP 120/84 | HR 76 | Ht 62.75 in | Wt 306.5 lb

## 2022-02-28 DIAGNOSIS — K219 Gastro-esophageal reflux disease without esophagitis: Secondary | ICD-10-CM

## 2022-02-28 DIAGNOSIS — E785 Hyperlipidemia, unspecified: Secondary | ICD-10-CM

## 2022-02-28 DIAGNOSIS — R7303 Prediabetes: Secondary | ICD-10-CM

## 2022-02-28 LAB — LIPID PANEL
Cholesterol: 177 mg/dL (ref 0–200)
HDL: 46.4 mg/dL (ref >39.00)
LDL Cholesterol: 103 mg/dL — ABNORMAL HIGH (ref 0–99)
NonHDL: 130.85
Total CHOL/HDL Ratio: 4
Triglycerides: 138 mg/dL (ref 0.0–149.0)
VLDL: 27.6 mg/dL (ref 0.0–40.0)

## 2022-02-28 LAB — COMPREHENSIVE METABOLIC PANEL
ALT: 21 U/L (ref 0–35)
AST: 13 U/L (ref 0–37)
Albumin: 4 g/dL (ref 3.5–5.2)
Alkaline Phosphatase: 83 U/L (ref 39–117)
BUN: 11 mg/dL (ref 6–23)
CO2: 25 mEq/L (ref 19–32)
Calcium: 9.5 mg/dL (ref 8.4–10.5)
Chloride: 105 mEq/L (ref 96–112)
Creatinine, Ser: 0.95 mg/dL (ref 0.40–1.20)
GFR: 73.93 mL/min (ref >60.00)
Glucose, Bld: 131 mg/dL — ABNORMAL HIGH (ref 70–99)
Potassium: 4.2 mEq/L (ref 3.5–5.1)
Sodium: 141 mEq/L (ref 135–145)
Total Bilirubin: 0.3 mg/dL (ref 0.2–1.2)
Total Protein: 6.8 g/dL (ref 6.0–8.3)

## 2022-02-28 LAB — HEMOGLOBIN A1C: Hgb A1c MFr Bld: 6.6 % — ABNORMAL HIGH (ref 4.6–6.5)

## 2022-02-28 MED ORDER — OMEPRAZOLE 40 MG PO CPDR: 40.0000 mg | DELAYED_RELEASE_CAPSULE | Freq: Every day | ORAL | 2 refills | Status: DC

## 2022-02-28 NOTE — Patient Instructions (Addendum)
We have sent the following medications to your pharmacy for you to pick up at your convenience: Omeprazole 40 mg  Pepcid 20 mg over the counter-  1 by mouth at bedtime as needed  We have scheduled your follow up appointment for 04/18/22 at 11 am with Jaclyn Shaggy, NP.  Thank you for trusting me with your gastrointestinal care!   Carl Best, CRNP

## 2022-02-28 NOTE — Progress Notes (Signed)
Agree with assessment and plan as outlined.  

## 2022-03-01 ENCOUNTER — Other Ambulatory Visit: Payer: Federal, State, Local not specified - PPO

## 2022-03-03 ENCOUNTER — Other Ambulatory Visit: Payer: Self-pay | Admitting: Nurse Practitioner

## 2022-03-03 DIAGNOSIS — E785 Hyperlipidemia, unspecified: Secondary | ICD-10-CM

## 2022-03-08 ENCOUNTER — Ambulatory Visit (INDEPENDENT_AMBULATORY_CARE_PROVIDER_SITE_OTHER): Payer: Federal, State, Local not specified - PPO | Admitting: Nurse Practitioner

## 2022-03-08 VITALS — BP 112/80 | HR 70 | Temp 97.9°F | Ht 62.75 in | Wt 306.4 lb

## 2022-03-08 DIAGNOSIS — E119 Type 2 diabetes mellitus without complications: Secondary | ICD-10-CM

## 2022-03-08 DIAGNOSIS — Z6841 Body Mass Index (BMI) 40.0 and over, adult: Secondary | ICD-10-CM

## 2022-03-08 DIAGNOSIS — E785 Hyperlipidemia, unspecified: Secondary | ICD-10-CM

## 2022-03-08 DIAGNOSIS — H8110 Benign paroxysmal vertigo, unspecified ear: Secondary | ICD-10-CM

## 2022-03-08 NOTE — Assessment & Plan Note (Signed)
Chronic, discussed possibly increasing dose to 20 mg by mouth daily, for now patient would like to hold off and just continue on the 10 mg mouth daily.

## 2022-03-08 NOTE — Assessment & Plan Note (Signed)
Intermittent, for now patient to take meclizine as needed.  She was told if she experiences more frequent vertigo, longer lasting vertigo, hearing loss, or worsening ringing of the ears to notify me at which point we may need evaluation with specialist.

## 2022-03-08 NOTE — Patient Instructions (Signed)
Semaglutide - Ozempic Tirzepatide - [employer]

## 2022-03-08 NOTE — Assessment & Plan Note (Signed)
Chronic, for now patient to focus on lifestyle modification.

## 2022-03-08 NOTE — Progress Notes (Signed)
   Established Patient Office Visit  Subjective   Patient ID: [name], female    DOB: 1980/08/05  Age: 42 y.o. MRN: [id number]  Chief Complaint  Patient presents with   Diabetes   Diabetes/hyperlipidemia: Newly diagnosed diabetic with A1c of 6.6.  Has been prediabetic previously.  Currently on metformin 500 mg daily.  On atorvastatin 10 mg mouth daily, last LDL 103.  Also reports intermittent vertigo and what she describes as possibly intermittent tinnitus.  Dizziness occurs for a few moments before resolving spontaneously on its own.  Has had prolonged vertigo in the past.  Does take meclizine only if needed.  Denies hearing loss.      Review of Systems  HENT:  Positive for tinnitus. Negative for hearing loss.   Eyes:  Positive for blurred vision. Negative for double vision.  Respiratory:  Negative for shortness of breath.   Cardiovascular:  Negative for chest pain.  Neurological:  Negative for dizziness and loss of consciousness.      Objective:     BP 112/80   Pulse 70   Temp 97.9 F (36.6 C) (Temporal)   Ht 5' 2.75" (1.594 m)   Wt (!) 306 lb 6 oz (139 kg)   LMP 02/20/2022   SpO2 98%   BMI 54.71 kg/m    Physical Exam   No results found for any visits on 03/08/22.    The 10-year ASCVD risk score (Arnett DK, et al., 2019) is: 4%    Assessment & Plan:   Problem List Items Addressed This Visit       Endocrine   Diabetes mellitus without complication ([place])    Newly diagnosed, discussed possibly starting GLP-1 agonist.  For now patient would like to hold off and will focus on lifestyle modification but will continue metformin.  Patient to continue statin therapy.  Will check for albuminuria prior to next appointment, may consider addition of ACE or ARB pending those results.      Relevant Orders   TSH   Hemoglobin A1c   Lipid panel   Comprehensive metabolic panel   CBC   Microalbumin / creatinine urine ratio     Nervous and Auditory   Benign  paroxysmal positional vertigo    Intermittent, for now patient to take meclizine as needed.  She was told if she experiences more frequent vertigo, longer lasting vertigo, hearing loss, or worsening ringing of the ears to notify me at which point we may need evaluation with specialist.        Other   Class 3 severe obesity with serious comorbidity and body mass index (BMI) of 50.0 to 59.9 in adult [facility])    Chronic, for now patient to focus on lifestyle modification.      Relevant Orders   TSH   Hemoglobin A1c   Lipid panel   Comprehensive metabolic panel   CBC   Microalbumin / creatinine urine ratio   Hyperlipidemia - Primary    Chronic, discussed possibly increasing dose to 20 mg by mouth daily, for now patient would like to hold off and just continue on the 10 mg mouth daily.      Relevant Orders   TSH   Hemoglobin A1c   Lipid panel   Comprehensive metabolic panel   CBC   Microalbumin / creatinine urine ratio    Return in about 3 months (around 06/06/2022) for CPE with Judson Roch.    Ailene Ards, NP

## 2022-03-08 NOTE — Assessment & Plan Note (Signed)
Newly diagnosed, discussed possibly starting GLP-1 agonist.  For now patient would like to hold off and will focus on lifestyle modification but will continue metformin.  Patient to continue statin therapy.  Will check for albuminuria prior to next appointment, may consider addition of ACE or ARB pending those results.

## 2022-03-13 ENCOUNTER — Encounter: Payer: Self-pay | Admitting: Nurse Practitioner

## 2022-03-15 ENCOUNTER — Other Ambulatory Visit: Payer: Self-pay | Admitting: Nurse Practitioner

## 2022-03-15 DIAGNOSIS — E119 Type 2 diabetes mellitus without complications: Secondary | ICD-10-CM

## 2022-03-15 MED ORDER — OZEMPIC (0.25 OR 0.5 MG/DOSE) 2 MG/3ML ~~LOC~~ SOPN: PEN_INJECTOR | SUBCUTANEOUS | 1 refills | Status: DC

## 2022-03-15 NOTE — Progress Notes (Signed)
Please call patient and schedule her for a in-person or virtual visit with me in 4-6 weeks to follow-up regarding tolerance of Ozempic. I have sent in the prescription to her pharmacy. She should start by injecting 0.'25mg'$  into her subcutaneous tissue once a week x 4 weeks then increase to 0.'5mg'$ /week. If she is having any severe nausea, vomiting, stomach pain, or notes any masses to her neck she should stop the medication. Med sent to [facility] on [address].

## 2022-03-15 NOTE — Progress Notes (Signed)
Made pt aware of ozempic being send to her pharmacy, and type of reactions she should be aware and to stop using is any occur. Pt is schedule for a 6 weeks follow up on it virtually

## 2022-03-19 ENCOUNTER — Ambulatory Visit: Payer: Federal, State, Local not specified - PPO | Admitting: Adult Health

## 2022-03-20 ENCOUNTER — Telehealth (INDEPENDENT_AMBULATORY_CARE_PROVIDER_SITE_OTHER): Payer: Federal, State, Local not specified - PPO | Admitting: Adult Health

## 2022-03-20 ENCOUNTER — Telehealth: Payer: Self-pay | Admitting: Adult Health

## 2022-03-20 DIAGNOSIS — G4733 Obstructive sleep apnea (adult) (pediatric): Secondary | ICD-10-CM | POA: Diagnosis not present

## 2022-03-20 DIAGNOSIS — F17210 Nicotine dependence, cigarettes, uncomplicated: Secondary | ICD-10-CM | POA: Diagnosis not present

## 2022-03-20 NOTE — Patient Instructions (Signed)
Continue on CPAP At bedtime  .  ?Keep up the good work ?Work on healthy weight  ?Do not drive if sleepy  ?Work on quitting smoking  ?Follow up with Dr. Alva  or Keaten Mashek NP  In 1 year and As needed   ? ?

## 2022-03-20 NOTE — Telephone Encounter (Signed)
Spoke with Tammy who approved OV being changed to My Chart visit. C-Pap compliance printed and given to Tammy's nurse. Pt made aware OV was changed to My Chart visit. Nothing further needed at this time.

## 2022-03-20 NOTE — Progress Notes (Signed)
Virtual Visit via Video Note  I connected with [name] on 03/20/22 at  9:30 AM EDT by a video enabled telemedicine application and verified that I am speaking with the correct person using two identifiers.  Location: Patient: Home  Provider: Office    I discussed the limitations of evaluation and management by telemedicine and the availability of in person appointments. The patient expressed understanding and agreed to proceed.  History of Present Illness: 42 year old female smoker followed for obstructive sleep apnea on nocturnal CPAP Started on CPAP in 2018  Today's virtual visit is a 1 year follow-up for sleep apnea.  Patient remains on CPAP at bedtime.  Patient says she wears her CPAP every single night cannot sleep without it.  She feels that she benefits from CPAP with decreased daytime sleepiness.  CPAP download shows excellent compliance with 100% usage.  Daily average usage at 10 hours.  Patient is on auto CPAP 7 to 12 cm H2O.  AHI 1.1/hour.  Minimal leaks.  Uses [employer] [facility]. Uses nasal pillows.  She continues to smoke, we discussed smoking cessation.  She and her family are getting over stomach virus. We discussed follow up with PCP if not improving or unable to eat or drink .    Past Medical History:  Diagnosis Date   Anxiety    Arthritis    Back pain    Bilateral swelling of feet    Chest pain    Cluster headaches    Depression    Eczema    GERD (gastroesophageal reflux disease)    Gestational diabetes 2012, 2016   Hyperlipidemia    Hypertension    no meds currently   Joint pain    Obesity    Osteoarthritis    PCOS (polycystic ovarian syndrome)    Pre-diabetes    Sciatica    Shortness of breath    Sleep apnea    CPAP   Snoring    sleep study 02/2012 with min AHI events   Vitamin D deficiency     Current Outpatient Medications on File Prior to Visit  Medication Sig Dispense Refill   atorvastatin (LIPITOR) 10 MG tablet Take 1 tablet (10 mg total) by  mouth at bedtime. 90 tablet 1   Biotin 5000 MCG CAPS Take 1 capsule by mouth.     busPIRone (BUSPAR) 10 MG tablet Take 1 tablet (10 mg total) by mouth 3 (three) times daily. For mood control 270 tablet 1   celecoxib (CELEBREX) 100 MG capsule Take 1 capsule (100 mg total) by mouth 2 (two) times daily. 180 capsule 3   cetirizine (ZYRTEC) 10 MG tablet Take 1 tablet (10 mg total) by mouth daily. 30 tablet 11   Cholecalciferol (VITAMIN D3) 50 MCG (2000 UT) TABS TAKE 1 TABLET BY MOUTH EVERY DAY 100 tablet PRN   FLUoxetine (PROZAC) 20 MG capsule TAKE 3 CAPSULES BY MOUTH DAILY FOR MOOD CONTROL 270 capsule 1   guaiFENesin (MUCINEX) 600 MG 12 hr tablet Take 2 tablets (1,200 mg total) by mouth 2 (two) times daily as needed. 60 tablet 5   JUNEL 1/20 1-20 MG-MCG tablet Take 1 tablet by mouth daily.     meclizine (ANTIVERT) 25 MG tablet Take 1 tablet (25 mg total) by mouth 3 (three) times daily as needed for dizziness. 30 tablet 1   metFORMIN (GLUCOPHAGE) 500 MG tablet TAKE 1/2 TABLET BY DAILY WITH FIRST MEAL FOR 2 WEEKS.THEN TAKE 1 TABLET DAILY WITH FIRST MEAL 90 tablet 1  omeprazole (PRILOSEC) 40 MG capsule Take 1 capsule (40 mg total) by mouth daily. Take 30 minutes before breakfast. 30 capsule 2   Semaglutide,0.25 or 0.'5MG'$ /DOS, (OZEMPIC, 0.25 OR 0.5 MG/DOSE,) 2 MG/3ML SOPN Inject 0.'25mg'$  into the subcutaneous tissue once a week x 4 weeks, then inject 0.'5mg'$  into the subcutaneous tissue once a week. 3 mL 1   No current facility-administered medications on file prior to visit.      Observations/Objective: PSG  02/2012 - 263 pounds. Total sleep time was 350 minutes, AHI was 1.2/hour with few RERAs. 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   PSG 02/2016 280 lbs  AHI 12/h  Assessment and Plan: Obstructive sleep apnea with excellent control and compliance on nocturnal CPAP.- no changes  Morbid obesity - weight loss  Tobacco abuse - smoking cessation .  GI Virus- follow up with PCP if not  improving   Plan  Patient Instructions  Continue on CPAP At bedtime  .  Keep up the good work Work on [employer]  Do not drive if sleepy  Work on quitting smoking  Follow up with Dr. Elsworth Soho  or Macdonald Rigor NP  In 1 year and As needed       Follow Up Instructions:    I discussed the assessment and treatment plan with the patient. The patient was provided an opportunity to ask questions and all were answered. The patient agreed with the plan and demonstrated an understanding of the instructions.   The patient was advised to call back or seek an in-person evaluation if the symptoms worsen or if the condition fails to improve as anticipated.  I provided 20  minutes of non-face-to-face time during this encounter.   Rexene Edison, NP

## 2022-03-26 ENCOUNTER — Other Ambulatory Visit: Payer: Self-pay | Admitting: Internal Medicine

## 2022-04-03 ENCOUNTER — Ambulatory Visit: Payer: Federal, State, Local not specified - PPO | Admitting: Physician Assistant

## 2022-04-18 ENCOUNTER — Ambulatory Visit: Payer: Federal, State, Local not specified - PPO | Admitting: Nurse Practitioner

## 2022-04-20 DIAGNOSIS — E119 Type 2 diabetes mellitus without complications: Secondary | ICD-10-CM | POA: Diagnosis not present

## 2022-04-20 LAB — HM DIABETES EYE EXAM

## 2022-04-26 ENCOUNTER — Encounter: Payer: Federal, State, Local not specified - PPO | Admitting: Nurse Practitioner

## 2022-04-26 NOTE — Progress Notes (Signed)
Patient requested to r/s appt.

## 2022-04-27 ENCOUNTER — Encounter: Payer: Self-pay | Admitting: Nurse Practitioner

## 2022-04-27 ENCOUNTER — Telehealth (INDEPENDENT_AMBULATORY_CARE_PROVIDER_SITE_OTHER): Payer: Federal, State, Local not specified - PPO | Admitting: Nurse Practitioner

## 2022-04-27 DIAGNOSIS — E119 Type 2 diabetes mellitus without complications: Secondary | ICD-10-CM

## 2022-04-27 MED ORDER — SEMAGLUTIDE (1 MG/DOSE) 4 MG/3ML ~~LOC~~ SOPN
1.0000 mg | PEN_INJECTOR | SUBCUTANEOUS | 3 refills | Status: DC
Start: 2022-04-27 — End: 2022-08-20

## 2022-04-27 NOTE — Assessment & Plan Note (Signed)
Diagnosed 2 months ago A1c at goal Patient on atorvastatin 10 mg daily, last LDL above 70.  Will need to discuss at subsequent visit possibly increasing dose of atorvastatin to get LDL at 70 or below Continue metformin, per shared decision making will increase Ozempic to 1 mg/week Patient to return as scheduled at the end of May at which point she will be due for labs and rechecking A1c

## 2022-04-27 NOTE — Progress Notes (Signed)
   Established Patient Office Visit  An audio/visual tele-health visit was completed today for this patient. I connected with  [name] on 04/27/22 utilizing audio/visual technology and verified that I am speaking with the correct person using two identifiers. The patient was located at their home, and I was located at the office of [facility] at [facility] during the encounter. I discussed the limitations of evaluation and management by telemedicine. The patient expressed understanding and agreed to proceed.     Subjective   Patient ID: [name], female    DOB: [birth year]  Age: 42 y.o. MRN: [id number]  Chief Complaint  Patient presents with   Medical Management of Chronic Issues    Follow up ozempic, no complications, just little of stomach issue but feels is she keep up with hydration it get better      T2DM: newly diagnosed 02/2022. On statin therapy, last LDL 103.  Not currently on ACE or ARB.Last A1c 6.6.  On metformin 500 mg daily and Ozempic 0.5 mg weekly.  She is here to follow-up regarding how she is tolerating the Ozempic.  Reports today that she is tolerating it well, at times has upset stomach but no severe side effects.  Has not been monitoring weight closely so she is not sure if she has had any weight loss as of yet.    ROS: see HPI    Objective:     There were no vitals taken for this visit. BP Readings from Last 3 Encounters:  03/08/22 112/80  02/28/22 120/84  12/07/21 126/84   Wt Readings from Last 3 Encounters:  03/08/22 (!) 306 lb 6 oz (139 kg)  02/28/22 (!) 306 lb 8 oz (139 kg)  12/07/21 (!) 310 lb (140.6 kg)      Physical Exam Comprehensive physical exam not completed today as office visit was conducted remotely.  Patient appears well over video.  Patient was alert and oriented, and appeared to have appropriate judgment.   No results found for any visits on 04/27/22.    The 10-year ASCVD risk score (Arnett DK, et al.,  2019) is: 4%    Assessment & Plan:   Problem List Items Addressed This Visit       Endocrine   Diabetes mellitus without complication    Diagnosed 2 months ago A1c at goal Patient on atorvastatin 10 mg daily, last LDL above 70.  Will need to discuss at subsequent visit possibly increasing dose of atorvastatin to get LDL at 70 or below Continue metformin, per shared decision making will increase Ozempic to 1 mg/week Patient to return as scheduled at the end of May at which point she will be due for labs and rechecking A1c      Relevant Medications   Semaglutide, 1 MG/DOSE, 4 MG/3ML SOPN    Return for as sched.    Elenore Paddy, NP

## 2022-05-14 ENCOUNTER — Encounter (HOSPITAL_COMMUNITY): Payer: Self-pay

## 2022-05-14 DIAGNOSIS — G4733 Obstructive sleep apnea (adult) (pediatric): Secondary | ICD-10-CM | POA: Diagnosis not present

## 2022-05-24 ENCOUNTER — Other Ambulatory Visit: Payer: Self-pay | Admitting: Nurse Practitioner

## 2022-05-24 DIAGNOSIS — E785 Hyperlipidemia, unspecified: Secondary | ICD-10-CM

## 2022-05-24 MED ORDER — ATORVASTATIN CALCIUM 10 MG PO TABS
10.0000 mg | ORAL_TABLET | Freq: Every day | ORAL | 3 refills | Status: DC
Start: 2022-05-24 — End: 2023-03-20

## 2022-05-30 ENCOUNTER — Other Ambulatory Visit (INDEPENDENT_AMBULATORY_CARE_PROVIDER_SITE_OTHER): Payer: Federal, State, Local not specified - PPO

## 2022-05-30 DIAGNOSIS — E119 Type 2 diabetes mellitus without complications: Secondary | ICD-10-CM | POA: Diagnosis not present

## 2022-05-30 DIAGNOSIS — Z6841 Body Mass Index (BMI) 40.0 and over, adult: Secondary | ICD-10-CM | POA: Diagnosis not present

## 2022-05-30 DIAGNOSIS — E785 Hyperlipidemia, unspecified: Secondary | ICD-10-CM | POA: Diagnosis not present

## 2022-05-30 LAB — COMPREHENSIVE METABOLIC PANEL
ALT: 36 U/L — ABNORMAL HIGH (ref 0–35)
AST: 17 U/L (ref 0–37)
Albumin: 4 g/dL (ref 3.5–5.2)
Alkaline Phosphatase: 74 U/L (ref 39–117)
BUN: 12 mg/dL (ref 6–23)
CO2: 27 mEq/L (ref 19–32)
Calcium: 9.5 mg/dL (ref 8.4–10.5)
Chloride: 103 mEq/L (ref 96–112)
Creatinine, Ser: 0.9 mg/dL (ref 0.40–1.20)
GFR: 78.74 mL/min (ref >60.00)
Glucose, Bld: 96 mg/dL (ref 70–99)
Potassium: 4 mEq/L (ref 3.5–5.1)
Sodium: 137 mEq/L (ref 135–145)
Total Bilirubin: 0.4 mg/dL (ref 0.2–1.2)
Total Protein: 7 g/dL (ref 6.0–8.3)

## 2022-05-30 LAB — MICROALBUMIN / CREATININE URINE RATIO
Creatinine,U: 585.4 mg/dL
Microalb Creat Ratio: 0.4 mg/g (ref 0.0–30.0)
Microalb, Ur: 2.4 mg/dL — ABNORMAL HIGH (ref 0.0–1.9)

## 2022-05-30 LAB — LIPID PANEL
Cholesterol: 146 mg/dL (ref 0–200)
HDL: 37.6 mg/dL — ABNORMAL LOW (ref >39.00)
LDL Cholesterol: 85 mg/dL (ref 0–99)
NonHDL: 108.84
Total CHOL/HDL Ratio: 4
Triglycerides: 117 mg/dL (ref 0.0–149.0)
VLDL: 23.4 mg/dL (ref 0.0–40.0)

## 2022-05-30 LAB — CBC
HCT: 40.9 % (ref 36.0–46.0)
Hemoglobin: 13.4 g/dL (ref 12.0–15.0)
MCHC: 32.7 g/dL (ref 30.0–36.0)
MCV: 86.5 fl (ref 78.0–100.0)
Platelets: 287 10*3/uL (ref 150.0–400.0)
RBC: 4.72 Mil/uL (ref 3.87–5.11)
RDW: 14 % (ref 11.5–15.5)
WBC: 7.5 10*3/uL (ref 4.0–10.5)

## 2022-05-30 LAB — HEMOGLOBIN A1C: Hgb A1c MFr Bld: 6 % (ref 4.6–6.5)

## 2022-05-30 LAB — TSH: TSH: 2.35 u[IU]/mL (ref 0.35–5.50)

## 2022-05-31 ENCOUNTER — Telehealth (HOSPITAL_COMMUNITY): Payer: Self-pay | Admitting: Psychiatry

## 2022-05-31 ENCOUNTER — Telehealth (HOSPITAL_COMMUNITY): Payer: Federal, State, Local not specified - PPO | Admitting: Psychiatry

## 2022-05-31 NOTE — Telephone Encounter (Signed)
Patient was not present on video platform used through mychart.   I was not able to speak with the patient today, as they were a no show for their scheduled appointment.      

## 2022-06-06 ENCOUNTER — Other Ambulatory Visit: Payer: Self-pay | Admitting: Nurse Practitioner

## 2022-06-07 ENCOUNTER — Telehealth (HOSPITAL_BASED_OUTPATIENT_CLINIC_OR_DEPARTMENT_OTHER): Payer: Federal, State, Local not specified - PPO | Admitting: Psychiatry

## 2022-06-07 ENCOUNTER — Ambulatory Visit: Payer: Federal, State, Local not specified - PPO | Admitting: Nurse Practitioner

## 2022-06-07 VITALS — BP 118/84 | HR 81 | Temp 97.8°F | Ht 62.75 in | Wt 289.2 lb

## 2022-06-07 DIAGNOSIS — F332 Major depressive disorder, recurrent severe without psychotic features: Secondary | ICD-10-CM

## 2022-06-07 DIAGNOSIS — G4700 Insomnia, unspecified: Secondary | ICD-10-CM | POA: Diagnosis not present

## 2022-06-07 DIAGNOSIS — Z6841 Body Mass Index (BMI) 40.0 and over, adult: Secondary | ICD-10-CM

## 2022-06-07 DIAGNOSIS — E119 Type 2 diabetes mellitus without complications: Secondary | ICD-10-CM | POA: Diagnosis not present

## 2022-06-07 DIAGNOSIS — F411 Generalized anxiety disorder: Secondary | ICD-10-CM

## 2022-06-07 DIAGNOSIS — E785 Hyperlipidemia, unspecified: Secondary | ICD-10-CM | POA: Diagnosis not present

## 2022-06-07 MED ORDER — BUSPIRONE HCL 10 MG PO TABS
10.0000 mg | ORAL_TABLET | Freq: Three times a day (TID) | ORAL | 1 refills | Status: DC
Start: 2022-06-07 — End: 2023-11-28

## 2022-06-07 MED ORDER — FLUOXETINE HCL 20 MG PO CAPS
ORAL_CAPSULE | ORAL | 1 refills | Status: DC
Start: 2022-06-07 — End: 2023-05-03

## 2022-06-07 NOTE — Assessment & Plan Note (Signed)
Chronic A1c at goal of 6.0 Continue metformin 5 mg daily and Ozempic 1 mg weekly.

## 2022-06-07 NOTE — Assessment & Plan Note (Signed)
Chronic We did discuss that goal LDL would be less than 70 and she is a bit above this on her current atorvastatin 10 mg daily. Patient would prefer to continue on atorvastatin 10 mg daily and recheck fasting lipid panel in 3 months before increasing dose of atorvastatin. I am agreeable to this, follow-up in 3 months for office visit and fasting labs.

## 2022-06-07 NOTE — Progress Notes (Signed)
Virtual Visit via Video Note  I connected with [name] on 06/07/22 at  2:00 PM EDT by a video enabled telemedicine application and verified that I am speaking with the correct person using two identifiers.  Location: Patient: home Provider: office   I discussed the limitations of evaluation and management by telemedicine and the availability of in person appointments. The patient expressed understanding and agreed to proceed.  History of Present Illness: "I'm pretty good". [name] is a little stressed because she has to go to [place] next week for work. She is feeling overwhelmed because she has not had much time to prepare. She is going to be gone for 2 weeks. She has been taking Ozempic and she has lost 17 lbs in the last 3 months. She is very happy. Other than work she doesn't have much anxiety. Lately her sleep has been more broken than usual. She is not sure why but thinks it could be due to her MIL watching tv late at night. [name] is having trouble falling back asleep. She always takes her morning Buspar but often forgets the other 2 doses. She denies depression, isolation, anhedonia and hopelessness. She denies SI/HI.     Observations/Objective: Psychiatric Specialty Exam: ROS  unknown if currently breastfeeding.There is no height or weight on file to calculate BMI.  General Appearance: Fairly Groomed and Neat  Eye Contact:  Good  Speech:  Clear and Coherent and Normal Rate  Volume:  Normal  Mood:  Anxious  Affect:  Full Range  Thought Process:  Goal Directed, Linear, and Descriptions of Associations: Intact  Orientation:  Full (Time, Place, and Person)  Thought Content:  Logical  Suicidal Thoughts:  No  Homicidal Thoughts:  No  Memory:  Immediate;   Good  Judgement:  Good  Insight:  Good  Psychomotor Activity:  Normal  Concentration:  Concentration: Good  Recall:  Good  Fund of Knowledge:  Good  Language:  Good  Akathisia:  No  Handed:  Right  AIMS (if  indicated):     Assets:  Communication Skills Desire for Improvement Financial Resources/Insurance Housing Intimacy Leisure Time Resilience Social Support Talents/Skills Transportation Vocational/Educational  ADL's:  Intact  Cognition:  WNL  Sleep:        Assessment and Plan:     06/07/2022    2:22 PM 06/07/2022    8:11 AM 06/07/2022    8:06 AM 04/27/2022    9:35 AM 03/08/2022    8:06 AM  Depression screen PHQ 2/9  Decreased Interest 0 0 0 0 0  Down, Depressed, Hopeless 0 0 0 0 0  PHQ - 2 Score 0 0 0 0 0  Altered sleeping 3 0   0  Tired, decreased energy 0 0   0  Change in appetite 0 0   0  Feeling bad or failure about yourself  0 0   0  Trouble concentrating 0 0   0  Moving slowly or fidgety/restless 0 0   0  Suicidal thoughts 0 0   0  PHQ-9 Score 3 0   0  Difficult doing work/chores  Not difficult at all Not difficult at all Not difficult at all Not difficult at all    Flowsheet Row Video Visit from 06/07/2022 in [facility] Video Visit from 02/22/2022 in [facility] Video Visit from 08/31/2021 in [facility]  C-SSRS RISK CATEGORY No Risk No Risk No Risk  Pt is aware that these meds carry a teratogenic risk. Pt will discuss plan of action if she does or plans to become pregnant in the future.  Status of current problems: worsening sleep   Medication management with supportive therapy. Risks and benefits, side effects and alternative treatment options discussed with patient. Pt was given an opportunity to ask questions about medication, illness, and treatment. All current psychiatric medications have been reviewed and discussed with the patient and adjusted as clinically appropriate.  Pt verbalized understanding and verbal consent obtained for treatment.  Meds: encouraged to restart PM dose of Buspar for sleep 1. Insomnia, unspecified type -  busPIRone (BUSPAR) 10 MG tablet; Take 1 tablet (10 mg total) by mouth 3 (three) times daily. For mood control  Dispense: 270 tablet; Refill: 1  2. GAD (generalized anxiety disorder) - busPIRone (BUSPAR) 10 MG tablet; Take 1 tablet (10 mg total) by mouth 3 (three) times daily. For mood control  Dispense: 270 tablet; Refill: 1 - FLUoxetine (PROZAC) 20 MG capsule; TAKE 3 CAPSULES BY MOUTH DAILY FOR MOOD CONTROL  Dispense: 270 capsule; Refill: 1  3. Severe episode of recurrent major depressive disorder, without psychotic features (HCC) - FLUoxetine (PROZAC) 20 MG capsule; TAKE 3 CAPSULES BY MOUTH DAILY FOR MOOD CONTROL  Dispense: 270 capsule; Refill: 1     Labs: none today    Therapy: brief supportive therapy provided. Discussed psychosocial stressors in detail.  Collaboration of Care: Other none today  Patient/Guardian was advised Release of Information must be obtained prior to any record release in order to collaborate their care with an outside provider. Patient/Guardian was advised if they have not already done so to contact the registration department to sign all necessary forms in order for [place] to release information regarding their care.   Consent: Patient/Guardian gives verbal consent for treatment and assignment of benefits for services provided during this visit. Patient/Guardian expressed understanding and agreed to proceed.     Follow Up Instructions: Follow up in 6 months or sooner if needed with her PCP for management of depression, anxiety and insomnia.  Patient informed that I am leaving [facility] in 07/2022 and I relayed that they will be getting a new provider after that if she chooses to stay with [facility]. Patient verbalized understanding and agreed with the plan.     I discussed the assessment and treatment plan with the patient. The patient was provided an opportunity to ask questions and all were answered. The patient agreed with the plan and demonstrated an understanding  of the instructions.   The patient was advised to call back or seek an in-person evaluation if the symptoms worsen or if the condition fails to improve as anticipated.  I provided 14 minutes of non-face-to-face time during this encounter.   Oletta Darter, MD

## 2022-06-07 NOTE — Patient Instructions (Signed)
Famotidine / pepcid

## 2022-06-07 NOTE — Assessment & Plan Note (Signed)
Chronic Patient to continue focusing on lifestyle modification Patient also to continue taking Ozempic for treatment of type 2 diabetes, however this is also helping her with weight loss.

## 2022-06-07 NOTE — Progress Notes (Signed)
Established Patient Office Visit  Subjective   Patient ID: [name], female    DOB: [birth year]  Age: 42 y.o. MRN: [id number]  Chief Complaint  Patient presents with   Diabetes    Type 2 diabetes/hyperlipidemia/obesity: Newly diagnosed 3 months ago.  A1c at that time 6.6.  Patient is on metformin 500 mg daily.  She is also started Ozempic and she is currently on 1 mg/week.  Tolerating medications well.  Does have some nausea and heartburn intermittently.  Was prescribed omeprazole but this gave her upset stomach so she stopped taking this medication, plans to follow-up with GI.  She continues on atorvastatin 10 mg/day, last LDL 85.  She is not on ACE or ARB, no albuminuria noted in urine on last labs.  She has lost about 17 pounds since starting Ozempic.  Most recent A1c has dropped to 6.0 since starting Ozempic as well.    Review of Systems  Respiratory:  Negative for cough, shortness of breath and wheezing.   Cardiovascular:  Negative for chest pain and palpitations.  Gastrointestinal:  Positive for heartburn, nausea and vomiting. Negative for abdominal pain, blood in stool and constipation.      Objective:     BP 118/84   Pulse 81   Temp 97.8 F (36.6 C) (Temporal)   Ht 5' 2.75" (1.594 m)   Wt 289 lb 4 oz (131.2 kg)   SpO2 98%   BMI 51.65 kg/m  BP Readings from Last 3 Encounters:  06/07/22 118/84  03/08/22 112/80  02/28/22 120/84   Wt Readings from Last 3 Encounters:  06/07/22 289 lb 4 oz (131.2 kg)  03/08/22 (!) 306 lb 6 oz (139 kg)  02/28/22 (!) 306 lb 8 oz (139 kg)      Physical Exam Vitals reviewed.  Constitutional:      General: She is not in acute distress.    Appearance: Normal appearance.  HENT:     Head: Normocephalic and atraumatic.  Neck:     Vascular: No carotid bruit.  Cardiovascular:     Rate and Rhythm: Normal rate and regular rhythm.     Pulses: Normal pulses.     Heart sounds: Normal heart sounds.  Pulmonary:     Effort:  Pulmonary effort is normal.     Breath sounds: Normal breath sounds.  Skin:    General: Skin is warm and dry.  Neurological:     General: No focal deficit present.     Mental Status: She is alert and oriented to person, place, and time.  Psychiatric:        Mood and Affect: Mood normal.        Behavior: Behavior normal.        Judgment: Judgment normal.      No results found for any visits on 06/07/22.    The 10-year ASCVD risk score (Arnett DK, et al., 2019) is: 4.5%    Assessment & Plan:   Problem List Items Addressed This Visit       Endocrine   Diabetes mellitus without complication (HCC) - Primary    Chronic A1c at goal of 6.0 Continue metformin 5 mg daily and Ozempic 1 mg weekly.      Relevant Orders   Comprehensive metabolic panel   Hemoglobin A1c   Lipid panel   HgB A1c   Comprehensive metabolic panel   Lipid panel     Other   Class 3 severe obesity with serious comorbidity and body mass index (  BMI) of 50.0 to 59.9 in adult [facility])    Chronic Patient to continue focusing on lifestyle modification Patient also to continue taking Ozempic for treatment of type 2 diabetes, however this is also helping her with weight loss.      Relevant Orders   Comprehensive metabolic panel   Hemoglobin A1c   Lipid panel   HgB A1c   Comprehensive metabolic panel   Lipid panel   Hyperlipidemia    Chronic We did discuss that goal LDL would be less than 70 and she is a bit above this on her current atorvastatin 10 mg daily. Patient would prefer to continue on atorvastatin 10 mg daily and recheck fasting lipid panel in 3 months before increasing dose of atorvastatin. I am agreeable to this, follow-up in 3 months for office visit and fasting labs.      Relevant Orders   Comprehensive metabolic panel   Hemoglobin A1c   Lipid panel   HgB A1c   Comprehensive metabolic panel   Lipid panel    Return in about 3 months (around 09/07/2022) for F/U with Giankarlo Leamer.    Elenore Paddy, NP

## 2022-06-08 ENCOUNTER — Ambulatory Visit: Payer: Federal, State, Local not specified - PPO | Admitting: Nurse Practitioner

## 2022-06-25 ENCOUNTER — Other Ambulatory Visit: Payer: Self-pay | Admitting: Nurse Practitioner

## 2022-08-09 ENCOUNTER — Telehealth (HOSPITAL_COMMUNITY): Payer: Federal, State, Local not specified - PPO | Admitting: Psychiatry

## 2022-08-10 ENCOUNTER — Telehealth: Payer: Self-pay | Admitting: Nurse Practitioner

## 2022-08-10 NOTE — Telephone Encounter (Signed)
Patient called and said she was told to call the office if she had any other symptoms regarding Ozempic. She said she was feeling discomfort under her right breast, bloating, and some vomiting. She would like a call back at [phone].

## 2022-08-10 NOTE — Telephone Encounter (Signed)
Spoke to pt made her aware of provider not in regards to the symptoms she is experiencing with the Ozempic. Pt stated she is not having any severe  symptoms but mild one, and also  there seems to be this swelling under her right breast.

## 2022-08-10 NOTE — Telephone Encounter (Signed)
Spoke to pt made her aware of provider not in regards to the symptoms she is experiencing with the Ozempic. Pt stated she is not have severe  symptoms but mild on, and also that there seems to be this swelling under her right breast.

## 2022-08-13 ENCOUNTER — Other Ambulatory Visit: Payer: Self-pay | Admitting: Nurse Practitioner

## 2022-08-13 DIAGNOSIS — R7303 Prediabetes: Secondary | ICD-10-CM

## 2022-08-14 DIAGNOSIS — G4733 Obstructive sleep apnea (adult) (pediatric): Secondary | ICD-10-CM | POA: Diagnosis not present

## 2022-08-16 ENCOUNTER — Ambulatory Visit (INDEPENDENT_AMBULATORY_CARE_PROVIDER_SITE_OTHER): Payer: Federal, State, Local not specified - PPO

## 2022-08-16 ENCOUNTER — Ambulatory Visit: Payer: Federal, State, Local not specified - PPO | Admitting: Nurse Practitioner

## 2022-08-16 VITALS — BP 122/84 | HR 80 | Temp 98.3°F | Ht 62.75 in | Wt 278.0 lb

## 2022-08-16 DIAGNOSIS — R109 Unspecified abdominal pain: Secondary | ICD-10-CM | POA: Insufficient documentation

## 2022-08-16 DIAGNOSIS — K5900 Constipation, unspecified: Secondary | ICD-10-CM | POA: Diagnosis not present

## 2022-08-16 LAB — URINALYSIS, ROUTINE W REFLEX MICROSCOPIC
Bilirubin Urine: NEGATIVE
Hgb urine dipstick: NEGATIVE
Ketones, ur: 15 — AB
Leukocytes,Ua: NEGATIVE
Nitrite: NEGATIVE
RBC / HPF: NONE SEEN (ref 0–?)
Specific Gravity, Urine: 1.025 (ref 1.000–1.030)
Total Protein, Urine: NEGATIVE
Urine Glucose: NEGATIVE
Urobilinogen, UA: 1 (ref 0.0–1.0)
pH: 6.5 (ref 5.0–8.0)

## 2022-08-16 LAB — COMPREHENSIVE METABOLIC PANEL
ALT: 27 U/L (ref 0–35)
AST: 15 U/L (ref 0–37)
Albumin: 4.1 g/dL (ref 3.5–5.2)
Alkaline Phosphatase: 70 U/L (ref 39–117)
BUN: 10 mg/dL (ref 6–23)
CO2: 27 mEq/L (ref 19–32)
Calcium: 9.5 mg/dL (ref 8.4–10.5)
Chloride: 104 mEq/L (ref 96–112)
Creatinine, Ser: 0.89 mg/dL (ref 0.40–1.20)
GFR: 79.69 mL/min (ref >60.00)
Glucose, Bld: 98 mg/dL (ref 70–99)
Potassium: 4.3 mEq/L (ref 3.5–5.1)
Sodium: 137 mEq/L (ref 135–145)
Total Bilirubin: 0.4 mg/dL (ref 0.2–1.2)
Total Protein: 7 g/dL (ref 6.0–8.3)

## 2022-08-16 LAB — AMYLASE: Amylase: 39 U/L (ref 27–131)

## 2022-08-16 LAB — CBC
HCT: 41.2 % (ref 36.0–46.0)
Hemoglobin: 13.5 g/dL (ref 12.0–15.0)
MCHC: 32.7 g/dL (ref 30.0–36.0)
MCV: 88.1 fl (ref 78.0–100.0)
Platelets: 292 10*3/uL (ref 150.0–400.0)
RBC: 4.67 Mil/uL (ref 3.87–5.11)
RDW: 14 % (ref 11.5–15.5)
WBC: 7.8 10*3/uL (ref 4.0–10.5)

## 2022-08-16 LAB — LIPASE: Lipase: 8 U/L — ABNORMAL LOW (ref 11.0–59.0)

## 2022-08-16 NOTE — Assessment & Plan Note (Signed)
Acute, intermittent, severity ranges from mild to moderate.  I think most likely etiology is constipation which may have been worsened by Ozempic.  Today, will check labs including CMP, CBC, lipase, amylase, and urine to rule out pancreatitis, metabolic abnormalities, anemia, and UTI.  Will also check abdominal x-ray for any evidence of ileus or obstruction.  Pending results tentative plan will be MiraLAX daily x 10 days, then daily stool softener, and may be restart Ozempic at 0.5 mg/week.  Patient will also follow-up with gastroenterology.  Tentative plan may change pending review of labs and x-ray results.

## 2022-08-16 NOTE — Progress Notes (Signed)
Established Patient Office Visit  Subjective   Patient ID: [name], female    DOB: [birth year]  Age: 42 y.o. MRN: [id number]  Chief Complaint  Patient presents with   Medical Management of Chronic Issues    Follow up on Ozempic, bloating, constipation, stomach ache    Patient comes in today for the above.  Over the last couple of weeks she has been experiencing more abdominal bloating, constipation, "sour stomach", gassiness, and intermittent nausea with some vomiting.  About 3 months ago her Ozempic was increased to 1 mg/week, tolerated this well for a couple of months.  Has had these similar GI symptoms for a while and is still waiting to be seen by gastroenterology for further evaluation.  Reports currently having 2 bowel movements per week, last bowel movement 2 days ago.  Is able to pass gas.  Does feel some nausea after eating meals most of the time.  Is not experiencing severe pain but does have bloating which is uncomfortable.  Not taking anything to help with constipation.  Has not been on Ozempic for approximately 9 days has not noticed any improvement in symptoms.  Patient has history of tubal ligation.    ROS: see HPI    Objective:     BP 122/84   Pulse 80   Temp 98.3 F (36.8 C) (Temporal)   Ht 5' 2.75" (1.594 m)   Wt 278 lb (126.1 kg)   SpO2 99%   BMI 49.64 kg/m  BP Readings from Last 3 Encounters:  08/16/22 122/84  06/07/22 118/84  03/08/22 112/80   Wt Readings from Last 3 Encounters:  08/16/22 278 lb (126.1 kg)  06/07/22 289 lb 4 oz (131.2 kg)  03/08/22 (!) 306 lb 6 oz (139 kg)      Physical Exam Vitals reviewed.  Constitutional:      General: She is not in acute distress.    Appearance: Normal appearance.  HENT:     Head: Normocephalic and atraumatic.  Neck:     Thyroid: No thyroid mass or thyromegaly.     Vascular: No carotid bruit.  Cardiovascular:     Rate and Rhythm: Normal rate and regular rhythm.     Pulses: Normal pulses.      Heart sounds: Normal heart sounds.  Pulmonary:     Effort: Pulmonary effort is normal.     Breath sounds: Normal breath sounds.  Abdominal:     General: Abdomen is flat. Bowel sounds are decreased. There is no distension.     Palpations: Abdomen is soft. There is no hepatomegaly, splenomegaly or mass.     Tenderness: There is no abdominal tenderness. There is no guarding. Negative signs include Murphy's sign.  Skin:    General: Skin is warm and dry.  Neurological:     General: No focal deficit present.     Mental Status: She is alert and oriented to person, place, and time.  Psychiatric:        Mood and Affect: Mood normal.        Behavior: Behavior normal.        Judgment: Judgment normal.      No results found for any visits on 08/16/22.    The 10-year ASCVD risk score (Arnett DK, et al., 2019) is: 4.8%    Assessment & Plan:   Problem List Items Addressed This Visit       Other   Abdominal pain - Primary    Acute, intermittent, severity ranges from  mild to moderate.  I think most likely etiology is constipation which may have been worsened by Ozempic.  Today, will check labs including CMP, CBC, lipase, amylase, and urine to rule out pancreatitis, metabolic abnormalities, anemia, and UTI.  Will also check abdominal x-ray for any evidence of ileus or obstruction.  Pending results tentative plan will be MiraLAX daily x 10 days, then daily stool softener, and may be restart Ozempic at 0.5 mg/week.  Patient will also follow-up with gastroenterology.  Tentative plan may change pending review of labs and x-ray results.      Relevant Orders   Comprehensive metabolic panel   CBC   Amylase   Lipase   DG Abd 2 Views   Urinalysis, Routine w reflex microscopic   Urine Culture    Return as scheduled.    Elenore Paddy, NP

## 2022-08-17 ENCOUNTER — Encounter: Payer: Self-pay | Admitting: Nurse Practitioner

## 2022-08-20 ENCOUNTER — Telehealth: Payer: Self-pay | Admitting: Nurse Practitioner

## 2022-08-20 DIAGNOSIS — K5900 Constipation, unspecified: Secondary | ICD-10-CM

## 2022-08-20 DIAGNOSIS — E119 Type 2 diabetes mellitus without complications: Secondary | ICD-10-CM

## 2022-08-20 MED ORDER — DOCUSATE SODIUM 100 MG PO CAPS
100.0000 mg | ORAL_CAPSULE | Freq: Every day | ORAL | 3 refills | Status: DC
Start: 2022-08-20 — End: 2023-08-25

## 2022-08-20 MED ORDER — SEMAGLUTIDE(0.25 OR 0.5MG/DOS) 2 MG/3ML ~~LOC~~ SOPN
0.5000 mg | PEN_INJECTOR | SUBCUTANEOUS | 0 refills | Status: DC
Start: 2022-08-20 — End: 2022-09-13

## 2022-08-20 MED ORDER — POLYETHYLENE GLYCOL 3350 17 G PO PACK
17.0000 g | PACK | Freq: Every day | ORAL | 0 refills | Status: DC
Start: 2022-08-20 — End: 2023-03-11

## 2022-08-20 NOTE — Telephone Encounter (Signed)
Please call patient and let her know that after reviewing her labs and xray there was evidence of constipation noted. She should start miralax daily for 10 days then she should start a stool softener such as docusate sodium 100mg  and take 1 capsule by mouth every day. Both of these can be found over the counter. If she starts to have loose stools or diarrhea with the miralax then she can stop this early and just take the daily stool softener.   If she would like to she can also restart the Ozempic. I would recommend we start at 0.5mg /week and if she starts to have reoccurrence of abdominal symptoms she should reach out. Once she has completed 2 doses of the Ozempic at 0.5mg /week she should reach out to let me know if she is tolerating it well. If so, then we can increase back to the 1mg /week.   She should follow-up with me as scheduled and she should reach out with any questions.

## 2022-08-21 NOTE — Telephone Encounter (Signed)
Called pt with [name] response.[name] KitchenRaechel Chute

## 2022-08-23 ENCOUNTER — Encounter (INDEPENDENT_AMBULATORY_CARE_PROVIDER_SITE_OTHER): Payer: Self-pay

## 2022-08-24 ENCOUNTER — Other Ambulatory Visit: Payer: Self-pay | Admitting: Nurse Practitioner

## 2022-08-24 DIAGNOSIS — E785 Hyperlipidemia, unspecified: Secondary | ICD-10-CM

## 2022-08-24 DIAGNOSIS — E119 Type 2 diabetes mellitus without complications: Secondary | ICD-10-CM

## 2022-09-12 ENCOUNTER — Other Ambulatory Visit (INDEPENDENT_AMBULATORY_CARE_PROVIDER_SITE_OTHER): Payer: Federal, State, Local not specified - PPO

## 2022-09-12 DIAGNOSIS — E119 Type 2 diabetes mellitus without complications: Secondary | ICD-10-CM | POA: Diagnosis not present

## 2022-09-12 DIAGNOSIS — E785 Hyperlipidemia, unspecified: Secondary | ICD-10-CM | POA: Diagnosis not present

## 2022-09-12 LAB — LIPID PANEL
Cholesterol: 142 mg/dL (ref 0–200)
HDL: 29.5 mg/dL — ABNORMAL LOW (ref >39.00)
LDL Cholesterol: 84 mg/dL (ref 0–99)
NonHDL: 112.44
Total CHOL/HDL Ratio: 5
Triglycerides: 143 mg/dL (ref 0.0–149.0)
VLDL: 28.6 mg/dL (ref 0.0–40.0)

## 2022-09-12 LAB — COMPREHENSIVE METABOLIC PANEL
ALT: 51 U/L — ABNORMAL HIGH (ref 0–35)
AST: 21 U/L (ref 0–37)
Albumin: 3.9 g/dL (ref 3.5–5.2)
Alkaline Phosphatase: 77 U/L (ref 39–117)
BUN: 12 mg/dL (ref 6–23)
CO2: 25 meq/L (ref 19–32)
Calcium: 9.4 mg/dL (ref 8.4–10.5)
Chloride: 106 meq/L (ref 96–112)
Creatinine, Ser: 0.95 mg/dL (ref 0.40–1.20)
GFR: 73.65 mL/min (ref >60.00)
Glucose, Bld: 96 mg/dL (ref 70–99)
Potassium: 4.2 meq/L (ref 3.5–5.1)
Sodium: 139 meq/L (ref 135–145)
Total Bilirubin: 0.4 mg/dL (ref 0.2–1.2)
Total Protein: 7 g/dL (ref 6.0–8.3)

## 2022-09-12 LAB — HEMOGLOBIN A1C: Hgb A1c MFr Bld: 5.7 % (ref 4.6–6.5)

## 2022-09-13 ENCOUNTER — Ambulatory Visit: Payer: Federal, State, Local not specified - PPO | Attending: Nurse Practitioner

## 2022-09-13 ENCOUNTER — Ambulatory Visit: Payer: Federal, State, Local not specified - PPO | Admitting: Nurse Practitioner

## 2022-09-13 VITALS — BP 122/78 | HR 75 | Temp 98.2°F | Ht 62.75 in | Wt 270.4 lb

## 2022-09-13 DIAGNOSIS — E119 Type 2 diabetes mellitus without complications: Secondary | ICD-10-CM | POA: Diagnosis not present

## 2022-09-13 DIAGNOSIS — Z7984 Long term (current) use of oral hypoglycemic drugs: Secondary | ICD-10-CM | POA: Diagnosis not present

## 2022-09-13 DIAGNOSIS — R002 Palpitations: Secondary | ICD-10-CM | POA: Diagnosis not present

## 2022-09-13 DIAGNOSIS — Z7985 Long-term (current) use of injectable non-insulin antidiabetic drugs: Secondary | ICD-10-CM | POA: Diagnosis not present

## 2022-09-13 DIAGNOSIS — R748 Abnormal levels of other serum enzymes: Secondary | ICD-10-CM

## 2022-09-13 MED ORDER — SEMAGLUTIDE (1 MG/DOSE) 4 MG/3ML ~~LOC~~ SOPN: 1.0000 mg | PEN_INJECTOR | SUBCUTANEOUS | 2 refills | Status: DC

## 2022-09-13 NOTE — Progress Notes (Unsigned)
Enrolled for [employer] to mail a ZIO XT long term holter monitor to the patients address on file.   Dr. Mary Branch to read. 

## 2022-09-13 NOTE — Assessment & Plan Note (Signed)
Order abdominal ultrasound for further evaluation, further recommendations may be made based upon these results

## 2022-09-13 NOTE — Progress Notes (Signed)
Established Patient Office Visit  Subjective   Patient ID: [name], female    DOB: 05/17/1980  Age: 42 y.o. MRN: [id number]  Chief Complaint  Patient presents with   Palpitations    Palpitations: onset 10 days ago. Has been under a lot of stress. Has them everyday. No known trigger/pattern except she notices it at rest. Lasts for 30-60 seconds, resolves spontaneously. No increase in caffeine intake (2 cups in the morning this has been for a long time). No chest pain, SOB.  Diabetes: Last A1c 5.7, on Ozempic 0.5 mg weekly.  Was on 1 mg weekly however we reduced dose due to recent abdominal pain which has now resolved.  Also on atorvastatin and metformin. Elevated liver enzymes: She had recent abdominal pain which seems to have now resolved.  She does have mildly elevated liver enzyme with ALT of 51.  History of it being as high as 36 previously.  She also feels a swelling or enlarged area on the right side of her abdomen.  No apparent mass.   Review of Systems  Respiratory:  Negative for shortness of breath.   Cardiovascular:  Negative for chest pain, orthopnea and leg swelling.  Gastrointestinal:  Negative for abdominal pain.      Objective:     BP 122/78   Pulse 75   Temp 98.2 F (36.8 C) (Temporal)   Ht 5' 2.75" (1.594 m)   Wt 270 lb 6 oz (122.6 kg)   LMP 07/26/2022 (Exact Date) Comment: Had "tubes removed"  SpO2 97%   BMI 48.28 kg/m  BP Readings from Last 3 Encounters:  09/13/22 122/78  08/16/22 122/84  06/07/22 118/84   Wt Readings from Last 3 Encounters:  09/13/22 270 lb 6 oz (122.6 kg)  08/16/22 278 lb (126.1 kg)  06/07/22 289 lb 4 oz (131.2 kg)   EKG: NSR; PR , QRS 74 ms, QT    Physical Exam Vitals reviewed.  Constitutional:      General: She is not in acute distress.    Appearance: Normal appearance.  HENT:     Head: Normocephalic and atraumatic.  Neck:     Vascular: No carotid bruit.  Cardiovascular:     Rate and Rhythm: Normal  rate and regular rhythm.     Pulses: Normal pulses.     Heart sounds: Normal heart sounds.  Pulmonary:     Effort: Pulmonary effort is normal.     Breath sounds: Normal breath sounds.  Skin:    General: Skin is warm and dry.  Neurological:     General: No focal deficit present.     Mental Status: She is alert and oriented to person, place, and time.  Psychiatric:        Mood and Affect: Mood normal.        Behavior: Behavior normal.        Judgment: Judgment normal.      No results found for any visits on 09/13/22.    The 10-year ASCVD risk score (Arnett DK, et al., 2019) is: 6.9%    Assessment & Plan:   Problem List Items Addressed This Visit       Endocrine   Diabetes mellitus without complication (HCC)    Chronic A1c at goal.  Current A1c 5.7. Continue metformin 500 mg daily, increase Ozempic back to 1 mg/week. Continue statin therapy.      Relevant Medications   Semaglutide, 1 MG/DOSE, 4 MG/3ML SOPN     Other  Palpitations - Primary    Intermittent, acute Get long-term cardiac monitor for 3 days Further recommendations may be made based upon the results      Relevant Orders   EKG 12-Lead   LONG TERM MONITOR (3-14 DAYS)   Elevated liver enzymes    Order abdominal ultrasound for further evaluation, further recommendations may be made based upon these results      Relevant Orders   US Abdomen Limited RUQ (LIVER/GB)    Return in about 3 months (around 12/13/2022) for F/U with Shanya Ferriss.    Elenore Paddy, NP

## 2022-09-13 NOTE — Assessment & Plan Note (Signed)
Chronic A1c at goal.  Current A1c 5.7. Continue metformin 500 mg daily, increase Ozempic back to 1 mg/week. Continue statin therapy.

## 2022-09-13 NOTE — Assessment & Plan Note (Signed)
Intermittent, acute Get long-term cardiac monitor for 3 days Further recommendations may be made based upon the results

## 2022-09-18 ENCOUNTER — Other Ambulatory Visit: Payer: Self-pay | Admitting: Nurse Practitioner

## 2022-09-18 ENCOUNTER — Ambulatory Visit
Admission: RE | Admit: 2022-09-18 | Discharge: 2022-09-18 | Disposition: A | Discharge status: Home / Self Care | Payer: Federal, State, Local not specified - PPO | Source: Ambulatory Visit | Attending: Nurse Practitioner | Admitting: Nurse Practitioner

## 2022-09-18 DIAGNOSIS — E119 Type 2 diabetes mellitus without complications: Secondary | ICD-10-CM

## 2022-09-18 DIAGNOSIS — R002 Palpitations: Secondary | ICD-10-CM

## 2022-09-18 DIAGNOSIS — R7989 Other specified abnormal findings of blood chemistry: Secondary | ICD-10-CM | POA: Diagnosis not present

## 2022-09-18 DIAGNOSIS — R748 Abnormal levels of other serum enzymes: Secondary | ICD-10-CM

## 2022-10-02 ENCOUNTER — Ambulatory Visit: Payer: Federal, State, Local not specified - PPO | Admitting: Nurse Practitioner

## 2022-10-05 ENCOUNTER — Encounter: Payer: Self-pay | Admitting: Nurse Practitioner

## 2022-10-05 ENCOUNTER — Other Ambulatory Visit: Payer: Self-pay | Admitting: Nurse Practitioner

## 2022-10-05 DIAGNOSIS — R002 Palpitations: Secondary | ICD-10-CM

## 2022-10-17 ENCOUNTER — Encounter: Payer: Self-pay | Admitting: Nurse Practitioner

## 2022-10-24 ENCOUNTER — Ambulatory Visit (HOSPITAL_COMMUNITY): Payer: Federal, State, Local not specified - PPO | Attending: Nurse Practitioner

## 2022-10-24 ENCOUNTER — Encounter: Payer: Self-pay | Admitting: Internal Medicine

## 2022-10-24 DIAGNOSIS — R002 Palpitations: Secondary | ICD-10-CM | POA: Insufficient documentation

## 2022-10-24 DIAGNOSIS — I10 Essential (primary) hypertension: Secondary | ICD-10-CM

## 2022-10-24 LAB — ECHOCARDIOGRAM COMPLETE
Area-P 1/2: 3.48 cm2
S' Lateral: 3.3 cm

## 2022-10-24 NOTE — Progress Notes (Deleted)
    Subjective:    Patient ID: Susa Griffins, female    DOB: [birth year], 42 y.o.   MRN: [id number]      HPI Reida is here for No chief complaint on file.    Numbness in pinky and ring finger -     Medications and allergies reviewed with patient and updated if appropriate.  Current Outpatient Medications on File Prior to Visit  Medication Sig Dispense Refill   atorvastatin (LIPITOR) 10 MG tablet Take 1 tablet (10 mg total) by mouth at bedtime. 90 tablet 3   Biotin 5000 MCG CAPS Take 1 capsule by mouth.     busPIRone (BUSPAR) 10 MG tablet Take 1 tablet (10 mg total) by mouth 3 (three) times daily. For mood control 270 tablet 1   celecoxib (CELEBREX) 100 MG capsule Take 1 capsule (100 mg total) by mouth 2 (two) times daily. 180 capsule 3   cetirizine (ZYRTEC) 10 MG tablet TAKE 1 TABLET BY MOUTH EVERY DAY 90 tablet 0   Cholecalciferol (VITAMIN D3) 50 MCG (2000 UT) TABS TAKE 1 TABLET BY MOUTH EVERY DAY 100 tablet PRN   docusate sodium (COLACE) 100 MG capsule Take 1 capsule (100 mg total) by mouth daily. 90 capsule 3   FLUoxetine (PROZAC) 20 MG capsule TAKE 3 CAPSULES BY MOUTH DAILY FOR MOOD CONTROL 270 capsule 1   guaiFENesin (MUCINEX) 600 MG 12 hr tablet Take 2 tablets (1,200 mg total) by mouth 2 (two) times daily as needed. 60 tablet 5   JUNEL 1/20 1-20 MG-MCG tablet Take 1 tablet by mouth daily.     meclizine (ANTIVERT) 25 MG tablet Take 1 tablet (25 mg total) by mouth 3 (three) times daily as needed for dizziness. 30 tablet 1   metFORMIN (GLUCOPHAGE) 500 MG tablet TAKE 1/2 TABLET BY MOUTH DAILY WITH FIRST MEAL FOR 2 WEEKS THEN 1 TABLET DAILY WITH FIRST MEAL 90 tablet 1   polyethylene glycol (MIRALAX / GLYCOLAX) 17 g packet Take 17 g by mouth daily. 14 each 0   Semaglutide, 1 MG/DOSE, 4 MG/3ML SOPN Inject 1 mg as directed once a week. 12 mL 2   No current facility-administered medications on file prior to visit.    Review of Systems     Objective:  There were no vitals  filed for this visit. BP Readings from Last 3 Encounters:  09/13/22 122/78  08/16/22 122/84  06/07/22 118/84   Wt Readings from Last 3 Encounters:  09/13/22 270 lb 6 oz (122.6 kg)  08/16/22 278 lb (126.1 kg)  06/07/22 289 lb 4 oz (131.2 kg)   There is no height or weight on file to calculate BMI.    Physical Exam         Assessment & Plan:    See Problem List for Assessment and Plan of chronic medical problems.

## 2022-10-25 ENCOUNTER — Ambulatory Visit: Payer: Federal, State, Local not specified - PPO | Admitting: Internal Medicine

## 2022-10-26 ENCOUNTER — Other Ambulatory Visit: Payer: Self-pay | Admitting: Nurse Practitioner

## 2022-10-26 ENCOUNTER — Encounter: Payer: Self-pay | Admitting: Nurse Practitioner

## 2022-10-26 DIAGNOSIS — R002 Palpitations: Secondary | ICD-10-CM

## 2022-11-02 ENCOUNTER — Telehealth: Payer: Self-pay | Admitting: Internal Medicine

## 2022-11-02 NOTE — Telephone Encounter (Signed)
Patient would like to do a provider switch from Dr Carolan Clines to Dr Lucita Lora, Please advise

## 2022-11-06 ENCOUNTER — Ambulatory Visit: Payer: Federal, State, Local not specified - PPO | Admitting: Internal Medicine

## 2022-11-12 DIAGNOSIS — M25531 Pain in right wrist: Secondary | ICD-10-CM | POA: Diagnosis not present

## 2022-11-12 DIAGNOSIS — G4733 Obstructive sleep apnea (adult) (pediatric): Secondary | ICD-10-CM | POA: Diagnosis not present

## 2022-11-13 ENCOUNTER — Other Ambulatory Visit: Payer: Self-pay | Admitting: Internal Medicine

## 2022-11-13 ENCOUNTER — Ambulatory Visit: Payer: Federal, State, Local not specified - PPO | Attending: Internal Medicine | Admitting: Internal Medicine

## 2022-11-13 ENCOUNTER — Encounter: Payer: Self-pay | Admitting: Internal Medicine

## 2022-11-13 VITALS — BP 126/82 | HR 80 | Ht 63.0 in | Wt 267.0 lb

## 2022-11-13 DIAGNOSIS — G4733 Obstructive sleep apnea (adult) (pediatric): Secondary | ICD-10-CM | POA: Diagnosis not present

## 2022-11-13 DIAGNOSIS — R002 Palpitations: Secondary | ICD-10-CM | POA: Diagnosis not present

## 2022-11-13 DIAGNOSIS — R072 Precordial pain: Secondary | ICD-10-CM

## 2022-11-13 DIAGNOSIS — E119 Type 2 diabetes mellitus without complications: Secondary | ICD-10-CM | POA: Diagnosis not present

## 2022-11-13 MED ORDER — METOPROLOL TARTRATE 50 MG PO TABS: 50.0000 mg | ORAL_TABLET | Freq: Once | ORAL | 0 refills | Status: DC

## 2022-11-13 MED ORDER — IVABRADINE HCL 5 MG PO TABS: 10.0000 mg | ORAL_TABLET | Freq: Once | ORAL | 0 refills | Status: AC

## 2022-11-13 NOTE — Patient Instructions (Signed)
Medication Instructions:  -Take Metoprolol tartrate (Lopressor) 50 mg once, 2 hours before your CT Test. -Take Ivabradine (Corlanor) 10 mg once, 2 hours before your CT Test.  *If you need a refill on your cardiac medications before your next appointment, please call your pharmacy*   Lab Work: BMET today If you have labs (blood work) drawn today and your tests are completely normal, you will receive your results only by: MyChart Message (if you have MyChart) OR A paper copy in the mail If you have any lab test that is abnormal or we need to change your treatment, we will call you to review the results.   Testing/Procedures:  Your cardiac CT will be scheduled at the below location:   [facility] [address] [place], [place] [address] [phone]   If scheduled at [facility], please arrive at the [facility] and Children's Entrance (Entrance C2) of [facility] 30 minutes prior to test start time. You can use the FREE valet parking offered at entrance C (encouraged to control the heart rate for the test)  Proceed to the [facility] Radiology Department (first floor) to check-in and test prep.  All radiology patients and guests should use entrance C2 at [facility], accessed from [facility], even though the hospital's physical address listed is [address].    There is spacious parking and easy access to the radiology department from the [facility] Heart and Vascular entrance. Please enter here and check-in with the desk attendant.   Please follow these instructions carefully (unless otherwise directed):  An IV will be required for this test and Nitroglycerin will be given.   On the Night Before the Test: Be sure to Drink plenty of water. Do not consume any caffeinated/decaffeinated beverages or chocolate 12 hours prior to your test. Do not take any antihistamines 12 hours prior to your test.  On the Day of the  Test: Drink plenty of water until 1 hour prior to the test. Do not eat any food 1 hour prior to test. You may take your regular medications prior to the test.  Take metoprolol (Lopressor) 50 mg two hours prior to test. Take Ivabradine (Corlanor) 10 mg two hours before your test. If you take Furosemide/Hydrochlorothiazide/Spironolactone, please HOLD on the morning of the test. FEMALES- please wear underwire-free bra if available, avoid dresses & tight clothing       After the Test: Drink plenty of water. After receiving IV contrast, you may experience a mild flushed feeling. This is normal. On occasion, you may experience a mild rash up to 24 hours after the test. This is not dangerous. If this occurs, you can take Benadryl 25 mg and increase your fluid intake. If you experience trouble breathing, this can be serious. If it is severe call 911 IMMEDIATELY. If it is mild, please call our office. If you take any of these medications: Glipizide/Metformin, Avandament, Glucavance, please do not take 48 hours after completing test unless otherwise instructed.  We will call to schedule your test 2-4 weeks out understanding that some insurance companies will need an authorization prior to the service being performed.   For more information and frequently asked questions, please visit our website : [email or web address]  For non-scheduling related questions, please contact the cardiac imaging nurse navigator should you have any questions/concerns: Cardiac Imaging Nurse Navigators Direct Office Dial: [phone]   For scheduling needs, including cancellations and rescheduling, please call [place], [phone].    Follow-Up:  At [facility], you and your health needs are our priority.  As part of our continuing mission to provide you with exceptional heart care, we have created designated Provider Care Teams.  These Care Teams include your primary Cardiologist (physician) and  Advanced Practice Providers (APPs -  Physician Assistants and Nurse Practitioners) who all work together to provide you with the care you need, when you need it.   Your next appointment:   6 week(s)  Provider:   Parke Poisson, MD

## 2022-11-13 NOTE — Progress Notes (Unsigned)
Cardiology Office Note:  .   Date:  11/13/2022  ID:  [name], DOB 06-06-1980, MRN [id number] PCP: Elenore Paddy, NP  [facility] Providers Cardiologist:  Parke Poisson, MD    History of Present Illness: .   [name] is a 42 y.o. female.  Discussed the use of AI scribe software for clinical note transcription with the patient, who gave verbal consent to proceed.  History of Present Illness   The patient, with a history of prediabetes, gestational hypertension, and sleep apnea, presents with complaints of chest discomfort and palpitations. The discomfort, described as an achiness, is located in the middle and left side of the chest. The symptoms, which last a few minutes and resolve spontaneously, are usually experienced while the patient is at rest, but can also occur during physical activity. The patient reports that the chest discomfort and palpitations usually occur together.  The patient has a history of smoking for approximately 20 years, with a recent attempt to quit. The patient's smoking habit peaked at seven to eight cigarettes per day, and despite a recent reduction to two cigarettes per day, the patient acknowledges the need to quit completely.  The patient's family history is significant for heart disease and lung cancer. The patient's mother had a heart attack in her mid-fifties, and both of the patient's paternal grandparents died from lung cancer.  In the past, the patient has experienced episodes of dizziness and off-balance spells, which sometimes coincide with the chest discomfort. The patient also reports a history of high blood pressure during pregnancy, but currently has normal blood pressure readings.  The patient has been managing prediabetes with Ozempic since March, which has successfully brought her A1c back to normal. The patient also uses a CPAP machine for sleep apnea.  The patient underwent an EKG and a heart monitor test in September  due to the palpitations. The results showed some PVC beats, but overall, the heart function was within the normal range. The patient was referred to a specialist for further evaluation due to the ongoing symptoms.        ROS: negative except per HPI above.  Studies Reviewed: .        Results   LABS HbA1c: Normal (09/2022) LDL: 84 mg/dL (40/9811) Triglycerides: 143 mg/dL (91/4782) CBC: Normal (09/2022) Renal function: Normal (09/2022) Hepatic function: Normal (09/2022) TSH: Normal (09/2022)  DIAGNOSTIC EKG: Normal (09/2022) Echocardiography: Ejection fraction within normal range, heart valves normal, intracardiac pressures normal, occasional PVCs observed Holter monitor: Less than 1% PVCs     Risk Assessment/Calculations:             Physical Exam:   VS:  BP 126/82 (BP Location: Left Arm, Patient Position: Sitting, Cuff Size: Normal)   Pulse 80   Ht 5\' 3"  (1.6 m)   Wt 267 lb (121.1 kg)   SpO2 96%   BMI 47.30 kg/m    Wt Readings from Last 3 Encounters:  11/13/22 267 lb (121.1 kg)  09/13/22 270 lb 6 oz (122.6 kg)  08/16/22 278 lb (126.1 kg)     Physical Exam   VITALS: P- 80 CHEST: Normal breath sounds. CARDIOVASCULAR: Heart sounds normal.     GEN: Well nourished, well developed in no acute distress NECK: No JVD; No carotid bruits CARDIAC: RRR, no murmurs, rubs, gallops RESPIRATORY:  Clear to auscultation without rales, wheezing or rhonchi  ABDOMEN: Soft, non-tender, non-distended EXTREMITIES:  No edema; No deformity   ASSESSMENT AND PLAN: .  1. Precordial pain   2. Palpitations   3. OSA on CPAP   4. Diabetes mellitus without complication (HCC)     Assessment and Plan    Chest Discomfort and Palpitations Patient reports intermittent chest discomfort and palpitations, often occurring together. Previous EKG and echo were normal. PVCs noted on echo, but overall frequency is less than 1% of total heartbeats. Family history of heart disease and personal  history of smoking. -Order coronary CT scan to further evaluate for possible coronary artery disease. -Administer 50mg  of Metoprolol and 10mg  of Ivabradine prior to the scan to achieve optimal heart rate for imaging. -Schedule follow-up appointment in 4-6 weeks to discuss results.  Tobacco Use Patient reports a 20-year history of smoking, currently at 2 cigarettes per day, down from a maximum of 7-8 per day. Family history of lung cancer and COPD. -Encourage continued efforts to quit smoking, considering pharmacotherapy such as Chantix or nicotine replacement therapy if needed.  Diabetes Patient reports a history of diabetes, now controlled with Ozempic, with recent A1c within normal range. -Continue current management with Ozempic.  Gestational Hypertension Patient reports a history of hypertension during pregnancy, but no current issues with blood pressure. -No changes to current management needed.  Sleep Apnea Patient reports a history of sleep apnea, currently managed with CPAP. No evidence of cardiac sequela related to sleep apnea on echo. -Continue current management with CPAP.                Total time of encounter: 30 minutes total time of encounter, including 21 minutes spent in face-to-face patient care on the date of this encounter. This time includes coordination of care and counseling regarding above mentioned problem list. Remainder of non-face-to-face time involved reviewing chart documents/testing relevant to the patient encounter and documentation in the medical record. I have independently reviewed documentation from referring provider.   Weston Brass, MD, [facility]  [facility]

## 2022-11-14 LAB — BASIC METABOLIC PANEL
BUN/Creatinine Ratio: 14 (ref 9–23)
BUN: 12 mg/dL (ref 6–24)
CO2: 24 mmol/L (ref 20–29)
Calcium: 9.2 mg/dL (ref 8.7–10.2)
Chloride: 102 mmol/L (ref 96–106)
Creatinine, Ser: 0.88 mg/dL (ref 0.57–1.00)
Glucose: 70 mg/dL (ref 70–99)
Potassium: 4.9 mmol/L (ref 3.5–5.2)
Sodium: 137 mmol/L (ref 134–144)
eGFR: 84 mL/min/{1.73_m2} (ref >59)

## 2022-11-21 DIAGNOSIS — G5621 Lesion of ulnar nerve, right upper limb: Secondary | ICD-10-CM | POA: Diagnosis not present

## 2022-11-27 ENCOUNTER — Other Ambulatory Visit: Payer: Self-pay | Admitting: Nurse Practitioner

## 2022-11-27 DIAGNOSIS — Z124 Encounter for screening for malignant neoplasm of cervix: Secondary | ICD-10-CM | POA: Diagnosis not present

## 2022-11-27 DIAGNOSIS — Z01419 Encounter for gynecological examination (general) (routine) without abnormal findings: Secondary | ICD-10-CM | POA: Diagnosis not present

## 2022-11-27 DIAGNOSIS — Z1231 Encounter for screening mammogram for malignant neoplasm of breast: Secondary | ICD-10-CM | POA: Diagnosis not present

## 2022-11-27 DIAGNOSIS — Z1239 Encounter for other screening for malignant neoplasm of breast: Secondary | ICD-10-CM | POA: Diagnosis not present

## 2022-11-27 DIAGNOSIS — M1990 Unspecified osteoarthritis, unspecified site: Secondary | ICD-10-CM

## 2022-11-27 DIAGNOSIS — Z01411 Encounter for gynecological examination (general) (routine) with abnormal findings: Secondary | ICD-10-CM | POA: Diagnosis not present

## 2022-11-27 DIAGNOSIS — R7303 Prediabetes: Secondary | ICD-10-CM

## 2022-11-30 ENCOUNTER — Telehealth: Payer: Self-pay | Admitting: Internal Medicine

## 2022-11-30 NOTE — Telephone Encounter (Signed)
Attempted to call patient, no answer, voicemail not set up, unable to leave a message   Mychart message sent to patient

## 2022-11-30 NOTE — Telephone Encounter (Signed)
Called [facility] regarding pt's Ivabradine for upcoming procedure pharmacy tech states that they are currently working to fill prescription, when pt came to pick up metoprolol they didn't have the Ivabradine in stock. Pharmacy tech states that medication is likely in recent shipment received earlier today. She anticipates that medication will be filled by early next week. Pharmacy tech did not mention a need for provider confirmation. Cost of medication checked with pharmacy tech, she says it will be at no cost to the pt. Called pt with this information. Pt verbalizes understanding.

## 2022-11-30 NOTE — Telephone Encounter (Signed)
Pt states she only received one of the two medications she was told to get for before her CT. Please advise

## 2022-11-30 NOTE — Telephone Encounter (Signed)
Patient identification verified by 2 forms. Marilynn Rail, RN    Called and spoke to patient  Patient states:   -was able to pick up metoprolol   -pharmacy did not give Ivabradine, waiting on provider response  Informed patient message sent to provider  Patient has no further questions at this time

## 2022-11-30 NOTE — Telephone Encounter (Signed)
Called and spoke to [facility]  Pharmacy states:   -Ivabradine Rx needs a PA, cost is $13  -was able to process it for $13.99 will be available for pick up today

## 2022-12-03 MED ORDER — IVABRADINE HCL 5 MG PO TABS: 10.0000 mg | ORAL_TABLET | Freq: Once | ORAL | 0 refills | Status: AC

## 2022-12-03 NOTE — Telephone Encounter (Signed)
Patient identification verified by 2 forms. Marilynn Rail, RN    Called and spoke to patient  Patient states received message from pharmacy stating they need updates from provider    During call RN called [facility]  Pharmacy states:   -Initial quantity sent on 11/05 does not equal to dose ordered   -will need a updated prescription with appropriate quantity   -confirmed received new prescription sent by RN  -can order prescription for pick up tomorrow afternoon, cost of $18.79    Advised patient:   -RN confirmed with pharmacy Rx available for pick up tomorrow afternoon with cost of $18.79  -Per 11/5 visit take 2 hours before CT   -contact office if unable to pick up   Patient verbalized understanding, no questions at this time

## 2022-12-12 ENCOUNTER — Encounter (HOSPITAL_COMMUNITY): Payer: Self-pay

## 2022-12-14 ENCOUNTER — Ambulatory Visit (HOSPITAL_COMMUNITY)
Admission: RE | Admit: 2022-12-14 | Discharge: 2022-12-14 | Disposition: A | Discharge status: Home / Self Care | Payer: Federal, State, Local not specified - PPO | Source: Ambulatory Visit | Attending: Internal Medicine | Admitting: Internal Medicine

## 2022-12-14 DIAGNOSIS — R072 Precordial pain: Secondary | ICD-10-CM | POA: Insufficient documentation

## 2022-12-14 MED ORDER — NITROGLYCERIN 0.4 MG SL SUBL
0.8000 mg | SUBLINGUAL_TABLET | Freq: Once | SUBLINGUAL | Status: AC
  Administered 2022-12-14: 0.8 mg via SUBLINGUAL

## 2022-12-14 MED ORDER — NITROGLYCERIN 0.4 MG SL SUBL
SUBLINGUAL_TABLET | SUBLINGUAL | Status: AC
  Filled 2022-12-14: qty 2

## 2022-12-14 MED ORDER — IOHEXOL 350 MG/ML SOLN
100.0000 mL | Freq: Once | INTRAVENOUS | Status: AC | PRN
  Administered 2022-12-14: 100 mL via INTRAVENOUS

## 2022-12-17 ENCOUNTER — Other Ambulatory Visit (HOSPITAL_COMMUNITY): Payer: Self-pay

## 2022-12-20 ENCOUNTER — Other Ambulatory Visit (HOSPITAL_COMMUNITY): Payer: Self-pay

## 2022-12-20 ENCOUNTER — Ambulatory Visit: Payer: Federal, State, Local not specified - PPO | Attending: Internal Medicine | Admitting: Internal Medicine

## 2022-12-20 ENCOUNTER — Telehealth: Payer: Self-pay

## 2022-12-20 ENCOUNTER — Ambulatory Visit: Payer: Federal, State, Local not specified - PPO | Admitting: Nurse Practitioner

## 2022-12-20 VITALS — BP 132/86 | HR 76 | Ht 63.0 in | Wt 264.4 lb

## 2022-12-20 VITALS — BP 128/88 | HR 71 | Temp 98.0°F | Ht 63.0 in | Wt 261.4 lb

## 2022-12-20 DIAGNOSIS — E119 Type 2 diabetes mellitus without complications: Secondary | ICD-10-CM

## 2022-12-20 DIAGNOSIS — Z6841 Body Mass Index (BMI) 40.0 and over, adult: Secondary | ICD-10-CM | POA: Diagnosis not present

## 2022-12-20 DIAGNOSIS — R072 Precordial pain: Secondary | ICD-10-CM | POA: Diagnosis not present

## 2022-12-20 DIAGNOSIS — E559 Vitamin D deficiency, unspecified: Secondary | ICD-10-CM

## 2022-12-20 DIAGNOSIS — R002 Palpitations: Secondary | ICD-10-CM | POA: Diagnosis not present

## 2022-12-20 DIAGNOSIS — Z7985 Long-term (current) use of injectable non-insulin antidiabetic drugs: Secondary | ICD-10-CM

## 2022-12-20 DIAGNOSIS — Z23 Encounter for immunization: Secondary | ICD-10-CM | POA: Insufficient documentation

## 2022-12-20 DIAGNOSIS — G4733 Obstructive sleep apnea (adult) (pediatric): Secondary | ICD-10-CM | POA: Diagnosis not present

## 2022-12-20 DIAGNOSIS — E66813 Obesity, class 3: Secondary | ICD-10-CM

## 2022-12-20 DIAGNOSIS — Z7984 Long term (current) use of oral hypoglycemic drugs: Secondary | ICD-10-CM

## 2022-12-20 NOTE — Progress Notes (Signed)
Established Patient Office Visit  Subjective   Patient ID: [name], female    DOB: [birth year]  Age: 42 y.o. MRN: [id number]  Chief Complaint  Patient presents with   Medical Management of Chronic Issues    3  month follow up   Type 2 diabetes/obesity: Type II DM has been well-controlled on metformin 500 mg daily and Ozempic 1 mg weekly.  She continues tolerate his medications well, denies any concerning side effects.  She has also been losing weight, reports 45 pound weight loss over the last 9 months.  Overall feeling better.  Vitamin D deficiency: Continues on vitamin D3 supplementation, 2000 IUs daily.  Palpitations: Undergoing evaluation with cardiology.  Thus far long-term cardiac monitor identified some PVCs, but less than 1% of heart rhythm was affected by this.  Echocardiogram did identify left-sided enlargement of the heart.  Blood pressure has been well-controlled.  She also underwent CT cardiac score which came back 0.  She reports that she continues to have palpitations but they seem to be less often.  She has a follow-up with them later today.     Review of Systems  Constitutional:  Positive for weight loss (intentional).  Respiratory:  Negative for shortness of breath.   Cardiovascular:  Positive for palpitations. Negative for chest pain.  Gastrointestinal:  Negative for abdominal pain.      Objective:     BP 128/88 (Cuff Size: Normal)   Pulse 71   Temp 98 F (36.7 C) (Temporal)   Ht 5\' 3"  (1.6 m)   Wt 261 lb 6 oz (118.6 kg)   LMP 12/12/2022   SpO2 93%   BMI 46.30 kg/m  BP Readings from Last 3 Encounters:  12/20/22 128/88  12/14/22 125/80  11/13/22 126/82   Wt Readings from Last 3 Encounters:  12/20/22 261 lb 6 oz (118.6 kg)  11/13/22 267 lb (121.1 kg)  09/13/22 270 lb 6 oz (122.6 kg)      Physical Exam Vitals reviewed.  Constitutional:      General: She is not in acute distress.    Appearance: Normal appearance.  HENT:     Head:  Normocephalic and atraumatic.  Cardiovascular:     Rate and Rhythm: Normal rate and regular rhythm.     Pulses: Normal pulses.     Heart sounds: Normal heart sounds.  Pulmonary:     Effort: Pulmonary effort is normal.     Breath sounds: Normal breath sounds.  Skin:    General: Skin is warm and dry.  Neurological:     General: No focal deficit present.     Mental Status: She is alert and oriented to person, place, and time.  Psychiatric:        Mood and Affect: Mood normal.        Behavior: Behavior normal.        Judgment: Judgment normal.      No results found for any visits on 12/20/22.    The 10-year ASCVD risk score (Arnett DK, et al., 2019) is: 2.9%    Assessment & Plan:   Problem List Items Addressed This Visit       Endocrine   Diabetes mellitus without complication (HCC) - Primary   Chronic A1c at goal Continue metformin and Ozempic Due to have A1c rechecked today.  Labs ordered, further recommendations may be made based upon these results.      Relevant Orders   CBC   Comprehensive metabolic panel   Hemoglobin  A1c   Lipid panel   TSH   VITAMIN D 25 Hydroxy (Vit-D Deficiency, Fractures)     Other   Class 3 severe obesity with serious comorbidity and body mass index (BMI) of 50.0 to 59.9 in adult [facility])   Chronic Patient encouraged to continue focusing on lifestyle modification as well as to continue on her Ozempic which she is taking for type 2 diabetes with positive side effect of weight loss.      Relevant Orders   CBC   Comprehensive metabolic panel   Hemoglobin A1c   Lipid panel   TSH   VITAMIN D 25 Hydroxy (Vit-D Deficiency, Fractures)   Vitamin D deficiency   Chronic Will check serum vitamin D level at next lab draw, further conditions may be made based upon these results.      Relevant Orders   CBC   Comprehensive metabolic panel   Hemoglobin A1c   Lipid panel   TSH   VITAMIN D 25 Hydroxy (Vit-D Deficiency, Fractures)   Needs flu  shot   Patient denies allergy to eggs or latex.  Also denies any history of severe reaction related to flu shot administration.  Flu shot administered, VIS provided.       Return in about 3 months (around 03/20/2023) for CPE with Maralyn Sago.    Elenore Paddy, NP

## 2022-12-20 NOTE — Assessment & Plan Note (Signed)
Chronic A1c at goal Continue metformin and Ozempic Due to have A1c rechecked today.  Labs ordered, further recommendations may be made based upon these results.

## 2022-12-20 NOTE — Addendum Note (Signed)
Addended by: Cathleen Fears, Jonatha Gagen P on: 12/20/2022 10:04 AM   Modules accepted: Orders

## 2022-12-20 NOTE — Telephone Encounter (Signed)
Pharmacy Patient Advocate Encounter   Received notification from Pt Calls Messages that prior authorization for Ozempic 4mg /42ml is required/requested.   Insurance verification completed.   The patient is insured through [facility] .   Per test claim: PA required; PA submitted to above mentioned insurance via CoverMyMeds Key/confirmation #/EOC  [id number] Status is pending

## 2022-12-20 NOTE — Telephone Encounter (Signed)
PA for ozempic.

## 2022-12-20 NOTE — Assessment & Plan Note (Signed)
Patient denies allergy to eggs or latex.  Also denies any history of severe reaction related to flu shot administration.  Flu shot administered, VIS provided.

## 2022-12-20 NOTE — Assessment & Plan Note (Signed)
Chronic Patient encouraged to continue focusing on lifestyle modification as well as to continue on her Ozempic which she is taking for type 2 diabetes with positive side effect of weight loss.

## 2022-12-20 NOTE — Assessment & Plan Note (Signed)
Chronic Will check serum vitamin D level at next lab draw, further conditions may be made based upon these results.

## 2022-12-20 NOTE — Telephone Encounter (Signed)
Pharmacy Patient Advocate Encounter  Received notification from [facility] that Prior Authorization for Ozempic 4mg /60ml has been APPROVED from 11/20/22 to 12/20/23

## 2022-12-20 NOTE — Progress Notes (Signed)
  Cardiology Office Note:  .   Date:  12/20/2022  ID:  [name], DOB 12-28-1980, MRN [id number] PCP: Elenore Paddy, NP  [facility] Providers Cardiologist:  Parke Poisson, MD    History of Present Illness: .   [name] is a 42 y.o. female.  Discussed the use of AI scribe software for clinical note transcription with the patient, who gave verbal consent to proceed.  History of Present Illness   The patient, with a history of chest discomfort and palpitations, presents for a follow-up visit after undergoing several tests. She reports that her symptoms have improved, with less frequent chest pain and palpitations. The patient attributes this improvement to a reduction in smoking, although she is still struggling to quit completely. She is finding it difficult to quit, especially as her spouse continues to smoke. The patient also mentions a weight loss of 45 pounds in the past nine months.        ROS: negative except per HPI above.  Studies Reviewed: .        Results   RADIOLOGY Coronary CT: No calcium, no plaque, small patent foramen ovale  DIAGNOSTIC Echocardiography: Normal right side, normal heart valves, increased thickness of heart walls, lower end of normal pumping function Cardiac monitor: No atrial fibrillation, occasional premature beats     Risk Assessment/Calculations:             Physical Exam:   VS:  BP 132/86 (BP Location: Left Arm, Patient Position: Sitting, Cuff Size: Normal)   Pulse 76   Ht 5\' 3"  (1.6 m)   Wt 264 lb 6.4 oz (119.9 kg)   LMP 12/12/2022   SpO2 96%   BMI 46.84 kg/m    Wt Readings from Last 3 Encounters:  12/20/22 264 lb 6.4 oz (119.9 kg)  12/20/22 261 lb 6 oz (118.6 kg)  11/13/22 267 lb (121.1 kg)     Physical Exam   CARDIOVASCULAR: Normal heart sounds.     GEN: Well nourished, well developed in no acute distress NECK: No JVD; No carotid bruits CARDIAC: RRR, no murmurs, rubs, gallops RESPIRATORY:  Clear to  auscultation without rales, wheezing or rhonchi  ABDOMEN: Soft, non-tender, non-distended EXTREMITIES:  No edema; No deformity   ASSESSMENT AND PLAN: .    Assessment & Plan Precordial pain  Palpitations  OSA on CPAP  Diabetes mellitus without complication (HCC)      Assessment and Plan    Cardiac Health Normal pumping function of the heart with slightly increased thickness of the heart walls. No significant plaque or calcium in the arteries. Possible patent foramen ovale noted, but of no clinical consequence. -Encouraged smoking cessation  -Plan to repeat echocardiogram in 1-2 years to monitor heart function and wall thickness.  Smoking Patient is a current smoker and is attempting to quit. -Strongly advised smoking cessation for overall health improvement and to prevent further cardiac complications.  Weight Management Patient has lost 45 pounds in the past 9 months. -Encouraged continuation of weight loss efforts for overall health improvement.  Follow-up Given the overall reassuring cardiac test results and the patient's ongoing efforts to quit smoking and lose weight, a follow-up visit in 1 year is planned. -Schedule follow-up visit in 1 year.              Weston Brass, MD, [facility] [place]  [facility]

## 2022-12-20 NOTE — Patient Instructions (Signed)
Medication Instructions:  Your physician recommends that you continue on your current medications as directed. Please refer to the Current Medication list given to you today.  *If you need a refill on your cardiac medications before your next appointment, please call your pharmacy*  Lab Work: None  Follow-Up: At [facility], you and your health needs are our priority.  As part of our continuing mission to provide you with exceptional heart care, we have created designated Provider Care Teams.  These Care Teams include your primary Cardiologist (physician) and Advanced Practice Providers (APPs -  Physician Assistants and Nurse Practitioners) who all work together to provide you with the care you need, when you need it.  Your next appointment:   1 year(s)  Provider:   Parke Poisson, MD

## 2022-12-25 DIAGNOSIS — G5621 Lesion of ulnar nerve, right upper limb: Secondary | ICD-10-CM | POA: Diagnosis not present

## 2023-01-14 ENCOUNTER — Ambulatory Visit: Payer: Federal, State, Local not specified - PPO | Admitting: Nurse Practitioner

## 2023-01-16 ENCOUNTER — Encounter: Payer: Self-pay | Admitting: Nurse Practitioner

## 2023-01-16 ENCOUNTER — Other Ambulatory Visit: Payer: Federal, State, Local not specified - PPO

## 2023-01-16 DIAGNOSIS — Z6841 Body Mass Index (BMI) 40.0 and over, adult: Secondary | ICD-10-CM | POA: Diagnosis not present

## 2023-01-16 DIAGNOSIS — E119 Type 2 diabetes mellitus without complications: Secondary | ICD-10-CM

## 2023-01-16 DIAGNOSIS — E66813 Obesity, class 3: Secondary | ICD-10-CM

## 2023-01-16 DIAGNOSIS — E559 Vitamin D deficiency, unspecified: Secondary | ICD-10-CM | POA: Diagnosis not present

## 2023-01-16 LAB — COMPREHENSIVE METABOLIC PANEL
ALT: 16 U/L (ref 0–35)
AST: 14 U/L (ref 0–37)
Albumin: 3.9 g/dL (ref 3.5–5.2)
Alkaline Phosphatase: 68 U/L (ref 39–117)
BUN: 14 mg/dL (ref 6–23)
CO2: 24 meq/L (ref 19–32)
Calcium: 9.1 mg/dL (ref 8.4–10.5)
Chloride: 106 meq/L (ref 96–112)
Creatinine, Ser: 0.86 mg/dL (ref 0.40–1.20)
GFR: 82.79 mL/min (ref >60.00)
Glucose, Bld: 88 mg/dL (ref 70–99)
Potassium: 3.8 meq/L (ref 3.5–5.1)
Sodium: 139 meq/L (ref 135–145)
Total Bilirubin: 0.4 mg/dL (ref 0.2–1.2)
Total Protein: 6.7 g/dL (ref 6.0–8.3)

## 2023-01-16 LAB — CBC
HCT: 40.1 % (ref 36.0–46.0)
Hemoglobin: 13.7 g/dL (ref 12.0–15.0)
MCHC: 34.2 g/dL (ref 30.0–36.0)
MCV: 88.3 fL (ref 78.0–100.0)
Platelets: 269 10*3/uL (ref 150.0–400.0)
RBC: 4.54 Mil/uL (ref 3.87–5.11)
RDW: 13.3 % (ref 11.5–15.5)
WBC: 6.6 10*3/uL (ref 4.0–10.5)

## 2023-01-16 LAB — LIPID PANEL
Cholesterol: 155 mg/dL (ref 0–200)
HDL: 38.3 mg/dL — ABNORMAL LOW (ref >39.00)
LDL Cholesterol: 98 mg/dL (ref 0–99)
NonHDL: 117.01
Total CHOL/HDL Ratio: 4
Triglycerides: 96 mg/dL (ref 0.0–149.0)
VLDL: 19.2 mg/dL (ref 0.0–40.0)

## 2023-01-16 LAB — VITAMIN D 25 HYDROXY (VIT D DEFICIENCY, FRACTURES): VITD: 58 ng/mL (ref 30.00–100.00)

## 2023-01-16 LAB — TSH: TSH: 3.13 u[IU]/mL (ref 0.35–5.50)

## 2023-01-16 LAB — HEMOGLOBIN A1C: Hgb A1c MFr Bld: 5.7 % (ref 4.6–6.5)

## 2023-01-24 DIAGNOSIS — N6489 Other specified disorders of breast: Secondary | ICD-10-CM | POA: Diagnosis not present

## 2023-01-24 DIAGNOSIS — R928 Other abnormal and inconclusive findings on diagnostic imaging of breast: Secondary | ICD-10-CM | POA: Diagnosis not present

## 2023-01-31 DIAGNOSIS — G5621 Lesion of ulnar nerve, right upper limb: Secondary | ICD-10-CM | POA: Diagnosis not present

## 2023-02-07 ENCOUNTER — Other Ambulatory Visit: Payer: Self-pay | Admitting: Internal Medicine

## 2023-02-07 ENCOUNTER — Other Ambulatory Visit: Payer: Self-pay

## 2023-02-11 DIAGNOSIS — N6310 Unspecified lump in the right breast, unspecified quadrant: Secondary | ICD-10-CM | POA: Diagnosis not present

## 2023-02-11 DIAGNOSIS — G4733 Obstructive sleep apnea (adult) (pediatric): Secondary | ICD-10-CM | POA: Diagnosis not present

## 2023-02-20 DIAGNOSIS — N6312 Unspecified lump in the right breast, upper inner quadrant: Secondary | ICD-10-CM | POA: Insufficient documentation

## 2023-02-21 ENCOUNTER — Other Ambulatory Visit: Payer: Self-pay | Admitting: General Surgery

## 2023-02-21 DIAGNOSIS — N6312 Unspecified lump in the right breast, upper inner quadrant: Secondary | ICD-10-CM

## 2023-02-26 DIAGNOSIS — M25641 Stiffness of right hand, not elsewhere classified: Secondary | ICD-10-CM | POA: Diagnosis not present

## 2023-03-05 ENCOUNTER — Ambulatory Visit
Admission: RE | Admit: 2023-03-05 | Discharge: 2023-03-05 | Disposition: A | Discharge status: Home / Self Care | Payer: Federal, State, Local not specified - PPO | Source: Ambulatory Visit | Attending: General Surgery | Admitting: General Surgery

## 2023-03-05 ENCOUNTER — Other Ambulatory Visit: Payer: Self-pay | Admitting: General Surgery

## 2023-03-05 DIAGNOSIS — N6312 Unspecified lump in the right breast, upper inner quadrant: Secondary | ICD-10-CM

## 2023-03-05 DIAGNOSIS — N6311 Unspecified lump in the right breast, upper outer quadrant: Secondary | ICD-10-CM | POA: Diagnosis not present

## 2023-03-11 ENCOUNTER — Ambulatory Visit: Payer: Federal, State, Local not specified - PPO | Admitting: Adult Health

## 2023-03-11 ENCOUNTER — Encounter: Payer: Self-pay | Admitting: Adult Health

## 2023-03-11 VITALS — BP 138/80 | HR 82 | Ht 62.0 in | Wt 257.6 lb

## 2023-03-11 DIAGNOSIS — G4733 Obstructive sleep apnea (adult) (pediatric): Secondary | ICD-10-CM

## 2023-03-11 NOTE — Progress Notes (Signed)
 @Patient  ID: [name], female    DOB: 1980-04-21, 43 y.o.   MRN: [id number]  Chief Complaint  Patient presents with   Follow-up    Needs a new CPAP order/prescription.    Referring provider: Elenore Paddy, NP  HPI: 43 year old female active smoker followed for obstructive sleep apnea on nocturnal CPAP Diagnosed with sleep apnea in 2018 started on CPAP  TEST/EVENTS :   03/11/2023 Follow up ; OSA Patient presents for a 1 year follow-up.  Patient has underlying sleep apnea is on nocturnal CPAP.  Patient says she is doing well on CPAP.  Wears her CPAP every single night.  Says she feels that she benefits from CPAP.  She has decreased daytime sleepiness.  DME is [facility].  Uses nasal pillows.  CPAP download shows excellent usage with daily usage at 10hrs, AHI 0.8/hr. patient says she loves her CPAP machine cannot live without it. Says that she needs a new CPAP machine as her machine has displayed a new message that she has exceeded her motor life expectancy on the CPAP.  Order for new CPAP was placed. Does to continue smoke.  We discussed smoking cessation. Has cut down on smoking to 5 cigs.   Works for [employer], stressful time.   Allergies  Allergen Reactions   Codeine     STOMACH CRAMPING   Doxycycline Nausea And Vomiting    Immunization History  Administered Date(s) Administered   Influenza Whole 10/30/2011   Influenza, Seasonal, Injecte, Preservative Fre 09/28/2014, 12/20/2022   Influenza,inj,Quad PF,6+ Mos 10/21/2012, 10/03/2017, 09/22/2020   Influenza-Unspecified 10/08/2013, 11/02/2015   Moderna Sars-Covid-2 Vaccination 05/28/2019, 06/25/2019   Td 01/08/2005   Tdap 09/28/2014    Past Medical History:  Diagnosis Date   Anxiety    Arthritis    Back pain    Bilateral swelling of feet    Chest pain    Cluster headaches    Depression    Eczema    GERD (gastroesophageal reflux disease)    Gestational diabetes 2012, 2016   Hyperlipidemia    Hypertension    no meds  currently   Joint pain    Obesity    Osteoarthritis    PCOS (polycystic ovarian syndrome)    Pre-diabetes    Sciatica    Shortness of breath    Sleep apnea    CPAP   Snoring    sleep study 02/2012 with min AHI events   Vitamin D deficiency     Tobacco History: Social History   Tobacco Use  Smoking Status Every Day   Current packs/day: 0.50   Average packs/day: 0.5 packs/day for 15.0 years (7.5 ttl pk-yrs)   Types: Cigarettes  Smokeless Tobacco Never  Tobacco Comments   Referred to 1-800-quitline. Patients is smoking 5 cigarettes a day   Ready to quit: No Counseling given: Yes Tobacco comments: Referred to 1-800-quitline. Patients is smoking 5 cigarettes a day   Outpatient Medications Prior to Visit  Medication Sig Dispense Refill   atorvastatin (LIPITOR) 10 MG tablet Take 1 tablet (10 mg total) by mouth at bedtime. 90 tablet 3   Biotin 5000 MCG CAPS Take 1 capsule by mouth.     busPIRone (BUSPAR) 10 MG tablet Take 1 tablet (10 mg total) by mouth 3 (three) times daily. For mood control 270 tablet 1   celecoxib (CELEBREX) 100 MG capsule TAKE 1 CAPSULE BY MOUTH TWICE A DAY 180 capsule 3   cetirizine (ZYRTEC) 10 MG tablet TAKE 1 TABLET BY MOUTH  EVERY DAY 90 tablet 0   Cholecalciferol (VITAMIN D3) 50 MCG (2000 UT) TABS TAKE 1 TABLET BY MOUTH EVERY DAY 90 tablet PRN   docusate sodium (COLACE) 100 MG capsule Take 1 capsule (100 mg total) by mouth daily. 90 capsule 3   FLUoxetine (PROZAC) 20 MG capsule TAKE 3 CAPSULES BY MOUTH DAILY FOR MOOD CONTROL 270 capsule 1   metFORMIN (GLUCOPHAGE) 500 MG tablet TAKE 1/2 TABLET BY MOUTH DAILY WITH FIRST MEAL FOR 2 WEEKS THEN 1 TABLET DAILY WITH FIRST MEAL 90 tablet 1   Semaglutide, 1 MG/DOSE, 4 MG/3ML SOPN Inject 1 mg as directed once a week. 12 mL 2   JUNEL 1/20 1-20 MG-MCG tablet Take 1 tablet by mouth daily. (Patient not taking: Reported on 03/11/2023)     metoprolol tartrate (LOPRESSOR) 50 MG tablet Take 1 tablet (50 mg total) by mouth  once for 1 dose. Take 2 hours before your Coronary CT test 1 tablet 0   guaiFENesin (MUCINEX) 600 MG 12 hr tablet Take 2 tablets (1,200 mg total) by mouth 2 (two) times daily as needed. (Patient not taking: Reported on 03/11/2023) 60 tablet 5   meclizine (ANTIVERT) 25 MG tablet Take 1 tablet (25 mg total) by mouth 3 (three) times daily as needed for dizziness. (Patient not taking: Reported on 03/11/2023) 30 tablet 1   polyethylene glycol (MIRALAX / GLYCOLAX) 17 g packet Take 17 g by mouth daily. (Patient not taking: Reported on 03/11/2023) 14 each 0   No facility-administered medications prior to visit.     Review of Systems:   Constitutional:   No  weight loss, night sweats,  Fevers, chills, fatigue, or  lassitude.  HEENT:   No headaches,  Difficulty swallowing,  Tooth/dental problems, or  Sore throat,                No sneezing, itching, ear ache, nasal congestion, post nasal drip,   CV:  No chest pain,  Orthopnea, PND, swelling in lower extremities, anasarca, dizziness, palpitations, syncope.   GI  No heartburn, indigestion, abdominal pain, nausea, vomiting, diarrhea, change in bowel habits, loss of appetite, bloody stools.   Resp: No shortness of breath with exertion or at rest.  No excess mucus, no productive cough,  No non-productive cough,  No coughing up of blood.  No change in color of mucus.  No wheezing.  No chest wall deformity  Skin: no rash or lesions.  GU: no dysuria, change in color of urine, no urgency or frequency.  No flank pain, no hematuria   MS:  No joint pain or swelling.  No decreased range of motion.  No back pain.    Physical Exam  BP 138/80 (BP Location: Left Arm, Patient Position: Sitting)   Pulse 82   Ht 5\' 2"  (1.575 m)   Wt 257 lb 9.6 oz (116.8 kg)   SpO2 99%   BMI 47.12 kg/m   GEN: A/Ox3; pleasant , NAD, well nourished    HEENT:  [place]/AT,  , NOSE-clear, THROAT-clear, no lesions, no postnasal drip or exudate noted. Class 2-3 MP airway   NECK:  Supple  w/ fair ROM; no JVD; normal carotid impulses w/o bruits; no thyromegaly or nodules palpated; no lymphadenopathy.    RESP  Clear  P & A; w/o, wheezes/ rales/ or rhonchi. no accessory muscle use, no dullness to percussion  CARD:  RRR, no m/r/g, no peripheral edema, pulses intact, no cyanosis or clubbing.  GI:   Soft & nt; nml bowel  sounds; no organomegaly or masses detected.   Musco: Warm bil, no deformities or joint swelling noted.   Neuro: alert, no focal deficits noted.    Skin: Warm, no lesions or rashes    Lab Results:     Administration History     None           No data to display          No results found for: "NITRICOXIDE"      Assessment & Plan:   OSA on CPAP Excellent control compliance on nocturnal CPAP.  Continue on current settings Order for new CPAP machine sent to [facility] DME company  Plan  Patient Instructions  Continue on CPAP At bedtime  .  Order for new CPAP./  Keep up the good work Work on healthy weight  Do not drive if sleepy  Work on quitting smoking  Follow up with Dr. Vassie Loll  or Laurette Villescas NP  In 1 year and As needed        Rubye Oaks, NP 03/11/2023

## 2023-03-11 NOTE — Patient Instructions (Addendum)
 Continue on CPAP At bedtime  .  Order for new CPAP./  Keep up the good work Work on [employer]  Do not drive if sleepy  Work on quitting smoking  Follow up with Dr. Vassie Loll  or Pleas Carneal NP  In 1 year and As needed

## 2023-03-11 NOTE — Assessment & Plan Note (Signed)
 Excellent control compliance on nocturnal CPAP.  Continue on current settings Order for new CPAP machine sent to [facility] DME company  Plan  Patient Instructions  Continue on CPAP At bedtime  .  Order for new CPAP./  Keep up the good work Work on [employer]  Do not drive if sleepy  Work on quitting smoking  Follow up with Dr. Vassie Loll  or Surina Storts NP  In 1 year and As needed

## 2023-03-20 ENCOUNTER — Encounter: Payer: Self-pay | Admitting: Podiatry

## 2023-03-20 ENCOUNTER — Encounter: Payer: Self-pay | Admitting: Nurse Practitioner

## 2023-03-20 ENCOUNTER — Other Ambulatory Visit: Payer: Self-pay | Admitting: Nurse Practitioner

## 2023-03-20 ENCOUNTER — Ambulatory Visit: Admitting: Podiatry

## 2023-03-20 DIAGNOSIS — E785 Hyperlipidemia, unspecified: Secondary | ICD-10-CM

## 2023-03-20 DIAGNOSIS — L600 Ingrowing nail: Secondary | ICD-10-CM

## 2023-03-20 MED ORDER — ATORVASTATIN CALCIUM 20 MG PO TABS: 20.0000 mg | ORAL_TABLET | Freq: Every day | ORAL | 3 refills | Status: AC

## 2023-03-20 NOTE — Progress Notes (Signed)
 Subjective:  Patient ID: [name], female    DOB: September 30, 1980,  MRN: [id number]  Chief Complaint  Patient presents with   Ingrown Toenail    Bilateral ingrown     43 y.o. female presents with the above complaint.  Patient presents with bilateral hallux medial border ingrown painful to touch is progressive gotten worse worse with ambulation as pressure she would like to have removed she has not seen anyone as prior to seeing me denies any other acute complaints.   Review of Systems: Negative except as noted in the HPI. Denies N/V/F/Ch.  Past Medical History:  Diagnosis Date   Anxiety    Arthritis    Back pain    Bilateral swelling of feet    Chest pain    Cluster headaches    Depression    Eczema    GERD (gastroesophageal reflux disease)    Gestational diabetes 2012, 2016   Hyperlipidemia    Hypertension    no meds currently   Joint pain    Obesity    Osteoarthritis    PCOS (polycystic ovarian syndrome)    Pre-diabetes    Sciatica    Shortness of breath    Sleep apnea    CPAP   Snoring    sleep study 02/2012 with min AHI events   Vitamin D deficiency     Current Outpatient Medications:    ivabradine (CORLANOR) 5 MG TABS tablet, Take 10 mg by mouth once., Disp: , Rfl:    atorvastatin (LIPITOR) 10 MG tablet, Take 1 tablet (10 mg total) by mouth at bedtime., Disp: 90 tablet, Rfl: 3   Biotin 5000 MCG CAPS, Take 1 capsule by mouth., Disp: , Rfl:    busPIRone (BUSPAR) 10 MG tablet, Take 1 tablet (10 mg total) by mouth 3 (three) times daily. For mood control, Disp: 270 tablet, Rfl: 1   celecoxib (CELEBREX) 100 MG capsule, TAKE 1 CAPSULE BY MOUTH TWICE A DAY, Disp: 180 capsule, Rfl: 3   cetirizine (ZYRTEC) 10 MG tablet, TAKE 1 TABLET BY MOUTH EVERY DAY, Disp: 90 tablet, Rfl: 0   Cholecalciferol (VITAMIN D3) 50 MCG (2000 UT) TABS, TAKE 1 TABLET BY MOUTH EVERY DAY, Disp: 90 tablet, Rfl: PRN   docusate sodium (COLACE) 100 MG capsule, Take 1 capsule (100 mg total) by  mouth daily., Disp: 90 capsule, Rfl: 3   FLUoxetine (PROZAC) 20 MG capsule, TAKE 3 CAPSULES BY MOUTH DAILY FOR MOOD CONTROL, Disp: 270 capsule, Rfl: 1   JUNEL 1/20 1-20 MG-MCG tablet, Take 1 tablet by mouth daily. (Patient not taking: Reported on 03/11/2023), Disp: , Rfl:    metFORMIN (GLUCOPHAGE) 500 MG tablet, TAKE 1/2 TABLET BY MOUTH DAILY WITH FIRST MEAL FOR 2 WEEKS THEN 1 TABLET DAILY WITH FIRST MEAL, Disp: 90 tablet, Rfl: 1   metoprolol tartrate (LOPRESSOR) 50 MG tablet, Take 1 tablet (50 mg total) by mouth once for 1 dose. Take 2 hours before your Coronary CT test, Disp: 1 tablet, Rfl: 0   omeprazole (PRILOSEC) 40 MG capsule, TAKE 1 CAPSULE BY MOUTH DAILY 30 MINUTES BEFORE BREAKFAST, Disp: , Rfl:    polyethylene glycol powder ([employer] PURELAX) 17 GM/SCOOP powder, DISSOLVE 17 GRAMS INTO WATER AND DRINK BY MOUTH EVERY DAY, Disp: , Rfl:    Semaglutide, 1 MG/DOSE, 4 MG/3ML SOPN, Inject 1 mg as directed once a week., Disp: 12 mL, Rfl: 2  Social History   Tobacco Use  Smoking Status Every Day   Current packs/day: 0.50   Average  packs/day: 0.5 packs/day for 15.0 years (7.5 ttl pk-yrs)   Types: Cigarettes  Smokeless Tobacco Never  Tobacco Comments   Referred to 1-800-quitline. Patients is smoking 5 cigarettes a day    Allergies  Allergen Reactions   Codeine     STOMACH CRAMPING   Doxycycline Nausea And Vomiting   Objective:  There were no vitals filed for this visit. There is no height or weight on file to calculate BMI. Constitutional Well developed. Well nourished.  Vascular Dorsalis pedis pulses palpable bilaterally. Posterior tibial pulses palpable bilaterally. Capillary refill normal to all digits.  No cyanosis or clubbing noted. Pedal hair growth normal.  Neurologic Normal speech. Oriented to person, place, and time. Epicritic sensation to light touch grossly present bilaterally.  Dermatologic Painful ingrowing nail at medial nail borders of the hallux nail bilaterally. No  other open wounds. No skin lesions.  Orthopedic: Normal joint ROM without pain or crepitus bilaterally. No visible deformities. No bony tenderness.   Radiographs: None Assessment:   1. Ingrown toenail of right foot   2. Ingrown left big toenail    Plan:  Patient was evaluated and treated and all questions answered.  Ingrown Nail, bilaterally -Patient elects to proceed with minor surgery to remove ingrown toenail removal today. Consent reviewed and signed by patient. -Ingrown nail excised. See procedure note. -Educated on post-procedure care including soaking. Written instructions provided and reviewed. -Patient to follow up in 2 weeks for nail check.  Procedure: Excision of Ingrown Toenail Location: Bilateral 1st toe medial nail borders. Anesthesia: Lidocaine 1% plain; 1.5 mL and Marcaine 0.5% plain; 1.5 mL, digital block. Skin Prep: Betadine. Dressing: Silvadene; telfa; dry, sterile, compression dressing. Technique: Following skin prep, the toe was exsanguinated and a tourniquet was secured at the base of the toe. The affected nail border was freed, split with a nail splitter, and excised. Chemical matrixectomy was then performed with phenol and irrigated out with alcohol. The tourniquet was then removed and sterile dressing applied. Disposition: Patient tolerated procedure well. Patient to return in 2 weeks for follow-up.   No follow-ups on file.

## 2023-03-22 ENCOUNTER — Ambulatory Visit: Payer: Federal, State, Local not specified - PPO | Admitting: Adult Health

## 2023-03-28 ENCOUNTER — Ambulatory Visit

## 2023-03-28 ENCOUNTER — Ambulatory Visit: Payer: Federal, State, Local not specified - PPO | Admitting: Nurse Practitioner

## 2023-04-12 NOTE — Telephone Encounter (Signed)
 Spoke with [facility]. They will give me a callback on Monday after they are done investigating the issue.

## 2023-04-23 ENCOUNTER — Telehealth: Payer: Self-pay

## 2023-04-23 DIAGNOSIS — E119 Type 2 diabetes mellitus without complications: Secondary | ICD-10-CM | POA: Diagnosis not present

## 2023-04-23 LAB — HM DIABETES EYE EXAM

## 2023-04-23 NOTE — Telephone Encounter (Signed)
 Copied from CRM [id number]. Topic: General - Other >> Apr 22, 2023  1:06 PM [name] B wrote: Reason for CRM: Patient has been waiting on referral to get her CPAP replacement.  Patient called [facility] they stated they don't have the order. Patient called her insurance and was told there is nothing that would hold a new machine. No pre auth needed.    ATC x1 was unable to leave a VM on patient's phone Voicemail not set up . Will send patient a mychart message .

## 2023-04-23 NOTE — Telephone Encounter (Signed)
 Called [facility]. Was sent to an Automated call with [employer]. Provided Pt's info and the reason why I'm calling. I should be getting a callback tomorrow.

## 2023-04-23 NOTE — Telephone Encounter (Unsigned)
 Copied from CRM [id number]. Topic: General - Other >> Apr 22, 2023  1:06 PM Eveleen Hinds B wrote: Reason for CRM: Patient has been waiting on referral to get her CPAP replacement.  Patient called [facility] they stated they don't have the order. Patient called her insurance and was told there is nothing that would hold a new machine. No pre auth needed.

## 2023-04-24 NOTE — Telephone Encounter (Signed)
 I was informed by Polly Brink with [facility] that it is now being processed by insurance and [facility] will give the patient a call when its done. Nothing further needed at this time.

## 2023-04-24 NOTE — Telephone Encounter (Signed)
 Spoke with [employer]. Was informed that the order needed to be signed on parachute before it can be processed. I went to [facility] and was informed that it signed this morning.

## 2023-04-30 ENCOUNTER — Other Ambulatory Visit (INDEPENDENT_AMBULATORY_CARE_PROVIDER_SITE_OTHER)

## 2023-04-30 DIAGNOSIS — E785 Hyperlipidemia, unspecified: Secondary | ICD-10-CM | POA: Diagnosis not present

## 2023-04-30 LAB — LIPID PANEL
Cholesterol: 139 mg/dL (ref 0–200)
HDL: 35.4 mg/dL — ABNORMAL LOW (ref >39.00)
LDL Cholesterol: 82 mg/dL (ref 0–99)
NonHDL: 103.92
Total CHOL/HDL Ratio: 4
Triglycerides: 111 mg/dL (ref 0.0–149.0)
VLDL: 22.2 mg/dL (ref 0.0–40.0)

## 2023-04-30 LAB — COMPREHENSIVE METABOLIC PANEL WITH GFR
ALT: 16 U/L (ref 0–35)
AST: 14 U/L (ref 0–37)
Albumin: 3.8 g/dL (ref 3.5–5.2)
Alkaline Phosphatase: 61 U/L (ref 39–117)
BUN: 11 mg/dL (ref 6–23)
CO2: 27 meq/L (ref 19–32)
Calcium: 9.2 mg/dL (ref 8.4–10.5)
Chloride: 104 meq/L (ref 96–112)
Creatinine, Ser: 1 mg/dL (ref 0.40–1.20)
GFR: 68.95 mL/min (ref >60.00)
Glucose, Bld: 84 mg/dL (ref 70–99)
Potassium: 4 meq/L (ref 3.5–5.1)
Sodium: 138 meq/L (ref 135–145)
Total Bilirubin: 0.4 mg/dL (ref 0.2–1.2)
Total Protein: 6.7 g/dL (ref 6.0–8.3)

## 2023-05-03 ENCOUNTER — Ambulatory Visit: Admitting: Nurse Practitioner

## 2023-05-03 VITALS — BP 114/72 | HR 77 | Temp 98.4°F | Ht 62.0 in | Wt 258.5 lb

## 2023-05-03 DIAGNOSIS — E782 Mixed hyperlipidemia: Secondary | ICD-10-CM | POA: Diagnosis not present

## 2023-05-03 DIAGNOSIS — E119 Type 2 diabetes mellitus without complications: Secondary | ICD-10-CM

## 2023-05-03 DIAGNOSIS — F411 Generalized anxiety disorder: Secondary | ICD-10-CM | POA: Diagnosis not present

## 2023-05-03 DIAGNOSIS — F332 Major depressive disorder, recurrent severe without psychotic features: Secondary | ICD-10-CM

## 2023-05-03 DIAGNOSIS — Z7985 Long-term (current) use of injectable non-insulin antidiabetic drugs: Secondary | ICD-10-CM

## 2023-05-03 MED ORDER — BLOOD GLUCOSE TEST VI STRP: 1.0000 | ORAL_STRIP | Freq: Three times a day (TID) | 6 refills | Status: AC

## 2023-05-03 MED ORDER — SEMAGLUTIDE (2 MG/DOSE) 8 MG/3ML ~~LOC~~ SOPN: 2.0000 mg | PEN_INJECTOR | SUBCUTANEOUS | 6 refills | Status: DC

## 2023-05-03 MED ORDER — LANCETS MISC. MISC: 1.0000 | Freq: Three times a day (TID) | 0 refills | Status: AC

## 2023-05-03 MED ORDER — FLUOXETINE HCL 20 MG PO CAPS: 60.0000 mg | ORAL_CAPSULE | Freq: Every day | ORAL | 1 refills | Status: DC

## 2023-05-03 MED ORDER — BLOOD GLUCOSE MONITORING SUPPL DEVI: 1.0000 | Freq: Three times a day (TID) | 0 refills | Status: AC

## 2023-05-03 MED ORDER — LANCET DEVICE MISC: 1.0000 | Freq: Three times a day (TID) | 0 refills | Status: AC

## 2023-05-03 NOTE — Progress Notes (Signed)
 Established Patient Office Visit  Subjective   Patient ID: [name], female    DOB: [birth year]  Age: 43 y.o. MRN: [id number]  Chief Complaint  Patient presents with   Hyperlipidemia   T2DM/HLD: Last A1c 5.7, last LDL 82.  Recently increase atorvastatin  from 10 mg daily to 20 mg daily, patient is tolerating increase well and denies myalgias.  Also continues on metformin  500 mg daily with her first meal and Ozempic  1 mg weekly injection.  Depression and anxiety: Chronically on BuSpar  and fluoxetine .  Reports mood is stable, denies any suicidal homicidal ideation.  Would like refill on fluoxetine .     ROS: see HPI    Objective:     BP 114/72   Pulse 77   Temp 98.4 F (36.9 C) (Temporal)   Ht 5\' 2"  (1.575 m)   Wt 258 lb 8 oz (117.3 kg)   LMP 04/15/2023   BMI 47.28 kg/m  BP Readings from Last 3 Encounters:  05/03/23 114/72  03/11/23 138/80  12/20/22 132/86   Wt Readings from Last 3 Encounters:  05/03/23 258 lb 8 oz (117.3 kg)  03/11/23 257 lb 9.6 oz (116.8 kg)  12/20/22 264 lb 6.4 oz (119.9 kg)      Physical Exam Constitutional:      Appearance: Normal appearance. She is obese.  Cardiovascular:     Rate and Rhythm: Normal rate.  Pulmonary:     Effort: Pulmonary effort is normal.  Neurological:     General: No focal deficit present.     Mental Status: She is alert.  Psychiatric:        Mood and Affect: Mood normal.        Behavior: Behavior normal.        Thought Content: Thought content normal.        Judgment: Judgment normal.      No results found for any visits on 05/03/23.    The 10-year ASCVD risk score (Arnett DK, et al., 2019) is: 1.2%    Assessment & Plan:   Problem List Items Addressed This Visit       Endocrine   Diabetes mellitus without complication (HCC) - Primary   Chronic, stable Last A1c at goal Per shared decision making will increase Ozempic  to 2 mg/week Patient does not have glucometer at home, will send  prescription so she has 1 to monitor her fasting blood sugars and/or check blood sugar if she has signs of hypo or hyperglycemia.  We discussed the signs as well so she will know when to check. For now patient would like to continue atorvastatin  20 mg daily, will check fasting lipid panel at annual physical in about 6 months.  If LDL greater than 70 may consider increasing dose of atorvastatin  further.      Relevant Medications   Blood Glucose Monitoring Suppl DEVI   Glucose Blood (BLOOD GLUCOSE TEST STRIPS) STRP   Lancet Device MISC   Lancets Misc. MISC   Semaglutide , 2 MG/DOSE, 8 MG/3ML SOPN     Other   Depression   Chronic, controlled, stable Continue buspirone  10 mg 3 times daily and fluoxetine  60 mg daily.  Prescription for fluoxetine  sent to pharmacy per patient request.      Relevant Medications   FLUoxetine  (PROZAC ) 20 MG capsule   GAD (generalized anxiety disorder)   Chronic, controlled, stable Continue buspirone  10 mg 3 times daily and fluoxetine  60 mg daily.  Prescription for fluoxetine  sent to pharmacy per patient request.  Relevant Medications   FLUoxetine  (PROZAC ) 20 MG capsule   Hyperlipidemia   Chronic, stable Patient tolerating atorvastatin  20 mg daily well.  LDL still slightly above goal.  Patient would like to continue on this dose for now and may consider upping it if next fasting lipid panel identifies high LDL.  Consider checking next fasting lipid panel at annual physical in 6 months.       Return in about 6 months (around 11/02/2023) for CPE with Isa Manuel.    Zorita Hiss, NP

## 2023-05-03 NOTE — Assessment & Plan Note (Signed)
 Chronic, controlled, stable Continue buspirone  10 mg 3 times daily and fluoxetine  60 mg daily.  Prescription for fluoxetine  sent to pharmacy per patient request.

## 2023-05-03 NOTE — Assessment & Plan Note (Signed)
 Chronic, stable Last A1c at goal Per shared decision making will increase Ozempic  to 2 mg/week Patient does not have glucometer at home, will send prescription so she has 1 to monitor her fasting blood sugars and/or check blood sugar if she has signs of hypo or hyperglycemia.  We discussed the signs as well so she will know when to check. For now patient would like to continue atorvastatin  20 mg daily, will check fasting lipid panel at annual physical in about 6 months.  If LDL greater than 70 may consider increasing dose of atorvastatin  further.

## 2023-05-03 NOTE — Assessment & Plan Note (Signed)
 Chronic, stable Patient tolerating atorvastatin  20 mg daily well.  LDL still slightly above goal.  Patient would like to continue on this dose for now and may consider upping it if next fasting lipid panel identifies high LDL.  Consider checking next fasting lipid panel at annual physical in 6 months.

## 2023-05-13 DIAGNOSIS — G4733 Obstructive sleep apnea (adult) (pediatric): Secondary | ICD-10-CM | POA: Diagnosis not present

## 2023-05-13 DIAGNOSIS — I10 Essential (primary) hypertension: Secondary | ICD-10-CM | POA: Diagnosis not present

## 2023-05-23 DIAGNOSIS — G4733 Obstructive sleep apnea (adult) (pediatric): Secondary | ICD-10-CM | POA: Diagnosis not present

## 2023-05-23 DIAGNOSIS — I10 Essential (primary) hypertension: Secondary | ICD-10-CM | POA: Diagnosis not present

## 2023-06-01 ENCOUNTER — Other Ambulatory Visit: Payer: Self-pay | Admitting: Nurse Practitioner

## 2023-06-01 DIAGNOSIS — E119 Type 2 diabetes mellitus without complications: Secondary | ICD-10-CM

## 2023-06-23 DIAGNOSIS — I10 Essential (primary) hypertension: Secondary | ICD-10-CM | POA: Diagnosis not present

## 2023-06-23 DIAGNOSIS — G4733 Obstructive sleep apnea (adult) (pediatric): Secondary | ICD-10-CM | POA: Diagnosis not present

## 2023-07-23 DIAGNOSIS — G4733 Obstructive sleep apnea (adult) (pediatric): Secondary | ICD-10-CM | POA: Diagnosis not present

## 2023-07-23 DIAGNOSIS — I10 Essential (primary) hypertension: Secondary | ICD-10-CM | POA: Diagnosis not present

## 2023-07-26 ENCOUNTER — Other Ambulatory Visit: Payer: Self-pay | Admitting: General Surgery

## 2023-07-26 DIAGNOSIS — N649 Disorder of breast, unspecified: Secondary | ICD-10-CM

## 2023-08-23 DIAGNOSIS — I10 Essential (primary) hypertension: Secondary | ICD-10-CM | POA: Diagnosis not present

## 2023-08-23 DIAGNOSIS — G4733 Obstructive sleep apnea (adult) (pediatric): Secondary | ICD-10-CM | POA: Diagnosis not present

## 2023-08-25 ENCOUNTER — Other Ambulatory Visit: Payer: Self-pay | Admitting: Nurse Practitioner

## 2023-08-25 DIAGNOSIS — K5900 Constipation, unspecified: Secondary | ICD-10-CM

## 2023-09-03 ENCOUNTER — Ambulatory Visit
Admission: RE | Admit: 2023-09-03 | Discharge: 2023-09-03 | Disposition: A | Discharge status: Home / Self Care | Source: Ambulatory Visit | Attending: General Surgery | Admitting: General Surgery

## 2023-09-03 DIAGNOSIS — N6489 Other specified disorders of breast: Secondary | ICD-10-CM | POA: Diagnosis not present

## 2023-09-03 DIAGNOSIS — N649 Disorder of breast, unspecified: Secondary | ICD-10-CM

## 2023-09-03 DIAGNOSIS — R928 Other abnormal and inconclusive findings on diagnostic imaging of breast: Secondary | ICD-10-CM | POA: Diagnosis not present

## 2023-09-26 ENCOUNTER — Other Ambulatory Visit: Payer: Self-pay | Admitting: Nurse Practitioner

## 2023-09-26 DIAGNOSIS — R7303 Prediabetes: Secondary | ICD-10-CM

## 2023-10-15 ENCOUNTER — Telehealth: Payer: Self-pay | Admitting: Pharmacy Technician

## 2023-10-15 ENCOUNTER — Other Ambulatory Visit (HOSPITAL_COMMUNITY): Payer: Self-pay

## 2023-10-15 NOTE — Telephone Encounter (Signed)
 Pharmacy Patient Advocate Encounter   Received notification from CoverMyMeds that prior authorization for Ozempic  (2 MG/DOSE) 8MG /3ML pen-injectors  is required/requested.   Insurance verification completed.   The patient is insured through [facility].   Per test claim: PA required and submitted KEY/EOC/Request #: [id number]  APPROVED from 10/15/23 to 10/14/24. Unable to obtain price due to refill too soon rejection, last fill date 09/16/23 next available fill date 10/27/23

## 2023-10-23 ENCOUNTER — Other Ambulatory Visit: Payer: Self-pay | Admitting: Nurse Practitioner

## 2023-10-23 DIAGNOSIS — F411 Generalized anxiety disorder: Secondary | ICD-10-CM

## 2023-10-23 DIAGNOSIS — F332 Major depressive disorder, recurrent severe without psychotic features: Secondary | ICD-10-CM

## 2023-10-23 DIAGNOSIS — I10 Essential (primary) hypertension: Secondary | ICD-10-CM | POA: Diagnosis not present

## 2023-10-23 DIAGNOSIS — G4733 Obstructive sleep apnea (adult) (pediatric): Secondary | ICD-10-CM | POA: Diagnosis not present

## 2023-11-07 ENCOUNTER — Encounter: Admitting: Nurse Practitioner

## 2023-11-20 ENCOUNTER — Other Ambulatory Visit: Payer: Self-pay | Admitting: Nurse Practitioner

## 2023-11-20 ENCOUNTER — Encounter: Payer: Self-pay | Admitting: Nurse Practitioner

## 2023-11-20 DIAGNOSIS — Z0000 Encounter for general adult medical examination without abnormal findings: Secondary | ICD-10-CM

## 2023-11-20 DIAGNOSIS — Z6841 Body Mass Index (BMI) 40.0 and over, adult: Secondary | ICD-10-CM

## 2023-11-20 DIAGNOSIS — E559 Vitamin D deficiency, unspecified: Secondary | ICD-10-CM

## 2023-11-20 DIAGNOSIS — I10 Essential (primary) hypertension: Secondary | ICD-10-CM

## 2023-11-20 DIAGNOSIS — E119 Type 2 diabetes mellitus without complications: Secondary | ICD-10-CM

## 2023-11-20 DIAGNOSIS — E782 Mixed hyperlipidemia: Secondary | ICD-10-CM

## 2023-11-21 ENCOUNTER — Other Ambulatory Visit: Payer: Self-pay | Admitting: Medical Genetics

## 2023-11-21 ENCOUNTER — Encounter: Payer: Self-pay | Admitting: Adult Health

## 2023-11-26 ENCOUNTER — Other Ambulatory Visit (INDEPENDENT_AMBULATORY_CARE_PROVIDER_SITE_OTHER)

## 2023-11-26 DIAGNOSIS — E66813 Obesity, class 3: Secondary | ICD-10-CM

## 2023-11-26 DIAGNOSIS — Z6841 Body Mass Index (BMI) 40.0 and over, adult: Secondary | ICD-10-CM

## 2023-11-26 DIAGNOSIS — E782 Mixed hyperlipidemia: Secondary | ICD-10-CM

## 2023-11-26 DIAGNOSIS — I10 Essential (primary) hypertension: Secondary | ICD-10-CM | POA: Diagnosis not present

## 2023-11-26 DIAGNOSIS — Z0000 Encounter for general adult medical examination without abnormal findings: Secondary | ICD-10-CM

## 2023-11-26 DIAGNOSIS — E119 Type 2 diabetes mellitus without complications: Secondary | ICD-10-CM

## 2023-11-26 DIAGNOSIS — E559 Vitamin D deficiency, unspecified: Secondary | ICD-10-CM

## 2023-11-26 LAB — LIPID PANEL
Cholesterol: 130 mg/dL (ref 0–200)
HDL: 36.9 mg/dL — ABNORMAL LOW (ref >39.00)
LDL Cholesterol: 73 mg/dL (ref 0–99)
NonHDL: 92.65
Total CHOL/HDL Ratio: 4
Triglycerides: 100 mg/dL (ref 0.0–149.0)
VLDL: 20 mg/dL (ref 0.0–40.0)

## 2023-11-26 LAB — CBC
HCT: 39.2 % (ref 36.0–46.0)
Hemoglobin: 13.2 g/dL (ref 12.0–15.0)
MCHC: 33.8 g/dL (ref 30.0–36.0)
MCV: 87.8 fl (ref 78.0–100.0)
Platelets: 228 K/uL (ref 150.0–400.0)
RBC: 4.47 Mil/uL (ref 3.87–5.11)
RDW: 13.4 % (ref 11.5–15.5)
WBC: 6 K/uL (ref 4.0–10.5)

## 2023-11-26 LAB — COMPREHENSIVE METABOLIC PANEL WITH GFR
ALT: 17 U/L (ref 0–35)
AST: 13 U/L (ref 0–37)
Albumin: 3.7 g/dL (ref 3.5–5.2)
Alkaline Phosphatase: 60 U/L (ref 39–117)
BUN: 17 mg/dL (ref 6–23)
CO2: 28 meq/L (ref 19–32)
Calcium: 8.9 mg/dL (ref 8.4–10.5)
Chloride: 105 meq/L (ref 96–112)
Creatinine, Ser: 0.92 mg/dL (ref 0.40–1.20)
GFR: 75.89 mL/min (ref >60.00)
Glucose, Bld: 83 mg/dL (ref 70–99)
Potassium: 4.1 meq/L (ref 3.5–5.1)
Sodium: 139 meq/L (ref 135–145)
Total Bilirubin: 0.4 mg/dL (ref 0.2–1.2)
Total Protein: 6.2 g/dL (ref 6.0–8.3)

## 2023-11-26 LAB — VITAMIN D 25 HYDROXY (VIT D DEFICIENCY, FRACTURES): VITD: 39.36 ng/mL (ref 30.00–100.00)

## 2023-11-26 LAB — HEMOGLOBIN A1C: Hgb A1c MFr Bld: 5.4 % (ref 4.6–6.5)

## 2023-11-26 LAB — TSH: TSH: 2.45 u[IU]/mL (ref 0.35–5.50)

## 2023-11-28 ENCOUNTER — Ambulatory Visit (INDEPENDENT_AMBULATORY_CARE_PROVIDER_SITE_OTHER): Admitting: Nurse Practitioner

## 2023-11-28 VITALS — BP 134/80 | HR 76 | Temp 98.0°F | Ht 62.0 in | Wt 232.1 lb

## 2023-11-28 DIAGNOSIS — I10 Essential (primary) hypertension: Secondary | ICD-10-CM

## 2023-11-28 DIAGNOSIS — Z0000 Encounter for general adult medical examination without abnormal findings: Secondary | ICD-10-CM | POA: Diagnosis not present

## 2023-11-28 DIAGNOSIS — E66813 Obesity, class 3: Secondary | ICD-10-CM

## 2023-11-28 DIAGNOSIS — Z6841 Body Mass Index (BMI) 40.0 and over, adult: Secondary | ICD-10-CM

## 2023-11-28 DIAGNOSIS — E119 Type 2 diabetes mellitus without complications: Secondary | ICD-10-CM

## 2023-11-28 DIAGNOSIS — Z23 Encounter for immunization: Secondary | ICD-10-CM | POA: Diagnosis not present

## 2023-11-28 DIAGNOSIS — F411 Generalized anxiety disorder: Secondary | ICD-10-CM

## 2023-11-28 DIAGNOSIS — G4700 Insomnia, unspecified: Secondary | ICD-10-CM | POA: Insufficient documentation

## 2023-11-28 DIAGNOSIS — E782 Mixed hyperlipidemia: Secondary | ICD-10-CM

## 2023-11-28 MED ORDER — BUSPIRONE HCL 10 MG PO TABS: 10.0000 mg | ORAL_TABLET | Freq: Three times a day (TID) | ORAL | 3 refills | Status: AC

## 2023-11-28 NOTE — Assessment & Plan Note (Signed)
 Generalized anxiety disorder, stable Stable on Buspar . No mood changes or suicidal thoughts. - Continue Buspar  10 mg oral BID to TID. Depression, stable Stable on Fluoxetine . No mood changes or suicidal thoughts. - Continue Fluoxetine  60 mg oral daily.

## 2023-11-28 NOTE — Assessment & Plan Note (Signed)
 Mixed hyperlipidemia LDL controlled at 73. Managed with Atorvastatin . - Continue Atorvastatin  20 mg oral daily.

## 2023-11-28 NOTE — Assessment & Plan Note (Signed)
  Essential hypertension, diet controlled Blood pressure reasonable. Managed with diet control. - Continue diet control for hypertension.

## 2023-11-28 NOTE — Assessment & Plan Note (Signed)
 Annual physical exam and immunization Pap smear and mammogram overdue. Flu shot due. - Administered flu shot today, VIS provided - Schedule Pap smear and mammogram with OB GYN. - Discussed health maintenance, handout provided.

## 2023-11-28 NOTE — Assessment & Plan Note (Signed)
 Class 3 obesity with significant weight loss BMI 42.4. Lost 74 pounds over a year and a half due to Ozempic  and lifestyle changes. - Continue current treatment regimen

## 2023-11-28 NOTE — Assessment & Plan Note (Addendum)
 A1c at 5.4. Managed with Metformin  and Ozempic . - Continue Metformin  500 mg oral daily. - Continue Ozempic  2 mg injection weekly. - Continue statin

## 2023-11-28 NOTE — Addendum Note (Signed)
 Addended by: LEAR, Moe Brier P on: 11/28/2023 10:21 AM   Modules accepted: Orders

## 2023-11-28 NOTE — Progress Notes (Signed)
 Complete physical exam  Patient: [name]   DOB: [birth year]   43 y.o. Female  MRN: [id number]  Subjective:    Chief Complaint  Patient presents with   Annual Exam    [name] is a 42 y.o. female who presents today for a complete physical exam.   Discussed the use of AI scribe software for clinical note transcription with the patient, who gave verbal consent to proceed.  History of Present Illness [name] is a 43 year old female who presents for her annual physical exam and to discuss her chronic medical conditions.  Metabolic syndrome and weight management - Hypertension is diet-controlled without medication. - Type 2 diabetes is managed with metformin  500 mg daily and Ozempic  2 mg weekly. - Last hemoglobin A1c was 5.4%. - Weight loss of 74 pounds over the past year and a half. - Current BMI is 42.4. - Hyperlipidemia is treated with atorvastatin  20 mg daily. - Last LDL was 73 mg/dL. - Physical, emotional, and mental well-being have improved with weight loss.  Anxiety and psychosocial stressors - Generalized anxiety disorder is managed with Buspar  10 mg BID to TID and Prozac  60 mg daily. - Experiencing stress related to recent government shutdowns affecting her [employer] job and the process of moving into a new house. - No chest pain, heart palpitations, sudden mood changes, or suicidal ideation.  Preventive health maintenance - Plans to complete Pap smear and mammogram with OB GYN. - Requests influenza vaccination today.    Most recent fall risk assessment:    11/28/2023    8:52 AM  Fall Risk   Falls in the past year? 0  Number falls in past yr: 0  Injury with Fall? 0  Risk for fall due to : No Fall Risks  Follow up Falls evaluation completed     Most recent depression screenings:    11/28/2023    8:52 AM 05/03/2023    8:23 AM  PHQ 2/9 Scores  PHQ - 2 Score 0 0  PHQ- 9 Score 0       Past Medical History:  Diagnosis Date   Anxiety     Arthritis    Back pain    Bilateral swelling of feet    Chest pain    Cluster headaches    Depression    Eczema    GERD (gastroesophageal reflux disease)    Gestational diabetes 2012, 2016   Hyperlipidemia    Hypertension    no meds currently   Joint pain    Obesity    Osteoarthritis    PCOS (polycystic ovarian syndrome)    Pre-diabetes    Sciatica    Shortness of breath    Sleep apnea    CPAP   Snoring    sleep study 02/2012 with min AHI events   Vitamin D  deficiency    Past Surgical History:  Procedure Laterality Date   CESAREAN SECTION  09/09/2010   x's 2, 2009   CESAREAN SECTION WITH BILATERAL TUBAL LIGATION Bilateral 12/18/2014   Procedure: REPEAT CESAREAN SECTION WITH BILATERAL TUBAL LIGATION;  Surgeon: Ovid All, MD;  Location: [facility];  Service: Obstetrics;  Laterality: Bilateral;   COLPOSCOPY     DENTAL SURGERY     Ganglion removal from (L) wrist Left 01/09/1996   LAPAROSCOPIC UNILATERAL SALPINGECTOMY Left 01/26/2015   Procedure: LAPAROSCOPIC UNILATERAL SALPINGECTOMY;  Surgeon: Ovid All, MD;  Location: [facility];  Service: Gynecology;  Laterality: Left;   WISDOM TOOTH EXTRACTION  Social History   Tobacco Use   Smoking status: Every Day    Current packs/day: 0.50    Average packs/day: 0.5 packs/day for 15.0 years (7.5 ttl pk-yrs)    Types: Cigarettes   Smokeless tobacco: Never   Tobacco comments:    Referred to 1-800-quitline. Patients is smoking 5 cigarettes a day  Vaping Use   Vaping status: Never Used  Substance Use Topics   Alcohol use: Yes    Comment: rarely   Drug use: No   Family History  Problem Relation Age of Onset   Arthritis Mother    Alcohol abuse Mother    Mental illness Mother    Cancer Mother 77       squamous - unknown primary   Coronary artery disease Mother 62       MI with stent   Anxiety disorder Mother    Depression Mother    Diabetes Mother    Obesity Mother    Arthritis Father    Alcohol abuse Father     Hyperlipidemia Father    Heart disease Father    Kidney disease Father    Stroke Father    Mental illness Father    Drug abuse Father    Hypertension Father    Depression Father    Alcoholism Father    Colon polyps Father    Anxiety disorder Sister    Diabetes Maternal Grandmother    Heart disease Maternal Grandmother    Stroke Maternal Grandfather    Hypertension Paternal Grandfather    Hyperlipidemia Paternal Grandfather    Breast cancer Paternal Aunt    Arthritis Other    Alcohol abuse Other    Allergies  Allergen Reactions   Codeine     STOMACH CRAMPING   Doxycycline Nausea And Vomiting      Patient Care Team: Elnor [name] BRAVO, NP as PCP - General (Nurse Practitioner) Loni Soyla LABOR, MD as PCP - Cardiology (Cardiology) Armond Cape, MD (Obstetrics and Gynecology) Gaspar Kung, MD (Orthopedic Surgery) Clance, Francis HERO, MD (Pulmonary Disease) Sheffield, Kelli R, PA-C (Inactive) as Physician Assistant (Dermatology)   Outpatient Medications Prior to Visit  Medication Sig   atorvastatin  (LIPITOR) 20 MG tablet Take 1 tablet (20 mg total) by mouth daily.   Biotin 5000 MCG CAPS Take 1 capsule by mouth.   Blood Glucose Monitoring Suppl DEVI 1 each by Does not apply route in the morning, at noon, and at bedtime. May substitute to any manufacturer covered by patient's insurance.   celecoxib  (CELEBREX ) 100 MG capsule TAKE 1 CAPSULE BY MOUTH TWICE A DAY   cetirizine  (ZYRTEC ) 10 MG tablet TAKE 1 TABLET BY MOUTH EVERY DAY   Cholecalciferol  (VITAMIN D3) 50 MCG (2000 UT) TABS TAKE 1 TABLET BY MOUTH EVERY DAY   docusate sodium  (COLACE) 100 MG capsule TAKE 1 CAPSULE BY MOUTH EVERY DAY   FLUoxetine  (PROZAC ) 20 MG capsule TAKE 3 CAPSULES BY MOUTH EVERY DAY   Glucose Blood (BLOOD GLUCOSE TEST STRIPS) STRP 1 each by In Vitro route in the morning, at noon, and at bedtime. May substitute to any manufacturer covered by patient's insurance.   JUNEL 1/20 1-20 MG-MCG tablet Take 1 tablet by  mouth daily.   metFORMIN  (GLUCOPHAGE ) 500 MG tablet Take 1 tablet (500 mg total) by mouth daily with breakfast. TAKE 1/2 TABLET BY MOUTH DAILY WITH FIRST MEAL FOR 2 WEEKS THEN 1 TABLET DAILY WITH FIRST MEAL   omeprazole  (PRILOSEC) 40 MG capsule TAKE 1 CAPSULE BY MOUTH DAILY  30 MINUTES BEFORE BREAKFAST   polyethylene glycol powder ([facility] PURELAX) 17 GM/SCOOP powder DISSOLVE 17 GRAMS INTO WATER AND DRINK BY MOUTH EVERY DAY   Semaglutide , 2 MG/DOSE, 8 MG/3ML SOPN Inject 2 mg as directed once a week.   [DISCONTINUED] busPIRone  (BUSPAR ) 10 MG tablet Take 1 tablet (10 mg total) by mouth 3 (three) times daily. For mood control   [DISCONTINUED] ivabradine  (CORLANOR) 5 MG TABS tablet Take 10 mg by mouth once.   [DISCONTINUED] metoprolol  tartrate (LOPRESSOR ) 50 MG tablet Take 1 tablet (50 mg total) by mouth once for 1 dose. Take 2 hours before your Coronary CT test   No facility-administered medications prior to visit.    Review of Systems  Constitutional:  Positive for weight loss (intentional). Negative for fever.  Respiratory:  Negative for cough and shortness of breath.   Cardiovascular:  Negative for chest pain and palpitations.  Gastrointestinal:  Negative for abdominal pain and blood in stool.  Genitourinary:  Negative for dysuria and hematuria.  Psychiatric/Behavioral:  Negative for depression and suicidal ideas. The patient does not have insomnia.           Objective:     BP 134/80   Pulse 76   Temp 98 F (36.7 C) (Temporal)   Ht 5' 2 (1.575 m)   Wt 232 lb 2 oz (105.3 kg)   SpO2 97%   BMI 42.46 kg/m  BP Readings from Last 3 Encounters:  11/28/23 134/80  05/03/23 114/72  03/11/23 138/80   Wt Readings from Last 3 Encounters:  11/28/23 232 lb 2 oz (105.3 kg)  05/03/23 258 lb 8 oz (117.3 kg)  03/11/23 257 lb 9.6 oz (116.8 kg)      Physical Exam Vitals reviewed.  Constitutional:      Appearance: Normal appearance.  HENT:     Head: Normocephalic and atraumatic.      Right Ear: Tympanic membrane, ear canal and external ear normal.     Left Ear: Tympanic membrane, ear canal and external ear normal.  Eyes:     General:        Right eye: No discharge.        Left eye: No discharge.     Extraocular Movements: Extraocular movements intact.     Conjunctiva/sclera: Conjunctivae normal.     Pupils: Pupils are equal, round, and reactive to light.  Neck:     Vascular: No carotid bruit.  Cardiovascular:     Rate and Rhythm: Normal rate and regular rhythm.     Pulses: Normal pulses.     Heart sounds: Normal heart sounds. No murmur heard. Pulmonary:     Effort: Pulmonary effort is normal.     Breath sounds: Normal breath sounds.  Chest:     Comments: Breast exam deferred per patient preference Abdominal:     General: Abdomen is flat. Bowel sounds are normal. There is no distension.     Palpations: Abdomen is soft. There is no mass.     Tenderness: There is no abdominal tenderness.  Musculoskeletal:        General: No tenderness.     Cervical back: Neck supple. No muscular tenderness.     Right lower leg: No edema.     Left lower leg: No edema.  Lymphadenopathy:     Cervical: No cervical adenopathy.     Upper Body:     Right upper body: No supraclavicular adenopathy.     Left upper body: No supraclavicular adenopathy.  Skin:  General: Skin is warm and dry.  Neurological:     General: No focal deficit present.     Mental Status: She is alert and oriented to person, place, and time.     Motor: No weakness.     Gait: Gait normal.  Psychiatric:        Mood and Affect: Mood normal.        Behavior: Behavior normal.        Judgment: Judgment normal.      No results found for any visits on 11/28/23.     Assessment & Plan:    Routine Health Maintenance and Physical Exam  Immunization History  Administered Date(s) Administered   Influenza Whole 10/30/2011   Influenza, Seasonal, Injecte, Preservative Fre 09/28/2014, 12/20/2022    Influenza,inj,Quad PF,6+ Mos 10/21/2012, 10/03/2017, 09/22/2020   Influenza-Unspecified 10/08/2013, 11/02/2015   Moderna Sars-Covid-2 Vaccination 05/28/2019, 06/25/2019   Td 01/08/2005   Tdap 09/28/2014    Health Maintenance  Topic Date Due   Diabetic kidney evaluation - Urine ACR  Never done   Hepatitis B Vaccines 19-59 Average Risk (1 of 3 - 19+ 3-dose series) Never done   HPV VACCINES (1 - Risk 3-dose SCDM series) Never done   Cervical Cancer Screening (HPV/Pap Cotest)  02/13/2015   COVID-19 Vaccine (3 - Moderna risk series) 07/23/2019   Mammogram  09/16/2019   Influenza Vaccine  08/09/2023   DTaP/Tdap/Td (3 - Td or Tdap) 09/27/2024   Diabetic kidney evaluation - eGFR measurement  11/25/2024   Hepatitis C Screening  Completed   HIV Screening  Completed   Meningococcal B Vaccine  Aged Out    Discussed health benefits of physical activity, and encouraged her to engage in regular exercise appropriate for her age and condition.  Problem List Items Addressed This Visit       Cardiovascular and Mediastinum   Hypertensive disorder - Primary     Endocrine   Diabetes mellitus without complication (HCC)     Other   Class 3 severe obesity with serious comorbidity and body mass index (BMI) of 50.0 to 59.9 in adult [facility])   GAD (generalized anxiety disorder)   Relevant Medications   busPIRone  (BUSPAR ) 10 MG tablet   Hyperlipidemia   Needs flu shot   Other Visit Diagnoses       Insomnia, unspecified type       Relevant Medications   busPIRone  (BUSPAR ) 10 MG tablet      Assessment and Plan Assessment & Plan Annual physical exam and immunization Pap smear and mammogram overdue. Flu shot due. - Administered flu shot today, VIS provided - Schedule Pap smear and mammogram with OB GYN. - Discussed health maintenance, handout provided.   Type 2 diabetes mellitus, well controlled A1c at 5.4. Managed with Metformin  and Ozempic . - Continue Metformin  500 mg oral daily. -  Continue Ozempic  2 mg injection weekly. - Continue statin   Class 3 obesity with significant weight loss BMI 42.4. Lost 74 pounds over a year and a half due to Ozempic  and lifestyle changes.   Mixed hyperlipidemia LDL controlled at 73. Managed with Atorvastatin . - Continue Atorvastatin  20 mg oral daily.  Essential hypertension, diet controlled Blood pressure reasonable. Managed with diet control. - Continue diet control for hypertension.  Generalized anxiety disorder, stable Stable on Buspar . No mood changes or suicidal thoughts. - Continue Buspar  10 mg oral BID to TID.  Depression, stable Stable on Fluoxetine . No mood changes or suicidal thoughts. - Continue Fluoxetine  60 mg oral daily.  In additino to annual exam I also completed an office visit as detailed above  Return in about 1 year (around 11/27/2024) for CPE with Ladislav Caselli.     [name] FORBES Pereyra, NP

## 2023-11-28 NOTE — Assessment & Plan Note (Signed)
 Flu shot administered, VIS provided.

## 2023-11-29 ENCOUNTER — Other Ambulatory Visit: Payer: Self-pay | Admitting: Nurse Practitioner

## 2023-11-29 DIAGNOSIS — M1990 Unspecified osteoarthritis, unspecified site: Secondary | ICD-10-CM

## 2023-11-29 NOTE — Telephone Encounter (Signed)
 Estimated Creatinine Clearance: 88.9 mL/min (by C-G formula based on SCr of 0.92 mg/dL).

## 2023-12-01 ENCOUNTER — Other Ambulatory Visit: Payer: Self-pay | Admitting: Nurse Practitioner

## 2023-12-01 DIAGNOSIS — E119 Type 2 diabetes mellitus without complications: Secondary | ICD-10-CM

## 2023-12-20 ENCOUNTER — Other Ambulatory Visit (HOSPITAL_COMMUNITY): Payer: Self-pay

## 2024-01-04 ENCOUNTER — Encounter: Payer: Self-pay | Admitting: Nurse Practitioner

## 2024-01-06 ENCOUNTER — Ambulatory Visit: Payer: Self-pay

## 2024-01-06 NOTE — Telephone Encounter (Signed)
 Appointment scheduled at [facility] due to no openings at PCP office.  FYI Only or Action Required?: FYI only for provider: appointment scheduled on 01/07/2024 at 9:30am with Dr Jenkins Millers at [facility] due to no openings at PCP office .  Patient was last seen in primary care on 11/28/2023 by Elnor Lauraine BRAVO, NP.  Called Nurse Triage reporting Nasal Congestion.  Symptoms began 12/27/2023.  Interventions attempted: OTC medications: dayquil, generic sudafed and Rest, hydration, or home remedies.  Symptoms are: some flu-like symptoms have gotten better but patient states sinuses/congestion worse.  Triage Disposition: See Physician Within 24 Hours  Patient/caregiver understands and will follow disposition?: Yes        Copied from CRM #[id number]. Topic: Clinical - Red Word Triage >> Jan 06, 2024 10:50 AM Mia F wrote: Red Word that prompted transfer to Nurse Triage: Possible sinus infection. congestion, green mucus and cough. No sob Reason for Disposition  Earache  Answer Assessment - Initial Assessment Questions Patient thinks she had the flu Nasal congestion Denies known fevers Thick green mucous when blowing her nose Slight cough in the morning--this clears up by mid day Pressure in ears 12/27/2023 is when flu-like symptoms started Patient's son was seen for and treated for the flu on the 23rd of December. No chance of pregnancy Patient denies chest pain or difficulty breathing.  Patient is advised to call us  back if anything changes or with any further questions/concerns. Patient is advised that if anything worsens to go to the Emergency Room. Patient verbalized understanding.  Protocols used: Sinus Pain or Congestion-A-AH

## 2024-01-07 ENCOUNTER — Ambulatory Visit: Admitting: Family Medicine

## 2024-01-07 ENCOUNTER — Encounter: Payer: Self-pay | Admitting: Family Medicine

## 2024-01-07 VITALS — BP 136/84 | HR 88 | Temp 97.9°F | Ht 62.0 in | Wt 231.0 lb

## 2024-01-07 DIAGNOSIS — J0110 Acute frontal sinusitis, unspecified: Secondary | ICD-10-CM | POA: Diagnosis not present

## 2024-01-07 MED ORDER — CEFDINIR 300 MG PO CAPS: 300.0000 mg | ORAL_CAPSULE | Freq: Two times a day (BID) | ORAL | 0 refills | Status: AC

## 2024-01-07 MED ORDER — FLUCONAZOLE 150 MG PO TABS: 150.0000 mg | ORAL_TABLET | ORAL | 0 refills | Status: AC | PRN

## 2024-01-07 NOTE — Patient Instructions (Signed)
Meds sent to pharm

## 2024-01-07 NOTE — Progress Notes (Signed)
 "  Subjective:     Patient ID: [name], female    DOB: September 27, 1980, 43 y.o.   MRN: [id number]  Chief Complaint  Patient presents with   Cough    Pt has cough, congestion since the 19th and now has thick green mucus     Discussed the use of AI scribe software for clinical note transcription with the patient, who gave verbal consent to proceed.  History of Present Illness [name] is a 43 year old female who presents with persistent sinus congestion and fatigue following flu-like symptoms.  Symptoms began on December 19th with mild nasal congestion, progressing to significant congestion, fatigue, sinus pressure, and cold sweats.  By December 26th, there was some improvement, but congestion, ear popping, and fatigue persisted. The nasal discharge changed from yellow to green and remains thick. Current symptoms include persistent green congestion, ear popping, and reduced fatigue compared to the previous weekend.  No headaches, facial pain, new fevers, or shortness of breath. She experiences a 'post nasal drip type of cough' but no chest pain.  She smokes five to six cigarettes a day. She is allergic to doxycycline and codeine. She has a history of recurrent yeast infections following antibiotic use, requiring a full course of Diflucan for treatment.    Health Maintenance Due  Topic Date Due   Hepatitis B Vaccines 19-59 Average Risk (1 of 3 - 19+ 3-dose series) Never done   HPV VACCINES (1 - Risk 3-dose SCDM series) Never done   Cervical Cancer Screening (HPV/Pap Cotest)  02/13/2015   COVID-19 Vaccine (3 - Moderna risk series) 07/23/2019   Mammogram  09/16/2019    Past Medical History:  Diagnosis Date   Anxiety    Arthritis    Back pain    Bilateral swelling of feet    Chest pain    Cluster headaches    Depression    Eczema    GERD (gastroesophageal reflux disease)    Gestational diabetes 2012, 2016   Hyperlipidemia    Hypertension    no meds currently    Joint pain    Obesity    Osteoarthritis    PCOS (polycystic ovarian syndrome)    Pre-diabetes    Sciatica    Shortness of breath    Sleep apnea    CPAP   Snoring    sleep study 02/2012 with min AHI events   Vitamin D  deficiency     Past Surgical History:  Procedure Laterality Date   CESAREAN SECTION  09/09/2010   x's 2, 2009   CESAREAN SECTION WITH BILATERAL TUBAL LIGATION Bilateral 12/18/2014   Procedure: REPEAT CESAREAN SECTION WITH BILATERAL TUBAL LIGATION;  Surgeon: Ovid All, MD;  Location: [facility];  Service: Obstetrics;  Laterality: Bilateral;   COLPOSCOPY     DENTAL SURGERY     Ganglion removal from (L) wrist Left 01/09/1996   LAPAROSCOPIC UNILATERAL SALPINGECTOMY Left 01/26/2015   Procedure: LAPAROSCOPIC UNILATERAL SALPINGECTOMY;  Surgeon: Ovid All, MD;  Location: [facility];  Service: Gynecology;  Laterality: Left;   WISDOM TOOTH EXTRACTION      Current Medications[1]  Allergies[2] ROS neg/noncontributory except as noted HPI/below      Objective:     BP 136/84 (BP Location: Left Arm, Patient Position: Sitting, Cuff Size: Large)   Pulse 88   Temp 97.9 F (36.6 C) (Temporal)   Ht 5' 2 (1.575 m)   Wt 231 lb (104.8 kg)   LMP 12/17/2023 (Approximate)   SpO2 98%  BMI 42.25 kg/m  Wt Readings from Last 3 Encounters:  01/07/24 231 lb (104.8 kg)  11/28/23 232 lb 2 oz (105.3 kg)  05/03/23 258 lb 8 oz (117.3 kg)    Physical Exam GENERAL: Well developed, well nourished, no acute distress. HEAD EYES EARS NOSE THROAT: Normocephalic, atraumatic. Conjunctiva not injected, sclera nonicteric. Right tympanic membrane gray, no fluid. Left tympanic membrane gray, retracted. Oropharynx clear, moist, no exudates. Right frontal sinus tender to percussion. CARDIAC: Regular rate and rhythm, S1 S2 present, no murmur, NECK: Supple, no thyromegaly, no lymphadenopathy, . LUNGS: Clear to auscultation bilaterally, no wheezes. MUSCULOSKELETAL: No gross  abnormalities. NEUROLOGICAL: Alert and oriented x3, cranial nerves II through XII intact. PSYCHIATRIC: Normal mood, good eye contact.       Assessment & Plan:  Acute non-recurrent frontal sinusitis  Other orders -     Cefdinir; Take 1 capsule (300 mg total) by mouth 2 (two) times daily.  Dispense: 14 capsule; Refill: 0 -     Fluconazole; Take 1 tablet (150 mg total) by mouth every three (3) days as needed.  Dispense: 2 tablet; Refill: 0    Assessment and Plan Assessment & Plan Acute frontal sinusitis   Symptoms began with rhinorrhea and congestion, progressing to fatigue, sinus pressure, and cold sweats. Symptoms improved, but congestion became purulent and thick, with otalgia and fatigue persisting. No fever, headache, or facial pain. Examination reveals right frontal sinus tenderness and left tympanic membrane retraction, likely secondary to a viral upper respiratory infection progressing to bacterial sinusitis. Prescribed Omnicef 300 mg twice daily. Advised to take Diflucan if vaginal pruritus or discharge occurs, and repeat in three days if symptoms persist. Instructed to report if symptoms worsen or do not improve.  Tobacco use   Currently smokes five to six cigarettes per day. Smoking may exacerbate respiratory symptoms and delay recovery from sinusitis. Advised cessation of smoking.     Return if symptoms worsen or fail to improve.  Jenkins CHRISTELLA Carrel, MD     [1]  Current Outpatient Medications:    atorvastatin  (LIPITOR) 20 MG tablet, Take 1 tablet (20 mg total) by mouth daily., Disp: 90 tablet, Rfl: 3   Biotin 5000 MCG CAPS, Take 1 capsule by mouth., Disp: , Rfl:    Blood Glucose Monitoring Suppl DEVI, 1 each by Does not apply route in the morning, at noon, and at bedtime. May substitute to any manufacturer covered by patient's insurance., Disp: 1 each, Rfl: 0   busPIRone  (BUSPAR ) 10 MG tablet, Take 1 tablet (10 mg total) by mouth 3 (three) times daily. For mood control, Disp:  270 tablet, Rfl: 3   cefdinir (OMNICEF) 300 MG capsule, Take 1 capsule (300 mg total) by mouth 2 (two) times daily., Disp: 14 capsule, Rfl: 0   celecoxib  (CELEBREX ) 100 MG capsule, TAKE 1 CAPSULE BY MOUTH TWICE A DAY, Disp: 180 capsule, Rfl: 3   cetirizine  (ZYRTEC ) 10 MG tablet, TAKE 1 TABLET BY MOUTH EVERY DAY, Disp: 90 tablet, Rfl: 0   Cholecalciferol  (VITAMIN D3) 50 MCG (2000 UT) TABS, TAKE 1 TABLET BY MOUTH EVERY DAY, Disp: 90 tablet, Rfl: PRN   docusate sodium  (COLACE) 100 MG capsule, TAKE 1 CAPSULE BY MOUTH EVERY DAY, Disp: 90 capsule, Rfl: 3   fluconazole (DIFLUCAN) 150 MG tablet, Take 1 tablet (150 mg total) by mouth every three (3) days as needed., Disp: 2 tablet, Rfl: 0   FLUoxetine  (PROZAC ) 20 MG capsule, TAKE 3 CAPSULES BY MOUTH EVERY DAY, Disp: 270 capsule, Rfl: 1  JUNEL 1/20 1-20 MG-MCG tablet, Take 1 tablet by mouth daily., Disp: , Rfl:    metFORMIN  (GLUCOPHAGE ) 500 MG tablet, Take 1 tablet (500 mg total) by mouth daily with breakfast. TAKE 1/2 TABLET BY MOUTH DAILY WITH FIRST MEAL FOR 2 WEEKS THEN 1 TABLET DAILY WITH FIRST MEAL, Disp: 90 tablet, Rfl: 1   omeprazole  (PRILOSEC) 40 MG capsule, TAKE 1 CAPSULE BY MOUTH DAILY 30 MINUTES BEFORE BREAKFAST, Disp: , Rfl:    polyethylene glycol powder ([employer] PURELAX) 17 GM/SCOOP powder, DISSOLVE 17 GRAMS INTO WATER AND DRINK BY MOUTH EVERY DAY, Disp: , Rfl:    Semaglutide , 2 MG/DOSE, (OZEMPIC , 2 MG/DOSE,) 8 MG/3ML SOPN, INJECT 2 MG SUBCUTANEOUSLY AS DIRECTED ONCE A WEEK., Disp: 3 mL, Rfl: 6 [2]  Allergies Allergen Reactions   Codeine     STOMACH CRAMPING   Doxycycline Nausea And Vomiting   "

## 2024-01-21 ENCOUNTER — Ambulatory Visit: Admitting: Podiatry

## 2024-01-21 DIAGNOSIS — M79671 Pain in right foot: Secondary | ICD-10-CM | POA: Diagnosis not present

## 2024-01-21 DIAGNOSIS — B351 Tinea unguium: Secondary | ICD-10-CM

## 2024-01-21 DIAGNOSIS — M79672 Pain in left foot: Secondary | ICD-10-CM | POA: Diagnosis not present

## 2024-01-21 MED ORDER — TERBINAFINE HCL 250 MG PO TABS: 250.0000 mg | ORAL_TABLET | Freq: Every day | ORAL | 1 refills | Status: AC

## 2024-01-21 NOTE — Progress Notes (Signed)
" ° °  Subjective:    HPI Presents with complaint of thick painful nails that causes discomfort with  walking and wearing shoes.  Have been this way for many years and have been getting worse.   Objective:  Physical Exam   General: AAO x3, NAD  Vascular: DP and PT pulses palpable bilaterally.  Immedate capillary fill time digits. No significant lower extremity edema bilaterally.  Dermatological:  Onychomycotic mycotic changes nails 1 through 5 with discoloration nail and subungual debris and thickening of the nail with redness along the nail folds.  Tenderness with pressure on nail plates..  Neruologic:  Grossly intact B/L  Musculoskeletal:  Normal lower extremity muscle strength.  Assessment:  Painful onychomycotic nails 1 through 5 bilaterally. Pain feet b/l     Plan:  - Established office visit level 3 for evaluation and management -Discussed with with patient onychomycosis and etiology and treatment.  Discussed topical versus oral agents.  Discussed hallux nail left and the problems and giving her we will try the Lamisil  and she has response to treatment.  If this does not work the nail can always be permanently removed.  Discussed risk and benefits of both.  No history of hepatic or renal disease.  Patient would like to start oral Lamisil  treatment.  Explained that we would check labs every 6 weeks during course of treatment to monitor for any side effects. -Rx: Lamisil  250 mg p.o. daily, refill x 1 -Labs ordered today for liver function tests to monitor for any hepatic side effects from the Lamisil .  Return 6 weeks Lamisil  3  "

## 2024-01-28 ENCOUNTER — Other Ambulatory Visit: Payer: Self-pay | Admitting: Medical Genetics

## 2024-01-28 DIAGNOSIS — Z006 Encounter for examination for normal comparison and control in clinical research program: Secondary | ICD-10-CM

## 2024-01-29 ENCOUNTER — Other Ambulatory Visit: Payer: Self-pay | Admitting: Podiatry

## 2024-01-30 LAB — HEPATIC FUNCTION PANEL
ALT: 16 IU/L (ref 0–32)
AST: 11 IU/L (ref 0–40)
Albumin: 4.1 g/dL (ref 3.9–4.9)
Alkaline Phosphatase: 83 IU/L (ref 41–116)
Bilirubin Total: 0.4 mg/dL (ref 0.0–1.2)
Bilirubin, Direct: 0.14 mg/dL (ref 0.00–0.40)
Total Protein: 6.4 g/dL (ref 6.0–8.5)

## 2024-03-03 ENCOUNTER — Ambulatory Visit: Admitting: Podiatry

## 2024-03-04 ENCOUNTER — Ambulatory Visit: Admitting: Podiatry

## 2024-03-10 ENCOUNTER — Ambulatory Visit: Admitting: Adult Health

## 2024-03-23 ENCOUNTER — Ambulatory Visit: Admitting: Adult Health

## 2024-12-10 ENCOUNTER — Encounter: Admitting: Nurse Practitioner
